# Patient Record
Sex: Female | Born: 1935 | Race: White | Hispanic: No | Smoking: Former smoker
Health system: Southern US, Community
[De-identification: ages and names within clinical notes are randomized; demographics above are authoritative.]

## PROBLEM LIST (undated history)

## (undated) ENCOUNTER — Emergency Department (HOSPITAL_COMMUNITY): Discharge status: Absent without leave | Payer: Self-pay

## (undated) ENCOUNTER — Emergency Department (HOSPITAL_COMMUNITY): Disposition: A | Discharge status: Home / Self Care | Payer: Self-pay

## (undated) DIAGNOSIS — J189 Pneumonia, unspecified organism: Secondary | ICD-10-CM

## (undated) DIAGNOSIS — M8580 Other specified disorders of bone density and structure, unspecified site: Secondary | ICD-10-CM

## (undated) DIAGNOSIS — D649 Anemia, unspecified: Secondary | ICD-10-CM

## (undated) DIAGNOSIS — I10 Essential (primary) hypertension: Secondary | ICD-10-CM

## (undated) DIAGNOSIS — E1151 Type 2 diabetes mellitus with diabetic peripheral angiopathy without gangrene: Secondary | ICD-10-CM

## (undated) DIAGNOSIS — I739 Peripheral vascular disease, unspecified: Secondary | ICD-10-CM

## (undated) DIAGNOSIS — M1990 Unspecified osteoarthritis, unspecified site: Secondary | ICD-10-CM

## (undated) DIAGNOSIS — E1165 Type 2 diabetes mellitus with hyperglycemia: Secondary | ICD-10-CM

## (undated) DIAGNOSIS — R0602 Shortness of breath: Secondary | ICD-10-CM

## (undated) DIAGNOSIS — K219 Gastro-esophageal reflux disease without esophagitis: Secondary | ICD-10-CM

## (undated) DIAGNOSIS — W19XXXA Unspecified fall, initial encounter: Secondary | ICD-10-CM

## (undated) DIAGNOSIS — Y92009 Unspecified place in unspecified non-institutional (private) residence as the place of occurrence of the external cause: Secondary | ICD-10-CM

## (undated) DIAGNOSIS — K759 Inflammatory liver disease, unspecified: Secondary | ICD-10-CM

## (undated) DIAGNOSIS — I509 Heart failure, unspecified: Secondary | ICD-10-CM

## (undated) HISTORY — DX: Anemia, unspecified: D64.9

## (undated) HISTORY — DX: Peripheral vascular disease, unspecified: I73.9

## (undated) HISTORY — DX: Unspecified fall, initial encounter: W19.XXXA

## (undated) HISTORY — DX: Other specified disorders of bone density and structure, unspecified site: M85.80

## (undated) HISTORY — DX: Gastro-esophageal reflux disease without esophagitis: K21.9

## (undated) HISTORY — DX: Unspecified osteoarthritis, unspecified site: M19.90

## (undated) HISTORY — DX: Unspecified place in unspecified non-institutional (private) residence as the place of occurrence of the external cause: Y92.009

## (undated) SURGERY — Surgical Case
Anesthesia: *Unknown

---

## 1992-06-27 HISTORY — PX: APPENDECTOMY: SHX54

## 2000-06-12 ENCOUNTER — Encounter: Admission: RE | Admit: 2000-06-12 | Discharge: 2000-06-12 | Payer: Self-pay | Admitting: Pulmonary Disease

## 2001-05-07 ENCOUNTER — Other Ambulatory Visit: Admission: RE | Admit: 2001-05-07 | Discharge: 2001-05-07 | Payer: Self-pay | Admitting: Obstetrics and Gynecology

## 2001-05-10 ENCOUNTER — Ambulatory Visit (HOSPITAL_COMMUNITY): Admission: RE | Admit: 2001-05-10 | Discharge: 2001-05-10 | Payer: Self-pay | Admitting: Obstetrics and Gynecology

## 2001-05-10 ENCOUNTER — Encounter: Payer: Self-pay | Admitting: Obstetrics and Gynecology

## 2001-05-18 ENCOUNTER — Encounter: Payer: Self-pay | Admitting: Obstetrics and Gynecology

## 2001-05-18 ENCOUNTER — Ambulatory Visit (HOSPITAL_COMMUNITY): Admission: RE | Admit: 2001-05-18 | Discharge: 2001-05-18 | Payer: Self-pay | Admitting: Obstetrics and Gynecology

## 2001-10-08 ENCOUNTER — Ambulatory Visit (HOSPITAL_COMMUNITY): Admission: RE | Admit: 2001-10-08 | Discharge: 2001-10-08 | Payer: Self-pay | Admitting: Orthopaedic Surgery

## 2001-10-08 ENCOUNTER — Encounter: Payer: Self-pay | Admitting: Orthopaedic Surgery

## 2001-10-18 ENCOUNTER — Other Ambulatory Visit: Admission: RE | Admit: 2001-10-18 | Discharge: 2001-10-18 | Payer: Self-pay | Admitting: Dermatology

## 2001-11-22 ENCOUNTER — Ambulatory Visit (HOSPITAL_COMMUNITY): Admission: RE | Admit: 2001-11-22 | Discharge: 2001-11-22 | Payer: Self-pay | Admitting: Pulmonary Disease

## 2002-05-13 ENCOUNTER — Ambulatory Visit (HOSPITAL_COMMUNITY): Admission: RE | Admit: 2002-05-13 | Discharge: 2002-05-13 | Payer: Self-pay | Admitting: Obstetrics and Gynecology

## 2002-05-13 ENCOUNTER — Encounter: Payer: Self-pay | Admitting: Obstetrics and Gynecology

## 2003-05-16 ENCOUNTER — Ambulatory Visit (HOSPITAL_COMMUNITY): Admission: RE | Admit: 2003-05-16 | Discharge: 2003-05-16 | Payer: Self-pay | Admitting: Internal Medicine

## 2003-07-22 ENCOUNTER — Ambulatory Visit (HOSPITAL_COMMUNITY): Admission: RE | Admit: 2003-07-22 | Discharge: 2003-07-22 | Payer: Self-pay | Admitting: Orthopaedic Surgery

## 2004-04-20 ENCOUNTER — Other Ambulatory Visit: Admission: RE | Admit: 2004-04-20 | Discharge: 2004-04-20 | Payer: Self-pay | Admitting: Ophthalmology

## 2004-06-18 ENCOUNTER — Ambulatory Visit (HOSPITAL_COMMUNITY): Admission: RE | Admit: 2004-06-18 | Discharge: 2004-06-18 | Payer: Self-pay | Admitting: Obstetrics and Gynecology

## 2005-02-23 ENCOUNTER — Ambulatory Visit (HOSPITAL_COMMUNITY): Admission: RE | Admit: 2005-02-23 | Discharge: 2005-02-23 | Payer: Self-pay

## 2005-06-29 ENCOUNTER — Ambulatory Visit (HOSPITAL_COMMUNITY): Admission: RE | Admit: 2005-06-29 | Discharge: 2005-06-29 | Payer: Self-pay | Admitting: Obstetrics and Gynecology

## 2006-01-09 ENCOUNTER — Ambulatory Visit (HOSPITAL_COMMUNITY): Admission: RE | Admit: 2006-01-09 | Discharge: 2006-01-09 | Payer: Self-pay | Admitting: *Deleted

## 2006-06-27 HISTORY — PX: EYE SURGERY: SHX253

## 2006-08-31 ENCOUNTER — Ambulatory Visit (HOSPITAL_COMMUNITY): Admission: RE | Admit: 2006-08-31 | Discharge: 2006-08-31 | Payer: Self-pay | Admitting: Obstetrics and Gynecology

## 2007-07-16 ENCOUNTER — Ambulatory Visit (HOSPITAL_COMMUNITY): Admission: RE | Admit: 2007-07-16 | Discharge: 2007-07-16 | Payer: Self-pay | Admitting: Ophthalmology

## 2007-07-23 ENCOUNTER — Other Ambulatory Visit: Admission: RE | Admit: 2007-07-23 | Discharge: 2007-07-23 | Payer: Self-pay | Admitting: Obstetrics and Gynecology

## 2009-02-23 ENCOUNTER — Ambulatory Visit (HOSPITAL_COMMUNITY): Admission: RE | Admit: 2009-02-23 | Discharge: 2009-02-23 | Payer: Self-pay | Admitting: Ophthalmology

## 2009-10-30 ENCOUNTER — Other Ambulatory Visit: Admission: RE | Admit: 2009-10-30 | Discharge: 2009-10-30 | Payer: Self-pay | Admitting: Obstetrics and Gynecology

## 2010-07-18 ENCOUNTER — Encounter: Payer: Self-pay | Admitting: Obstetrics and Gynecology

## 2010-09-30 ENCOUNTER — Other Ambulatory Visit (HOSPITAL_COMMUNITY): Payer: Self-pay | Admitting: Pulmonary Disease

## 2010-09-30 ENCOUNTER — Ambulatory Visit (HOSPITAL_COMMUNITY)
Admission: RE | Admit: 2010-09-30 | Discharge: 2010-09-30 | Disposition: A | Discharge status: Home / Self Care | Payer: Medicare Other | Source: Ambulatory Visit | Attending: Pulmonary Disease | Admitting: Pulmonary Disease

## 2010-09-30 DIAGNOSIS — R9389 Abnormal findings on diagnostic imaging of other specified body structures: Secondary | ICD-10-CM | POA: Insufficient documentation

## 2010-09-30 DIAGNOSIS — R1032 Left lower quadrant pain: Secondary | ICD-10-CM | POA: Insufficient documentation

## 2010-09-30 DIAGNOSIS — R109 Unspecified abdominal pain: Secondary | ICD-10-CM | POA: Insufficient documentation

## 2010-09-30 LAB — CREATININE, SERUM
Creatinine, Ser: 1.06 mg/dL (ref 0.4–1.2)
GFR calc Af Amer: >60 mL/min (ref >60)
GFR calc non Af Amer: 50 mL/min — ABNORMAL LOW (ref >60)

## 2010-09-30 MED ORDER — IOHEXOL 300 MG/ML  SOLN
100.0000 mL | Freq: Once | INTRAMUSCULAR | Status: AC | PRN
  Administered 2010-09-30: 100 mL via INTRAVENOUS

## 2010-10-01 ENCOUNTER — Other Ambulatory Visit (HOSPITAL_COMMUNITY): Payer: Self-pay

## 2010-10-02 LAB — HEMOGLOBIN AND HEMATOCRIT, BLOOD
HCT: 38.8 % (ref 36.0–46.0)
Hemoglobin: 13.6 g/dL (ref 12.0–15.0)

## 2010-10-02 LAB — BASIC METABOLIC PANEL
BUN: 17 mg/dL (ref 6–23)
CO2: 26 mEq/L (ref 19–32)
Calcium: 10.1 mg/dL (ref 8.4–10.5)
Chloride: 105 mEq/L (ref 96–112)
Creatinine, Ser: 0.87 mg/dL (ref 0.4–1.2)
GFR calc Af Amer: >60 mL/min (ref >60)
GFR calc non Af Amer: >60 mL/min (ref >60)
Glucose, Bld: 130 mg/dL — ABNORMAL HIGH (ref 70–99)
Potassium: 4.8 mEq/L (ref 3.5–5.1)
Sodium: 140 mEq/L (ref 135–145)

## 2010-10-02 LAB — GLUCOSE, CAPILLARY: Glucose-Capillary: 94 mg/dL (ref 70–99)

## 2010-12-02 ENCOUNTER — Other Ambulatory Visit (HOSPITAL_COMMUNITY): Payer: Self-pay | Admitting: Pulmonary Disease

## 2010-12-02 DIAGNOSIS — Z780 Asymptomatic menopausal state: Secondary | ICD-10-CM

## 2010-12-02 DIAGNOSIS — I739 Peripheral vascular disease, unspecified: Secondary | ICD-10-CM

## 2010-12-07 ENCOUNTER — Ambulatory Visit (HOSPITAL_COMMUNITY): Payer: Medicare Other

## 2010-12-07 ENCOUNTER — Ambulatory Visit (HOSPITAL_COMMUNITY)
Admission: RE | Admit: 2010-12-07 | Discharge: 2010-12-07 | Disposition: A | Discharge status: Home / Self Care | Payer: Medicare Other | Source: Ambulatory Visit | Attending: Pulmonary Disease | Admitting: Pulmonary Disease

## 2010-12-07 ENCOUNTER — Encounter (HOSPITAL_COMMUNITY): Payer: Self-pay

## 2010-12-07 DIAGNOSIS — Z780 Asymptomatic menopausal state: Secondary | ICD-10-CM

## 2010-12-07 DIAGNOSIS — M818 Other osteoporosis without current pathological fracture: Secondary | ICD-10-CM | POA: Insufficient documentation

## 2010-12-07 HISTORY — DX: Essential (primary) hypertension: I10

## 2010-12-13 ENCOUNTER — Ambulatory Visit (HOSPITAL_COMMUNITY)
Admission: RE | Admit: 2010-12-13 | Discharge: 2010-12-13 | Disposition: A | Discharge status: Home / Self Care | Payer: Medicare Other | Source: Ambulatory Visit | Attending: Pulmonary Disease | Admitting: Pulmonary Disease

## 2010-12-13 DIAGNOSIS — I743 Embolism and thrombosis of arteries of the lower extremities: Secondary | ICD-10-CM | POA: Insufficient documentation

## 2010-12-13 DIAGNOSIS — I739 Peripheral vascular disease, unspecified: Secondary | ICD-10-CM

## 2011-01-05 NOTE — Progress Notes (Signed)
[facility] [place]  Referred by: Dr. Kari Baars  Reason for referral: Possible L leg PAD  History of Present Illness  Tina [name] is a 77 y.o. female who presents with chief complaint: bilateral upper leg pain.  Onset of symptom occurred last few months.  Pain is described as aching in thighs, severity 2-4/10, and associated with ambulation and back pain.  Patient has attempted to treat this pain with OTC and rest.  The patient has no rest pain symptoms also and no leg wounds/ulcers.  She does not endorse claudication symptoms. Atherosclerotic risk factors include: DM, HTN, and Obesity.  Past Medical History  Diagnosis Date  . Diabetes mellitus   . Hypertension   . Osteopenia   . Peripheral vascular disease, unspecified   . GERD (gastroesophageal reflux disease)   . Arthritis     Past Surgical History  Procedure Date  . Appendectomy   . Eye surgery     History   Social History  . Marital Status: Widowed    Spouse Name: N/A    Number of Children: N/A  . Years of Education: N/A   Occupational History  . Not on file.   Social History Main Topics  . Smoking status: Former Smoker    Types: Cigarettes    Quit date: 01/06/1961  . Smokeless tobacco: Never Used  . Alcohol Use: 4.4 oz/week    4 Glasses of wine per week  . Drug Use: Not on file  . Sexually Active: Not on file   Other Topics Concern  . Not on file   Social History Narrative  . No narrative on file    Family History  Problem Relation Age of Onset  . Cancer Mother   . Heart disease Father     Current outpatient prescriptions:amLODipine (NORVASC) 10 MG tablet, Take 10 mg by mouth daily.  , Disp: , Rfl: ;  aspirin 325 MG tablet, Take 325 mg by mouth daily.  , Disp: , Rfl: ;  calcium-vitamin D (OSCAL WITH D) 500-200 MG-UNIT per tablet, Take 1 tablet by mouth daily.  , Disp: , Rfl: ;  colesevelam (WELCHOL) 625 MG tablet, Take 1,875 mg by mouth 2 (two) times daily with a meal.   , Disp: , Rfl:  ezetimibe (ZETIA) 10 MG tablet, Take 10 mg by mouth daily.  , Disp: , Rfl: ;  fish oil-omega-3 fatty acids 1000 MG capsule, Take 1 g by mouth daily.  , Disp: , Rfl: ;  lisinopril (PRINIVIL,ZESTRIL) 10 MG tablet, Take 10 mg by mouth daily.  , Disp: , Rfl: ;  Nutritional Supplements (NUTRITIONAL SUPPLEMENT PO), Take by mouth 2 (two) times daily.  , Disp: , Rfl: ;  omeprazole (PRILOSEC) 40 MG capsule, Take 40 mg by mouth daily.  , Disp: , Rfl:  pioglitazone-glimepiride (DUETACT) 30-4 MG per tablet, Take 1 tablet by mouth daily with breakfast.  , Disp: , Rfl: ;  rosuvastatin (CRESTOR) 20 MG tablet, Take 20 mg by mouth daily.  , Disp: , Rfl: ;  sitaGLIPtin (JANUVIA) 100 MG tablet, Take 100 mg by mouth daily.  , Disp: , Rfl: ;  traMADol (ULTRAM) 50 MG tablet, Take 50 mg by mouth every 6 (six) hours as needed.  , Disp: , Rfl:   Allergies as of 01/07/2011  . (Not on File)    Review of Systems (Positive items in bold and italic, otherwise negative)  General: Weight loss, Weight gain, Loss of appetite, Fever  Neurologic: Dizziness,  Blackouts, Headaches, Seizure  Ear/Nose/Throat: Change in eyesight, Change in hearing, Nose bleeds, Sore throat  Vascular: Pain in legs with walking, Pain in feet while lying flat, Non-healing ulcer, Stroke, "Mini stroke", Slurred speech, Temporary blindness, Blood clot in vein, Phlebitis  Pulmonary: Home oxygen, Productive cough, Bronchitis, Coughing up blood, Asthma, Wheezing  Musculoskeletal: Arthritis, Joint pain, Muscle pain  Cardiac: Chest pain, Chest tightness/pressure, Shortness of breath when lying flat, Palpitations, Heart murmur, Arrythmia, Atrial fibrillation  Hematologic: Bleeding problems, Clotting disorder, Anemia  Psychiatric:  Depression, Anxiety, Attention deficit disorder  Gastrointestinal:  Black stool, Blood in stool, Peptic ulcer disease, Reflux, Hiatal hernia, Trouble swallowing, Diarrhea, Constipation  Urinary:  Kidney disease,  Burning with urination, Frequent urination, Difficulty urinating  Skin: Ulcers, Rashes, Warts on feet  Physical Examination  Filed Vitals:   01/07/11 1511  BP: 128/80  Pulse: 91  Resp: 20    General: A&O x 3, WDWN, morbidly obese  Head: [place]/AT  Ear/Nose/Throat: Hearing grossly intact, nares w/o erythema or drainage, oropharynx w/o Erythema/Exudate  Eyes: PERRLA, EOMI  Neck: Supple, no nuchal rigidity, no palpable LAD  Pulmonary: Sym exp, good air movt, CTAB, no rales, rhonchi, & wheezing  Cardiac: RRR, Nl S1, S2, no Murmurs, rubs or gallops  Vascular: Vessel Right Left  Radial Palpable Palpable  Ulnary Palpable Palpable  Brachial Palpable Palpable  Carotid Palpable, without bruit Palpable, without bruit  Aorta Non-palpable N/A  Femoral Palpable Palpable  Popliteal Non-palpable Non-palpable  PT Non-Palpable Non-Palpable  DP Palpable Palpable   Gastrointestinal: soft, NTND, -G/R, - HSM, - masses, - CVAT B, morbidly obese  Musculoskeletal: M/S 5/5 throughout, Extremities without ischemic changes, distribution of fat c/w lipedema, no Kaposi-Stemmer sign  Neurologic: CN 2-12 intact, Pain and light touch intact in extremities, Motor exam as listed above  Psychiatric: Judgment intact, Mood & affect appropriatefor pt's clinical situation  Dermatologic: See M/S exam for extremity exam, no rashes otherwise noted  Lymph : No Cervical, Axillary, or Inguinal lymphadenopathy   Non-Invasive Vascular Imaging ABI will be ordered  Outside Studies/Documentation 15 pages of outside documents were reviewed including: BLE arterial waveforms..  Medical Decision Making  Tina [name] is a 77 y.o. female who presents with: B leg pain with back pain not consistent with vascular claudication.   In discussing the previus ABI and physiologic studies, I suspect the previous studies may be of limited value due to the inexperience of the operator subsequently I will be repeating the  studies  Also the previous waveforms on those studies are normal which is more consistent with the findings on exam  I discussed in depth with the patient the nature of atherosclerosis, and emphasized the importance of maximal medical management including strict control of blood pressure, blood glucose, and lipid levels, obtaining regular exercise, and cessation of smoking.  The patient is aware that without maximal medical management the underlying atherosclerotic disease process will progress, limiting the benefit of any interventions.  The patient will follow up in 2-4 weeks with the following studies: BLE ABI with waveforms.    Thank you for allowing [place] to participate in this patient's care.  Leonides Sake, MD [facility] [place] Office: [phone] Pager: [phone]

## 2011-01-07 ENCOUNTER — Encounter: Payer: Self-pay | Admitting: Vascular Surgery

## 2011-01-07 ENCOUNTER — Ambulatory Visit (INDEPENDENT_AMBULATORY_CARE_PROVIDER_SITE_OTHER): Payer: Medicare Other | Admitting: Vascular Surgery

## 2011-01-07 DIAGNOSIS — E669 Obesity, unspecified: Secondary | ICD-10-CM

## 2011-01-07 DIAGNOSIS — M79604 Pain in right leg: Secondary | ICD-10-CM | POA: Insufficient documentation

## 2011-01-07 DIAGNOSIS — M79609 Pain in unspecified limb: Secondary | ICD-10-CM

## 2011-01-07 DIAGNOSIS — M79605 Pain in left leg: Secondary | ICD-10-CM | POA: Insufficient documentation

## 2011-01-07 NOTE — Progress Notes (Signed)
New  PAD

## 2011-01-26 NOTE — Progress Notes (Addendum)
[facility]   Established Intermittent Claudication  History of Present Illness  Tina [name] is a 77 y.o. female who presents with chief complaint: follow on ABI.  The patient's symptoms have not progressed.  The patient's symptoms are: unchanged from previous: thigh aching not consistent with PAD.   The patient's treatment regimen currently included: maximal medical management.  Past Medical History, Past Surgical History, Social History, Family History, Medications, Allergies, and Review of Systems are unchanged from previous evaluation on 01/07/11.  Physical Examination  Filed Vitals:   01/28/11 1534  BP: 138/75  Pulse: 87   General: A&O x 3, WDWN, morbidly obese   Pulmonary: Sym exp, good air movt, CTAB, no rales, rhonchi, & wheezing   Cardiac: RRR, Nl S1, S2, no Murmurs, rubs or gallops   Vascular:  Vessel  Right  Left   Radial  Palpable  Palpable   Ulnary  Palpable  Palpable   Brachial  Palpable  Palpable   Carotid  Palpable, without bruit  Palpable, without bruit   Aorta  Non-palpable  N/A   Femoral  Palpable  Palpable   Popliteal  Non-palpable  Non-palpable   PT  Non-Palpable  Non-Palpable   DP  Palpable  Palpable    Gastrointestinal: soft, NTND, -G/R, - HSM, - masses, - CVAT B, morbidly obese   Musculoskeletal: M/S 5/5 throughout, Extremities without ischemic changes,  Neurologic: Pain and light touch intact in extremities, Motor exam as listed above  Non-Invasive Vascular Imaging ABI (Date: 01/28/11)  RLE: 1.29, Triphasic Pt, Biphasic AT  LLE: 1.26, Biphasic PT, Bi[hasic AT  Medical Decision Making  Tina [name] is a 77 y.o. female who presents with: B thigh pain and ABI not consistent with PAD  Based on the patient's vascular studies and examination, I suspect this patient's pain is not vasogenic in origin  However, she is at very high risk for developing PAD.  In fact, the ABI > 1.0 may represent some degree of  medial calcification in both legs.  I recommend annual ABI to interrogate both legs.  She will follow up with [place] annually to obtain such.  I discussed in depth with the patient the nature of atherosclerosis, and emphasized the importance of maximal medical management including strict control of blood pressure, blood glucose, and lipid levels, obtaining regular exercise, and cessation of smoking.  The patient is aware that without maximal medical management the underlying atherosclerotic disease process will progress, limiting the benefit of any interventions.  Thank you for allowing [place] to participate in this patient's care.  Leonides Sake, MD [facility] [place] Office: [phone] Pager: [phone]

## 2011-01-28 ENCOUNTER — Encounter (INDEPENDENT_AMBULATORY_CARE_PROVIDER_SITE_OTHER): Payer: Medicare Other

## 2011-01-28 ENCOUNTER — Ambulatory Visit (INDEPENDENT_AMBULATORY_CARE_PROVIDER_SITE_OTHER): Payer: Medicare Other | Admitting: Vascular Surgery

## 2011-01-28 ENCOUNTER — Encounter: Payer: Self-pay | Admitting: Vascular Surgery

## 2011-01-28 VITALS — BP 138/75 | HR 87 | Ht 66.0 in | Wt 240.0 lb

## 2011-01-28 DIAGNOSIS — M79609 Pain in unspecified limb: Secondary | ICD-10-CM

## 2011-01-28 DIAGNOSIS — M79604 Pain in right leg: Secondary | ICD-10-CM

## 2011-03-17 LAB — HEMOGLOBIN AND HEMATOCRIT, BLOOD
HCT: 41.2
Hemoglobin: 13.7

## 2011-03-17 LAB — BASIC METABOLIC PANEL
BUN: 15
CO2: 23
Calcium: 9.8
Chloride: 107
Creatinine, Ser: 0.86
GFR calc Af Amer: >60
GFR calc non Af Amer: >60
Glucose, Bld: 150 — ABNORMAL HIGH
Potassium: 5
Sodium: 140

## 2012-02-02 ENCOUNTER — Encounter: Payer: Self-pay | Admitting: Vascular Surgery

## 2012-02-03 ENCOUNTER — Ambulatory Visit: Payer: Medicare Other | Admitting: Vascular Surgery

## 2012-02-16 ENCOUNTER — Other Ambulatory Visit: Payer: Self-pay | Admitting: *Deleted

## 2012-02-16 ENCOUNTER — Encounter: Payer: Self-pay | Admitting: Vascular Surgery

## 2012-02-16 DIAGNOSIS — I70219 Atherosclerosis of native arteries of extremities with intermittent claudication, unspecified extremity: Secondary | ICD-10-CM

## 2012-02-17 ENCOUNTER — Ambulatory Visit (INDEPENDENT_AMBULATORY_CARE_PROVIDER_SITE_OTHER): Payer: Medicare Other | Admitting: Vascular Surgery

## 2012-02-17 ENCOUNTER — Encounter: Payer: Self-pay | Admitting: Vascular Surgery

## 2012-02-17 VITALS — BP 125/61 | HR 97 | Resp 16 | Ht 63.5 in | Wt 247.0 lb

## 2012-02-17 DIAGNOSIS — I70219 Atherosclerosis of native arteries of extremities with intermittent claudication, unspecified extremity: Secondary | ICD-10-CM

## 2012-02-17 DIAGNOSIS — I739 Peripheral vascular disease, unspecified: Secondary | ICD-10-CM

## 2012-02-17 DIAGNOSIS — M79609 Pain in unspecified limb: Secondary | ICD-10-CM | POA: Insufficient documentation

## 2012-02-17 NOTE — Progress Notes (Signed)
Ankle and toe brachial indices performed @ [facility] 02/17/2012

## 2012-02-17 NOTE — Progress Notes (Signed)
[facility] [place]  Established Intermittent Claudication  History of Present Illness  Tina [name] is a 78 y.o. (03-May-1931) female who presents with chief complaint: back pain.  The patient's symptoms previous thigh aching have improved after spinal injections.  The patient's symptoms are: aching in legs with ambulation.  This is improved with exercise in water.  The patient's treatment regimen currently included: maximal medical management.  Past Medical History, Past Surgical History, Social History, Family History, Medications, Allergies, and Review of Systems are unchanged from previous evaluation on 01/28/11.  Physical Examination  Filed Vitals:   02/17/12 1518  BP: 125/61  Pulse: 97  Resp: 16  Height: 5' 3.5" (1.613 m)  Weight: 247 lb (112.038 kg)  SpO2: 99%   Body mass index is 43.07 kg/(m^2).  General: A&O x 3, WDWN, morbidly obese  Pulmonary: Sym exp, good air movt, CTAB, no rales, rhonchi, & wheezing  Cardiac: RRR, Nl S1, S2, no Murmurs, rubs or gallops  Vascular: Vessel Right Left  Radial Palpable Palpable  Ulnar Palpable Palpable  Brachial Palpable Palpable  Carotid Palpable, without bruit Palpable, without bruit  Aorta Not palpable due to obesity N/A  Femoral Palpable Palpable  Popliteal Not palpable Not palpable  PT Faintly Palpable Faintly Palpable  DP Not Palpable Not Palpable   Musculoskeletal: M/S 5/5 throughout , Extremities without ischemic changes , B spider veins and varicosities, some darkened areas on plantar surface not c/w ischemia  Neurologic: Pain and light touch intact in extremities , Motor exam as listed above  Non-Invasive Vascular Imaging ABI (Date: 02/17/12)  RLE: 1.04, PT: biphasic, DP: triphasic, DBI: 0.51  LLE: 0.99, PT: biphasic, DP: monophasic, DBI: 0.51  Medical Decision Making  Tina [name] is a 78 y.o. female who presents with: BLE PAD.  I suspect that this patient's musculoskeletal  limitation probably prevent her from manifesting any intermittent claudication.  The ABI likely reflect some degree of medial calcification.  Strangely her waveforms are completely incongruent with her ABI.  Her right leg waveforms appear relatively normal though the DP on has a ABI of 0.52.  The waveforms in the L leg are consistent with at least moderate PAD.  In this patient with non-lifestyle limiting PAD, it is hard to justify pursuing a diagnostic angiogram.  I would continue with maximal medical management.   I also discussed with her modifying the walking plan concept for an aqua aerobics: exercising until she became sx and then recovering and then continuing.  I discussed in depth with the patient the nature of atherosclerosis, and emphasized the importance of maximal medical management including strict control of blood pressure, blood glucose, and lipid levels, antiplatelet agents, obtaining regular exercise, and cessation of smoking.  The patient is aware that without maximal medical management the underlying atherosclerotic disease process will progress, limiting the benefit of any interventions.  She will follow up in 6 months with repeat ABI.  Thank you for allowing [place] to participate in this patient's care.  Leonides Sake, MD [facility] [place] Office: [phone] Pager: [phone]  02/17/2012, 4:45 PM

## 2012-08-16 ENCOUNTER — Encounter: Payer: Self-pay | Admitting: Neurosurgery

## 2012-08-17 ENCOUNTER — Ambulatory Visit: Payer: Medicare Other | Admitting: Neurosurgery

## 2012-08-23 ENCOUNTER — Encounter: Payer: Self-pay | Admitting: Neurosurgery

## 2012-08-24 ENCOUNTER — Ambulatory Visit: Payer: Medicare Other | Admitting: Vascular Surgery

## 2012-08-24 ENCOUNTER — Ambulatory Visit (INDEPENDENT_AMBULATORY_CARE_PROVIDER_SITE_OTHER): Payer: Medicare Other | Admitting: Neurosurgery

## 2012-08-24 ENCOUNTER — Encounter: Payer: Self-pay | Admitting: Neurosurgery

## 2012-08-24 ENCOUNTER — Other Ambulatory Visit: Payer: Self-pay | Admitting: *Deleted

## 2012-08-24 ENCOUNTER — Encounter (INDEPENDENT_AMBULATORY_CARE_PROVIDER_SITE_OTHER): Payer: Medicare Other | Admitting: Vascular Surgery

## 2012-08-24 VITALS — BP 145/72 | HR 86 | Resp 16 | Ht 63.5 in | Wt 240.0 lb

## 2012-08-24 DIAGNOSIS — I70219 Atherosclerosis of native arteries of extremities with intermittent claudication, unspecified extremity: Secondary | ICD-10-CM

## 2012-08-24 NOTE — Progress Notes (Signed)
[facility] [place] PAD/PVD Office Note  CC: PAD surveillance Referring Physician: Imogene Burn  History of Present Illness: 79 year old female patient of Dr. Imogene Burn with no history of lower extremity intervention. The patient denies claudication or rest pain. The patient states she does have lumbar pain which limits her activity.  Past Medical History  Diagnosis Date  . Diabetes mellitus   . Hypertension   . Osteopenia   . Peripheral vascular disease, unspecified   . GERD (gastroesophageal reflux disease)   . Arthritis   . Anemia     ROS: [x]  Positive   [ ]  Denies    General: [ ]  Weight loss, [ ]  Fever, [ ]  chills Neurologic: [ ]  Dizziness, [ ]  Blackouts, [ ]  Seizure [ ]  Stroke, [ ]  "Mini stroke", [ ]  Slurred speech, [ ]  Temporary blindness; [ ]  weakness in arms or legs, [ ]  Hoarseness Cardiac: [ ]  Chest pain/pressure, [ ]  Shortness of breath at rest [ ]  Shortness of breath with exertion, [ ]  Atrial fibrillation or irregular heartbeat Vascular: [ ]  Pain in legs with walking, [ ]  Pain in legs at rest, [ ]  Pain in legs at night,  [ ]  Non-healing ulcer, [ ]  Blood clot in vein/DVT,   Pulmonary: [ ]  Home oxygen, [ ]  Productive cough, [ ]  Coughing up blood, [ ]  Asthma,  [ ]  Wheezing Musculoskeletal:  [ ]  Arthritis, [ ]  Low back pain, [ ]  Joint pain Hematologic: [ ]  Easy Bruising, [ ]  Anemia; [ ]  Hepatitis Gastrointestinal: [ ]  Blood in stool, [ ]  Gastroesophageal Reflux/heartburn, [ ]  Trouble swallowing Urinary: [ ]  chronic Kidney disease, [ ]  on HD - [ ]  MWF or [ ]  TTHS, [ ]  Burning with urination, [ ]  Difficulty urinating Skin: [ ]  Rashes, [ ]  Wounds Psychological: [ ]  Anxiety, [ ]  Depression   Social History History  Substance Use Topics  . Smoking status: Former Smoker    Types: Cigarettes    Quit date: 01/06/1961  . Smokeless tobacco: Never Used  . Alcohol Use: 4.4 oz/week    4 Glasses of wine per week    Family History Family History  Problem Relation  Age of Onset  . Cancer Mother   . Hypertension Mother   . Heart disease Father     before age 50  . Heart attack Father   . Hyperlipidemia Son     No Known Allergies  Current Outpatient Prescriptions  Medication Sig Dispense Refill  . amLODipine (NORVASC) 10 MG tablet Take 10 mg by mouth daily.        Marland Kitchen aspirin 325 MG tablet Take 325 mg by mouth daily.        . calcium carbonate (OS-CAL - DOSED IN MG OF ELEMENTAL CALCIUM) 1250 MG tablet Take 1 tablet by mouth daily.      . calcium-vitamin D (OSCAL WITH D) 500-200 MG-UNIT per tablet Take 1 tablet by mouth daily.        . Cholecalciferol (VITAMIN D PO) Take by mouth daily.      Marland Kitchen CINNAMON PO Take by mouth daily.        . colesevelam (WELCHOL) 625 MG tablet Take 1,875 mg by mouth 2 (two) times daily with a meal.        . ezetimibe (ZETIA) 10 MG tablet Take 10 mg by mouth daily.        . fish oil-omega-3 fatty acids 1000 MG capsule Take 1 g by mouth daily.        Marland Kitchen  lisinopril (PRINIVIL,ZESTRIL) 10 MG tablet Take 10 mg by mouth daily.        . Nutritional Supplements (NUTRITIONAL SUPPLEMENT PO) Take by mouth 2 (two) times daily.        Marland Kitchen omeprazole (PRILOSEC) 40 MG capsule Take 40 mg by mouth daily.        . pioglitazone-glimepiride (DUETACT) 30-4 MG per tablet Take 1 tablet by mouth daily with breakfast.        . rosuvastatin (CRESTOR) 20 MG tablet Take 20 mg by mouth daily.        . sitaGLIPtin (JANUVIA) 100 MG tablet Take 100 mg by mouth daily.        . traMADol (ULTRAM) 50 MG tablet Take 50 mg by mouth every 6 (six) hours as needed.        . vitamin C (ASCORBIC ACID) 500 MG tablet Take 500 mg by mouth daily.       No current facility-administered medications for this visit.    Physical Examination  Filed Vitals:   08/24/12 1350  BP: 145/72  Pulse: 86  Resp: 16    Body mass index is 41.84 kg/(m^2).  General:  WDWN in NAD Gait: Normal HEENT: WNL Eyes: Pupils equal Pulmonary: normal non-labored breathing , without Rales,  rhonchi,  wheezing Cardiac: RRR, without  Murmurs, rubs or gallops; No carotid bruits Abdomen: soft, NT, no masses Skin: no rashes, ulcers noted Vascular Exam/Pulses: Palpable PT and DP bilaterally  Extremities without ischemic changes, no Gangrene , no cellulitis; no open wounds;  Musculoskeletal: no muscle wasting or atrophy  Neurologic: A&O X 3; Appropriate Affect ; SENSATION: normal; MOTOR FUNCTION:  moving all extremities equally. Speech is fluent/normal  Non-Invasive Vascular Imaging: ABIs today are 1.14 triphasic on the right, 1.03 and triphasic on the left  ASSESSMENT/PLAN: Asymptomatic patient that'll followup in one year with repeat ABIs. The patient's questions were encouraged and answered, she is in agreement with this plan.  Lauree Chandler ANP  Clinic M.D.: Hart [place] on call

## 2013-03-14 ENCOUNTER — Other Ambulatory Visit (HOSPITAL_COMMUNITY): Payer: Self-pay | Admitting: Physical Medicine and Rehabilitation

## 2013-03-14 DIAGNOSIS — M545 Low back pain: Secondary | ICD-10-CM

## 2013-03-18 ENCOUNTER — Ambulatory Visit (HOSPITAL_COMMUNITY)
Admission: RE | Admit: 2013-03-18 | Discharge: 2013-03-18 | Disposition: A | Discharge status: Home / Self Care | Payer: Medicare Other | Source: Ambulatory Visit | Attending: Physical Medicine and Rehabilitation | Admitting: Physical Medicine and Rehabilitation

## 2013-03-18 DIAGNOSIS — M545 Low back pain: Secondary | ICD-10-CM

## 2013-03-19 ENCOUNTER — Ambulatory Visit (HOSPITAL_COMMUNITY)
Admission: RE | Admit: 2013-03-19 | Discharge: 2013-03-19 | Disposition: A | Discharge status: Home / Self Care | Payer: Medicare Other | Source: Ambulatory Visit | Attending: Physical Medicine and Rehabilitation | Admitting: Physical Medicine and Rehabilitation

## 2013-03-19 DIAGNOSIS — M5126 Other intervertebral disc displacement, lumbar region: Secondary | ICD-10-CM | POA: Insufficient documentation

## 2013-03-19 DIAGNOSIS — M545 Low back pain, unspecified: Secondary | ICD-10-CM | POA: Insufficient documentation

## 2013-03-19 DIAGNOSIS — M5380 Other specified dorsopathies, site unspecified: Secondary | ICD-10-CM | POA: Insufficient documentation

## 2013-07-03 ENCOUNTER — Other Ambulatory Visit: Payer: Self-pay | Admitting: Neurosurgery

## 2013-07-04 ENCOUNTER — Encounter (HOSPITAL_COMMUNITY): Payer: Self-pay | Admitting: Pharmacy Technician

## 2013-07-05 ENCOUNTER — Encounter (HOSPITAL_COMMUNITY): Payer: Self-pay

## 2013-07-05 ENCOUNTER — Encounter (HOSPITAL_COMMUNITY)
Admission: RE | Admit: 2013-07-05 | Discharge: 2013-07-05 | Disposition: A | Discharge status: Home / Self Care | Payer: Medicare Other | Source: Ambulatory Visit | Attending: Neurosurgery | Admitting: Neurosurgery

## 2013-07-05 ENCOUNTER — Ambulatory Visit (HOSPITAL_COMMUNITY)
Admission: RE | Admit: 2013-07-05 | Discharge: 2013-07-05 | Disposition: A | Discharge status: Home / Self Care | Payer: Medicare Other | Source: Ambulatory Visit | Attending: Anesthesiology | Admitting: Anesthesiology

## 2013-07-05 DIAGNOSIS — Z01818 Encounter for other preprocedural examination: Secondary | ICD-10-CM | POA: Insufficient documentation

## 2013-07-05 DIAGNOSIS — Z01812 Encounter for preprocedural laboratory examination: Secondary | ICD-10-CM | POA: Insufficient documentation

## 2013-07-05 DIAGNOSIS — R918 Other nonspecific abnormal finding of lung field: Secondary | ICD-10-CM | POA: Insufficient documentation

## 2013-07-05 DIAGNOSIS — M48061 Spinal stenosis, lumbar region without neurogenic claudication: Secondary | ICD-10-CM | POA: Insufficient documentation

## 2013-07-05 DIAGNOSIS — I700 Atherosclerosis of aorta: Secondary | ICD-10-CM | POA: Insufficient documentation

## 2013-07-05 DIAGNOSIS — Z01810 Encounter for preprocedural cardiovascular examination: Secondary | ICD-10-CM | POA: Insufficient documentation

## 2013-07-05 HISTORY — DX: Pneumonia, unspecified organism: J18.9

## 2013-07-05 HISTORY — DX: Shortness of breath: R06.02

## 2013-07-05 HISTORY — DX: Inflammatory liver disease, unspecified: K75.9

## 2013-07-05 LAB — CBC
HCT: 42.1 % (ref 36.0–46.0)
Hemoglobin: 14 g/dL (ref 12.0–15.0)
MCH: 33.9 pg (ref 26.0–34.0)
MCHC: 33.3 g/dL (ref 30.0–36.0)
MCV: 101.9 fL — AB (ref 78.0–100.0)
Platelets: 279 10*3/uL (ref 150–400)
RBC: 4.13 MIL/uL (ref 3.87–5.11)
RDW: 14.2 % (ref 11.5–15.5)
WBC: 9.1 10*3/uL (ref 4.0–10.5)

## 2013-07-05 LAB — COMPREHENSIVE METABOLIC PANEL
ALT: 19 U/L (ref 0–35)
AST: 22 U/L (ref 0–37)
Albumin: 3.6 g/dL (ref 3.5–5.2)
Alkaline Phosphatase: 63 U/L (ref 39–117)
BUN: 22 mg/dL (ref 6–23)
CALCIUM: 9.6 mg/dL (ref 8.4–10.5)
CO2: 20 meq/L (ref 19–32)
CREATININE: 1.07 mg/dL (ref 0.50–1.10)
Chloride: 105 mEq/L (ref 96–112)
GFR, EST AFRICAN AMERICAN: 55 mL/min — AB (ref >90)
GFR, EST NON AFRICAN AMERICAN: 48 mL/min — AB (ref >90)
GLUCOSE: 158 mg/dL — AB (ref 70–99)
Potassium: 4.6 mEq/L (ref 3.7–5.3)
Sodium: 141 mEq/L (ref 137–147)
TOTAL PROTEIN: 7.1 g/dL (ref 6.0–8.3)
Total Bilirubin: 0.3 mg/dL (ref 0.3–1.2)

## 2013-07-05 LAB — SURGICAL PCR SCREEN
MRSA, PCR: NEGATIVE
Staphylococcus aureus: POSITIVE — AB

## 2013-07-05 NOTE — Pre-Procedure Instructions (Addendum)
Tina CoopShirley A Key  07/05/2013   Your procedure is scheduled on: Tuesday, Jan. 13th             [facility](Free Valet Parking is available)   [facility]  2 * 3 at 7:00 AM.             This time is per your surgeon's request.   Call this number if you have problems the morning of surgery: [phone]   Remember:   Do not eat food or drink liquids after midnight Monday.   Take these medicines the morning of surgery with A SIP OF WATER: Norvasc, Prilosec          STOP all herbel meds, nsaids (aleve,naproxen,advil,ibuprofen) today including aspirin ,vitamins   Do not wear jewelry, make-up or nail polish.  Do not wear lotions, powders, or perfumes. You may NOT wear deodorant.  Do not shave 48 hours prior to surgery.    Do not bring valuables to the hospital.  [facility] is not responsible for any belongings or valuables.               Contacts, dentures or bridgework may not be worn into surgery.   Leave suitcase in the car. After surgery it may be brought to your room.  For patients admitted to the hospital, discharge time is determined by your treatment team.              Name and phone number of your driver:    Special Instructions: Shower using CHG 2 nights before surgery and the night before surgery.  If you shower the day of surgery use CHG.  Use special wash - you have one bottle of CHG for all showers.  You should use approximately 1/3 of the bottle for each shower.   Please read over the following fact sheets that you were given: Pain Booklet and Surgical Site Infection Prevention

## 2013-07-05 NOTE — Progress Notes (Signed)
req'd ekg, notes from dr Job Foundshawkins pcp [place]

## 2013-07-08 MED ORDER — CEFAZOLIN SODIUM-DEXTROSE 2-3 GM-% IV SOLR
2.0000 g | INTRAVENOUS | Status: AC
  Administered 2013-07-09: 2 g via INTRAVENOUS

## 2013-07-08 NOTE — Progress Notes (Signed)
Contacted Dr. Juanetta GoslingHawkins office for old EKG.  Medical records states none available.  Last OV notes obtained and on chart.

## 2013-07-09 ENCOUNTER — Inpatient Hospital Stay (HOSPITAL_COMMUNITY)
Admission: RE | Admit: 2013-07-09 | Discharge: 2013-07-16 | DRG: 520 | Disposition: A | Discharge status: Skilled Nursing Facility | Payer: Medicare Other | Source: Ambulatory Visit | Attending: Neurosurgery | Admitting: Neurosurgery

## 2013-07-09 ENCOUNTER — Encounter (HOSPITAL_COMMUNITY)
Admission: RE | Disposition: A | Discharge status: Skilled Nursing Facility | Payer: Self-pay | Source: Ambulatory Visit | Attending: Neurosurgery

## 2013-07-09 ENCOUNTER — Inpatient Hospital Stay (HOSPITAL_COMMUNITY): Payer: Medicare Other

## 2013-07-09 ENCOUNTER — Encounter (HOSPITAL_COMMUNITY): Payer: Medicare Other | Admitting: Certified Registered"

## 2013-07-09 ENCOUNTER — Inpatient Hospital Stay (HOSPITAL_COMMUNITY): Payer: Medicare Other | Admitting: Certified Registered"

## 2013-07-09 ENCOUNTER — Encounter (HOSPITAL_COMMUNITY): Payer: Self-pay | Admitting: Certified Registered"

## 2013-07-09 DIAGNOSIS — K219 Gastro-esophageal reflux disease without esophagitis: Secondary | ICD-10-CM | POA: Diagnosis present

## 2013-07-09 DIAGNOSIS — E119 Type 2 diabetes mellitus without complications: Secondary | ICD-10-CM | POA: Diagnosis present

## 2013-07-09 DIAGNOSIS — E1149 Type 2 diabetes mellitus with other diabetic neurological complication: Secondary | ICD-10-CM | POA: Diagnosis present

## 2013-07-09 DIAGNOSIS — I739 Peripheral vascular disease, unspecified: Secondary | ICD-10-CM | POA: Diagnosis present

## 2013-07-09 DIAGNOSIS — Z79899 Other long term (current) drug therapy: Secondary | ICD-10-CM

## 2013-07-09 DIAGNOSIS — M48062 Spinal stenosis, lumbar region with neurogenic claudication: Secondary | ICD-10-CM | POA: Diagnosis present

## 2013-07-09 DIAGNOSIS — M949 Disorder of cartilage, unspecified: Secondary | ICD-10-CM

## 2013-07-09 DIAGNOSIS — M5126 Other intervertebral disc displacement, lumbar region: Principal | ICD-10-CM | POA: Diagnosis present

## 2013-07-09 DIAGNOSIS — M899 Disorder of bone, unspecified: Secondary | ICD-10-CM | POA: Diagnosis present

## 2013-07-09 DIAGNOSIS — E1142 Type 2 diabetes mellitus with diabetic polyneuropathy: Secondary | ICD-10-CM | POA: Diagnosis present

## 2013-07-09 DIAGNOSIS — Z87891 Personal history of nicotine dependence: Secondary | ICD-10-CM

## 2013-07-09 DIAGNOSIS — K5900 Constipation, unspecified: Secondary | ICD-10-CM | POA: Diagnosis not present

## 2013-07-09 DIAGNOSIS — K759 Inflammatory liver disease, unspecified: Secondary | ICD-10-CM | POA: Diagnosis present

## 2013-07-09 DIAGNOSIS — I10 Essential (primary) hypertension: Secondary | ICD-10-CM | POA: Diagnosis present

## 2013-07-09 HISTORY — PX: LUMBAR LAMINECTOMY/DECOMPRESSION MICRODISCECTOMY: SHX5026

## 2013-07-09 LAB — GLUCOSE, CAPILLARY
GLUCOSE-CAPILLARY: 112 mg/dL — AB (ref 70–99)
GLUCOSE-CAPILLARY: 195 mg/dL — AB (ref 70–99)
Glucose-Capillary: 112 mg/dL — ABNORMAL HIGH (ref 70–99)

## 2013-07-09 SURGERY — LUMBAR LAMINECTOMY/DECOMPRESSION MICRODISCECTOMY 3 LEVELS
Anesthesia: General

## 2013-07-09 MED ORDER — ACETAMINOPHEN 325 MG PO TABS
650.0000 mg | ORAL_TABLET | ORAL | Status: DC | PRN
  Filled 2013-07-09: qty 2

## 2013-07-09 MED ORDER — PROPOFOL 10 MG/ML IV BOLUS
INTRAVENOUS | Status: DC | PRN
  Administered 2013-07-09: 200 mg via INTRAVENOUS

## 2013-07-09 MED ORDER — PHENYLEPHRINE HCL 10 MG/ML IJ SOLN
10.0000 mg | INTRAMUSCULAR | Status: DC | PRN
  Administered 2013-07-09: 15 ug/min via INTRAVENOUS

## 2013-07-09 MED ORDER — BUPIVACAINE LIPOSOME 1.3 % IJ SUSP
20.0000 mL | INTRAMUSCULAR | Status: AC
  Filled 2013-07-09: qty 20

## 2013-07-09 MED ORDER — PIOGLITAZONE HCL 30 MG PO TABS
30.0000 mg | ORAL_TABLET | Freq: Every day | ORAL | Status: DC
  Administered 2013-07-10 – 2013-07-16 (×7): 30 mg via ORAL
  Filled 2013-07-09 (×9): qty 1

## 2013-07-09 MED ORDER — GLYCOPYRROLATE 0.2 MG/ML IJ SOLN
INTRAMUSCULAR | Status: DC | PRN
  Administered 2013-07-09: 0.6 mg via INTRAVENOUS

## 2013-07-09 MED ORDER — PIOGLITAZONE HCL-GLIMEPIRIDE 30-4 MG PO TABS: 1.0000 | ORAL_TABLET | Freq: Every day | ORAL | Status: DC

## 2013-07-09 MED ORDER — LINAGLIPTIN 5 MG PO TABS
5.0000 mg | ORAL_TABLET | Freq: Every day | ORAL | Status: DC
  Administered 2013-07-09 – 2013-07-16 (×8): 5 mg via ORAL
  Filled 2013-07-09 (×8): qty 1

## 2013-07-09 MED ORDER — HYDROMORPHONE HCL PF 1 MG/ML IJ SOLN
INTRAMUSCULAR | Status: AC
  Administered 2013-07-09: 0.5 mg via INTRAVENOUS
  Filled 2013-07-09: qty 1

## 2013-07-09 MED ORDER — GLIMEPIRIDE 4 MG PO TABS
4.0000 mg | ORAL_TABLET | Freq: Every day | ORAL | Status: DC
  Administered 2013-07-10 – 2013-07-16 (×7): 4 mg via ORAL
  Filled 2013-07-09 (×8): qty 1

## 2013-07-09 MED ORDER — ACETAMINOPHEN 650 MG RE SUPP: 650.0000 mg | RECTAL | Status: DC | PRN

## 2013-07-09 MED ORDER — LISINOPRIL 10 MG PO TABS
10.0000 mg | ORAL_TABLET | Freq: Every day | ORAL | Status: DC
  Administered 2013-07-09 – 2013-07-16 (×8): 10 mg via ORAL
  Filled 2013-07-09 (×8): qty 1

## 2013-07-09 MED ORDER — ONDANSETRON HCL 4 MG/2ML IJ SOLN
4.0000 mg | Freq: Once | INTRAMUSCULAR | Status: AC | PRN
  Administered 2013-07-09: 4 mg via INTRAVENOUS

## 2013-07-09 MED ORDER — SODIUM CHLORIDE 0.9 % IJ SOLN: 3.0000 mL | INTRAMUSCULAR | Status: DC | PRN

## 2013-07-09 MED ORDER — SODIUM CHLORIDE 0.9 % IJ SOLN
3.0000 mL | Freq: Two times a day (BID) | INTRAMUSCULAR | Status: DC
  Administered 2013-07-09 – 2013-07-16 (×9): 3 mL via INTRAVENOUS

## 2013-07-09 MED ORDER — AMLODIPINE BESYLATE 10 MG PO TABS
10.0000 mg | ORAL_TABLET | Freq: Every day | ORAL | Status: DC
  Administered 2013-07-10 – 2013-07-16 (×6): 10 mg via ORAL
  Filled 2013-07-09 (×7): qty 1

## 2013-07-09 MED ORDER — MORPHINE SULFATE 4 MG/ML IJ SOLN
INTRAMUSCULAR | Status: AC
  Filled 2013-07-09: qty 1

## 2013-07-09 MED ORDER — COLESEVELAM HCL 625 MG PO TABS
1875.0000 mg | ORAL_TABLET | Freq: Two times a day (BID) | ORAL | Status: DC
  Administered 2013-07-09 – 2013-07-16 (×13): 1875 mg via ORAL
  Filled 2013-07-09 (×17): qty 3

## 2013-07-09 MED ORDER — SODIUM CHLORIDE 0.9 % IV SOLN: 250.0000 mL | INTRAVENOUS | Status: DC

## 2013-07-09 MED ORDER — LACTATED RINGERS IV SOLN
INTRAVENOUS | Status: DC
  Administered 2013-07-09 (×2): via INTRAVENOUS

## 2013-07-09 MED ORDER — HYDROMORPHONE HCL PF 1 MG/ML IJ SOLN
0.2500 mg | INTRAMUSCULAR | Status: DC | PRN
  Administered 2013-07-09 (×4): 0.5 mg via INTRAVENOUS

## 2013-07-09 MED ORDER — EZETIMIBE 10 MG PO TABS
10.0000 mg | ORAL_TABLET | Freq: Every day | ORAL | Status: DC
  Administered 2013-07-09 – 2013-07-16 (×8): 10 mg via ORAL
  Filled 2013-07-09 (×8): qty 1

## 2013-07-09 MED ORDER — ATORVASTATIN CALCIUM 20 MG PO TABS
20.0000 mg | ORAL_TABLET | Freq: Every day | ORAL | Status: DC
  Administered 2013-07-09 – 2013-07-15 (×7): 20 mg via ORAL
  Filled 2013-07-09 (×8): qty 1

## 2013-07-09 MED ORDER — ONDANSETRON HCL 4 MG/2ML IJ SOLN
4.0000 mg | INTRAMUSCULAR | Status: DC | PRN
  Administered 2013-07-09: 4 mg via INTRAVENOUS
  Filled 2013-07-09: qty 2

## 2013-07-09 MED ORDER — ONDANSETRON HCL 4 MG/2ML IJ SOLN
INTRAMUSCULAR | Status: AC
  Administered 2013-07-09: 17:00:00 4 mg via INTRAVENOUS
  Filled 2013-07-09: qty 2

## 2013-07-09 MED ORDER — PHENOL 1.4 % MT LIQD: 1.0000 | OROMUCOSAL | Status: DC | PRN

## 2013-07-09 MED ORDER — 0.9 % SODIUM CHLORIDE (POUR BTL) OPTIME
TOPICAL | Status: DC | PRN
  Administered 2013-07-09 (×2): 1000 mL

## 2013-07-09 MED ORDER — CEFAZOLIN SODIUM 1-5 GM-% IV SOLN
1.0000 g | Freq: Three times a day (TID) | INTRAVENOUS | Status: AC
  Administered 2013-07-09 – 2013-07-10 (×2): 1 g via INTRAVENOUS
  Filled 2013-07-09 (×2): qty 50

## 2013-07-09 MED ORDER — OXYCODONE-ACETAMINOPHEN 5-325 MG PO TABS
ORAL_TABLET | ORAL | Status: AC
  Administered 2013-07-09: 2 via ORAL
  Filled 2013-07-09: qty 2

## 2013-07-09 MED ORDER — MORPHINE SULFATE 2 MG/ML IJ SOLN
1.0000 mg | INTRAMUSCULAR | Status: DC | PRN
  Administered 2013-07-09 (×3): 2 mg via INTRAVENOUS
  Filled 2013-07-09: qty 1

## 2013-07-09 MED ORDER — FENTANYL CITRATE 0.05 MG/ML IJ SOLN
INTRAMUSCULAR | Status: DC | PRN
  Administered 2013-07-09: 50 ug via INTRAVENOUS
  Administered 2013-07-09: 25 ug via INTRAVENOUS
  Administered 2013-07-09: 100 ug via INTRAVENOUS
  Administered 2013-07-09: 50 ug via INTRAVENOUS

## 2013-07-09 MED ORDER — LIDOCAINE HCL (CARDIAC) 20 MG/ML IV SOLN
INTRAVENOUS | Status: DC | PRN
  Administered 2013-07-09: 40 mg via INTRAVENOUS

## 2013-07-09 MED ORDER — NEOSTIGMINE METHYLSULFATE 1 MG/ML IJ SOLN
INTRAMUSCULAR | Status: DC | PRN
  Administered 2013-07-09: 4 mg via INTRAVENOUS

## 2013-07-09 MED ORDER — PHENYLEPHRINE HCL 10 MG/ML IJ SOLN
INTRAMUSCULAR | Status: DC | PRN
  Administered 2013-07-09 (×3): 80 ug via INTRAVENOUS

## 2013-07-09 MED ORDER — SODIUM CHLORIDE 0.9 % IV SOLN
INTRAVENOUS | Status: DC
  Administered 2013-07-10 – 2013-07-11 (×2): via INTRAVENOUS

## 2013-07-09 MED ORDER — MENTHOL 3 MG MT LOZG: 1.0000 | LOZENGE | OROMUCOSAL | Status: DC | PRN

## 2013-07-09 MED ORDER — ROCURONIUM BROMIDE 100 MG/10ML IV SOLN
INTRAVENOUS | Status: DC | PRN
  Administered 2013-07-09: 15 mg via INTRAVENOUS
  Administered 2013-07-09: 35 mg via INTRAVENOUS

## 2013-07-09 MED ORDER — OXYCODONE-ACETAMINOPHEN 5-325 MG PO TABS
1.0000 | ORAL_TABLET | ORAL | Status: DC | PRN
  Administered 2013-07-09 (×2): 2 via ORAL
  Administered 2013-07-10: 1 via ORAL
  Administered 2013-07-10: 2 via ORAL
  Filled 2013-07-09 (×2): qty 2
  Filled 2013-07-09: qty 1

## 2013-07-09 MED ORDER — DIAZEPAM 5 MG PO TABS
5.0000 mg | ORAL_TABLET | Freq: Four times a day (QID) | ORAL | Status: DC | PRN
  Administered 2013-07-09 – 2013-07-11 (×3): 5 mg via ORAL
  Filled 2013-07-09 (×3): qty 1

## 2013-07-09 MED ORDER — WHITE PETROLATUM GEL
Status: AC
  Administered 2013-07-10: 0.2
  Filled 2013-07-09: qty 5

## 2013-07-09 MED ORDER — BUPIVACAINE LIPOSOME 1.3 % IJ SUSP
INTRAMUSCULAR | Status: DC | PRN
  Administered 2013-07-09: 20 mL

## 2013-07-09 MED ORDER — THROMBIN 5000 UNITS EX SOLR
CUTANEOUS | Status: DC | PRN
  Administered 2013-07-09 (×2): 5000 [IU] via TOPICAL

## 2013-07-09 MED ORDER — HEMOSTATIC AGENTS (NO CHARGE) OPTIME
TOPICAL | Status: DC | PRN
  Administered 2013-07-09: 1 via TOPICAL

## 2013-07-09 MED ORDER — DIAZEPAM 5 MG PO TABS
ORAL_TABLET | ORAL | Status: AC
  Administered 2013-07-09: 13:00:00 5 mg via ORAL
  Filled 2013-07-09: qty 1

## 2013-07-09 MED ORDER — ONDANSETRON HCL 4 MG/2ML IJ SOLN
INTRAMUSCULAR | Status: DC | PRN
  Administered 2013-07-09: 4 mg via INTRAVENOUS

## 2013-07-09 MED ORDER — LIDOCAINE HCL 4 % MT SOLN
OROMUCOSAL | Status: DC | PRN
  Administered 2013-07-09: 4 mL via TOPICAL

## 2013-07-09 MED ORDER — HYDROMORPHONE HCL PF 1 MG/ML IJ SOLN
INTRAMUSCULAR | Status: AC
Start: 2013-07-09 — End: 2013-07-09
  Administered 2013-07-09: 0.5 mg via INTRAVENOUS
  Filled 2013-07-09: qty 1

## 2013-07-09 SURGICAL SUPPLY — 60 items
BENZOIN TINCTURE PRP APPL 2/3 (GAUZE/BANDAGES/DRESSINGS) ×3 IMPLANT
BLADE SURG ROTATE 9660 (MISCELLANEOUS) IMPLANT
BUR ACORN 6.0 (BURR) ×4 IMPLANT
BUR ACORN 6.0MM (BURR) ×2
BUR MATCHSTICK NEURO 3.0 LAGG (BURR) ×3 IMPLANT
CANISTER SUCT 3000ML (MISCELLANEOUS) ×3 IMPLANT
CLOSURE WOUND 1/2 X4 (GAUZE/BANDAGES/DRESSINGS) ×1
CONT SPEC 4OZ CLIKSEAL STRL BL (MISCELLANEOUS) ×3 IMPLANT
DRAPE LAPAROTOMY 100X72X124 (DRAPES) ×3 IMPLANT
DRAPE MICROSCOPE LEICA (MISCELLANEOUS) ×3 IMPLANT
DRAPE POUCH INSTRU U-SHP 10X18 (DRAPES) ×3 IMPLANT
DRSG OPSITE POSTOP 4X8 (GAUZE/BANDAGES/DRESSINGS) ×3 IMPLANT
DURAPREP 26ML APPLICATOR (WOUND CARE) ×3 IMPLANT
ELECT BLADE 4.0 EZ CLEAN MEGAD (MISCELLANEOUS) ×3
ELECT REM PT RETURN 9FT ADLT (ELECTROSURGICAL) ×3
ELECTRODE BLDE 4.0 EZ CLN MEGD (MISCELLANEOUS) ×1 IMPLANT
ELECTRODE REM PT RTRN 9FT ADLT (ELECTROSURGICAL) ×1 IMPLANT
GAUZE SPONGE 4X4 16PLY XRAY LF (GAUZE/BANDAGES/DRESSINGS) ×3 IMPLANT
GLOVE BIOGEL M 8.0 STRL (GLOVE) ×3 IMPLANT
GLOVE ECLIPSE 7.5 STRL STRAW (GLOVE) ×12 IMPLANT
GLOVE ECLIPSE 8.0 STRL XLNG CF (GLOVE) ×6 IMPLANT
GLOVE EXAM NITRILE LRG STRL (GLOVE) ×3 IMPLANT
GLOVE EXAM NITRILE MD LF STRL (GLOVE) IMPLANT
GLOVE EXAM NITRILE XL STR (GLOVE) IMPLANT
GLOVE EXAM NITRILE XS STR PU (GLOVE) IMPLANT
GLOVE INDICATOR 8.0 STRL GRN (GLOVE) ×3 IMPLANT
GOWN BRE IMP SLV AUR LG STRL (GOWN DISPOSABLE) IMPLANT
GOWN BRE IMP SLV AUR XL STRL (GOWN DISPOSABLE) IMPLANT
GOWN STRL REIN 2XL LVL4 (GOWN DISPOSABLE) IMPLANT
GOWN STRL REUS W/ TWL LRG LVL3 (GOWN DISPOSABLE) ×1 IMPLANT
GOWN STRL REUS W/ TWL XL LVL3 (GOWN DISPOSABLE) ×1 IMPLANT
GOWN STRL REUS W/TWL 2XL LVL3 (GOWN DISPOSABLE) ×3 IMPLANT
GOWN STRL REUS W/TWL LRG LVL3 (GOWN DISPOSABLE) ×2
GOWN STRL REUS W/TWL XL LVL3 (GOWN DISPOSABLE) ×2
KIT BASIN OR (CUSTOM PROCEDURE TRAY) ×3 IMPLANT
KIT ROOM TURNOVER OR (KITS) ×3 IMPLANT
NEEDLE HYPO 18GX1.5 BLUNT FILL (NEEDLE) IMPLANT
NEEDLE HYPO 21X1.5 SAFETY (NEEDLE) ×3 IMPLANT
NEEDLE HYPO 25X1 1.5 SAFETY (NEEDLE) IMPLANT
NEEDLE SPNL 20GX3.5 QUINCKE YW (NEEDLE) ×3 IMPLANT
NS IRRIG 1000ML POUR BTL (IV SOLUTION) ×3 IMPLANT
PACK LAMINECTOMY NEURO (CUSTOM PROCEDURE TRAY) ×3 IMPLANT
PAD ABD 8X10 STRL (GAUZE/BANDAGES/DRESSINGS) IMPLANT
PAD ARMBOARD 7.5X6 YLW CONV (MISCELLANEOUS) ×9 IMPLANT
PATTIES SURGICAL .5 X1 (DISPOSABLE) ×3 IMPLANT
RUBBERBAND STERILE (MISCELLANEOUS) ×6 IMPLANT
SPONGE GAUZE 4X4 12PLY (GAUZE/BANDAGES/DRESSINGS) ×3 IMPLANT
SPONGE LAP 4X18 X RAY DECT (DISPOSABLE) IMPLANT
SPONGE SURGIFOAM ABS GEL SZ50 (HEMOSTASIS) ×3 IMPLANT
STRIP CLOSURE SKIN 1/2X4 (GAUZE/BANDAGES/DRESSINGS) ×2 IMPLANT
SUT VIC AB 0 CT1 18XCR BRD8 (SUTURE) ×2 IMPLANT
SUT VIC AB 0 CT1 8-18 (SUTURE) ×4
SUT VIC AB 2-0 CP2 18 (SUTURE) ×6 IMPLANT
SUT VIC AB 3-0 SH 8-18 (SUTURE) ×3 IMPLANT
SYR 20CC LL (SYRINGE) ×3 IMPLANT
SYR 20ML ECCENTRIC (SYRINGE) ×3 IMPLANT
SYR 5ML LL (SYRINGE) IMPLANT
TOWEL OR 17X24 6PK STRL BLUE (TOWEL DISPOSABLE) ×3 IMPLANT
TOWEL OR 17X26 10 PK STRL BLUE (TOWEL DISPOSABLE) ×3 IMPLANT
WATER STERILE IRR 1000ML POUR (IV SOLUTION) ×3 IMPLANT

## 2013-07-09 NOTE — Transfer of Care (Signed)
Immediate Anesthesia Transfer of Care Note  Patient: Tina [name]  Procedure(s) Performed: Procedure(s): LUMBAR LAMINECTOMY/DECOMPRESSION MICRODISCECTOMY LUMBAR TWO TO LUMBAR FIVE (N/A)  Patient Location: PACU  Anesthesia Type:General  Level of Consciousness: awake and alert   Airway & Oxygen Therapy: Patient Spontanous Breathing and Patient connected to face mask oxygen  Post-op Assessment: Report given to PACU RN, Post -op Vital signs reviewed and stable and Patient moving all extremities X 4  Post vital signs: Reviewed and stable  Complications: No apparent anesthesia complications

## 2013-07-09 NOTE — Anesthesia Procedure Notes (Signed)
Procedure Name: Intubation Date/Time: 07/09/2013 10:13 AM Performed by: Rush Farmer E Pre-anesthesia Checklist: Patient identified, Emergency Drugs available, Suction available, Patient being monitored and Timeout performed Patient Re-evaluated:Patient Re-evaluated prior to inductionOxygen Delivery Method: Circle system utilized Preoxygenation: Pre-oxygenation with 100% oxygen Intubation Type: IV induction Ventilation: Mask ventilation without difficulty Laryngoscope Size: Mac and 3 Grade View: Grade I Tube type: Oral Tube size: 7.0 mm Number of attempts: 1 Airway Equipment and Method: Stylet and LTA kit utilized Placement Confirmation: ETT inserted through vocal cords under direct vision,  positive ETCO2 and breath sounds checked- equal and bilateral Secured at: 21 cm Tube secured with: Tape Dental Injury: Teeth and Oropharynx as per pre-operative assessment

## 2013-07-09 NOTE — H&P (Signed)
Tina [name] is an 80 y.o. female.   Chief Complaint: legs pain RUE:[id number] with a history of lbp with radiation to both legs who has developed urinary incontinece aftre epidural injections. Barely she walks 3 to 4 stesp before she sits to ger rid of the pain  Past Medical History  Diagnosis Date  . Diabetes mellitus   . Hypertension   . Osteopenia   . Peripheral vascular disease, unspecified   . GERD (gastroesophageal reflux disease)   . Arthritis   . Shortness of breath     exersion  . Pneumonia     child  . Anemia     hx  . Hepatitis     "a"    Past Surgical History  Procedure Laterality Date  . Appendectomy  1994  . Eye surgery Bilateral 08    Family History  Problem Relation Age of Onset  . Cancer Mother   . Hypertension Mother   . Heart disease Father     before age 1  . Heart attack Father   . Hyperlipidemia Son    Social History:  reports that she quit smoking about 52 years ago. Her smoking use included Cigarettes. She has a .3 pack-year smoking history. She has never used smokeless tobacco. She reports that she drinks about 4.4 ounces of alcohol per week. She reports that she does not use illicit drugs.  Allergies: No Known Allergies  Medications Prior to Admission  Medication Sig Dispense Refill  . amLODipine (NORVASC) 10 MG tablet Take 10 mg by mouth daily.        . colesevelam (WELCHOL) 625 MG tablet Take 1,875 mg by mouth 2 (two) times daily with a meal.       . ezetimibe (ZETIA) 10 MG tablet Take 10 mg by mouth daily.        Marland Kitchen lisinopril (PRINIVIL,ZESTRIL) 10 MG tablet Take 10 mg by mouth daily.        Marland Kitchen omeprazole (PRILOSEC) 40 MG capsule Take 40 mg by mouth daily.        . pioglitazone-glimepiride (DUETACT) 30-4 MG per tablet Take 1 tablet by mouth daily with breakfast.        . rosuvastatin (CRESTOR) 20 MG tablet Take 20 mg by mouth daily.        . sitaGLIPtin (JANUVIA) 100 MG tablet Take 100 mg by mouth daily.          Results for orders  placed during the hospital encounter of 07/09/13 (from the past 48 hour(s))  GLUCOSE, CAPILLARY     Status: Abnormal   Collection Time    07/09/13  7:12 AM      Result Value Range   Glucose-Capillary 112 (*) 70 - 99 mg/dL   No results found.  Review of Systems  Constitutional: Negative.   HENT: Negative.   Eyes: Negative.   Respiratory: Negative.   Cardiovascular:       Arterial hypertension  Gastrointestinal: Negative.   Genitourinary: Positive for urgency and frequency.  Musculoskeletal: Positive for back pain.  Skin: Negative.   Neurological: Positive for sensory change and focal weakness.  Endo/Heme/Allergies:       DM  Psychiatric/Behavioral: Negative.     Blood pressure 153/40, pulse 83, temperature 97.8 F (36.6 C), temperature source Oral, resp. rate 18, SpO2 100.00%. Physical Exam hent, nl. Neck, nl. Lungs, clear. Cv, nl. Abdomen, soft. Extremities, nl. NEURO WEAKNESS df both feet, 2/5, sensory and dtr , wnl. SLR positive at 60 femoral stretch  positive bilaterally. Mri shows stenosis from l23 to l4-5.  Assessment/Plan Decompression of the lumbar spine frm l2 to l5. She and her family are aware of risks and benefits  Kyvon Hu M 07/09/2013, 9:52 AM

## 2013-07-09 NOTE — Anesthesia Preprocedure Evaluation (Addendum)
Anesthesia Evaluation  Patient identified by MRN, date of birth, ID band Patient awake    Reviewed: Allergy & Precautions, H&P , NPO status   Airway Mallampati: II      Dental  (+) Teeth Intact and Chipped,    Pulmonary former smoker,          Cardiovascular hypertension, + Peripheral Vascular Disease     Neuro/Psych    GI/Hepatic GERD-  ,(+) Hepatitis -, A  Endo/Other  diabetes, Type 2, Oral Hypoglycemic Agents  Renal/GU      Musculoskeletal   Abdominal   Peds  Hematology  (+) anemia ,   Anesthesia Other Findings   Reproductive/Obstetrics                          Anesthesia Physical Anesthesia Plan  ASA: III  Anesthesia Plan: General   Post-op Pain Management:    Induction: Intravenous  Airway Management Planned: Oral ETT  Additional Equipment:   Intra-op Plan:   Post-operative Plan: Extubation in OR  Informed Consent: I have reviewed the patients History and Physical, chart, labs and discussed the procedure including the risks, benefits and alternatives for the proposed anesthesia with the patient or authorized representative who has indicated his/her understanding and acceptance.     Plan Discussed with:   Anesthesia Plan Comments:         Anesthesia Quick Evaluation

## 2013-07-09 NOTE — Anesthesia Postprocedure Evaluation (Signed)
  Anesthesia Post-op Note  Patient: Tina [name]  Procedure(s) Performed: Procedure(s): LUMBAR LAMINECTOMY/DECOMPRESSION MICRODISCECTOMY LUMBAR TWO TO LUMBAR FIVE (N/A)  Patient Location: PACU  Anesthesia Type:General  Level of Consciousness: awake, alert , sedated and patient cooperative  Airway and Oxygen Therapy: Patient Spontanous Breathing  Post-op Pain: moderate  Post-op Assessment: Post-op Vital signs reviewed, Patient's Cardiovascular Status Stable, Respiratory Function Stable, Patent Airway, No signs of Nausea or vomiting and Pain level controlled  Post-op Vital Signs: stable  Complications: No apparent anesthesia complications

## 2013-07-09 NOTE — Progress Notes (Signed)
Pt reports being nauseated and would like to wait on trying to swallowing meds. Zofran given. RN will continue to monitor.

## 2013-07-09 NOTE — Progress Notes (Signed)
Op note [phone]

## 2013-07-09 NOTE — Preoperative (Signed)
Beta Blockers   Reason not to administer Beta Blockers:Not Applicable 

## 2013-07-10 LAB — GLUCOSE, CAPILLARY
GLUCOSE-CAPILLARY: 92 mg/dL (ref 70–99)
GLUCOSE-CAPILLARY: 135 mg/dL — AB (ref 70–99)
Glucose-Capillary: 89 mg/dL (ref 70–99)
Glucose-Capillary: 62 mg/dL — ABNORMAL LOW (ref 70–99)

## 2013-07-10 MED ORDER — HYDROCODONE-ACETAMINOPHEN 10-325 MG PO TABS
1.0000 | ORAL_TABLET | ORAL | Status: DC | PRN
  Administered 2013-07-10 – 2013-07-11 (×3): 2 via ORAL
  Administered 2013-07-12: 1 via ORAL
  Administered 2013-07-12: 2 via ORAL
  Administered 2013-07-13: 1 via ORAL
  Administered 2013-07-13: 2 via ORAL
  Filled 2013-07-10: qty 1
  Filled 2013-07-10 (×3): qty 2
  Filled 2013-07-10: qty 1
  Filled 2013-07-10 (×2): qty 2

## 2013-07-10 MED ORDER — TAMSULOSIN HCL 0.4 MG PO CAPS
0.4000 mg | ORAL_CAPSULE | Freq: Every day | ORAL | Status: DC
  Administered 2013-07-10: 0.4 mg via ORAL
  Filled 2013-07-10 (×2): qty 1

## 2013-07-10 NOTE — Plan of Care (Signed)
Pt states she is not having chest pain. Eating dinner and making phones calls. Will cont to monitor.

## 2013-07-10 NOTE — Plan of Care (Signed)
Pt had a small episode of incont of urine. With the help of nurse pt got oob and [facility]. Unable to void a large amount. Bladder scan done and showed. 199 ml. Denies tenderness or discomfort. Will cont to monitor pt.

## 2013-07-10 NOTE — Plan of Care (Signed)
Pt denies any chest pain at this time. Resting in bed. Will cont to monitor

## 2013-07-10 NOTE — Evaluation (Signed)
Physical Therapy Evaluation Patient Details Name: Tina [name] MRN: [id number] DOB: 10/04/1930 Today's Date: 07/10/2013 Time: [id number] PT Time Calculation (min): 26 min  PT Assessment / Plan / Recommendation History of Present Illness  80 y.o. s/p LUMBAR LAMINECTOMY/DECOMPRESSION MICRODISCECTOMY LUMBAR TWO TO LUMBAR FIVE (N/A)  Clinical Impression  Pt needs encouragement for ambulation and is very particular about how PT is able to assist her.  ? Pt's cognition level and if this is baseline.  Pt mentions her daughter can stay with her, however daughter is not present to confirm this.  Feel SNF may be safest D/C option at this time.  Will continue to follow.      PT Assessment  Patient needs continued PT services    Follow Up Recommendations  SNF    Does the patient have the potential to tolerate intense rehabilitation      Barriers to Discharge Decreased caregiver support      Equipment Recommendations  Rolling walker with 5" wheels    Recommendations for Other Services     Frequency Min 5X/week    Precautions / Restrictions Precautions Precautions: Back;Fall Precaution Booklet Issued: Yes (comment) Precaution Comments: Reviewed precautions with pt Restrictions Weight Bearing Restrictions: No   Pertinent Vitals/Pain At one point states she is very painful, but denies wanting to call for pain meds.        Mobility  Bed Mobility Overal bed mobility: Needs Assistance Bed Mobility: Sidelying to Sit;Sit to Sidelying Sidelying to sit: Min assist Sit to sidelying: Mod assist General bed mobility comments: cues for back precautions and A with bringing Bil LEs into bed.   Transfers Overall transfer level: Needs assistance Equipment used: Rolling walker (2 wheeled) Transfers: Sit to/from Stand Sit to Stand: Min guard General transfer comment: cues for technique.  Ambulation/Gait Ambulation/Gait assistance: Min guard Ambulation Distance (Feet): 100 Feet Assistive  device: Rolling walker (2 wheeled) Gait Pattern/deviations: Step-through pattern;Decreased stride length General Gait Details: cues for safe use of RW and encouragement.      Exercises     PT Diagnosis: Difficulty walking  PT Problem List: Decreased activity tolerance;Decreased balance;Decreased mobility;Decreased knowledge of use of DME;Decreased knowledge of precautions PT Treatment Interventions: DME instruction;Gait training;Stair training;Functional mobility training;Therapeutic activities;Therapeutic exercise;Balance training;Neuromuscular re-education;Patient/family education     PT Goals(Current goals can be found in the care plan section) Acute Rehab PT Goals Patient Stated Goal: not stated PT Goal Formulation: With patient Time For Goal Achievement: 07/17/13 Potential to Achieve Goals: Good  Visit Information  Last PT Received On: 07/10/13 Assistance Needed: +1 History of Present Illness: 80 y.o. s/p LUMBAR LAMINECTOMY/DECOMPRESSION MICRODISCECTOMY LUMBAR TWO TO LUMBAR FIVE (N/A)       Prior Functioning  Home Living Family/patient expects to be discharged to:: Private residence Living Arrangements: Alone Available Help at Discharge: Family;Other (Comment) (says daughter can come stay with her) Type of Home: House Home Access: Stairs to enter Entergy CorporationEntrance Stairs-Number of Steps: 3 Entrance Stairs-Rails: Right Home Layout: Two level Alternate Level Stairs-Number of Steps: 13 Alternate Level Stairs-Rails:  (rail-unsure which side) Home Equipment: Walker - 4 wheels;Cane - quad;Cane - single point;Toilet riser;Shower seat;Adaptive equipment Adaptive Equipment: Reacher;Sock aid;Other (Comment) (toilet aid) Additional Comments: Unclear how much A daughter can provide.   Prior Function Level of Independence: Needs assistance Gait / Transfers Assistance Needed: used cane and walker  ADL's / Homemaking Assistance Needed: assistance with cleaning and daughter does  shopping Communication Communication: No difficulties Dominant Hand: Right    Cognition  Cognition Arousal/Alertness: Awake/alert  Behavior During Therapy: Anxious Overall Cognitive Status: No family/caregiver present to determine baseline cognitive functioning    Extremity/Trunk Assessment Upper Extremity Assessment Upper Extremity Assessment: Defer to OT evaluation Lower Extremity Assessment Lower Extremity Assessment: Overall WFL for tasks assessed   Balance    End of Session PT - End of Session Equipment Utilized During Treatment: Gait belt Activity Tolerance: Patient tolerated treatment well Patient left: in bed;with call bell/phone within reach;with bed alarm set Nurse Communication: Mobility status  GP     Sunny Schlein, [place] [phone] 07/10/2013, 3:02 PM

## 2013-07-10 NOTE — Progress Notes (Signed)
   CARE MANAGEMENT NOTE 07/10/2013  Patient:  Tina [name],Tina [name]   Account Number:  [id number]  Date Initiated:  07/10/2013  Documentation initiated by:  Jiles CrockerHANDLER,Sua Spadafora  Subjective/Objective Assessment:   Admitted for back surgery     Action/Plan:   CM following for dcp   Anticipated DC Date:  07/15/2013   Anticipated DC Plan:  AWAITING FOR PT/OT EVALS FOR DISPOSITION     DC Planning Services  CM consult          Status of service:  In process, will continue to follow Medicare Important Message given?  NA - LOS <3 / Initial given by admissions (If response is "NO", the following Medicare IM given date fields will be blank)  Per UR Regulation:  Reviewed for med. necessity/level of care/duration of stay  Comments:  1/14/2015Abelino Derrick- B Avan Gullett RN,BSN,MHA [phone]

## 2013-07-10 NOTE — Progress Notes (Signed)
Patient ID: Tina [name], female   DOB: 11/18/1930, 80 y.o.   MRN: [id number] Stable, c/o incisional apin, no weakness. Foley out. hemovac to be removed in am

## 2013-07-10 NOTE — Evaluation (Addendum)
Occupational Therapy Evaluation Patient Details Name: Tina [name] A Lipkin MRN: [id number] DOB: 07/25/1930 Today's Date: 07/10/2013 Time: [id number] OT Time Calculation (min): 22 min  OT Assessment / Plan / Recommendation History of present illness 80 y.o. s/p LUMBAR LAMINECTOMY/DECOMPRESSION MICRODISCECTOMY LUMBAR TWO TO LUMBAR FIVE (N/A)   Clinical Impression   Pt presents with below problem list. Pt independent with ADLs, PTA. Feel pt will benefit from acute OT to increase independence prior to d/c. Recommending SNF for additional rehab.    OT Assessment  Patient needs continued OT Services    Follow Up Recommendations  SNF;Supervision/Assistance - 24 hour    Barriers to Discharge      Equipment Recommendations  Other (comment) (tbd)    Recommendations for Other Services    Frequency  Min 2X/week    Precautions / Restrictions Precautions Precautions: Back;Fall Precaution Booklet Issued: Yes (comment) Precaution Comments: Reviewed precautions with pt Restrictions Weight Bearing Restrictions: No   Pertinent Vitals/Pain Pain 5/10. Repositioned.     ADL  Upper Body Dressing: Minimal assistance Where Assessed - Upper Body Dressing: Unsupported sitting Lower Body Dressing: Maximal assistance (without AE) Where Assessed - Lower Body Dressing: Supported sit to stand Toilet Transfer: Hydrographic surveyorMin guard Toilet Transfer Method: Sit to Baristastand Toilet Transfer Equipment: Other (comment) (from bed) Tub/Shower Transfer Method: Not assessed Equipment Used: Gait belt;Rolling walker Transfers/Ambulation Related to ADLs: Min guard ADL Comments: Educated on toilet aid for hygiene and spoke about AE for LB ADLs. Pt limited in evaluation due to dizziness. Educated on use of two cups for teeth care and having items on right side of sink to avoid breaking precautions. Pt sat EOB and [facility]   OT Diagnosis: Acute pain  OT Problem List: Decreased range of motion;Decreased  strength;Decreased knowledge of precautions;Decreased knowledge of use of DME or AE;Pain;Decreased activity tolerance OT Treatment Interventions: Self-care/ADL training;DME and/or AE instruction;Therapeutic activities;Balance training;Patient/family education   OT Goals(Current goals can be found in the care plan section) Acute Rehab OT Goals Patient Stated Goal: not stated OT Goal Formulation: With patient Time For Goal Achievement: 07/17/13 Potential to Achieve Goals: Good  Visit Information  Last OT Received On: 07/10/13 Assistance Needed: +1 History of Present Illness: 80 y.o. s/p LUMBAR LAMINECTOMY/DECOMPRESSION MICRODISCECTOMY LUMBAR TWO TO LUMBAR FIVE (N/A)       Prior Functioning     Home Living Family/patient expects to be discharged to:: Private residence Living Arrangements: Alone Available Help at Discharge: Family;Other (Comment) (says daughter can come stay with her) Type of Home: House Home Access: Stairs to enter [employer] CorporationEntrance Stairs-Number of Steps: 3 Entrance Stairs-Rails: Right Home Layout: Two level Alternate Level Stairs-Number of Steps: 13 Alternate Level Stairs-Rails:  (rail-unsure which side) Home Equipment: Walker - 4 wheels;Cane - quad;Cane - single point;Toilet riser;Shower seat;Adaptive equipment Adaptive Equipment: Reacher;Sock aid;Other (Comment) (toilet aid) Additional Comments: Unclear how much A daughter can provide.   Prior Function Level of Independence: Needs assistance Gait / Transfers Assistance Needed: used cane and walker  ADL's / Homemaking Assistance Needed: assistance with cleaning and daughter does shopping Communication Communication: No difficulties Dominant Hand: Right         Vision/Perception     Cognition  Cognition Arousal/Alertness: Awake/alert Behavior During Therapy: Anxious Overall Cognitive Status: Difficult to assess   Extremity/Trunk Assessment Upper Extremity Assessment Upper Extremity Assessment: Overall  WFL for tasks assessed Lower Extremity Assessment Lower Extremity Assessment: Defer to PT evaluation     Mobility Bed Mobility Overal bed mobility: Needs Assistance Bed  Mobility: Rolling;Sidelying to Sit;Sit to Sidelying Rolling: Min assist Sidelying to sit: Mod assist Sit to sidelying: Mod assist General bed mobility comments: Assisted with legs going to sidelying position. Cues for technique. Transfers Overall transfer level: Needs assistance Equipment used: Rolling walker (2 wheeled) Transfers: Sit to/from Stand Sit to Stand: Min guard General transfer comment: cues for technique.      Exercise     Balance     End of Session OT - End of Session Equipment Utilized During Treatment: Gait belt;Rolling walker Activity Tolerance: Other (comment);Patient limited by pain (limited by dizziness) Patient left: in bed;with call bell/phone within reach;with bed alarm set Nurse Communication: Mobility status  GO       Earlie Raveling OTR/L [phone] 07/10/2013, 3:03 PM

## 2013-07-10 NOTE — Plan of Care (Signed)
Bladder scan done and showed 237 ml of urine. Pt states she does not feel the urge to void. No tenderness or discomfort. Will cont to monitor.

## 2013-07-10 NOTE — Plan of Care (Signed)
Bladder scan done and showed 104 ml of urine. Will cont to monitor and keep fluids infusing.

## 2013-07-10 NOTE — Plan of Care (Signed)
Pt called nurse into the room stating that she is having some chest pain and tightness to left side of chest. Vs obtained. 2 l o2 placed on pt. Pt states the chest pain only last a couple of seconds. Sharp pain. ekg done and dr Jeral Fruitbotero called and message left with the secretary in the OR neuro. Waiting for md to call back. Pt is eating dinner at this time. Denies any chest pain or tightness.

## 2013-07-10 NOTE — Clinical Social Work Note (Signed)
CSW received consult for possible SNF placement [facility] CSW currently awaiting PT/OT evaluations and recommendations. CSW will continue to follow pt's chart for recommendations.  Darlyn ChamberEmily Summerville, LCSWA Clinical Social Worker [phone]

## 2013-07-10 NOTE — Op Note (Signed)
NAMNicholes Stairs:  Matranga, Donyae              ACCOUNT NO.:  [id number]  MEDICAL RECORD NO.:  [id number]  LOCATION:  [id number]                        FACILITY:  [facility]  PHYSICIAN:  Hilda LiasErnesto Ardell Aaronson, M.D.   DATE OF BIRTH:  11/26/1930  DATE OF PROCEDURE:  07/09/2013 DATE OF DISCHARGE:                              OPERATIVE REPORT   PREOPERATIVE DIAGNOSIS:  Lumbar stenosis L2-L5, S1 with neurogenic claudication.  POSTOPERATIVE DIAGNOSIS:  Lumbar stenosis L2-L5, S1 with neurogenic claudication.  PROCEDURE:  Bilateral 2, 3, 4, 5 laminectomy and foraminotomy, decompression of the thecal sac, and foraminotomy from L1-L2 down to L5- S1.  Microscope.  SURGEON:  Hilda LiasErnesto Cyndal Kasson, M.D.  ASSISTANT:  Kathaleen MaserHenry A. Pool, M.D.  CLINICAL HISTORY:  Ms. Vassie LollCurran is a lady who comes to see me in my office complaining of back pain, worse in both legs.  She has noticed that now walking less than half a block, she had to sit because of pain in both the legs.  X-rays show severe stenosis from L2 down to L5-S1.  Surgery was advised and the risk was fully explained to her and the family.  DESCRIPTION OF PROCEDURE:  The patient was taken to the OR, and she was positioned in a prone manner.  The back was cleaned with DuraPrep and Betadine.  Because of her size, we were unable to feel any bony structure.  The midline was made through the skin through a thick adipose tissue straight down to the lumbar area.  We were able to identify the L5-S1 and we worked all the way up until we were able to see and feel L2 and L3.  X-ray showed that we were in the right area. Then using the [place], we removed the spinous process of L2, 3, 4, 5, and with the drill, as well as the help of the microscope, we started removing the lamina bilaterally.  The patient has quite a bit of thick calcified yellow ligament, worse at the L3-4 and 4-5.  Decompression of the thecal sac as well foraminotomy to decompress the L2-S1 root was achieved bilaterally.   At the end, when we have plenty of space for the thecal sac itself for every single nerve root.  The area was irrigated. Valsalva maneuver was negative.  The drain was left in the epidural space and the wound was closed in multiple layers of Vicryl and Steri- Strips.          ______________________________ Hilda LiasErnesto Kenry Daubert, M.D.     EB/MEDQ  D:  07/09/2013  T:  07/09/2013  Job:  [id number]

## 2013-07-11 ENCOUNTER — Encounter (HOSPITAL_COMMUNITY): Payer: Self-pay | Admitting: Neurosurgery

## 2013-07-11 DIAGNOSIS — M48061 Spinal stenosis, lumbar region without neurogenic claudication: Secondary | ICD-10-CM

## 2013-07-11 DIAGNOSIS — IMO0002 Reserved for concepts with insufficient information to code with codable children: Secondary | ICD-10-CM

## 2013-07-11 LAB — GLUCOSE, CAPILLARY
GLUCOSE-CAPILLARY: 95 mg/dL (ref 70–99)
Glucose-Capillary: 81 mg/dL (ref 70–99)
Glucose-Capillary: 140 mg/dL — ABNORMAL HIGH (ref 70–99)
Glucose-Capillary: 115 mg/dL — ABNORMAL HIGH (ref 70–99)

## 2013-07-11 LAB — URINALYSIS, ROUTINE W REFLEX MICROSCOPIC
Bilirubin Urine: NEGATIVE
Glucose, UA: NEGATIVE mg/dL
HGB URINE DIPSTICK: NEGATIVE
Ketones, ur: NEGATIVE mg/dL
Leukocytes, UA: NEGATIVE
Nitrite: NEGATIVE
PROTEIN: NEGATIVE mg/dL
Specific Gravity, Urine: 1.014 (ref 1.005–1.030)
UROBILINOGEN UA: 0.2 mg/dL (ref 0.0–1.0)
pH: 5.5 (ref 5.0–8.0)

## 2013-07-11 MED ORDER — FLEET ENEMA 7-19 GM/118ML RE ENEM: 1.0000 | ENEMA | Freq: Every day | RECTAL | Status: DC | PRN

## 2013-07-11 MED ORDER — TAMSULOSIN HCL 0.4 MG PO CAPS
0.8000 mg | ORAL_CAPSULE | Freq: Every day | ORAL | Status: DC
  Administered 2013-07-11 – 2013-07-16 (×6): 0.8 mg via ORAL
  Filled 2013-07-11 (×6): qty 2

## 2013-07-11 NOTE — Progress Notes (Signed)
Patient ID: Tina [name], female   DOB: 11/08/1930, 80 y.o.   MRN: [id number] Urinary incontinency , rectal tone present. hemovac out. Wound dry. C/o about nurses care at night. Plan foley, gu consult

## 2013-07-11 NOTE — Clinical Social Work Placement (Signed)
Clinical Social Work Department CLINICAL SOCIAL WORK PLACEMENT NOTE 07/11/2013  Patient:  Tina [name],Tina [name]  Account Number:  [id number] Admit date:  07/09/2013  Clinical Social Worker:  Irving BurtonEMILY SUMMERVILLE, LCSWA  Date/time:  07/11/2013 02:03 PM  Clinical Social Work is seeking post-discharge placement for this patient at the following level of care:   SKILLED NURSING   (*CSW will update this form in Epic as items are completed)   07/11/2013  Patient/family provided with [facility] of Clinical Social Works list of facilities offering this level of care within the geographic area requested by the patient (or if unable, by the patients family).  07/11/2013  Patient/family informed of their freedom to choose among providers that offer the needed level of care, that participate in Medicare, Medicaid or managed care program needed by the patient, have an available bed and are willing to accept the patient.  07/11/2013  Patient/family informed of [facility] ownership interest in [facility], as well as of the fact that they are under no obligation to receive care at this facility.  PASARR submitted to [employer] on 07/11/2013 PASARR number received from [employer] on 07/11/2013  FL2 transmitted to all facilities in geographic area requested by pt/family on  07/11/2013 FL2 transmitted to all facilities within larger geographic area on   Patient informed that his/her managed care company has contracts with or will negotiate with  certain facilities, including the following:     Patient/family informed of bed offers received:   Patient chooses bed at  Physician recommends and patient chooses bed at    Patient to be transferred to  on   Patient to be transferred to facility by   The following physician request were entered in Epic:   Additional Comments:   Darlyn ChamberEmily [place], Theresia MajorsLCSWA Clinical Social Worker [phone]

## 2013-07-11 NOTE — Progress Notes (Signed)
SNF rehab is recommended. We will sign off. [phone]

## 2013-07-11 NOTE — Progress Notes (Signed)
PT Cancellation Note  Patient Details Name: Tina [name] MRN: [id number] DOB: 05/25/1931   Cancelled Treatment:    Reason Eval/Treat Not Completed: Fatigue/lethargy limiting ability to participate;Pain limiting ability to participate.  Will try back as able.     [name], Alison MurrayMegan F 07/11/2013, 9:37 AM

## 2013-07-11 NOTE — Progress Notes (Signed)
Pt alert and oriented x 4 but very confused and forgetful. Pt reoriented by RN; [facility] but unable to void each time. Pt c/o pain and burning when trying to urinate, white discharge noted around vagina.Bladder scan and I & O cath with 550 at midnight. UA also sent to the lab. Pt remains unable to void this am. MD paged but no returned call back, incoming RN notified and pt not I &O cath is am d/t possible need for a foley with pt being catherized x3 already. Incoming RN to follow up for the possible foley.

## 2013-07-11 NOTE — Consult Note (Signed)
Physical Medicine and Rehabilitation Consult Reason for Consult: Lumbar stenosis with radiculopathy Referring Physician: Dr. Jeral Fruit   HPI: Tina [name] is a 80 y.o. right-handed female with history of hypertension as well as diabetes mellitus with peripheral neuropathy. Patient was independent prior to admission. Admitted 07/09/2013 with low-back pain radiating to the lower extremities as well as urinary incontinence. No relief with conservative care. X-rays and imaging revealed lumbar L2-5 neurogenic claudication with stenosis/radiculopathy. Underwent bilateral 2,3,4,5 laminectomy and foraminotomies decompression of the thecal sac and foraminotomies L1-L2 and L5-S1 07/09/2013 per Dr. Jeral Fruit. Postoperative pain management. Bouts of confusion and restlessness there is question of the patient's premorbid baseline.  Bouts of urinary retention in and out and catheterization 550 mL. Urinalysis study negative. Physical and occupational therapy evaluation completed 07/10/2013 limited evaluation with bouts of confusion. Recommendations made for physical medicine rehabilitation consult to consider inpatient rehabilitation services  Patient gives a 1 year history of gradually increasing lower extremity weakness and decreased mobility. Right leg is worse than left leg. Complains of pain in the back when turning in bed   Review of Systems  Respiratory: Positive for shortness of breath.   Gastrointestinal:       GERD  Musculoskeletal: Positive for back pain.  All other systems reviewed and are negative.   Past Medical History  Diagnosis Date  . Diabetes mellitus   . Hypertension   . Osteopenia   . Peripheral vascular disease, unspecified   . GERD (gastroesophageal reflux disease)   . Arthritis   . Shortness of breath     exersion  . Pneumonia     child  . Anemia     hx  . Hepatitis     "a"   Past Surgical History  Procedure Laterality Date  . Appendectomy  1994  . Eye surgery Bilateral  08  . Lumbar laminectomy/decompression microdiscectomy N/A 07/09/2013    Procedure: LUMBAR LAMINECTOMY/DECOMPRESSION MICRODISCECTOMY LUMBAR TWO TO LUMBAR FIVE;  Surgeon: Karn Cassis, MD;  Location: [facility];  Service: Neurosurgery;  Laterality: N/A;   Family History  Problem Relation Age of Onset  . Cancer Mother   . Hypertension Mother   . Heart disease Father     before age 13  . Heart attack Father   . Hyperlipidemia Son    Social History:  reports that she quit smoking about 52 years ago. Her smoking use included Cigarettes. She has a .3 pack-year smoking history. She has never used smokeless tobacco. She reports that she drinks about 4.4 ounces of alcohol per week. She reports that she does not use illicit drugs. Allergies: No Known Allergies Medications Prior to Admission  Medication Sig Dispense Refill  . amLODipine (NORVASC) 10 MG tablet Take 10 mg by mouth daily.        . colesevelam (WELCHOL) 625 MG tablet Take 1,875 mg by mouth 2 (two) times daily with a meal.       . ezetimibe (ZETIA) 10 MG tablet Take 10 mg by mouth daily.        Marland Kitchen lisinopril (PRINIVIL,ZESTRIL) 10 MG tablet Take 10 mg by mouth daily.        Marland Kitchen omeprazole (PRILOSEC) 40 MG capsule Take 40 mg by mouth daily.        . pioglitazone-glimepiride (DUETACT) 30-4 MG per tablet Take 1 tablet by mouth daily with breakfast.        . rosuvastatin (CRESTOR) 20 MG tablet Take 20 mg by mouth daily.        Marland Kitchen  sitaGLIPtin (JANUVIA) 100 MG tablet Take 100 mg by mouth daily.          Home: Home Living Family/patient expects to be discharged to:: Private residence Living Arrangements: Alone Available Help at Discharge: Family;Other (Comment) (says daughter can come stay with her) Type of Home: House Home Access: Stairs to enter [employer] of Steps: 3 Entrance Stairs-Rails: Right Home Layout: Two level Alternate Level Stairs-Number of Steps: 13 Alternate Level Stairs-Rails:  (rail-unsure which  side) Home Equipment: Walker - 4 wheels;Cane - quad;Cane - single point;Toilet riser;Shower seat;Adaptive equipment Adaptive Equipment: Reacher;Sock aid;Other (Comment) (toilet aid) Additional Comments: Unclear how much A daughter can provide.    Functional History:   Functional Status:  Mobility:     Ambulation/Gait Ambulation Distance (Feet): 100 Feet General Gait Details: cues for safe use of RW and encouragement.      ADL: ADL Upper Body Dressing: Minimal assistance Where Assessed - Upper Body Dressing: Unsupported sitting Lower Body Dressing: Maximal assistance (without AE) Where Assessed - Lower Body Dressing: Supported sit to stand Toilet Transfer: Hydrographic surveyor Method: Sit to Barista: Other (comment) (from bed) Tub/Shower Transfer Method: Not assessed Equipment Used: Gait belt;Rolling walker Transfers/Ambulation Related to ADLs: Min guard ADL Comments: Educated on toilet aid for hygiene and spoke about AE for LB ADLs. Pt limited in evaluation due to dizziness. Educated on use of two cups for teeth care and having items on right side of sink to avoid breaking precautions. Pt sat EOB and took a few side steps towards HOB.  Cognition: Cognition Overall Cognitive Status: Within Functional Limits for tasks assessed Orientation Level: Oriented X4 Cognition Arousal/Alertness: Awake/alert Behavior During Therapy: Anxious Overall Cognitive Status: Within Functional Limits for tasks assessed  Blood pressure 150/51, pulse 85, temperature 98.8 F (37.1 C), temperature source Oral, resp. rate 18, height 5\' 3"  (1.6 m), weight 107.502 kg (237 lb), SpO2 97.00%. Physical Exam  Vitals reviewed. HENT:  Head: Normocephalic.  Eyes: EOM are normal.  Neck: Normal range of motion. Neck supple. No thyromegaly present.  Cardiovascular: Normal rate and regular rhythm.   Respiratory: Effort normal and breath sounds normal. No respiratory distress.  GI:  Soft. Bowel sounds are normal. She exhibits no distension. There is no tenderness.  Neurological: She is alert.  Patient was oriented to name, age and date of birth but needed cues for day of the week. She did follow simple commands  Skin:  Back incision is dressed   upper extremity strength 5/5 bilateral deltoid, bicep, tricep, grip 2 minus/5 in the right hip flexor knee extensor ankle dorsiflexor plantar flexor 3 minus in the left hip flexor knee extensor ankle dorsiflexor plantar flexion Decreased sensation in the toes of both feet. Decreased proprioception  Results for orders placed during the hospital encounter of 07/09/13 (from the past 24 hour(s))  GLUCOSE, CAPILLARY     Status: Abnormal   Collection Time    07/10/13 11:28 AM      Result Value Range   Glucose-Capillary 135 (*) 70 - 99 mg/dL   Comment 1 Notify RN     Comment 2 Documented in Chart    GLUCOSE, CAPILLARY     Status: None   Collection Time    07/10/13  4:55 PM      Result Value Range   Glucose-Capillary 89  70 - 99 mg/dL   Comment 1 Documented in Chart     Comment 2 Notify RN    GLUCOSE, CAPILLARY  Status: Abnormal   Collection Time    07/10/13 10:27 PM      Result Value Range   Glucose-Capillary 62 (*) 70 - 99 mg/dL   Comment 1 Notify RN     Comment 2 Documented in Chart    URINALYSIS, ROUTINE W REFLEX MICROSCOPIC     Status: None   Collection Time    07/10/13 11:58 PM      Result Value Range   Color, Urine YELLOW  YELLOW   APPearance CLEAR  CLEAR   Specific Gravity, Urine 1.014  1.005 - 1.030   pH 5.5  5.0 - 8.0   Glucose, UA NEGATIVE  NEGATIVE mg/dL   Hgb urine dipstick NEGATIVE  NEGATIVE   Bilirubin Urine NEGATIVE  NEGATIVE   Ketones, ur NEGATIVE  NEGATIVE mg/dL   Protein, ur NEGATIVE  NEGATIVE mg/dL   Urobilinogen, UA 0.2  0.0 - 1.0 mg/dL   Nitrite NEGATIVE  NEGATIVE   Leukocytes, UA NEGATIVE  NEGATIVE  GLUCOSE, CAPILLARY     Status: None   Collection Time    07/11/13  7:26 AM      Result  Value Range   Glucose-Capillary 81  70 - 99 mg/dL   Comment 1 Documented in Chart     Comment 2 Notify RN     Dg Lumbar Spine 2-3 Views  07/09/2013   CLINICAL DATA:  Localization prior to L2 through L5 micro discectomy.  EXAM: LUMBAR SPINE - 1 VIEW  COMPARISON:  Comparison is made to a lumbar spine series dated June 17, 2013  FINDINGS: The upper metallic trocar lies posterior to the L1-L2 disc level. The lower most trocar lies posterior to the L5-S1 disc level. There is disc space narrowing at L2-3, L3-4, and L4-5. A larger metallic clamp- like device lies over the spinous processes of L3, L4, and L5.  IMPRESSION: The metallic devices lie posterior to the L1-L2 and the L5-S1 disc levels.   Electronically Signed   By: David  Swazil[place]   On: 07/09/2013 12:33    Assessment/Plan: Diagnosis: Lumbar stenosis and radiculopathy status post L1-S1 decompression 1. Does the need for close, 24 hr/day medical supervision in concert with the patient's rehab needs make it unreasonable for this patient to be served in a less intensive setting? No 2. Co-Morbidities requiring supervision/potential complications: Peripheral artery disease, diabetes 3. Due to bladder management, bowel management, safety, skin/wound care, disease management, medication administration, pain management and patient education, does the patient require 24 hr/day rehab nursing? Potentially 4. Does the patient require coordinated care of a physician, rehab nurse, PT (0.5-1 hrs/day, 5 days/week) to address physical and functional deficits in the context of the above medical diagnosis(es)? Potentially Addressing deficits in the following areas: balance, endurance, locomotion, strength, transferring, bowel/bladder control, bathing and dressing 5. Can the patient actively participate in an intensive therapy program of at least 3 hrs of therapy per day at least 5 days per week? No 6. The potential for patient to make measurable gains while on  inpatient rehab is poor 7. Anticipated functional outcomes upon discharge from inpatient rehab are NA with PT, NA with OT, NA with SLP. 8. Estimated rehab length of stay to reach the above functional goals is: NA 9. Does the patient have adequate social supports to accommodate these discharge functional goals? Potentially 10. Anticipated D/C setting: Home 11. Anticipated post D/C treatments: HH therapy 12. Overall Rehab/Functional Prognosis: good  RECOMMENDATIONS: This patient's condition is appropriate for continued rehabilitative care in the following  setting: SNF Patient has agreed to participate in recommended program. Yes Note that insurance prior authorization may be required for reimbursement for recommended care.  Comment: Patient would benefit from a slower paced rehabilitation program    07/11/2013

## 2013-07-11 NOTE — Clinical Social Work Psychosocial (Signed)
Clinical Social Work Department BRIEF PSYCHOSOCIAL ASSESSMENT 07/11/2013  Patient:  Tina [name], Tina [name]     Account Number:  [id number]     Admit date:  07/09/2013  Clinical Social Worker:  Donna Christen  Date/Time:  07/11/2013 01:29 PM  Referred by:  Physician  Date Referred:  07/11/2013 Referred for  SNF Placement   Other Referral:   none.   Interview type:  Patient Other interview type:   none.    PSYCHOSOCIAL DATA Living Status:  ALONE Admitted from facility:   Level of care:   Primary support name:  Seth Bake Primary support relationship to patient:  CHILD, ADULT Degree of support available:   Strong degree of support. Pt stated that her daughter lives near by and comes over to check on her often.    CURRENT CONCERNS Current Concerns  Post-Acute Placement   Other Concerns:   none.    SOCIAL WORK ASSESSMENT / PLAN CSW met with pt at bedside. CSW defined CSW role with [facility] to pt. Pt stated that she was welcome to speaking with CSW. CSW discussed PT/OT recommendations for SNF placement. Pt and CSW spent a few minutes discussing SNF placement details due to pt not being familiar with what it meant to be placed at a SNF for therapy. Pt agreed that she would need SNF placement due [place]." Pt expressed concern for the well-being of her cat while she is at [facility]. CSW and pt discussed options for pt's pet cat while she is at SNF, pt stated that her daughter will "just have to keep caring for him while I am gone." Pt stated that she would like to stay in [place], [place] where all her family live. CSW to fax clinical information to [facility] and present bed offers to pt.   Assessment/plan status:  Psychosocial Support/Ongoing Assessment of Needs Other assessment/ plan:   none.   Information/referral to community resources:   [facility] placement.    PATIENTS/FAMILYS RESPONSE TO PLAN OF  CARE: Pt was hesitant to SNF placement at first, but is more open to it now that she understands she will be receiving therapy and that it is for a short-term period. Pt seemed to be more at ease after discussing the care of her pet cat.       Pati Gallo, [place] Social Worker [phone]

## 2013-07-11 NOTE — Progress Notes (Signed)
Pt still unable to void after 3 I&Os. Foley placed and of urine retrieved. Pt tolerated well and just reports stiffness in legs from having to bend her knees. No other discomfort noted or reported. RN will continue to monitor

## 2013-07-12 LAB — GLUCOSE, CAPILLARY
GLUCOSE-CAPILLARY: 107 mg/dL — AB (ref 70–99)
GLUCOSE-CAPILLARY: 122 mg/dL — AB (ref 70–99)
Glucose-Capillary: 77 mg/dL (ref 70–99)
Glucose-Capillary: 159 mg/dL — ABNORMAL HIGH (ref 70–99)

## 2013-07-12 LAB — URINALYSIS, ROUTINE W REFLEX MICROSCOPIC
Glucose, UA: NEGATIVE mg/dL
Ketones, ur: 15 mg/dL — AB
Nitrite: NEGATIVE
PROTEIN: NEGATIVE mg/dL
SPECIFIC GRAVITY, URINE: 1.026 (ref 1.005–1.030)
UROBILINOGEN UA: 0.2 mg/dL (ref 0.0–1.0)
pH: 5 (ref 5.0–8.0)

## 2013-07-12 LAB — URINE CULTURE
Colony Count: NO GROWTH
Culture: NO GROWTH

## 2013-07-12 LAB — URINE MICROSCOPIC-ADD ON

## 2013-07-12 MED ORDER — PHENYLEPHRINE HCL 0.25 % NA SOLN
1.0000 | Freq: Four times a day (QID) | NASAL | Status: AC | PRN
  Administered 2013-07-12: 1 via NASAL
  Filled 2013-07-12 (×2): qty 15

## 2013-07-12 MED ORDER — FLEET ENEMA 7-19 GM/118ML RE ENEM
1.0000 | ENEMA | Freq: Every day | RECTAL | Status: DC | PRN
  Administered 2013-07-12 – 2013-07-14 (×2): 1 via RECTAL
  Filled 2013-07-12 (×2): qty 1

## 2013-07-12 MED ORDER — FLEET ENEMA 7-19 GM/118ML RE ENEM: 1.0000 | ENEMA | Freq: Every day | RECTAL | Status: DC | PRN

## 2013-07-12 MED ORDER — SALINE SPRAY 0.65 % NA SOLN
1.0000 | NASAL | Status: DC | PRN
  Filled 2013-07-12: qty 44

## 2013-07-12 NOTE — Progress Notes (Signed)
PT Cancellation Note  Patient Details Name: Tina [name] MRN: [id number] DOB: 04/20/1931   Cancelled Treatment:    Reason Eval/Treat Not Completed: Patient declined, no reason specified.  Pt adamantly refusing any mobility at this time until someone gets her a new nasal spray.  RN made aware.     Reyhan Moronta, Alison MurrayMegan F 07/12/2013, 11:41 AM

## 2013-07-12 NOTE — Clinical Social Work Note (Signed)
CSW presented bed offers to pt. Pt has chosen [facility] for her SNF placement once medically stable for discharge. CSW currently awaiting MD confirmation regarding pt's readiness for [facility]. CSW will continue to follow and assist with discharge planning needs.  Darlyn ChamberEmily [place], LCSWA Clinical Social Worker [phone]

## 2013-07-12 NOTE — Progress Notes (Signed)
Pt c/o chest pain, EKG was done which was not indicative of any acute findings. V/S were bp - 138/45, 95% on room air, resp - 18, pulse - 94. Rapid response RN notified. Pt received 2 tabs of NORCO 10/325, which helped to subside her pain. Will continue to monitor.

## 2013-07-12 NOTE — Progress Notes (Signed)
Patient ID: Tina [name], female   DOB: 05/17/1931, 80 y.o.   MRN: [id number] Foley in place. Urine dark orange, cloudy. No enema given. Plan u/a, enema

## 2013-07-12 NOTE — Progress Notes (Signed)
Stable, still upset about her treatment  2 nights ago. Bladder working well. C/o constipation, no weakness. Seen by rehab. SNF was recommended. No family around to talk to

## 2013-07-13 LAB — GLUCOSE, CAPILLARY
GLUCOSE-CAPILLARY: 125 mg/dL — AB (ref 70–99)
Glucose-Capillary: 99 mg/dL (ref 70–99)
Glucose-Capillary: 111 mg/dL — ABNORMAL HIGH (ref 70–99)
Glucose-Capillary: 154 mg/dL — ABNORMAL HIGH (ref 70–99)

## 2013-07-13 LAB — TROPONIN I: Troponin I: <0.3 ng/mL (ref <0.30)

## 2013-07-13 LAB — URINE CULTURE
Colony Count: NO GROWTH
Culture: NO GROWTH

## 2013-07-13 MED ORDER — ALUM & MAG HYDROXIDE-SIMETH 200-200-20 MG/5ML PO SUSP
30.0000 mL | ORAL | Status: DC | PRN
  Administered 2013-07-13 – 2013-07-14 (×2): 30 mL via ORAL
  Filled 2013-07-13 (×2): qty 30

## 2013-07-13 MED ORDER — MAGNESIUM HYDROXIDE 400 MG/5ML PO SUSP
15.0000 mL | Freq: Every day | ORAL | Status: DC | PRN
  Administered 2013-07-13: 15 mL via ORAL
  Filled 2013-07-13: qty 30

## 2013-07-13 MED ORDER — OXYCODONE-ACETAMINOPHEN 5-325 MG PO TABS
1.0000 | ORAL_TABLET | ORAL | Status: DC | PRN
  Administered 2013-07-13 (×2): 2 via ORAL
  Administered 2013-07-15 – 2013-07-16 (×2): 1 via ORAL
  Filled 2013-07-13: qty 2
  Filled 2013-07-13: qty 1
  Filled 2013-07-13: qty 2
  Filled 2013-07-13: qty 1

## 2013-07-13 MED ORDER — SULFAMETHOXAZOLE-TRIMETHOPRIM 400-80 MG PO TABS
1.0000 | ORAL_TABLET | Freq: Two times a day (BID) | ORAL | Status: DC
  Administered 2013-07-13 – 2013-07-15 (×5): 1 via ORAL
  Filled 2013-07-13 (×7): qty 1

## 2013-07-13 NOTE — Progress Notes (Signed)
Patient ID: Tina [name], female   DOB: 04/13/1931, 80 y.o.   MRN: [id number] Chest pain last night for the second night in a row.cardiac enzymes are negative.urine posittve for bacteria. Culture pending. Had a bm . Still lumbar pain

## 2013-07-13 NOTE — Progress Notes (Signed)
Patient was having severe chest pain on left side non radiating. Vitals taken, BP 140/48, Heart rate 81, 99% on room air. Notified Dr. Jeral FruitBotero. Received order for STAT EKG , labs for Troponin and Vicodin 10 mg PO now for pain.  EKG done, Vicodin given and notified lab about troponin.

## 2013-07-14 LAB — GLUCOSE, CAPILLARY
GLUCOSE-CAPILLARY: 110 mg/dL — AB (ref 70–99)
GLUCOSE-CAPILLARY: 60 mg/dL — AB (ref 70–99)
Glucose-Capillary: 76 mg/dL (ref 70–99)
Glucose-Capillary: 110 mg/dL — ABNORMAL HIGH (ref 70–99)
Glucose-Capillary: 106 mg/dL — ABNORMAL HIGH (ref 70–99)

## 2013-07-14 MED ORDER — BISACODYL 10 MG RE SUPP: 10.0000 mg | Freq: Every day | RECTAL | Status: DC | PRN

## 2013-07-14 MED ORDER — SENNOSIDES-DOCUSATE SODIUM 8.6-50 MG PO TABS
1.0000 | ORAL_TABLET | Freq: Two times a day (BID) | ORAL | Status: DC
  Administered 2013-07-14 – 2013-07-16 (×4): 1 via ORAL
  Filled 2013-07-14 (×4): qty 1

## 2013-07-14 NOTE — Progress Notes (Signed)
Foley has been removed and patient has voided post foley removal

## 2013-07-14 NOTE — Progress Notes (Signed)
Enema and senna given. [facility]

## 2013-07-14 NOTE — Progress Notes (Signed)
Pt OOB to chair 1 asst with walker. In chair with call bell in reach

## 2013-07-14 NOTE — Progress Notes (Signed)
Patient ID: Tina [name], female   DOB: 01/05/1931, 80 y.o.   MRN: [id number] Urine negative for infection. Did not get an enama yesterday. Less pain.

## 2013-07-15 LAB — GLUCOSE, CAPILLARY
GLUCOSE-CAPILLARY: 95 mg/dL (ref 70–99)
Glucose-Capillary: 66 mg/dL — ABNORMAL LOW (ref 70–99)

## 2013-07-15 NOTE — Progress Notes (Signed)
Patient ID: Tina [name], female   DOB: 09/01/1930, 80 y.o.   MRN: [id number] Much better, lucid voiding in her own. No fever. To SNF in am

## 2013-07-15 NOTE — Progress Notes (Signed)
Inpatient Diabetes Program Recommendations  AACE/ADA: New Consensus Statement on Inpatient Glycemic Control (2013)  Target Ranges:  Prepandial:   less than 140 mg/dL      Peak postprandial:   less than 180 mg/dL (1-2 hours)      Critically ill patients:  140 - 180 mg/dL   Hypoglycemia this am (66 mg/dL) PO intake is unpredictable for this patient on any day.  Amaryl is a sulfonylurea that can be effective in lowering glucose for 12-24 hrs.  Inpatient Diabetes Program Recommendations Oral Agents: Either discontinue Amaryl or decrease to 2 mg while here  Thank you, Lenor CoffinAnn Alec Jaros, RN, CNS, Diabetes Coordinator [phone])

## 2013-07-15 NOTE — Progress Notes (Signed)
Physical Therapy Treatment Patient Details Name: Tina [name] MRN: [id number] DOB: 04/19/1931 Today's Date: 07/15/2013 Time: [id number] PT Time Calculation (min): 18 min  PT Assessment / Plan / Recommendation  History of Present Illness 80 y.o. s/p LUMBAR LAMINECTOMY/DECOMPRESSION MICRODISCECTOMY LUMBAR TWO TO LUMBAR FIVE (N/A)   PT Comments   Pt much more pleasant and very agreeable to PT.  Pt does need cueing for safety and encouragement.  Still feel SNF is safest D/C option at this time.  Will continue to follow.    Follow Up Recommendations  SNF     Does the patient have the potential to tolerate intense rehabilitation     Barriers to Discharge        Equipment Recommendations  Rolling walker with 5" wheels    Recommendations for Other Services    Frequency Min 5X/week   Progress towards PT Goals Progress towards PT goals: Progressing toward goals  Plan Current plan remains appropriate    Precautions / Restrictions Precautions Precautions: Back;Fall Precaution Booklet Issued: Yes (comment) Precaution Comments: Reviewed precautions with pt Restrictions Weight Bearing Restrictions: No   Pertinent Vitals/Pain Back is stiff.  Pt indicates premedicated.      Mobility  Transfers Overall transfer level: Needs assistance Equipment used: Rolling walker (2 wheeled) Transfers: Sit to/from Stand Sit to Stand: Min guard General transfer comment: cues for UE use and controlling descent to sitting.   Ambulation/Gait Ambulation/Gait assistance: Min guard Ambulation Distance (Feet): 200 Feet Assistive device: Rolling walker (2 wheeled) Gait Pattern/deviations: Step-through pattern;Decreased stride length;Shuffle;Trunk flexed General Gait Details: cues for positioning within RW, upright posture, back precautions with turns.      Exercises     PT Diagnosis:    PT Problem List:   PT Treatment Interventions:     PT Goals (current goals can now be found in the care plan  section) Acute Rehab PT Goals Time For Goal Achievement: 07/17/13 Potential to Achieve Goals: Good  Visit Information  Last PT Received On: 07/15/13 Assistance Needed: +1 History of Present Illness: 80 y.o. s/p LUMBAR LAMINECTOMY/DECOMPRESSION MICRODISCECTOMY LUMBAR TWO TO LUMBAR FIVE (N/A)    Subjective Data  Subjective: "I'd like to get up and walk."     Cognition  Cognition Arousal/Alertness: Awake/alert Behavior During Therapy: Anxious Overall Cognitive Status: No family/caregiver present to determine baseline cognitive functioning Memory: Decreased short-term memory;Decreased recall of precautions    Balance  Balance Overall balance assessment: Needs assistance Standing balance support: Bilateral upper extremity supported Standing balance-Leahy Scale: Fair  End of Session PT - End of Session Equipment Utilized During Treatment: Gait belt Activity Tolerance: Patient tolerated treatment well Patient left: in chair;with call bell/phone within reach Nurse Communication: Mobility status   GP     Sunny SchleinRitenour, Yen Wandell F, South CarolinaPT [phone] 07/15/2013, 11:50 AM

## 2013-07-15 NOTE — Progress Notes (Signed)
Patient very agitated with PAS hose, stated " I don't want to be awakened by them beeping.  Changed machine & wraps, finally continuing to complain & left PAS off.  Pt denied pain, argumentative in having to wait for portable equipment to bring new equipment, this writer left patient after repositioning and sliding her up in bed and did not distrub her 4712mn-6am in hopes uninterruped sleep would benefit her.  Suggest Chaplain consult for additional emotional support.  Guy Francoindy Tyheim [place] RN

## 2013-07-15 NOTE — Discharge Summary (Signed)
Physician Discharge Summary  Patient ID: Tina [name] MRN: [id number] DOB/AGE: 78/08/1930 78 y.o.  Admit date: 07/09/2013 Discharge date: 07/15/2013  Admission Diagnoses: lumbar stenosis  Discharge Diagnoses:  Active Problems:   Lumbar stenosis with neurogenic claudication   Discharged Condition: no weaknesas. por physical activity  Hospital Course: surgery  Consults: rehab medicine  Significant Diagnostic Studies: mri  Treatments: decomprssive laminectomies  Discharge Exam: Blood pressure 117/54, pulse 82, temperature 97.9 F (36.6 C), temperature source Oral, resp. rate 20, height 5\' 3"  (1.6 m), weight 107.502 kg (237 lb), SpO2 99.00%. No weakness. Bladder working well  Disposition:  SNF. To be seen by me in 4 to 6 weeks.   Future Appointments Provider Department Dept Phone   08/30/2013 1:00 PM [facility] [address] [phone]   08/30/2013 1:40 PM Carma LairSuzanne L Nickel, NP [facility] [phone]       Medication List    ASK your doctor about these medications       amLODipine 10 MG tablet  Commonly known as:  NORVASC  Take 10 mg by mouth daily.     colesevelam 625 MG tablet  Commonly known as:  WELCHOL  Take 1,875 mg by mouth 2 (two) times daily with a meal.     ezetimibe 10 MG tablet  Commonly known as:  ZETIA  Take 10 mg by mouth daily.     lisinopril 10 MG tablet  Commonly known as:  PRINIVIL,ZESTRIL  Take 10 mg by mouth daily.     omeprazole 40 MG capsule  Commonly known as:  PRILOSEC  Take 40 mg by mouth daily.     pioglitazone-glimepiride 30-4 MG per tablet  Commonly known as:  DUETACT  Take 1 tablet by mouth daily with breakfast.     rosuvastatin 20 MG tablet  Commonly known as:  CRESTOR  Take 20 mg by mouth daily.     sitaGLIPtin 100 MG tablet  Commonly known as:  JANUVIA  Take 100 mg by mouth daily.         Signed: Karn CassisBOTERO,Johntay Doolen M 07/15/2013, 5:02 PM

## 2013-07-15 NOTE — Progress Notes (Signed)
Chaplain visited with pt regarding consult put forward.  Chaplain and pt spent time talking about her views on religion and some questions regarding chaplaincy.  Pt seemed calm and responsive and appeared grateful for the visit.

## 2013-07-16 LAB — BASIC METABOLIC PANEL
BUN: 12 mg/dL (ref 6–23)
CO2: 21 mEq/L (ref 19–32)
Calcium: 9 mg/dL (ref 8.4–10.5)
Chloride: 110 mEq/L (ref 96–112)
Creatinine, Ser: 0.9 mg/dL (ref 0.50–1.10)
GFR calc Af Amer: 68 mL/min — ABNORMAL LOW (ref >90)
GFR, EST NON AFRICAN AMERICAN: 59 mL/min — AB (ref >90)
Glucose, Bld: 73 mg/dL (ref 70–99)
POTASSIUM: 4.5 meq/L (ref 3.7–5.3)
Sodium: 143 mEq/L (ref 137–147)

## 2013-07-16 NOTE — Clinical Social Work Note (Signed)
CSW received call from [facility] stating that they are able to accept pt today (07/16/2013). CSW has sent discharge paperwork (summary and AVS) to SNF. CSW to complete discharge packet and place on pt's shadow chart. CSW will assist in providing transportation for pt to be picked-up by ambulance at 1pm on 07/16/2013. CSW has notified of RN regarding information above.  RN please [facility] [phone] [facility] (ambulance) [phone]  Darlyn ChamberEmily [place], East Cooper Medical CenterCSWA Clinical Social Worker [phone]

## 2013-07-16 NOTE — Progress Notes (Signed)
Occupational Therapy Treatment Patient Details Name: [name] Dadisman MRN: [id number] DOB: 08/29/1930 Today's Date: 07/16/2013 Time: [id number] OT Time Calculation (min): 16 min  OT Assessment / Plan / Recommendation  History of present illness 80 y.o. s/p LUMBAR LAMINECTOMY/DECOMPRESSION MICRODISCECTOMY LUMBAR TWO TO LUMBAR FIVE (N/A)   OT comments  Discussed/demonstrated use of AE for LB ADLs and provided education to pt. Pt planning to d/c to SNF.   Follow Up Recommendations  SNF    Barriers to Discharge       Equipment Recommendations  Other (comment) (defer to next venue)    Recommendations for Other Services    Frequency Min 2X/week   Progress towards OT Goals Progress towards OT goals: Progressing toward goals  Plan Discharge plan remains appropriate    Precautions / Restrictions Precautions Precautions: Back;Fall Precaution Comments: Reviewed precautions with pt Restrictions Weight Bearing Restrictions: No   Pertinent Vitals/Pain Pain 2/10. Increased activity during session.     ADL  Toilet Transfer: Hydrographic surveyorMin guard Toilet Transfer Method: Sit to Baristastand Toilet Transfer Equipment: Other (comment) Administrator, Civil Service(recliner chair) Equipment Used: Sock aid;Reacher;Rolling walker Transfers/Ambulation Related to ADLs: Min guard ADL Comments: Educated on use of two cups for teeth care as well as having items on right side of sink to avoid breaking precautions. Discussed and tried to problem solve how she was going to feed and water her cat while maintaining precautions. OT educated on use of sockaid and reacher as pt states she has to bend to use her sockaid, but she would not try with OT during session. Educated on dressing technique.    OT Diagnosis:    OT Problem List:   OT Treatment Interventions:     OT Goals(current goals can now be found in the care plan section) Acute Rehab OT Goals Patient Stated Goal: not stated OT Goal Formulation: With patient Time For Goal Achievement:  07/17/13 Potential to Achieve Goals: Good ADL Goals Pt Will Perform Grooming: with set-up;standing Pt Will Perform Lower Body Bathing: with set-up;with supervision;sit to/from stand;with adaptive equipment Pt Will Perform Lower Body Dressing: with set-up;with supervision;with adaptive equipment;sit to/from stand Pt Will Transfer to Toilet: with supervision;ambulating;grab bars (comfort height toilet) Pt Will Perform Toileting - Clothing Manipulation and hygiene: with supervision;sit to/from stand Pt Will Perform Tub/Shower Transfer: Shower transfer;with supervision;ambulating;shower seat;rolling walker Additional ADL Goal #1: Pt will independently verbalize and demonstrate 3/3 back precautions.   Visit Information  Last OT Received On: 07/16/13 Assistance Needed: +1 History of Present Illness: 80 y.o. s/p LUMBAR LAMINECTOMY/DECOMPRESSION MICRODISCECTOMY LUMBAR TWO TO LUMBAR FIVE (N/A)    Subjective Data      Prior Functioning       Cognition  Cognition Arousal/Alertness: Awake/alert Behavior During Therapy: WFL for tasks assessed/performed Overall Cognitive Status: Within Functional Limits for tasks assessed    Mobility  Transfers Overall transfer level: Needs assistance Equipment used: Rolling walker (2 wheeled) Transfers: Sit to/from Stand Sit to Stand: Min guard General transfer comment: cues for technique.    Exercises      Balance    End of Session OT - End of Session Equipment Utilized During Treatment: Gait belt;Rolling walker Activity Tolerance: Patient tolerated treatment well Patient left: in chair;with call bell/phone within reach  GO     Earlie RavelingStraub, Hansen Carino L OTR/L [phone] 07/16/2013, 1:51 PM

## 2013-07-16 NOTE — Progress Notes (Signed)
Physical Therapy Treatment Patient Details Name: Tina [name] MRN: [id number] DOB: 05/02/1931 Today's Date: 07/16/2013 Time: [id number] PT Time Calculation (min): 24 min  PT Assessment / Plan / Recommendation  History of Present Illness 80 y.o. s/p LUMBAR LAMINECTOMY/DECOMPRESSION MICRODISCECTOMY LUMBAR TWO TO LUMBAR FIVE (N/A)   PT Comments   Progressing towards goals. Reveiwed precautions and maintaining those with mobility. Patient voicing concerns about house chores and feeding cat once home. Patient also concerned with showering and dressing. Will notify OT  Follow Up Recommendations  SNF     Does the patient have the potential to tolerate intense rehabilitation     Barriers to Discharge        Equipment Recommendations  Rolling walker with 5" wheels    Recommendations for Other Services    Frequency Min 5X/week   Progress towards PT Goals Progress towards PT goals: Progressing toward goals  Plan Current plan remains appropriate    Precautions / Restrictions Precautions Precautions: Back;Fall Precaution Comments: Reviewed precautions with pt Restrictions Weight Bearing Restrictions: No   Pertinent Vitals/Pain no apparent distress     Mobility  Bed Mobility Rolling: Min guard Sidelying to sit: Min guard;HOB elevated General bed mobility comments: Cues to ensure log roll technique. Patient relying on rails at this time Transfers Equipment used: Rolling walker (2 wheeled) Sit to Stand: Min guard General transfer comment: cues for UE use and controlling descent to sitting.   Ambulation/Gait Ambulation/Gait assistance: Min guard Ambulation Distance (Feet): 200 Feet Assistive device: Rolling walker (2 wheeled) Gait Pattern/deviations: Step-through pattern;Decreased stride length;Shuffle General Gait Details: cues for positioning within RW, upright posture. Several standing rest break. Patient fatigues quickly but motivated to push through     Exercises     PT  Diagnosis:    PT Problem List:   PT Treatment Interventions:     PT Goals (current goals can now be found in the care plan section)    Visit Information  Last PT Received On: 07/16/13 Assistance Needed: +1 History of Present Illness: 80 y.o. s/p LUMBAR LAMINECTOMY/DECOMPRESSION MICRODISCECTOMY LUMBAR TWO TO LUMBAR FIVE (N/A)    Subjective Data      Cognition  Cognition Arousal/Alertness: Awake/alert Behavior During Therapy: WFL for tasks assessed/performed Overall Cognitive Status: No family/caregiver present to determine baseline cognitive functioning Memory: Decreased short-term memory;Decreased recall of precautions    Balance     End of Session PT - End of Session Equipment Utilized During Treatment: Gait belt Activity Tolerance: Patient tolerated treatment well Patient left: in chair;with call bell/phone within reach Nurse Communication: Mobility status   GP     Fredrich BirksRobinette, Jaxxson Cavanah Elizabeth 07/16/2013, 9:21 AM 07/16/2013 Fredrich Birksobinette, Arelia Volpe Elizabeth PTA [phone] pager [phone] office

## 2013-07-16 NOTE — Progress Notes (Signed)
Report called to [facility] [place] [facility] facility.

## 2013-08-30 ENCOUNTER — Ambulatory Visit: Payer: Medicare Other | Admitting: Family

## 2013-08-30 ENCOUNTER — Encounter (HOSPITAL_COMMUNITY): Payer: Medicare Other

## 2013-09-10 LAB — HM DIABETES EYE EXAM

## 2013-10-03 ENCOUNTER — Encounter: Payer: Self-pay | Admitting: Family

## 2013-10-04 ENCOUNTER — Encounter (HOSPITAL_COMMUNITY): Payer: Medicare Other

## 2013-10-04 ENCOUNTER — Ambulatory Visit: Payer: Medicare Other | Admitting: Family

## 2013-10-09 DIAGNOSIS — Y92009 Unspecified place in unspecified non-institutional (private) residence as the place of occurrence of the external cause: Secondary | ICD-10-CM

## 2013-10-09 DIAGNOSIS — W19XXXA Unspecified fall, initial encounter: Secondary | ICD-10-CM

## 2013-10-09 HISTORY — DX: Unspecified place in unspecified non-institutional (private) residence as the place of occurrence of the external cause: Y92.009

## 2013-10-09 HISTORY — DX: Unspecified fall, initial encounter: W19.XXXA

## 2013-10-10 ENCOUNTER — Encounter: Payer: Self-pay | Admitting: Family

## 2013-10-11 ENCOUNTER — Encounter: Payer: Self-pay | Admitting: Family

## 2013-10-11 ENCOUNTER — Ambulatory Visit (INDEPENDENT_AMBULATORY_CARE_PROVIDER_SITE_OTHER): Payer: Medicare Other | Admitting: Family

## 2013-10-11 ENCOUNTER — Ambulatory Visit (HOSPITAL_COMMUNITY)
Admission: RE | Admit: 2013-10-11 | Discharge: 2013-10-11 | Disposition: A | Discharge status: Home / Self Care | Payer: Medicare Other | Source: Ambulatory Visit | Attending: Family | Admitting: Family

## 2013-10-11 VITALS — BP 133/56 | HR 78 | Resp 18 | Ht 63.5 in | Wt 232.0 lb

## 2013-10-11 DIAGNOSIS — I739 Peripheral vascular disease, unspecified: Secondary | ICD-10-CM | POA: Insufficient documentation

## 2013-10-11 DIAGNOSIS — I6529 Occlusion and stenosis of unspecified carotid artery: Secondary | ICD-10-CM

## 2013-10-11 DIAGNOSIS — I70219 Atherosclerosis of native arteries of extremities with intermittent claudication, unspecified extremity: Secondary | ICD-10-CM

## 2013-10-11 NOTE — Patient Instructions (Signed)
Peripheral Vascular Disease Peripheral Vascular Disease (PVD), also called Peripheral Arterial Disease (PAD), is a circulation problem caused by cholesterol (atherosclerotic plaque) deposits in the arteries. PVD commonly occurs in the lower extremities (legs) but it can occur in other areas of the body, such as your arms. The cholesterol buildup in the arteries reduces blood flow which can cause pain and other serious problems. The presence of PVD can place a person at risk for Coronary Artery Disease (CAD).  CAUSES  Causes of PVD can be many. It is usually associated with more than one risk factor such as:   High Cholesterol.  Smoking.  Diabetes.  Lack of exercise or inactivity.  High blood pressure (hypertension).  Obesity.  Family history. SYMPTOMS   When the lower extremities are affected, patients with PVD may experience:  Leg pain with exertion or physical activity. This is called INTERMITTENT CLAUDICATION. This may present as cramping or numbness with physical activity. The location of the pain is associated with the level of blockage. For example, blockage at the abdominal level (distal abdominal aorta) may result in buttock or hip pain. Lower leg arterial blockage may result in calf pain.  As PVD becomes more severe, pain can develop with less physical activity.  In people with severe PVD, leg pain may occur at rest.  Other PVD signs and symptoms:  Leg numbness or weakness.  Coldness in the affected leg or foot, especially when compared to the other leg.  A change in leg color.  Patients with significant PVD are more prone to ulcers or sores on toes, feet or legs. These may take longer to heal or may reoccur. The ulcers or sores can become infected.  If signs and symptoms of PVD are ignored, gangrene may occur. This can result in the loss of toes or loss of an entire limb.  Not all leg pain is related to PVD. Other medical conditions can cause leg pain such  as:  Blood clots (embolism) or Deep Vein Thrombosis.  Inflammation of the blood vessels (vasculitis).  Spinal stenosis. DIAGNOSIS  Diagnosis of PVD can involve several different types of tests. These can include:  Pulse Volume Recording Method (PVR). This test is simple, painless and does not involve the use of X-rays. PVR involves measuring and comparing the blood pressure in the arms and legs. An ABI (Ankle-Brachial Index) is calculated. The normal ratio of blood pressures is 1. As this number becomes smaller, it indicates more severe disease.  < 0.95  indicates significant narrowing in one or more leg vessels.  <0.8 there will usually be pain in the foot, leg or buttock with exercise.  <0.4 will usually have pain in the legs at rest.  <0.25  usually indicates limb threatening PVD.  Doppler detection of pulses in the legs. This test is painless and checks to see if you have a pulses in your legs/feet.  A dye or contrast material (a substance that highlights the blood vessels so they show up on x-ray) may be given to help your caregiver better see the arteries for the following tests. The dye is eliminated from your body by the kidney's. Your caregiver may order blood work to check your kidney function and other laboratory values before the following tests are performed:  Magnetic Resonance Angiography (MRA). An MRA is a picture study of the blood vessels and arteries. The MRA machine uses a large magnet to produce images of the blood vessels.  Computed Tomography Angiography (CTA). A CTA is a   specialized x-ray that looks at how the blood flows in your blood vessels. An IV may be inserted into your arm so contrast dye can be injected.  Angiogram. Is a procedure that uses x-rays to look at your blood vessels. This procedure is minimally invasive, meaning a small incision (cut) is made in your groin. A small tube (catheter) is then inserted into the artery of your groin. The catheter is  guided to the blood vessel or artery your caregiver wants to examine. Contrast dye is injected into the catheter. X-rays are then taken of the blood vessel or artery. After the images are obtained, the catheter is taken out. TREATMENT  Treatment of PVD involves many interventions which may include:  Lifestyle changes:  Quitting smoking.  Exercise.  Following a low fat, low cholesterol diet.  Control of diabetes.  Foot care is very important to the PVD patient. Good foot care can help prevent infection.  Medication:  Cholesterol-lowering medicine.  Blood pressure medicine.  Anti-platelet drugs.  Certain medicines may reduce symptoms of Intermittent Claudication.  Interventional/Surgical options:  Angioplasty. An Angioplasty is a procedure that inflates a balloon in the blocked artery. This opens the blocked artery to improve blood flow.  Stent Implant. A wire mesh tube (stent) is placed in the artery. The stent expands and stays in place, allowing the artery to remain open.  Peripheral Bypass Surgery. This is a surgical procedure that reroutes the blood around a blocked artery to help improve blood flow. This type of procedure may be performed if Angioplasty or stent implants are not an option. SEEK IMMEDIATE MEDICAL CARE IF:   You develop pain or numbness in your arms or legs.  Your arm or leg turns cold, becomes blue in color.  You develop redness, warmth, swelling and pain in your arms or legs. MAKE SURE YOU:   Understand these instructions.  Will watch your condition.  Will get help right away if you are not doing well or get worse. Document Released: 07/21/2004 Document Revised: 09/05/2011 Document Reviewed: 06/17/2008 Exit[employer] Patient Information 2014 [employer].  

## 2013-10-11 NOTE — Progress Notes (Signed)
[facility] [place] HISTORY AND PHYSICAL -PAD  History of Present Illness Tina [name] is a 80 y.o. female patient of Dr. Imogene Burnhen with no history of lower extremity intervention.  The patient states she does have lumbar pain which limits her activity. She returns today for routine ABI's. She denies claudication in legs with walking, denies rest pain. She has trouble raising right arm at her shoulder and this is also painful. She denies non healing wounds. Patient denies any history of stroke or TIA, states she has not had US of neck, no carotid Duplex results on file.  The patient reports New Medical or Surgical History: Had lumbar spine surgery for stenosis, January, 2015.  Pt Diabetic: Yes, states in very good control. Pt smoker: non-smoker  Pt meds include: Statin :Yes ASA: No, she took in the past, stopped taking, denies any adverse reaction to ASA Other anticoagulants/antiplatelets: no  Past Medical History  Diagnosis Date  . Diabetes mellitus   . Hypertension   . Osteopenia   . Peripheral vascular disease, unspecified   . GERD (gastroesophageal reflux disease)   . Arthritis   . Shortness of breath     exersion  . Pneumonia     child  . Anemia     hx  . Hepatitis     "a"  . Fall at home October 09, 2013    Social History History  Substance Use Topics  . Smoking status: Former Smoker -- 0.30 packs/day for 1 years    Types: Cigarettes    Quit date: 01/06/1961  . Smokeless tobacco: Never Used  . Alcohol Use: 4.4 oz/week    4 Glasses of wine per week    Family History Family History  Problem Relation Age of Onset  . Cancer Mother   . Hypertension Mother   . Heart disease Father     before age 78  . Heart attack Father   . Hyperlipidemia Son     Past Surgical History  Procedure Laterality Date  . Appendectomy  1994  . Eye surgery Bilateral 08  . Lumbar laminectomy/decompression microdiscectomy N/A 07/09/2013    Procedure: LUMBAR  LAMINECTOMY/DECOMPRESSION MICRODISCECTOMY LUMBAR TWO TO LUMBAR FIVE;  Surgeon: Karn CassisErnesto M Botero, MD;  Location: [facility];  Service: Neurosurgery;  Laterality: N/A;    No Known Allergies  Current Outpatient Prescriptions  Medication Sig Dispense Refill  . amLODipine (NORVASC) 10 MG tablet Take 10 mg by mouth daily.        . colesevelam (WELCHOL) 625 MG tablet Take 1,875 mg by mouth 2 (two) times daily with a meal.       . diclofenac (VOLTAREN) 75 MG EC tablet       . ezetimibe (ZETIA) 10 MG tablet Take 10 mg by mouth daily.        Marland Kitchen. lisinopril (PRINIVIL,ZESTRIL) 10 MG tablet Take 10 mg by mouth daily.        Marland Kitchen. omeprazole (PRILOSEC) 40 MG capsule Take 40 mg by mouth daily.        Marland Kitchen. oxyCODONE-acetaminophen (PERCOCET) 7.5-325 MG per tablet       . pioglitazone-glimepiride (DUETACT) 30-4 MG per tablet Take 1 tablet by mouth daily with breakfast.        . rosuvastatin (CRESTOR) 20 MG tablet Take 20 mg by mouth daily.        . sitaGLIPtin (JANUVIA) 100 MG tablet Take 100 mg by mouth daily.         No current  facility-administered medications for this visit.    ROS: See HPI for pertinent positives and negatives.   Physical Examination  Filed Vitals:   10/11/13 1431  BP: 133/56  Pulse: 78  Resp: 18   Filed Weights   10/11/13 1431  Weight: 232 lb (105.235 kg)   Body mass index is 40.45 kg/(m^2).  General: A&O x 3, WDWN, morbidly obese female. Gait: limp, using quad cane Eyes: PERRLA. Pulmonary: CTAB, without wheezes , rales or rhonchi. Cardiac: regular Rythm , without detected murmur.         Carotid Bruits Left Right   Positive Negative  Aorta is not palpable. Radial pulses: 1+ palpable and =.                           VASCULAR EXAM: Extremities without ischemic changes  without Gangrene; without open wounds.                                                                                                          LE Pulses LEFT RIGHT       POPLITEAL  not palpable    not palpable       POSTERIOR TIBIAL  not palpable   not palpable        DORSALIS PEDIS      ANTERIOR TIBIAL not palpable  not palpable    Abdomen: soft, NT, no masses. Skin: no rashes, no ulcers noted. Musculoskeletal: no muscle wasting or atrophy.  Neurologic: A&O X 3; Appropriate Affect ; SENSATION: normal; MOTOR FUNCTION:  moving all extremities equally, motor strength 5/5 throughout. Speech is fluent/normal. CN 2-12 grossly intact.    Non-Invasive Vascular Imaging: DATE: 10/11/2013 ABI: Waveforms: all triphasic Non compressible vessels  ASSESSMENT: Tina CoopShirley A [name] is a 80 y.o. female who has no history of lower extremity intervention. The patient states she does have lumbar pain which limits her activity. She denies claudication in legs with walking. She has trouble raising right arm at her shoulder and this is also painful. She denies non healing wounds. Patient denies any history of stroke or TIA, states she has not had US of neck, no carotid Duplex results on file. She has non compressible arteries, possibly calcified secondary to DM,  unable to obtain ABI's, but all waveforms are triphasic.   Bruit auscultated over left carotid artery, however she has no known history of stroke or TIA,  will obtain carotid Duplex on her return in 1 year.    PLAN:  Resume daily 81 mg ASA. I discussed in depth with the patient the nature of atherosclerosis, and emphasized the importance of maximal medical management including strict control of blood pressure, blood glucose, and lipid levels, obtaining regular exercise, and continued cessation of smoking.  The patient is aware that without maximal medical management the underlying atherosclerotic disease process will progress, limiting the benefit of any interventions.  Based on the patient's vascular studies and examination, pt will return to clinic in 1 year for ABI's and carotid Duplex to evaluate left  carotid bruit.  The patient was  given information about PAD including signs, symptoms, treatment, what symptoms should prompt the patient to seek immediate medical care, and risk reduction measures to take.  Charisse March, RN, MSN, FNP-C [facility] Phone: [phone]  Clinic MD: Imogene Burn  10/11/2013 2:50 PM

## 2013-10-14 NOTE — Addendum Note (Signed)
Addended by: Sharee PimpleMCCHESNEY, Chella Chapdelaine K on: 10/14/2013 06:59 PM   Modules accepted: Orders

## 2014-02-26 ENCOUNTER — Other Ambulatory Visit (HOSPITAL_COMMUNITY): Payer: Self-pay | Admitting: Neurosurgery

## 2014-02-26 DIAGNOSIS — IMO0002 Reserved for concepts with insufficient information to code with codable children: Secondary | ICD-10-CM

## 2014-03-04 ENCOUNTER — Ambulatory Visit (HOSPITAL_COMMUNITY)
Admission: RE | Admit: 2014-03-04 | Discharge: 2014-03-04 | Disposition: A | Discharge status: Home / Self Care | Payer: Medicare Other | Source: Ambulatory Visit | Attending: Neurosurgery | Admitting: Neurosurgery

## 2014-03-04 DIAGNOSIS — M47817 Spondylosis without myelopathy or radiculopathy, lumbosacral region: Secondary | ICD-10-CM | POA: Diagnosis not present

## 2014-03-04 DIAGNOSIS — M5380 Other specified dorsopathies, site unspecified: Secondary | ICD-10-CM | POA: Diagnosis not present

## 2014-03-04 DIAGNOSIS — M5126 Other intervertebral disc displacement, lumbar region: Secondary | ICD-10-CM | POA: Insufficient documentation

## 2014-03-04 DIAGNOSIS — M51379 Other intervertebral disc degeneration, lumbosacral region without mention of lumbar back pain or lower extremity pain: Secondary | ICD-10-CM | POA: Insufficient documentation

## 2014-03-04 DIAGNOSIS — M48061 Spinal stenosis, lumbar region without neurogenic claudication: Secondary | ICD-10-CM | POA: Diagnosis not present

## 2014-03-04 DIAGNOSIS — E119 Type 2 diabetes mellitus without complications: Secondary | ICD-10-CM | POA: Diagnosis not present

## 2014-03-04 DIAGNOSIS — M545 Low back pain, unspecified: Secondary | ICD-10-CM | POA: Diagnosis present

## 2014-03-04 DIAGNOSIS — IMO0002 Reserved for concepts with insufficient information to code with codable children: Secondary | ICD-10-CM

## 2014-03-04 DIAGNOSIS — M5137 Other intervertebral disc degeneration, lumbosacral region: Secondary | ICD-10-CM | POA: Insufficient documentation

## 2014-03-12 ENCOUNTER — Other Ambulatory Visit: Payer: Self-pay | Admitting: Neurosurgery

## 2014-03-12 DIAGNOSIS — M5416 Radiculopathy, lumbar region: Secondary | ICD-10-CM

## 2014-03-17 ENCOUNTER — Ambulatory Visit
Admission: RE | Admit: 2014-03-17 | Discharge: 2014-03-17 | Disposition: A | Discharge status: Home / Self Care | Payer: Medicare Other | Source: Ambulatory Visit | Attending: Neurosurgery | Admitting: Neurosurgery

## 2014-03-17 VITALS — BP 202/72 | HR 88

## 2014-03-17 DIAGNOSIS — M5416 Radiculopathy, lumbar region: Secondary | ICD-10-CM

## 2014-03-17 MED ORDER — IOHEXOL 180 MG/ML  SOLN
1.0000 mL | Freq: Once | INTRAMUSCULAR | Status: AC | PRN
  Administered 2014-03-17: 1 mL via EPIDURAL

## 2014-03-17 MED ORDER — METHYLPREDNISOLONE ACETATE 40 MG/ML INJ SUSP (RADIOLOG
120.0000 mg | Freq: Once | INTRAMUSCULAR | Status: AC
  Administered 2014-03-17: 120 mg via EPIDURAL

## 2014-03-17 NOTE — Discharge Instructions (Signed)

## 2014-03-31 ENCOUNTER — Other Ambulatory Visit: Payer: Self-pay | Admitting: Neurosurgery

## 2014-03-31 DIAGNOSIS — M5416 Radiculopathy, lumbar region: Secondary | ICD-10-CM

## 2014-04-02 ENCOUNTER — Other Ambulatory Visit: Payer: Medicare Other

## 2014-04-03 ENCOUNTER — Other Ambulatory Visit: Payer: Medicare Other

## 2014-04-15 ENCOUNTER — Ambulatory Visit
Admission: RE | Admit: 2014-04-15 | Discharge: 2014-04-15 | Disposition: A | Discharge status: Home / Self Care | Payer: Medicare Other | Source: Ambulatory Visit | Attending: Neurosurgery | Admitting: Neurosurgery

## 2014-04-15 DIAGNOSIS — M5416 Radiculopathy, lumbar region: Secondary | ICD-10-CM

## 2014-04-15 MED ORDER — IOHEXOL 180 MG/ML  SOLN
1.0000 mL | Freq: Once | INTRAMUSCULAR | Status: AC | PRN
  Administered 2014-04-15: 1 mL via EPIDURAL

## 2014-04-15 MED ORDER — METHYLPREDNISOLONE ACETATE 40 MG/ML INJ SUSP (RADIOLOG
120.0000 mg | Freq: Once | INTRAMUSCULAR | Status: AC
  Administered 2014-04-15: 120 mg via EPIDURAL

## 2014-04-21 ENCOUNTER — Other Ambulatory Visit: Payer: Self-pay | Admitting: Neurosurgery

## 2014-04-21 DIAGNOSIS — M545 Low back pain: Principal | ICD-10-CM

## 2014-04-21 DIAGNOSIS — G8929 Other chronic pain: Secondary | ICD-10-CM

## 2014-05-02 ENCOUNTER — Ambulatory Visit
Admission: RE | Admit: 2014-05-02 | Discharge: 2014-05-02 | Disposition: A | Discharge status: Home / Self Care | Payer: Medicare Other | Source: Ambulatory Visit | Attending: Neurosurgery | Admitting: Neurosurgery

## 2014-05-02 DIAGNOSIS — M545 Low back pain: Secondary | ICD-10-CM

## 2014-05-02 DIAGNOSIS — M79605 Pain in left leg: Principal | ICD-10-CM

## 2014-05-02 DIAGNOSIS — G8929 Other chronic pain: Secondary | ICD-10-CM

## 2014-05-02 DIAGNOSIS — M79604 Pain in right leg: Secondary | ICD-10-CM

## 2014-05-02 MED ORDER — METHYLPREDNISOLONE ACETATE 40 MG/ML INJ SUSP (RADIOLOG
120.0000 mg | Freq: Once | INTRAMUSCULAR | Status: AC
  Administered 2014-05-02: 120 mg via EPIDURAL

## 2014-05-02 MED ORDER — IOHEXOL 180 MG/ML  SOLN
1.0000 mL | Freq: Once | INTRAMUSCULAR | Status: AC | PRN
  Administered 2014-05-02: 1 mL via EPIDURAL

## 2014-08-18 ENCOUNTER — Emergency Department (HOSPITAL_COMMUNITY)
Admission: EM | Admit: 2014-08-18 | Discharge: 2014-08-18 | Disposition: A | Discharge status: Home / Self Care | Payer: Medicare Other | Attending: Emergency Medicine | Admitting: Emergency Medicine

## 2014-08-18 ENCOUNTER — Encounter (HOSPITAL_COMMUNITY): Payer: Self-pay

## 2014-08-18 DIAGNOSIS — Z87828 Personal history of other (healed) physical injury and trauma: Secondary | ICD-10-CM | POA: Insufficient documentation

## 2014-08-18 DIAGNOSIS — I739 Peripheral vascular disease, unspecified: Secondary | ICD-10-CM | POA: Insufficient documentation

## 2014-08-18 DIAGNOSIS — Z8619 Personal history of other infectious and parasitic diseases: Secondary | ICD-10-CM | POA: Diagnosis not present

## 2014-08-18 DIAGNOSIS — I10 Essential (primary) hypertension: Secondary | ICD-10-CM | POA: Insufficient documentation

## 2014-08-18 DIAGNOSIS — Z9889 Other specified postprocedural states: Secondary | ICD-10-CM | POA: Diagnosis not present

## 2014-08-18 DIAGNOSIS — M1990 Unspecified osteoarthritis, unspecified site: Secondary | ICD-10-CM | POA: Diagnosis not present

## 2014-08-18 DIAGNOSIS — Z87891 Personal history of nicotine dependence: Secondary | ICD-10-CM | POA: Diagnosis not present

## 2014-08-18 DIAGNOSIS — Z8701 Personal history of pneumonia (recurrent): Secondary | ICD-10-CM | POA: Diagnosis not present

## 2014-08-18 DIAGNOSIS — E119 Type 2 diabetes mellitus without complications: Secondary | ICD-10-CM | POA: Diagnosis not present

## 2014-08-18 DIAGNOSIS — Z862 Personal history of diseases of the blood and blood-forming organs and certain disorders involving the immune mechanism: Secondary | ICD-10-CM | POA: Diagnosis not present

## 2014-08-18 DIAGNOSIS — M5430 Sciatica, unspecified side: Secondary | ICD-10-CM | POA: Diagnosis not present

## 2014-08-18 DIAGNOSIS — M79606 Pain in leg, unspecified: Secondary | ICD-10-CM | POA: Diagnosis present

## 2014-08-18 DIAGNOSIS — K219 Gastro-esophageal reflux disease without esophagitis: Secondary | ICD-10-CM | POA: Diagnosis not present

## 2014-08-18 DIAGNOSIS — Z79899 Other long term (current) drug therapy: Secondary | ICD-10-CM | POA: Diagnosis not present

## 2014-08-18 LAB — COMPREHENSIVE METABOLIC PANEL
ALK PHOS: 59 U/L (ref 39–117)
ALT: 29 U/L (ref 0–35)
AST: 23 U/L (ref 0–37)
Albumin: 3.6 g/dL (ref 3.5–5.2)
Anion gap: 4 — ABNORMAL LOW (ref 5–15)
BILIRUBIN TOTAL: 0.7 mg/dL (ref 0.3–1.2)
BUN: 27 mg/dL — ABNORMAL HIGH (ref 6–23)
CO2: 20 mmol/L (ref 19–32)
Calcium: 9.4 mg/dL (ref 8.4–10.5)
Chloride: 118 mmol/L — ABNORMAL HIGH (ref 96–112)
Creatinine, Ser: 0.96 mg/dL (ref 0.50–1.10)
GFR calc non Af Amer: 54 mL/min — ABNORMAL LOW (ref >90)
GFR, EST AFRICAN AMERICAN: 63 mL/min — AB (ref >90)
Glucose, Bld: 92 mg/dL (ref 70–99)
POTASSIUM: 5.3 mmol/L — AB (ref 3.5–5.1)
Sodium: 142 mmol/L (ref 135–145)
TOTAL PROTEIN: 6.4 g/dL (ref 6.0–8.3)

## 2014-08-18 LAB — URINALYSIS, ROUTINE W REFLEX MICROSCOPIC
BILIRUBIN URINE: NEGATIVE
Glucose, UA: NEGATIVE mg/dL
Hgb urine dipstick: NEGATIVE
Leukocytes, UA: NEGATIVE
Nitrite: NEGATIVE
PH: 5.5 (ref 5.0–8.0)
PROTEIN: NEGATIVE mg/dL
SPECIFIC GRAVITY, URINE: 1.025 (ref 1.005–1.030)
Urobilinogen, UA: 0.2 mg/dL (ref 0.0–1.0)

## 2014-08-18 LAB — CBC
HEMATOCRIT: 37.3 % (ref 36.0–46.0)
Hemoglobin: 11.8 g/dL — ABNORMAL LOW (ref 12.0–15.0)
MCH: 33.8 pg (ref 26.0–34.0)
MCHC: 31.6 g/dL (ref 30.0–36.0)
MCV: 106.9 fL — AB (ref 78.0–100.0)
Platelets: 260 10*3/uL (ref 150–400)
RBC: 3.49 MIL/uL — ABNORMAL LOW (ref 3.87–5.11)
RDW: 14.2 % (ref 11.5–15.5)
WBC: 10.7 10*3/uL — ABNORMAL HIGH (ref 4.0–10.5)

## 2014-08-18 LAB — CBG MONITORING, ED: Glucose-Capillary: 83 mg/dL (ref 70–99)

## 2014-08-18 MED ORDER — MORPHINE SULFATE 4 MG/ML IJ SOLN
2.0000 mg | Freq: Once | INTRAMUSCULAR | Status: AC
  Administered 2014-08-18: 2 mg via INTRAVENOUS
  Filled 2014-08-18: qty 1

## 2014-08-18 MED ORDER — ONDANSETRON HCL 4 MG/2ML IJ SOLN
4.0000 mg | Freq: Once | INTRAMUSCULAR | Status: AC
  Administered 2014-08-18: 4 mg via INTRAVENOUS
  Filled 2014-08-18: qty 2

## 2014-08-18 NOTE — Care Management Note (Signed)
Patient suffers from arthritis which impairs their ability to perform daily activities like walking in the home. A walker will not resolve  issue with performing activities of daily living. A wheelchair will allow patient to safely perform daily activities. Patient can safely propel the wheelchair in the home or has a caregiver who can provide assistance.

## 2014-08-18 NOTE — Discharge Instructions (Signed)
I have recommended that you be placed in an assisted care facility or a nursing facility, you have declined at this time, and have requested that we discharge him home. You will be given a prescription for a wheelchair you may return at any time.

## 2014-08-18 NOTE — ED Notes (Signed)
SW to bedside.

## 2014-08-18 NOTE — ED Notes (Addendum)
Left leg pain x 3 weeks, saw dr Juanetta Goslinghawkins on Friday for same, given oxycodone but states it makes her nauseated.  Pt states her leg hurts so much that it "gives out on her"

## 2014-08-18 NOTE — ED Notes (Signed)
Pt states she called her brother for d/c transportation.

## 2014-08-18 NOTE — ED Notes (Addendum)
Per Jessica-SW to order wheelchair to be delivered to pt home in 2-3 days.

## 2014-08-18 NOTE — ED Notes (Signed)
Pt placed in waiting room. Triage and NF notified.

## 2014-08-18 NOTE — Care Management ED Note (Signed)
      CARE MANAGEMENT ED NOTE 08/18/2014  Patient:  Tina [name],Tina [name]   Account Number:  [id number]  Date Initiated:  08/18/2014  Documentation initiated by:  CHILDRESS,JESSICA  Subjective/Objective Assessment:   Pt is from home. Pt very agitated and requests SNF placement. Discussed with pt the CM role and reminded her the SW had already discussed SNF placment with her and [name].     Subjective/Objective Assessment Detail:   D[facility]ed with Pt [facility] vs [facility] agencies. [facility] and Private duty agencies left with pt. Pt says she cant make these decisions right now, "I can't be sick and make my decisions!!". Pt visibly distressed. Pt said [name] wheelchair would help her the most right now and she will follow up with Dr. Juanetta GoslingHawkins if she decides she wants HH. Per pt's choice, [facility], notified of Wheelchair order. [facility] chart printed [facility]. Wheelchair will be delivered to pt's home. (Pt chooses delivery after given [facility] [place] [facility].)  Pt is aware and agreeable to delivery. No further CM needs identified.     Action/Plan:   Action/Plan Detail:   Anticipated DC Date:  08/18/2014     Status Recommendation to Physician:   Result of Recommendation:      DC Planning Services  CM consult   PAC Choice  DURABLE MEDICAL EQUIPMENT   Choice offered to / List presented to:  C-1 Patient  DME arranged  WHEELCHAIR - MANUAL     DME agency  [facility]        Status of service:  Completed, signed off  ED Comments:   ED Comments Detail:

## 2014-08-18 NOTE — Clinical Social Work Psychosocial (Signed)
Clinical Social Work Department BRIEF PSYCHOSOCIAL ASSESSMENT 08/18/2014  Patient:  Tina [name], Tina [name]     Account Number:  [id number]     Admit date:  08/18/2014  Clinical Social Worker:  Wyatt Haste  Date/Time:  08/18/2014 09:59 AM  Referred by:  Physician  Date Referred:  08/18/2014 Referred for  SNF Placement   Other Referral:   Interview type:  Patient Other interview type:    PSYCHOSOCIAL DATA Living Status:  ALONE Admitted from facility:   Level of care:   Primary support name:  Rollin Primary support relationship to patient:  SIBLING Degree of support available:   supportive    CURRENT CONCERNS Current Concerns  Post-Acute Placement   Other Concerns:    SOCIAL WORK ASSESSMENT / PLAN CSW met with pt in ED. Pt alert and oriented and reports she lives alone. She has a brother and a daughter who live locally and assist some as needed. Pt said last night she was up all night trying to get to the bathroom, but was unable. She did not want to call family in the middle of the night.  Pt has also had 2 falls in the last 10 days. Pt had back surgery a year ago and feels she has been "going downhill" since then, especially in the past two weeks. She spent 5 weeks at [facility] before returning home. At baseline, pt ambulates using a walker and is fairly independent. Pt is interested in placement as she is not sure she will be able to manage at home. CSW discussed placement process and provided SNF list. Pt asked about ALF, but agreed that right now she may need more assist. Pt is aware that she would have to pay privately for both levels of care as she does not have a qualifying 3 day stay. She states she has a long term care insurance policy, but is not completely sure of benefits. Pt has over $4,000 month income. She is concerned about paying her mortgage, lawn care, and other monthly expenses as well. Pt decided to return home with home health and requests a hospital bed as she is  unable to get up stairs right now. CM notified and will provide private duty care list as well.   Assessment/plan status:  Referral to [employer] Other assessment/ plan:   Information/referral to community resources:   SNF list  CM for home health, hospital bed    PATIENT'S/FAMILY'S RESPONSE TO PLAN OF CARE: Pt is declining placement right now as she feels she needs to figure out where she would like to go and discuss financial details with a facility regarding her long term care insuracy policy. CSW updated EDP.       Benay Pike, Bruce

## 2014-08-18 NOTE — ED Notes (Signed)
Pt seen by Dr Juanetta Goslinghawkins on Friday for pain & weakness in her legs, was given percocet, took one last night & made her sick. States Dr Juanetta GoslingHawkins making contact for physical therapy.

## 2014-08-18 NOTE — ED Provider Notes (Signed)
CSN: [id number]     Arrival date & time 08/18/14  0620 History  This chart was scribed for [name], MD by Ronney LionSuzanne Le, ED Scribe. This patient was seen in room APA04/APA04 and the patient's care was started at 7:14 AM.    Chief Complaint  Patient presents with  . Leg Pain   The history is provided by the patient. No language interpreter was used.    HPI Comments: Tina [name] is a 81 y.o. female brought in by ambulance, who presents to the Emergency Department complaining of being unable to walk since 3 days ago, after she had started taking Oxytocin prescribed by Dr. Juanetta GoslingHawkins  who diagnosed her with Peripheral Artery Disease; she reports she also vomited the medication once yesterday 8 hours after taking it. She states "it feels like there's an electric shock going down my knees." She is able to get up to a sitting position, but has not been able to stand, as her legs buckle under her and she falls down. She states that before this weekend, she had leg pain, left greater than right, but had been able to stand by pushing herself up on tables or chairs; she is no longer able to do that. She was also able to walk with her walker and drive because she doesn't use her left leg much when driving. Patient was unable to get off the couch today and called an ambulance. She rates her pain while in the room as 3/10 when sitting and much worse when standing.  Patient also reports she had back surgery for lower spine stenosis [facility] by Dr. Jeral FruitBotero in January 2015. She spent 5 weeks at [facility] and reports her back pain never improved, even after having 3 cortisone injections. Patient did not have leg weakness at that point.   [place] She reports frequent urination due to DM, and is able to completely empty her bladder. She takes Duetact for NIDDM. She denies a history of cancer or IV drug use. She denies bladder incontinence, fever, diarrhea, hematochezia,  coughing, or SOB. She denies having a pacemaker.    Past Medical History  Diagnosis Date  . Diabetes mellitus   . Hypertension   . Osteopenia   . Peripheral vascular disease, unspecified   . GERD (gastroesophageal reflux disease)   . Arthritis   . Shortness of breath     exersion  . Pneumonia     child  . Anemia     hx  . Hepatitis     "a"  . Fall at home October 09, 2013   Past Surgical History  Procedure Laterality Date  . Appendectomy  1994  . Eye surgery Bilateral 08  . Lumbar laminectomy/decompression microdiscectomy N/A 07/09/2013    Procedure: LUMBAR LAMINECTOMY/DECOMPRESSION MICRODISCECTOMY LUMBAR TWO TO LUMBAR FIVE;  Surgeon: Karn CassisErnesto M Botero, MD;  Location: [facility];  Service: Neurosurgery;  Laterality: N/A;   Family History  Problem Relation Age of Onset  . Cancer Mother   . Hypertension Mother   . Heart disease Father     before age 79  . Heart attack Father   . Hyperlipidemia Son    History  Substance Use Topics  . Smoking status: Former Smoker -- 0.30 packs/day for 1 years    Types: Cigarettes    Quit date: 01/06/1961  . Smokeless tobacco: Never Used  . Alcohol Use: 4.4 oz/week    4 Glasses of  wine per week   OB History    No data available     Review of Systems  Constitutional: Negative for fever and chills.  Respiratory: Negative for cough, chest tightness and shortness of breath.   Musculoskeletal: Positive for myalgias and gait problem.  All other systems reviewed and are negative.     Allergies  Review of patient's allergies indicates no known allergies.  Home Medications   Prior to Admission medications   Medication Sig Start Date End Date Taking? Authorizing Provider  amLODipine (NORVASC) 10 MG tablet Take 10 mg by mouth daily.     Yes Historical Provider, MD  aspirin EC 81 MG tablet Take 81 mg by mouth at bedtime.   Yes Historical Provider, MD  colesevelam (WELCHOL) 625 MG tablet Take 1,875 mg by mouth 2 (two) times daily  with a meal.    Yes Historical Provider, MD  ezetimibe (ZETIA) 10 MG tablet Take 10 mg by mouth daily.     Yes Historical Provider, MD  lisinopril (PRINIVIL,ZESTRIL) 10 MG tablet Take 10 mg by mouth daily.     Yes Historical Provider, MD  omeprazole (PRILOSEC) 40 MG capsule Take 40 mg by mouth daily.     Yes Historical Provider, MD  pioglitazone-glimepiride (DUETACT) 30-4 MG per tablet Take 1 tablet by mouth daily with breakfast.     Yes Historical Provider, MD  rosuvastatin (CRESTOR) 20 MG tablet Take 20 mg by mouth daily.     Yes Historical Provider, MD  sitaGLIPtin (JANUVIA) 100 MG tablet Take 100 mg by mouth daily.     Yes Historical Provider, MD   BP 130/54 mmHg  Pulse 81  Temp(Src) 98 F (36.7 C) (Oral)  Resp 18  Ht  (1.6 m)  Wt 230 lb (104.327 kg)  BMI 40.75 kg/m2  SpO2 100% Physical Exam  Constitutional: She appears well-developed and well-nourished. No distress.  HENT:  Head: Normocephalic and atraumatic.  Mouth/Throat: Oropharynx is clear and moist. No oropharyngeal exudate.  Eyes: Conjunctivae and EOM are normal. Pupils are equal, round, and reactive to light. Right eye exhibits no discharge. Left eye exhibits no discharge. No scleral icterus.  Neck: Normal range of motion. Neck supple. No JVD present. No thyromegaly present.  Cardiovascular: Normal rate, regular rhythm, normal heart sounds and intact distal pulses.  Exam reveals no gallop and no friction rub.   No murmur heard. Pulmonary/Chest: Effort normal and breath sounds normal. No respiratory distress. She has no wheezes. She has no rales.  Abdominal: Soft. Bowel sounds are normal. She exhibits no distension and no mass. There is no tenderness.  Musculoskeletal: Normal range of motion. She exhibits tenderness. She exhibits no edema.  Mild tenderness to palpation over the right and mid lower back  Lymphadenopathy:    She has no cervical adenopathy.  Neurological: She is alert. Coordination normal.  The patient  is able to straight leg raise minimally bilaterally, she is able to flex her knees bilaterally against some resistance, normal dorsiflexion and plantar flexion at the ankles bilaterally. Normal sensation to light touch and pinprick bilateral lower extremities. Normal upper extremity strength, cranial nerves III through XII normal.  Skin: Skin is warm and dry. No rash noted. No erythema.  Psychiatric: She has a normal mood and affect. Her behavior is normal.  Nursing note and vitals reviewed.   ED Course  Procedures (including critical care time)  DIAGNOSTIC STUDIES: Oxygen Saturation is 100% on room air, normal by my interpretation.    COORDINATION  OF CARE: 7:24 AM - Discussed treatment plan with pt at bedside which includes MRI back and blood tests, and pt agreed to plan.   Labs Review Labs Reviewed  URINALYSIS, ROUTINE W REFLEX MICROSCOPIC - Abnormal; Notable for the following:    Ketones, ur TRACE (*)    All other components within normal limits  COMPREHENSIVE METABOLIC PANEL - Abnormal; Notable for the following:    Potassium 5.3 (*)    Chloride 118 (*)    BUN 27 (*)    GFR calc non Af Amer 54 (*)    GFR calc Af Amer 63 (*)    Anion gap 4 (*)    All other components within normal limits  CBC - Abnormal; Notable for the following:    WBC 10.7 (*)    RBC 3.49 (*)    Hemoglobin 11.8 (*)    MCV 106.9 (*)    All other components within normal limits  CBG MONITORING, ED    Imaging Review No results found.    MDM   Final diagnoses:  Sciatica, unspecified laterality    MRI reviewed from 6 months ago, the patient does have worsening symptoms that appear to be related to sciatica in her lower extremities. This is now preventing her from being able to walk, she is in a home situation which is unsafe, imaging of her lower back is not indicated at this time given no focal neurologic deficits however the patient given her age and home situation will need to have either  assistance or placement in a nursing facility. Vital signs are unremarkable, labs have been drawn.  The patient's care was discussed with the care manager and social worker, the patient has refused inpatient care and requests being discharged home as she does not want to pay the cost of a nursing facility at this time. She wants to go home and think about it with a resource list and a wheelchair which we will provide. Vital signs unremarkable, labs unremarkable overall, patient very stable for discharge and has normal mental status and ability to make decisions. She is aware that she can return at any time.  MRI from previous visit reviewed, no indication for neurosurgical evaluation at this time.  I personally performed the services described in this documentation, which was scribed in my presence. The recorded information has been reviewed and is accurate.        Vida Roller, MD 08/18/14 [phone]

## 2014-08-19 ENCOUNTER — Emergency Department (HOSPITAL_COMMUNITY): Payer: Medicare Other

## 2014-08-19 ENCOUNTER — Encounter (HOSPITAL_COMMUNITY): Payer: Self-pay | Admitting: *Deleted

## 2014-08-19 ENCOUNTER — Emergency Department (HOSPITAL_COMMUNITY)
Admission: EM | Admit: 2014-08-19 | Discharge: 2014-08-19 | Disposition: A | Discharge status: Home / Self Care | Payer: Medicare Other | Attending: Emergency Medicine | Admitting: Emergency Medicine

## 2014-08-19 DIAGNOSIS — M5432 Sciatica, left side: Secondary | ICD-10-CM | POA: Insufficient documentation

## 2014-08-19 DIAGNOSIS — Z87891 Personal history of nicotine dependence: Secondary | ICD-10-CM | POA: Diagnosis not present

## 2014-08-19 DIAGNOSIS — Z8701 Personal history of pneumonia (recurrent): Secondary | ICD-10-CM | POA: Insufficient documentation

## 2014-08-19 DIAGNOSIS — Z79899 Other long term (current) drug therapy: Secondary | ICD-10-CM | POA: Diagnosis not present

## 2014-08-19 DIAGNOSIS — E119 Type 2 diabetes mellitus without complications: Secondary | ICD-10-CM | POA: Insufficient documentation

## 2014-08-19 DIAGNOSIS — Z87828 Personal history of other (healed) physical injury and trauma: Secondary | ICD-10-CM | POA: Insufficient documentation

## 2014-08-19 DIAGNOSIS — Z7982 Long term (current) use of aspirin: Secondary | ICD-10-CM | POA: Insufficient documentation

## 2014-08-19 DIAGNOSIS — K219 Gastro-esophageal reflux disease without esophagitis: Secondary | ICD-10-CM | POA: Insufficient documentation

## 2014-08-19 DIAGNOSIS — Z791 Long term (current) use of non-steroidal anti-inflammatories (NSAID): Secondary | ICD-10-CM | POA: Insufficient documentation

## 2014-08-19 DIAGNOSIS — G8929 Other chronic pain: Secondary | ICD-10-CM | POA: Insufficient documentation

## 2014-08-19 DIAGNOSIS — Z862 Personal history of diseases of the blood and blood-forming organs and certain disorders involving the immune mechanism: Secondary | ICD-10-CM | POA: Insufficient documentation

## 2014-08-19 DIAGNOSIS — M79604 Pain in right leg: Secondary | ICD-10-CM | POA: Diagnosis not present

## 2014-08-19 DIAGNOSIS — M549 Dorsalgia, unspecified: Secondary | ICD-10-CM

## 2014-08-19 DIAGNOSIS — I10 Essential (primary) hypertension: Secondary | ICD-10-CM | POA: Insufficient documentation

## 2014-08-19 DIAGNOSIS — Z9181 History of falling: Secondary | ICD-10-CM | POA: Diagnosis not present

## 2014-08-19 DIAGNOSIS — M1990 Unspecified osteoarthritis, unspecified site: Secondary | ICD-10-CM | POA: Diagnosis not present

## 2014-08-19 DIAGNOSIS — M79605 Pain in left leg: Secondary | ICD-10-CM | POA: Diagnosis present

## 2014-08-19 LAB — URINALYSIS, ROUTINE W REFLEX MICROSCOPIC
BILIRUBIN URINE: NEGATIVE
Glucose, UA: NEGATIVE mg/dL
Hgb urine dipstick: NEGATIVE
KETONES UR: NEGATIVE mg/dL
Leukocytes, UA: NEGATIVE
Nitrite: NEGATIVE
Protein, ur: NEGATIVE mg/dL
Specific Gravity, Urine: 1.021 (ref 1.005–1.030)
Urobilinogen, UA: 0.2 mg/dL (ref 0.0–1.0)
pH: 5 (ref 5.0–8.0)

## 2014-08-19 LAB — CBC WITH DIFFERENTIAL/PLATELET
Basophils Absolute: 0 10*3/uL (ref 0.0–0.1)
Basophils Relative: 0 % (ref 0–1)
EOS PCT: 1 % (ref 0–5)
Eosinophils Absolute: 0.1 10*3/uL (ref 0.0–0.7)
HCT: 40.3 % (ref 36.0–46.0)
Hemoglobin: 12.7 g/dL (ref 12.0–15.0)
LYMPHS PCT: 21 % (ref 12–46)
Lymphs Abs: 2 10*3/uL (ref 0.7–4.0)
MCH: 33.6 pg (ref 26.0–34.0)
MCHC: 31.5 g/dL (ref 30.0–36.0)
MCV: 106.6 fL — AB (ref 78.0–100.0)
Monocytes Absolute: 0.8 10*3/uL (ref 0.1–1.0)
Monocytes Relative: 8 % (ref 3–12)
Neutro Abs: 7 10*3/uL (ref 1.7–7.7)
Neutrophils Relative %: 70 % (ref 43–77)
PLATELETS: 273 10*3/uL (ref 150–400)
RBC: 3.78 MIL/uL — ABNORMAL LOW (ref 3.87–5.11)
RDW: 14.4 % (ref 11.5–15.5)
WBC: 9.9 10*3/uL (ref 4.0–10.5)

## 2014-08-19 LAB — BASIC METABOLIC PANEL
Anion gap: 10 (ref 5–15)
BUN: 26 mg/dL — ABNORMAL HIGH (ref 6–23)
CO2: 22 mmol/L (ref 19–32)
Calcium: 9.8 mg/dL (ref 8.4–10.5)
Chloride: 113 mmol/L — ABNORMAL HIGH (ref 96–112)
Creatinine, Ser: 0.99 mg/dL (ref 0.50–1.10)
GFR calc non Af Amer: 52 mL/min — ABNORMAL LOW (ref >90)
GFR, EST AFRICAN AMERICAN: 60 mL/min — AB (ref >90)
Glucose, Bld: 114 mg/dL — ABNORMAL HIGH (ref 70–99)
POTASSIUM: 5.3 mmol/L — AB (ref 3.5–5.1)
Sodium: 145 mmol/L (ref 135–145)

## 2014-08-19 MED ORDER — KETOROLAC TROMETHAMINE 60 MG/2ML IM SOLN
30.0000 mg | Freq: Once | INTRAMUSCULAR | Status: AC
  Administered 2014-08-19: 30 mg via INTRAMUSCULAR
  Filled 2014-08-19: qty 2

## 2014-08-19 MED ORDER — DIAZEPAM 5 MG PO TABS
5.0000 mg | ORAL_TABLET | Freq: Once | ORAL | Status: AC
  Administered 2014-08-19: 5 mg via ORAL
  Filled 2014-08-19: qty 1

## 2014-08-19 MED ORDER — KETOROLAC TROMETHAMINE 30 MG/ML IJ SOLN: 30.0000 mg | Freq: Once | INTRAMUSCULAR | Status: DC

## 2014-08-19 MED ORDER — DIAZEPAM 5 MG PO TABS: 5.0000 mg | ORAL_TABLET | Freq: Four times a day (QID) | ORAL | Status: DC | PRN

## 2014-08-19 NOTE — Progress Notes (Signed)
Patient is set to discharge to [facility] today. Marylene Landngela from [facility] is here to talk with patient & daughter at bedside. Patient & daughter, [name] at bedside aware. Discharge packet given to RN, Troshia. [facility] called for transport.     Lincoln MaxinKelly Carr Shartzer, LCSW [facility] Clinical Social Worker cell #: [phone]

## 2014-08-19 NOTE — Care Management (Signed)
Confirmed with Lincoln MaxinKelly Harrison, CSW that consult was completed and no CM needs were identified. Elson Ulbrich J. Lucretia RoersWood, RN, BSN, [employer] [phone]

## 2014-08-19 NOTE — ED Notes (Signed)
Patient states that she fell 10 days ago and has what feels like electrical currents running down her legs. She went to [facility] on yesterday and was not satisfied with her care because she was not admitted. Patient states she fell against her chair last night and could not get up. She uses a walker and cane at home usually, but was not using her assistive devices none of the times she has fallen.

## 2014-08-19 NOTE — ED Notes (Signed)
Pt and family member appears upset due to time in ED. RN, Tech and MD explained to pt and family member what was going on and how the process went on ED.

## 2014-08-19 NOTE — Discharge Instructions (Signed)
Back Pain, Adult Low back pain is very common. About 1 in 5 people have back pain.The cause of low back pain is rarely dangerous. The pain often gets better over time.About half of people with a sudden onset of back pain feel better in just 2 weeks. About 8 in 10 people feel better by 6 weeks.  CAUSES Some common causes of back pain include:  Strain of the muscles or ligaments supporting the spine.  Wear and tear (degeneration) of the spinal discs.  Arthritis.  Direct injury to the back. DIAGNOSIS Most of the time, the direct cause of low back pain is not known.However, back pain can be treated effectively even when the exact cause of the pain is unknown.Answering your caregiver's questions about your overall health and symptoms is one of the most accurate ways to make sure the cause of your pain is not dangerous. If your caregiver needs more information, he or she may order lab work or imaging tests (X-rays or MRIs).However, even if imaging tests show changes in your back, this usually does not require surgery. HOME CARE INSTRUCTIONS For many people, back pain returns.Since low back pain is rarely dangerous, it is often a condition that people can learn to manageon their own.   Remain active. It is stressful on the back to sit or stand in one place. Do not sit, drive, or stand in one place for more than 30 minutes at a time. Take short walks on level surfaces as soon as pain allows.Try to increase the length of time you walk each day.  Do not stay in bed.Resting more than 1 or 2 days can delay your recovery.  Do not avoid exercise or work.Your body is made to move.It is not dangerous to be active, even though your back may hurt.Your back will likely heal faster if you return to being active before your pain is gone.  Pay attention to your body when you bend and lift. Many people have less discomfortwhen lifting if they bend their knees, keep the load close to their bodies,and  avoid twisting. Often, the most comfortable positions are those that put less stress on your recovering back.  Find a comfortable position to sleep. Use a firm mattress and lie on your side with your knees slightly bent. If you lie on your back, put a pillow under your knees.  Only take over-the-counter or prescription medicines as directed by your caregiver. Over-the-counter medicines to reduce pain and inflammation are often the most helpful.Your caregiver may prescribe muscle relaxant drugs.These medicines help dull your pain so you can more quickly return to your normal activities and healthy exercise.  Put ice on the injured area.  Put ice in a plastic bag.  Place a towel between your skin and the bag.  Leave the ice on for 15-20 minutes, 03-04 times a day for the first 2 to 3 days. After that, ice and heat may be alternated to reduce pain and spasms.  Ask your caregiver about trying back exercises and gentle massage. This may be of some benefit.  Avoid feeling anxious or stressed.Stress increases muscle tension and can worsen back pain.It is important to recognize when you are anxious or stressed and learn ways to manage it.Exercise is a great option. SEEK MEDICAL CARE IF:  You have pain that is not relieved with rest or medicine.  You have pain that does not improve in 1 week.  You have new symptoms.  You are generally not feeling well. SEEK   IMMEDIATE MEDICAL CARE IF:   You have pain that radiates from your back into your legs.  You develop new bowel or bladder control problems.  You have unusual weakness or numbness in your arms or legs.  You develop nausea or vomiting.  You develop abdominal pain.  You feel faint. Document Released: 06/13/2005 Document Revised: 12/13/2011 Document Reviewed: 10/15/2013 ExitCare Patient Information 2015 [employer]. This information is not intended to replace advice given to you by your health care provider. Make sure you  discuss any questions you have with your health care provider.  

## 2014-08-19 NOTE — ED Notes (Addendum)
Patient able to move both legs and tolerate passive range of motion with both extremities. No deformities noted on assessment. Patient just complains of pain with ambulation and lives at home alone.

## 2014-08-19 NOTE — Progress Notes (Signed)
Clinical Social Work  CSW met with patient and family at bedside, introduced self and explained role. CSW informed patient on short term rehab at [facility]. Patient and family seemed agitated and reports of being waiting for MD for too long and ready to DC. CSW informed patient and family of how busy ED could be, and that someone would be in shortly regarding patients medical condition. CSW gave patient list of SNF in [facility]. CSW also told patient and family that since patient has not been admitted to the hospital at least 3 day, the SNF would ask for private pay. Patient states she cannot pay privately at the time. Patient refuses lists and states that she has something set up already with [facility] in [place]. CSW spoke with Rachael Darby admissions coordinator, Levada Dy who states she has spoke with family via phone but will come to [facility] to talk about private pay since patient has not been in the hospital for at least 3 days. CSW will continue to follow for further DC plans.  Glorious Peach BSW Intern

## 2014-08-19 NOTE — ED Notes (Signed)
Bed: [id number] Expected date:  Expected time:  Means of arrival:  Comments: Ems- leg weakness

## 2014-08-19 NOTE — ED Provider Notes (Signed)
CSN: [id number]     Arrival date & time 08/19/14  0911 History   First MD Initiated Contact with Patient 08/19/14 [id number]     Chief Complaint  Patient presents with  . Leg Pain     (Consider location/radiation/quality/duration/timing/severity/associated sxs/prior Treatment) Patient is a 81 y.o. female presenting with general illness.  Illness Location:  Bil le, L>R, back Quality:  Weakness, shocking pain Severity:  Moderate Onset quality:  Gradual Duration:  3 days Timing:  Constant Progression:  Worsening Chronicity:  Chronic Context:  Woresened after fall, seen with unremarkable wu yesterday, offered nursing home placement and refused Relieved by:  Nothing Worsened by:  Nothing Associated symptoms: no abdominal pain, no cough, no diarrhea, no fatigue, no fever, no loss of consciousness, no nausea and no vomiting     Past Medical History  Diagnosis Date  . Diabetes mellitus   . Hypertension   . Osteopenia   . Peripheral vascular disease, unspecified   . GERD (gastroesophageal reflux disease)   . Arthritis   . Shortness of breath     exersion  . Pneumonia     child  . Anemia     hx  . Hepatitis     "a"  . Fall at home October 09, 2013   Past Surgical History  Procedure Laterality Date  . Appendectomy  1994  . Eye surgery Bilateral 08  . Lumbar laminectomy/decompression microdiscectomy N/A 07/09/2013    Procedure: LUMBAR LAMINECTOMY/DECOMPRESSION MICRODISCECTOMY LUMBAR TWO TO LUMBAR FIVE;  Surgeon: Karn Cassis, MD;  Location: [facility];  Service: Neurosurgery;  Laterality: N/A;   Family History  Problem Relation Age of Onset  . Cancer Mother   . Hypertension Mother   . Heart disease Father     before age 28  . Heart attack Father   . Hyperlipidemia Son    History  Substance Use Topics  . Smoking status: Former Smoker -- 0.30 packs/day for 1 years    Types: Cigarettes    Quit date: 01/06/1961  . Smokeless tobacco: Never Used  . Alcohol Use: 4.4  oz/week    4 Glasses of wine per week   OB History    No data available     Review of Systems  Constitutional: Negative for fever and fatigue.  Respiratory: Negative for cough.   Gastrointestinal: Negative for nausea, vomiting, abdominal pain and diarrhea.  Neurological: Negative for loss of consciousness.  All other systems reviewed and are negative.     Allergies  Review of patient's allergies indicates no known allergies.  Home Medications   Prior to Admission medications   Medication Sig Start Date End Date Taking? Authorizing Provider  acetaminophen (TYLENOL) 500 MG tablet Take 1,000 mg by mouth every 6 (six) hours as needed for mild pain or headache.   Yes Historical Provider, MD  amLODipine (NORVASC) 10 MG tablet Take 10 mg by mouth daily.     Yes Historical Provider, MD  aspirin EC 81 MG tablet Take 81 mg by mouth at bedtime.   Yes Historical Provider, MD  colesevelam (WELCHOL) 625 MG tablet Take 1,875 mg by mouth 2 (two) times daily with a meal.    Yes Historical Provider, MD  diclofenac (VOLTAREN) 75 MG EC tablet Take 75 mg by mouth 2 (two) times daily.   Yes Historical Provider, MD  ezetimibe (ZETIA) 10 MG tablet Take 10 mg by mouth daily.     Yes Historical Provider, MD  lanolin ointment Apply 1 application topically at  bedtime.   Yes Historical Provider, MD  lisinopril (PRINIVIL,ZESTRIL) 10 MG tablet Take 10 mg by mouth daily.     Yes Historical Provider, MD  omeprazole (PRILOSEC) 40 MG capsule Take 40 mg by mouth daily.     Yes Historical Provider, MD  phenylephrine (NEO-SYNEPHRINE) 1 % nasal spray Place 1 drop into both nostrils 3 (three) times daily as needed for congestion.   Yes Historical Provider, MD  pioglitazone-glimepiride (DUETACT) 30-4 MG per tablet Take 1 tablet by mouth daily with breakfast.     Yes Historical Provider, MD  rosuvastatin (CRESTOR) 20 MG tablet Take 20 mg by mouth daily.     Yes Historical Provider, MD  sitaGLIPtin (JANUVIA) 100 MG tablet  Take 100 mg by mouth daily.     Yes Historical Provider, MD  diazepam (VALIUM) 5 MG tablet Take 1 tablet (5 mg total) by mouth every 6 (six) hours as needed (spasms). 08/19/14   Mirian Mo, MD   BP 118/43 mmHg  Pulse 82  Temp(Src) 98.4 F (36.9 C) (Oral)  Resp 18  SpO2 100% Physical Exam  Constitutional: She is oriented to person, place, and time. She appears well-developed and well-nourished.  HENT:  Head: Normocephalic and atraumatic.  Right Ear: External ear normal.  Left Ear: External ear normal.  Eyes: Conjunctivae and EOM are normal. Pupils are equal, round, and reactive to light.  Neck: Normal range of motion. Neck supple.  Cardiovascular: Normal rate, regular rhythm, normal heart sounds and intact distal pulses.   Pulmonary/Chest: Effort normal and breath sounds normal.  Abdominal: Soft. Bowel sounds are normal. There is no tenderness.  Musculoskeletal: Normal range of motion.       Cervical back: Normal.       Thoracic back: Normal.       Lumbar back: She exhibits tenderness. She exhibits no bony tenderness.  Straight leg + on L, strength 5/5 bilaterally  Neurological: She is alert and oriented to person, place, and time.  Skin: Skin is warm and dry.  Vitals reviewed.   ED Course  Procedures (including critical care time) Labs Review Labs Reviewed  CBC WITH DIFFERENTIAL/PLATELET - Abnormal; Notable for the following:    RBC 3.78 (*)    MCV 106.6 (*)    All other components within normal limits  BASIC METABOLIC PANEL - Abnormal; Notable for the following:    Potassium 5.3 (*)    Chloride 113 (*)    Glucose, Bld 114 (*)    BUN 26 (*)    GFR calc non Af Amer 52 (*)    GFR calc Af Amer 60 (*)    All other components within normal limits  URINALYSIS, ROUTINE W REFLEX MICROSCOPIC    Imaging Review Dg Lumbar Spine Complete  08/19/2014   CLINICAL DATA:  Mid low back pain with ambulation.  EXAM: LUMBAR SPINE - COMPLETE 4+ VIEW  COMPARISON:  03/04/2014 MRI.   02/20/2014 plain films.  FINDINGS: Minimal S-shaped lumbar spine curvature. Sacroiliac joints are symmetric. Left hip osteoarthritis is moderate to marked. Dense aortic atherosclerosis. Maintenance of vertebral body height. Mild straightening of expected lordosis. Degenerative disc disease is advanced at L4-5, L3-4, and L2-3. Facet arthropathy involves lower lumbar spine.  IMPRESSION: Advanced spondylosis, without acute osseous finding.  Atherosclerosis.  Left hip osteoarthritis.   Electronically Signed   By: Jeronimo Greaves M.D.   On: 08/19/2014 12:06     EKG Interpretation None      MDM   Final diagnoses:  Back pain  Sciatica, left    81 y.o. female with pertinent PMH of back pain sp surgical correction presents with recurrent acute on chronic back pain after fall 2 days ago.  [facility] and refused nursing home placement.  She presents today she is unable to ambulate or take care of herself at home. On arrival vital signs and physical exam as above. Patient has no neurologic deficit. No frank midline tenderness, however patient has close paraspinal tenderness in bilateral lumbar spine. No bladder incontinence or bowel incontinence and no urinary retention.  Workup today unremarkable. Social work and case management involved and patient transferred to skilled nursing facility. Likely radiculopathy, sciatica..    I have reviewed all laboratory and imaging studies if ordered as above  1. Sciatica, left   2. Back pain         Mirian MoMatthew Gentry, MD 08/19/14 [phone]

## 2014-09-07 ENCOUNTER — Inpatient Hospital Stay (HOSPITAL_COMMUNITY)
Admission: EM | Admit: 2014-09-07 | Discharge: 2014-09-22 | DRG: 041 | Disposition: A | Discharge status: Skilled Nursing Facility | Payer: Medicare Other | Attending: Neurosurgery | Admitting: Neurosurgery

## 2014-09-07 ENCOUNTER — Encounter (HOSPITAL_COMMUNITY): Payer: Self-pay | Admitting: Emergency Medicine

## 2014-09-07 DIAGNOSIS — M5416 Radiculopathy, lumbar region: Secondary | ICD-10-CM | POA: Diagnosis present

## 2014-09-07 DIAGNOSIS — F419 Anxiety disorder, unspecified: Secondary | ICD-10-CM | POA: Diagnosis present

## 2014-09-07 DIAGNOSIS — Z7982 Long term (current) use of aspirin: Secondary | ICD-10-CM | POA: Diagnosis not present

## 2014-09-07 DIAGNOSIS — E1165 Type 2 diabetes mellitus with hyperglycemia: Secondary | ICD-10-CM | POA: Diagnosis present

## 2014-09-07 DIAGNOSIS — M1990 Unspecified osteoarthritis, unspecified site: Secondary | ICD-10-CM | POA: Diagnosis present

## 2014-09-07 DIAGNOSIS — N179 Acute kidney failure, unspecified: Secondary | ICD-10-CM

## 2014-09-07 DIAGNOSIS — G9519 Other vascular myelopathies: Secondary | ICD-10-CM | POA: Diagnosis present

## 2014-09-07 DIAGNOSIS — IMO0002 Reserved for concepts with insufficient information to code with codable children: Secondary | ICD-10-CM | POA: Diagnosis present

## 2014-09-07 DIAGNOSIS — W19XXXA Unspecified fall, initial encounter: Secondary | ICD-10-CM | POA: Diagnosis present

## 2014-09-07 DIAGNOSIS — R531 Weakness: Secondary | ICD-10-CM

## 2014-09-07 DIAGNOSIS — M4806 Spinal stenosis, lumbar region: Secondary | ICD-10-CM | POA: Diagnosis present

## 2014-09-07 DIAGNOSIS — E860 Dehydration: Secondary | ICD-10-CM | POA: Diagnosis present

## 2014-09-07 DIAGNOSIS — I739 Peripheral vascular disease, unspecified: Secondary | ICD-10-CM | POA: Diagnosis present

## 2014-09-07 DIAGNOSIS — D72829 Elevated white blood cell count, unspecified: Secondary | ICD-10-CM | POA: Insufficient documentation

## 2014-09-07 DIAGNOSIS — R296 Repeated falls: Secondary | ICD-10-CM | POA: Diagnosis present

## 2014-09-07 DIAGNOSIS — I10 Essential (primary) hypertension: Secondary | ICD-10-CM | POA: Diagnosis present

## 2014-09-07 DIAGNOSIS — Z419 Encounter for procedure for purposes other than remedying health state, unspecified: Secondary | ICD-10-CM

## 2014-09-07 DIAGNOSIS — E785 Hyperlipidemia, unspecified: Secondary | ICD-10-CM | POA: Diagnosis present

## 2014-09-07 DIAGNOSIS — Z791 Long term (current) use of non-steroidal anti-inflammatories (NSAID): Secondary | ICD-10-CM | POA: Diagnosis not present

## 2014-09-07 DIAGNOSIS — M79605 Pain in left leg: Secondary | ICD-10-CM | POA: Diagnosis present

## 2014-09-07 DIAGNOSIS — R627 Adult failure to thrive: Secondary | ICD-10-CM | POA: Diagnosis present

## 2014-09-07 DIAGNOSIS — B159 Hepatitis A without hepatic coma: Secondary | ICD-10-CM | POA: Diagnosis present

## 2014-09-07 DIAGNOSIS — E1159 Type 2 diabetes mellitus with other circulatory complications: Secondary | ICD-10-CM | POA: Diagnosis not present

## 2014-09-07 DIAGNOSIS — Z79899 Other long term (current) drug therapy: Secondary | ICD-10-CM

## 2014-09-07 DIAGNOSIS — M5440 Lumbago with sciatica, unspecified side: Secondary | ICD-10-CM

## 2014-09-07 DIAGNOSIS — K219 Gastro-esophageal reflux disease without esophagitis: Secondary | ICD-10-CM | POA: Diagnosis present

## 2014-09-07 DIAGNOSIS — Z6839 Body mass index (BMI) 39.0-39.9, adult: Secondary | ICD-10-CM

## 2014-09-07 DIAGNOSIS — Y92009 Unspecified place in unspecified non-institutional (private) residence as the place of occurrence of the external cause: Secondary | ICD-10-CM | POA: Diagnosis not present

## 2014-09-07 DIAGNOSIS — M549 Dorsalgia, unspecified: Secondary | ICD-10-CM

## 2014-09-07 DIAGNOSIS — E1151 Type 2 diabetes mellitus with diabetic peripheral angiopathy without gangrene: Secondary | ICD-10-CM | POA: Diagnosis present

## 2014-09-07 DIAGNOSIS — R52 Pain, unspecified: Secondary | ICD-10-CM

## 2014-09-07 DIAGNOSIS — Z8249 Family history of ischemic heart disease and other diseases of the circulatory system: Secondary | ICD-10-CM | POA: Diagnosis not present

## 2014-09-07 DIAGNOSIS — Z87891 Personal history of nicotine dependence: Secondary | ICD-10-CM | POA: Diagnosis not present

## 2014-09-07 DIAGNOSIS — F0390 Unspecified dementia without behavioral disturbance: Secondary | ICD-10-CM | POA: Diagnosis present

## 2014-09-07 DIAGNOSIS — E669 Obesity, unspecified: Secondary | ICD-10-CM | POA: Diagnosis present

## 2014-09-07 DIAGNOSIS — M48062 Spinal stenosis, lumbar region with neurogenic claudication: Secondary | ICD-10-CM | POA: Diagnosis present

## 2014-09-07 HISTORY — DX: Type 2 diabetes mellitus with hyperglycemia: E11.65

## 2014-09-07 HISTORY — DX: Type 2 diabetes mellitus with diabetic peripheral angiopathy without gangrene: E11.51

## 2014-09-07 LAB — BASIC METABOLIC PANEL
ANION GAP: 9 (ref 5–15)
BUN: 29 mg/dL — ABNORMAL HIGH (ref 6–23)
CO2: 22 mmol/L (ref 19–32)
CREATININE: 1.38 mg/dL — AB (ref 0.50–1.10)
Calcium: 9.4 mg/dL (ref 8.4–10.5)
Chloride: 110 mmol/L (ref 96–112)
GFR, EST AFRICAN AMERICAN: 40 mL/min — AB (ref >90)
GFR, EST NON AFRICAN AMERICAN: 35 mL/min — AB (ref >90)
Glucose, Bld: 55 mg/dL — ABNORMAL LOW (ref 70–99)
Potassium: 4.3 mmol/L (ref 3.5–5.1)
Sodium: 141 mmol/L (ref 135–145)

## 2014-09-07 LAB — CBC WITH DIFFERENTIAL/PLATELET
BASOS ABS: 0.1 10*3/uL (ref 0.0–0.1)
Basophils Relative: 1 % (ref 0–1)
Eosinophils Absolute: 0.2 10*3/uL (ref 0.0–0.7)
Eosinophils Relative: 2 % (ref 0–5)
HCT: 37.5 % (ref 36.0–46.0)
HEMOGLOBIN: 12 g/dL (ref 12.0–15.0)
LYMPHS PCT: 26 % (ref 12–46)
Lymphs Abs: 2.7 10*3/uL (ref 0.7–4.0)
MCH: 33.3 pg (ref 26.0–34.0)
MCHC: 32 g/dL (ref 30.0–36.0)
MCV: 104.2 fL — ABNORMAL HIGH (ref 78.0–100.0)
MONOS PCT: 7 % (ref 3–12)
Monocytes Absolute: 0.8 10*3/uL (ref 0.1–1.0)
Neutro Abs: 6.5 10*3/uL (ref 1.7–7.7)
Neutrophils Relative %: 64 % (ref 43–77)
Platelets: 386 10*3/uL (ref 150–400)
RBC: 3.6 MIL/uL — ABNORMAL LOW (ref 3.87–5.11)
RDW: 13.7 % (ref 11.5–15.5)
WBC: 10.2 10*3/uL (ref 4.0–10.5)

## 2014-09-07 LAB — CBG MONITORING, ED: GLUCOSE-CAPILLARY: 89 mg/dL (ref 70–99)

## 2014-09-07 LAB — TSH: TSH: 1.348 u[IU]/mL (ref 0.350–4.500)

## 2014-09-07 MED ORDER — HEPARIN SODIUM (PORCINE) 5000 UNIT/ML IJ SOLN
5000.0000 [IU] | Freq: Three times a day (TID) | INTRAMUSCULAR | Status: DC
  Administered 2014-09-07 – 2014-09-09 (×6): 5000 [IU] via SUBCUTANEOUS
  Filled 2014-09-07 (×5): qty 1

## 2014-09-07 MED ORDER — HYDROCODONE-ACETAMINOPHEN 5-325 MG PO TABS
2.0000 | ORAL_TABLET | Freq: Once | ORAL | Status: AC
  Administered 2014-09-07: 2 via ORAL
  Filled 2014-09-07: qty 2

## 2014-09-07 MED ORDER — ACETAMINOPHEN 500 MG PO TABS
1000.0000 mg | ORAL_TABLET | Freq: Four times a day (QID) | ORAL | Status: DC | PRN
  Administered 2014-09-08 – 2014-09-09 (×3): 1000 mg via ORAL
  Filled 2014-09-07 (×4): qty 2

## 2014-09-07 MED ORDER — AMLODIPINE BESYLATE 10 MG PO TABS
10.0000 mg | ORAL_TABLET | Freq: Every day | ORAL | Status: DC
  Administered 2014-09-08 – 2014-09-22 (×14): 10 mg via ORAL
  Filled 2014-09-07: qty 1
  Filled 2014-09-07: qty 2
  Filled 2014-09-07: qty 1
  Filled 2014-09-07: qty 2
  Filled 2014-09-07: qty 1
  Filled 2014-09-07: qty 2
  Filled 2014-09-07 (×4): qty 1
  Filled 2014-09-07: qty 2
  Filled 2014-09-07 (×3): qty 1

## 2014-09-07 MED ORDER — ONDANSETRON HCL 4 MG/2ML IJ SOLN
4.0000 mg | Freq: Four times a day (QID) | INTRAMUSCULAR | Status: DC | PRN
  Filled 2014-09-07: qty 2

## 2014-09-07 MED ORDER — ASPIRIN EC 81 MG PO TBEC
81.0000 mg | DELAYED_RELEASE_TABLET | Freq: Every day | ORAL | Status: DC
  Administered 2014-09-07 – 2014-09-21 (×15): 81 mg via ORAL
  Filled 2014-09-07 (×15): qty 1

## 2014-09-07 MED ORDER — ONDANSETRON HCL 4 MG PO TABS
4.0000 mg | ORAL_TABLET | Freq: Four times a day (QID) | ORAL | Status: DC | PRN
  Administered 2014-09-10: 4 mg via ORAL
  Filled 2014-09-07: qty 1

## 2014-09-07 MED ORDER — INSULIN ASPART 100 UNIT/ML ~~LOC~~ SOLN: 0.0000 [IU] | Freq: Every day | SUBCUTANEOUS | Status: DC

## 2014-09-07 MED ORDER — EZETIMIBE 10 MG PO TABS
10.0000 mg | ORAL_TABLET | Freq: Every day | ORAL | Status: DC
  Administered 2014-09-08 – 2014-09-22 (×14): 10 mg via ORAL
  Filled 2014-09-07 (×14): qty 1

## 2014-09-07 MED ORDER — SODIUM CHLORIDE 0.9 % IV SOLN
INTRAVENOUS | Status: DC
  Administered 2014-09-07 – 2014-09-11 (×4): via INTRAVENOUS

## 2014-09-07 MED ORDER — COLESEVELAM HCL 625 MG PO TABS
1875.0000 mg | ORAL_TABLET | Freq: Two times a day (BID) | ORAL | Status: DC
  Administered 2014-09-08 – 2014-09-22 (×27): 1875 mg via ORAL
  Filled 2014-09-07 (×33): qty 3

## 2014-09-07 MED ORDER — INSULIN ASPART 100 UNIT/ML ~~LOC~~ SOLN: 0.0000 [IU] | Freq: Three times a day (TID) | SUBCUTANEOUS | Status: DC

## 2014-09-07 MED ORDER — PHENYLEPHRINE HCL 1 % NA SOLN
1.0000 [drp] | Freq: Three times a day (TID) | NASAL | Status: DC | PRN
  Administered 2014-09-08: 1 [drp] via NASAL
  Filled 2014-09-07: qty 15

## 2014-09-07 MED ORDER — ROSUVASTATIN CALCIUM 20 MG PO TABS
20.0000 mg | ORAL_TABLET | Freq: Every day | ORAL | Status: DC
  Administered 2014-09-08 – 2014-09-22 (×14): 20 mg via ORAL
  Filled 2014-09-07 (×15): qty 1

## 2014-09-07 MED ORDER — ALUM & MAG HYDROXIDE-SIMETH 200-200-20 MG/5ML PO SUSP: 30.0000 mL | Freq: Four times a day (QID) | ORAL | Status: DC | PRN

## 2014-09-07 MED ORDER — SODIUM CHLORIDE 0.9 % IV BOLUS (SEPSIS)
500.0000 mL | Freq: Once | INTRAVENOUS | Status: AC
  Administered 2014-09-07: 500 mL via INTRAVENOUS

## 2014-09-07 NOTE — ED Notes (Signed)
Pt c/o L upper leg pain x 2 weeks. Pt states she has had 4 falls in the last 2 weeks. Pt reports she was here and seen but "they didn't do anything but give me pain pills."  Pt states she is "very upset and should be admitted so they can figure out what's wrong."

## 2014-09-07 NOTE — ED Provider Notes (Signed)
CSN: [id number]     Arrival date & time 09/07/14  1556 History  This chart was scribed for Blane Ohara, MD by Abel Presto, ED Scribe. This patient was seen in room APA01/APA01 and the patient's care was started at 5:17 PM.     Chief Complaint  Patient presents with  . Leg Pain     Patient is a 81 y.o. female presenting with leg pain. The history is provided by the patient and a relative. No language interpreter was used.  Leg Pain Associated symptoms: back pain    HPI Comments: Tina [name] is a 81 y.o. female with PMHx of DM, HTN, peripheral vascular disease, who presents to the Emergency Department complaining of worsening left leg pain with onset 3 weeks ago. Pt describes pain as if she's "being poked with an electric probe" on back of left knee. Pt notes associated weakness in left leg. Pt notes she is falling constantly due to pain, noting her leg gives out when she attempts to move.  Family member notes pt is unable to ambulate.  Pt has taken percocet with no relief. Pt with h/o back surgery for lower spine stenosis in January of 2015. She notes she has had an increased frequency of falls, at least once per day in the last week. She states she has been seen several times for same symptoms. Pt was last seen in ED on 02/231/16 and treated for sciatica. Pt lives at home and will start home health care tomorrow. Pt denies back pain, numbeness and any other medical complaints currently.   Past Medical History  Diagnosis Date  . Diabetes mellitus   . Hypertension   . Osteopenia   . Peripheral vascular disease, unspecified   . GERD (gastroesophageal reflux disease)   . Arthritis   . Shortness of breath     exersion  . Pneumonia     child  . Anemia     hx  . Hepatitis     "a"  . Fall at home October 09, 2013   Past Surgical History  Procedure Laterality Date  . Appendectomy  1994  . Eye surgery Bilateral 08  . Lumbar laminectomy/decompression microdiscectomy N/A 07/09/2013     Procedure: LUMBAR LAMINECTOMY/DECOMPRESSION MICRODISCECTOMY LUMBAR TWO TO LUMBAR FIVE;  Surgeon: Karn Cassis, MD;  Location: [facility];  Service: Neurosurgery;  Laterality: N/A;   Family History  Problem Relation Age of Onset  . Cancer Mother   . Hypertension Mother   . Heart disease Father     before age 77  . Heart attack Father   . Hyperlipidemia Son    History  Substance Use Topics  . Smoking status: Former Smoker -- 0.30 packs/day for 1 years    Types: Cigarettes    Quit date: 01/06/1961  . Smokeless tobacco: Never Used  . Alcohol Use: 4.4 oz/week    4 Glasses of wine per week   OB History    Gravida Para Term Preterm AB TAB SAB Ectopic Multiple Living   Review of Systems  Constitutional: Positive for appetite change.  Musculoskeletal: Positive for myalgias, back pain and arthralgias.  Neurological: Positive for weakness. Negative for numbness.  All other systems reviewed and are negative.     Allergies  Review of patient's allergies indicates no known allergies.  Home Medications   Prior to Admission medications   Medication Sig Start Date End Date Taking?  Authorizing Provider  acetaminophen (TYLENOL) 500 MG tablet Take 1,000 mg by mouth every 6 (six) hours as needed for mild pain or headache.   Yes Historical Provider, MD  amLODipine (NORVASC) 10 MG tablet Take 10 mg by mouth daily.     Yes Historical Provider, MD  aspirin EC 81 MG tablet Take 81 mg by mouth at bedtime.   Yes Historical Provider, MD  colesevelam (WELCHOL) 625 MG tablet Take 1,875 mg by mouth 2 (two) times daily with a meal.    Yes Historical Provider, MD  diclofenac (VOLTAREN) 75 MG EC tablet Take 75 mg by mouth 2 (two) times daily.   Yes Historical Provider, MD  ezetimibe (ZETIA) 10 MG tablet Take 10 mg by mouth daily.     Yes Historical Provider, MD  lanolin ointment Apply 1 application topically at bedtime.   Yes Historical Provider, MD  lisinopril  (PRINIVIL,ZESTRIL) 10 MG tablet Take 10 mg by mouth daily.     Yes Historical Provider, MD  omeprazole (PRILOSEC) 40 MG capsule Take 40 mg by mouth daily.     Yes Historical Provider, MD  phenylephrine (NEO-SYNEPHRINE) 1 % nasal spray Place 1 drop into both nostrils 3 (three) times daily as needed for congestion.   Yes Historical Provider, MD  pioglitazone-glimepiride (DUETACT) 30-4 MG per tablet Take 1 tablet by mouth daily with breakfast.     Yes Historical Provider, MD  rosuvastatin (CRESTOR) 20 MG tablet Take 20 mg by mouth daily.     Yes Historical Provider, MD  sitaGLIPtin (JANUVIA) 100 MG tablet Take 100 mg by mouth daily.     Yes Historical Provider, MD  diazepam (VALIUM) 5 MG tablet Take 1 tablet (5 mg total) by mouth every 6 (six) hours as needed (spasms). Patient not taking: Reported on 09/07/2014 08/19/14   Mirian Mo, MD   BP 126/50 mmHg  Pulse 77  Temp(Src) 97.8 F (36.6 C) (Oral)  Resp 18  Ht  (1.6 m)  Wt 230 lb (104.327 kg)  BMI 40.75 kg/m2  SpO2 100% Physical Exam  Constitutional: She is oriented to person, place, and time. She appears well-developed and well-nourished.  HENT:  Head: Normocephalic.  Dry mm  Eyes: Conjunctivae are normal.  Neck: Normal range of motion. Neck supple.  Cardiovascular: Normal rate, regular rhythm and normal heart sounds.   Pulmonary/Chest: Effort normal and breath sounds normal.  Musculoskeletal: Normal range of motion.  Tenderness on the left paraspinal lumbar region  Neurological: She is alert and oriented to person, place, and time. GCS eye subscore is 4. GCS verbal subscore is 5. GCS motor subscore is 6.  Finger nose intact, no arm drift, equal strength in the arms. Pt able to bend and extend with equal but decreased strength in bilateral lower extremities; equal sensation to extremities to palpation; equal pulses in the lower extremities; decreased reflexes in both lower extremities deconditioned  Skin: Skin is warm and dry.   Psychiatric: She has a normal mood and affect. Her behavior is normal.  Nursing note and vitals reviewed.   ED Course  Procedures (including critical care time) DIAGNOSTIC STUDIES: Oxygen Saturation is 99% on room air, normal by my interpretation.    COORDINATION OF CARE: 5:29 PM Discussed treatment plan with patient at beside, the patient agrees with the plan and has no further questions at this time.   Labs Review Labs Reviewed  CBC WITH DIFFERENTIAL/PLATELET - Abnormal; Notable for the following:    RBC 3.60 (*)  MCV 104.2 (*)    All other components within normal limits  BASIC METABOLIC PANEL - Abnormal; Notable for the following:    Glucose, Bld 55 (*)    BUN 29 (*)    Creatinine, Ser 1.38 (*)    GFR calc non Af Amer 35 (*)    GFR calc Af Amer 40 (*)    All other components within normal limits  TSH    Imaging Review No results found.   EKG Interpretation None      MDM   Final diagnoses:  FTT (failure to thrive) in adult  Generalized weakness  Dehydration  Acute renal failure, unspecified acute renal failure type   I personally performed the services described in this documentation, which was scribed in my presence. The recorded information has been reviewed and is accurate. Patient presents with recurrent lower back pain and radiation down left leg, has been followed for hospice sciatica recently. Recent falls the past 2 weeks her left leg just gives out on her. Patient feels generally weak and unable to stand and do activities of daily living. Patient has been in rehabilitation and nursing home in the recent past. Patient admits that she cannot take care of herself at home.  Discussed with [facility] hospitalist for admission for deconditioning, failure to thrive, dehydration, acute renal failure.  The patients results and plan were reviewed and discussed.   Any x-rays performed were personally reviewed by myself.   Differential diagnosis were considered with  the presenting HPI.  Medications  HYDROcodone-acetaminophen (NORCO/VICODIN) 5-325 MG per tablet 2 tablet (2 tablets Oral Given 09/07/14 1756)  sodium chloride 0.9 % bolus 500 mL (0 mLs Intravenous Stopped 09/07/14 1826)    Filed Vitals:   09/07/14 1619 09/07/14 1800 09/07/14 1824  BP: 112/47 126/50 126/50  Pulse: 83 71 77  Temp: 97.8 F (36.6 C)  97.8 F (36.6 C)  TempSrc: Oral  Oral  Resp: 18  18  Height: 5\' 3"  (1.6 m)    Weight: 230 lb (104.327 kg)    SpO2: 99% 99% 100%    Final diagnoses:  FTT (failure to thrive) in adult  Generalized weakness  Dehydration  Acute renal failure, unspecified acute renal failure type    Admission/ observation were discussed with the admitting physician, patient and/or family and they are comfortable with the plan.     Blane OharaJoshua Tanmay Halteman, MD 09/07/14 [phone]

## 2014-09-07 NOTE — ED Notes (Signed)
Pt assessed at this time.  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  States that she needs a diagnosis of why her leg is hurting.  States that she fell 4 weeks ago and since then has been unable to walk or bear weight on left leg.  Hurting from hip to knee with some radiation down to foot when pressure applied.  Pt very rude and demanding at this time.  States "there was nothing done for me last time I was here and something better be done today".  Reassured pt that we would do our best to take care of her and find out something today.  Call light given as well as warm blanket.  Pt also very upset about blood pressure being taken and started screaming when machine took it.  Timer set for every hour due to pts discomfort.

## 2014-09-07 NOTE — H&P (Signed)
[facility] History and Physical  CELIE DESROCHERS ZOX:[id number] DOB: March 03, 1931 DOA: 09/07/2014  Referring physician:  PCP: Fredirick Maudlin, MD   Chief Complaint: Generalized weakness  HPI: Tina [name] is a 81 y.o. female  with a past medical history of lumbar stenosis with neurogenic claudication, admitted to the neurosurgery service and in January 2015, admitted for severe back pain associate with urinary incontinence, undergoing bilateral 2,3,4,5 laminectomy and foraminotomies decompression of the thecal sac and foraminotomies L1-L2 and L5-S1. She presents to the emergency department with complaints of worsening back pain associated with left lower extremity pain and weakness. She reports having at least 4 falls in her home over the past week as her lower extremity weakness has progressed. Patient expresses difficulties caring for herself as extremity weakness and neurological deficits have greatly affected her ability to perform activities of daily living.  Initial lab work performed in the emergency room did not show evidence of infection as UA was negative and she presented afebrile. Lumbar x-ray was unremarkable.                                                                                                                                                                                                                                                   Review of Systems:  Constitutional:  No weight loss, night sweats, Fevers, chills, fatigue.  HEENT:  No headaches, Difficulty swallowing,Tooth/dental problems,Sore throat,  No sneezing, itching, ear ache, nasal congestion, post nasal drip,  Cardio-vascular:  No chest pain, Orthopnea, PND, swelling in lower extremities, anasarca, dizziness, palpitations  GI:  No heartburn, indigestion, abdominal pain, nausea, vomiting, diarrhea, change in bowel habits, loss of appetite  Resp:  No shortness of breath with exertion or at rest.  No excess mucus, no productive cough, No non-productive cough, No coughing up of blood.No change in color of mucus.No wheezing.No chest wall deformity  Skin:  no rash or lesions.  GU:  no dysuria, change in color of urine, no urgency or frequency. No flank pain.  Musculoskeletal:  No joint pain or swelling. No decreased range of motion. Positive for back pain and left lower extremity pain.  Psych:  No change in mood or affect. No depression or anxiety. No memory loss.   Past Medical History  Diagnosis Date  . Diabetes mellitus   . Hypertension   .  Osteopenia   . Peripheral vascular disease, unspecified   . GERD (gastroesophageal reflux disease)   . Arthritis   . Shortness of breath     exersion  . Pneumonia     child  . Anemia     hx  . Hepatitis     "a"  . Fall at home October 09, 2013   Past Surgical History  Procedure Laterality Date  . Appendectomy  1994  . Eye surgery Bilateral 08  . Lumbar laminectomy/decompression microdiscectomy N/A 07/09/2013    Procedure: LUMBAR LAMINECTOMY/DECOMPRESSION MICRODISCECTOMY LUMBAR TWO TO LUMBAR FIVE;  Surgeon: Karn CassisErnesto M Botero, MD;  Location: [facility];  Service: Neurosurgery;  Laterality: N/A;   Social History:  reports that she quit smoking about 53 years ago. Her smoking use included Cigarettes. She has a .3 pack-year smoking history. She has never used smokeless tobacco. She reports that she drinks about 4.4 oz of alcohol per week. She reports that she does not use illicit drugs.  No Known Allergies  Family History  Problem Relation Age of Onset  . Cancer Mother   . Hypertension Mother   . Heart disease Father     before age 79  . Heart attack Father   . Hyperlipidemia Son     Prior to Admission medications   Medication Sig Start Date End Date Taking? Authorizing Provider  acetaminophen (TYLENOL) 500 MG tablet Take 1,000 mg by mouth every 6 (six) hours as needed for mild pain or headache.   Yes Historical Provider, MD    amLODipine (NORVASC) 10 MG tablet Take 10 mg by mouth daily.     Yes Historical Provider, MD  aspirin EC 81 MG tablet Take 81 mg by mouth at bedtime.   Yes Historical Provider, MD  colesevelam (WELCHOL) 625 MG tablet Take 1,875 mg by mouth 2 (two) times daily with a meal.    Yes Historical Provider, MD  diclofenac (VOLTAREN) 75 MG EC tablet Take 75 mg by mouth 2 (two) times daily.   Yes Historical Provider, MD  ezetimibe (ZETIA) 10 MG tablet Take 10 mg by mouth daily.     Yes Historical Provider, MD  lanolin ointment Apply 1 application topically at bedtime.   Yes Historical Provider, MD  lisinopril (PRINIVIL,ZESTRIL) 10 MG tablet Take 10 mg by mouth daily.     Yes Historical Provider, MD  omeprazole (PRILOSEC) 40 MG capsule Take 40 mg by mouth daily.     Yes Historical Provider, MD  phenylephrine (NEO-SYNEPHRINE) 1 % nasal spray Place 1 drop into both nostrils 3 (three) times daily as needed for congestion.   Yes Historical Provider, MD  pioglitazone-glimepiride (DUETACT) 30-4 MG per tablet Take 1 tablet by mouth daily with breakfast.     Yes Historical Provider, MD  rosuvastatin (CRESTOR) 20 MG tablet Take 20 mg by mouth daily.     Yes Historical Provider, MD  sitaGLIPtin (JANUVIA) 100 MG tablet Take 100 mg by mouth daily.     Yes Historical Provider, MD  diazepam (VALIUM) 5 MG tablet Take 1 tablet (5 mg total) by mouth every 6 (six) hours as needed (spasms). Patient not taking: Reported on 09/07/2014 08/19/14   Mirian MoMatthew Gentry, MD   Physical Exam: Filed Vitals:   09/07/14 1800 09/07/14 1824 09/07/14 1900 09/07/14 2000  BP: 126/50 126/50  149/63  Pulse: 71 77 90   Temp:  97.8 F (36.6 C)    TempSrc:  Oral    Resp:  18    Height:  Weight:      SpO2: 99% 100% 100%     Wt Readings from Last 3 Encounters:  09/07/14 104.327 kg (230 lb)  08/18/14 104.327 kg (230 lb)  10/11/13 105.235 kg (232 lb)    General:  Anxious appearing in mild distress, awake, alert, mentating well Eyes:  PERRL, normal lids, irises & conjunctiva ENT: grossly normal hearing, lips & tongue Neck: no LAD, masses or thyromegaly Cardiovascular: RRR, no m/r/g. No LE edema. Telemetry: SR, no arrhythmias  Respiratory: CTA bilaterally, no w/r/r. Normal respiratory effort. Abdomen: soft, ntnd Skin: no rash or induration seen on limited exam Musculoskeletal: grossly normal tone BUE/BLE Psychiatric: grossly normal mood and affect, speech fluent and appropriate Neurologic: Patient has 3 out of 5 muscle strength to her left lower extremity, positive straight leg rising, sensation was intact as well as the tendon reflexes. She has 4-5 muscle strength to her right lower extremity, 5 of 5 muscle strength to bilateral upper extremities.           Labs on Admission:  Basic Metabolic Panel:  Recent Labs Lab 09/07/14 1800  NA 141  K 4.3  CL 110  CO2 22  GLUCOSE 55*  BUN 29*  CREATININE 1.38*  CALCIUM 9.4   Liver Function Tests: No results for input(s): AST, ALT, ALKPHOS, BILITOT, PROT, ALBUMIN in the last 168 hours. No results for input(s): LIPASE, AMYLASE in the last 168 hours. No results for input(s): AMMONIA in the last 168 hours. CBC:  Recent Labs Lab 09/07/14 1800  WBC 10.2  NEUTROABS 6.5  HGB 12.0  HCT 37.5  MCV 104.2*  PLT 386   Cardiac Enzymes: No results for input(s): CKTOTAL, CKMB, CKMBINDEX, TROPONINI in the last 168 hours.  BNP (last 3 results) No results for input(s): BNP in the last 8760 hours.  ProBNP (last 3 results) No results for input(s): PROBNP in the last 8760 hours.  CBG:  Recent Labs Lab 09/07/14 2003  GLUCAP 89    Radiological Exams on Admission: No results found.  EKG: Independently reviewed.   Assessment/Plan Principal Problem:   FTT (failure to thrive) in adult Active Problems:   Generalized weakness   AKI (acute kidney injury)   Peripheral arterial disease   Lumbar stenosis with neurogenic claudication   Dehydration   1. Lumbar  stenosis with neurogenic claudication. Patient presenting with complaints of increasing lower back pain and worsening left lower extremity weakness. In January 2015 she underwent  2,3,4,5 laminectomy and foraminotomies decompression of the thecal sac and foraminotomies L1-L2 and L5-S1. Will further workup with MRI of lumbar spine. Physical therapy consultation. 2. Failure to thrive. Patient's reporting difficulties performing activities of daily living having recurrent falls, unable to cocaine clean for herself. I think it's possible that her history of lumbar stenosis has led to her becoming increasingly sedentary, resulting in deconditioning. It appears she has had difficulties with cooking for herself, as she might be dehydrated as well. Consult physical therapy, may need rehabilitation at skilled nursing facility. 3. Acute kidney injury. She presents with creatinine 1.38 with BUN of 29, having creatinine 0.99 and BUN of 26 on 08/22/2014. I suspect this may be related to prerenal azotemia. Will provide IV fluid resuscitation overnight, repeat labs in a.m. 4. Hypertension. Because of upward trend in her creatinine will stop ACE inhibitor therapy, continue amlodipine 10 mg by mouth daily. 5. Type 2 diabetes mellitus. Initial labs showed a glucose of 55 which may have resulted from decreased by mouth intake at home along  with decreased clearance from kidneys. I will stop oral hypoglycemics and place her on sliding scale coverage and monitor blood sugars over the next 24 hours.  6. Deconditioning. Physical therapy consultation 7. DVT prophylaxis. Subcutaneous heparin   Code Status: Full code Family Communication:  Disposition Plan: Will admit patient to the inpatient service, to swish and may require greater than 2 nights hospitalization  Time spent: 65 min  Jeralyn Bennett [facility] Pager [phone]

## 2014-09-07 NOTE — ED Notes (Signed)
Pt has eaten 1/2 of a sandwich within this last hour, given orange juice now and will recheck CBG. Fully alert, anxious to get to room

## 2014-09-08 ENCOUNTER — Inpatient Hospital Stay (HOSPITAL_COMMUNITY): Payer: Medicare Other

## 2014-09-08 LAB — GLUCOSE, CAPILLARY
GLUCOSE-CAPILLARY: 135 mg/dL — AB (ref 70–99)
Glucose-Capillary: 21 mg/dL — CL (ref 70–99)
Glucose-Capillary: 94 mg/dL (ref 70–99)
Glucose-Capillary: 110 mg/dL — ABNORMAL HIGH (ref 70–99)
Glucose-Capillary: 106 mg/dL — ABNORMAL HIGH (ref 70–99)

## 2014-09-08 LAB — CBC
HCT: 34.6 % — ABNORMAL LOW (ref 36.0–46.0)
HEMOGLOBIN: 10.8 g/dL — AB (ref 12.0–15.0)
MCH: 32.8 pg (ref 26.0–34.0)
MCHC: 31.2 g/dL (ref 30.0–36.0)
MCV: 105.2 fL — ABNORMAL HIGH (ref 78.0–100.0)
PLATELETS: 370 10*3/uL (ref 150–400)
RBC: 3.29 MIL/uL — ABNORMAL LOW (ref 3.87–5.11)
RDW: 13.9 % (ref 11.5–15.5)
WBC: 8 10*3/uL (ref 4.0–10.5)

## 2014-09-08 LAB — BASIC METABOLIC PANEL
Anion gap: 4 — ABNORMAL LOW (ref 5–15)
BUN: 21 mg/dL (ref 6–23)
CALCIUM: 8.8 mg/dL (ref 8.4–10.5)
CO2: 24 mmol/L (ref 19–32)
CREATININE: 0.92 mg/dL (ref 0.50–1.10)
Chloride: 114 mmol/L — ABNORMAL HIGH (ref 96–112)
GFR calc non Af Amer: 57 mL/min — ABNORMAL LOW (ref >90)
GFR, EST AFRICAN AMERICAN: 66 mL/min — AB (ref >90)
GLUCOSE: 48 mg/dL — AB (ref 70–99)
Potassium: 3.9 mmol/L (ref 3.5–5.1)
SODIUM: 142 mmol/L (ref 135–145)

## 2014-09-08 MED ORDER — HYDROMORPHONE HCL 1 MG/ML IJ SOLN
1.0000 mg | INTRAMUSCULAR | Status: DC | PRN
Start: 2014-09-08 — End: 2014-09-20
  Administered 2014-09-08 – 2014-09-17 (×12): 1 mg via INTRAVENOUS
  Filled 2014-09-08 (×13): qty 1

## 2014-09-08 MED ORDER — ENSURE COMPLETE PO LIQD
237.0000 mL | Freq: Two times a day (BID) | ORAL | Status: DC
  Administered 2014-09-08 – 2014-09-22 (×23): 237 mL via ORAL

## 2014-09-08 NOTE — Progress Notes (Signed)
INITIAL NUTRITION ASSESSMENT  DOCUMENTATION CODES Per approved criteria  -Obesity Unspecified   INTERVENTION: Ensure Enlive po BID, each supplement provides 350 kcal and 20 grams of protein   NUTRITION DIAGNOSIS: Predicted suboptimal intake related to failure to thrive as evidenced by dehydration on admission and diet hx .   Goal: Pt to meet >/= 90% of their estimated nutrition needs    Monitor:  Meals, labs and wt trends   Reason for Assessment: Malnutrition Screen  81 y.o. female  Admitting Dx: FTT (failure to thrive) in adult  ASSESSMENT: Pt says she has "lost interest" in eating and describes her daily intake as boring. She usually has breakfast of coffee oatmeal or waffle with pork link, [place] soup and sandwich for lunch  and frozen meal for dinner with tea or soda to drink. She mostly uses her microwave and says she doesn't cook anymore due to back pain. Her husband died about 7 years ago and she spends most of her days sitting and reading. Her weight has decreased but not significantly. The quality of her usual nutrition intake is poor. Missing fresh fruits, vegetables and whole grains and limited in protein and fiber. High sodium foods consistently consumed.  Nutrition focused physical exam: mild wasting to temporal clavicle regions, skin is dry.   Height: Ht Readings from Last 1 Encounters:  09/07/14  (1.6 m)    Weight: Wt Readings from Last 1 Encounters:  09/07/14 224 lb 1.6 oz (101.651 kg)    Ideal Body Weight: 115#  % Ideal Body Weight: 195%  Wt Readings from Last 10 Encounters:  09/07/14 224 lb 1.6 oz (101.651 kg)  09/08/14 224 lb (101.606 kg)  08/18/14 230 lb (104.327 kg)  10/11/13 232 lb (105.235 kg)  07/09/13 237 lb (107.502 kg)  08/24/12 240 lb (108.863 kg)  02/17/12 247 lb (112.038 kg)  01/28/11 240 lb (108.863 kg)    Usual Body Weight: 230-235#  % Usual Body Weight: 97%  BMI:  Body mass index is 39.71 kg/(m^2). obesity class  II  Estimated Nutritional Needs: Kcal: 1306-1560 Protein: 80-90 gr Fluid: >1500 ml daily   Skin: intact  Diet Order: Diet heart healthy/carb modified  EDUCATION NEEDS: -No education needs identified at this time   Intake/Output Summary (Last 24 hours) at 09/08/14 1512 Last data filed at 09/08/14 1300  Gross per 24 hour  Intake    480 ml  Output    200 ml  Net    280 ml    Last BM: 09/05/14   Labs:   Recent Labs Lab 09/07/14 1800 09/08/14 0624  NA 141 142  K 4.3 3.9  CL 110 114*  CO2 22 24  BUN 29* 21  CREATININE 1.38* 0.92  CALCIUM 9.4 8.8  GLUCOSE 55* 48*    CBG (last 3)   Recent Labs  09/08/14 0748 09/08/14 0821 09/08/14 1151  GLUCAP 21* 94 110*    Scheduled Meds: . amLODipine  10 mg Oral Daily  . aspirin EC  81 mg Oral QHS  . colesevelam  1,875 mg Oral BID WC  . ezetimibe  10 mg Oral Daily  . feeding supplement (ENSURE COMPLETE)  237 mL Oral BID BM  . heparin  5,000 Units Subcutaneous 3 times per day  . rosuvastatin  20 mg Oral Daily    Continuous Infusions: . sodium chloride 75 mL/hr at 09/07/14 2101    Past Medical History  Diagnosis Date  . Diabetes mellitus   . Hypertension   .  Osteopenia   . Peripheral vascular disease, unspecified   . GERD (gastroesophageal reflux disease)   . Arthritis   . Shortness of breath     exersion  . Pneumonia     child  . Anemia     hx  . Hepatitis     "a"  . Fall at home October 09, 2013    Past Surgical History  Procedure Laterality Date  . Appendectomy  1994  . Eye surgery Bilateral 08  . Lumbar laminectomy/decompression microdiscectomy N/A 07/09/2013    Procedure: LUMBAR LAMINECTOMY/DECOMPRESSION MICRODISCECTOMY LUMBAR TWO TO LUMBAR FIVE;  Surgeon: Karn CassisErnesto M Botero, MD;  Location: [facility];  Service: Neurosurgery;  Laterality: N/A;    Royann ShiversLynn Kaelea Gathright MS,RD,CSG,LDN Office: [phone] Pager: [phone]

## 2014-09-08 NOTE — Progress Notes (Signed)
UR chart review completed.  

## 2014-09-08 NOTE — Progress Notes (Signed)
She was admitted with failure to thrive. She has had low blood sugar. She is undergoing MRI scan now to further evaluate her back.

## 2014-09-08 NOTE — Care Management Note (Addendum)
    Page 1 of 1   09/11/2014     2:31:04 PM CARE MANAGEMENT NOTE 09/11/2014  Patient:  Penelope CoopCURRAN,Daphyne A   Account Number:  [id number]  Date Initiated:  09/08/2014  Documentation initiated by:  Sharrie RothmanBLACKWELL,Aloysuis Ribaudo C  Subjective/Objective Assessment:   Pt admitted from home with leg pain, back pain, and failure to thrive. Pt lives alone and has a daughter who helps as when she can. [facility] PT, [facility]. Pt has a w/c, and walker for home use.     Action/Plan:   Awaiting PT eval recommendations. Pt stated that she just might return home with reusmption of HH at discharge. Will continue to follow.   Anticipated DC Date:  09/09/2014   Anticipated DC Plan:  HOME W HOME HEALTH SERVICES      DC Planning Services  CM consult      Choice offered to / List presented to:             Status of service:  Completed, signed off Medicare Important Message given?   (If response is "NO", the following Medicare IM given date fields will be blank) Date Medicare IM given:   Medicare IM given by:   Date Additional Medicare IM given:   Additional Medicare IM given by:    Discharge Disposition:  ACUTE TO ACUTE TRANS  Per UR Regulation:    If discussed at Long Length of Stay Meetings, dates discussed:    Comments:  09/11/14 1430 Arlyss Queenammy Shamicka Inga, RN BSN CM Pt had epidural injection on 09/10/14 and per pt this did not help very much. [facility] with Dr. Jeral FruitBotero. CM on receiving unit to follow for discharge planning needs.  09/08/14 1120 Arlyss Queenammy Aren Pryde, RN BSN CM

## 2014-09-08 NOTE — Progress Notes (Signed)
Patient complaining of intense pain 10/10 despite medicine given 1 hour prior. Dr. Juanetta GoslingHawkins notified. He stated he would add additional pain med. Will give med once order is placed and verified. Patient given ice pack to help with pain.

## 2014-09-08 NOTE — Progress Notes (Signed)
PT Cancellation Note  Patient Details Name: Tina [name] MRN: [id number] DOB: 04/19/1931   Cancelled Treatment:    Reason Eval/Treat Not Completed: Fatigue/lethargy limiting ability to participate.  Pt c/o mild nausea and does not feel up to a PT eval.  She reports that she is not in a rush to leave the hospital.  She reports that she lives alone and usually ambulated with no assistive device.  She has a full flight of steps to her second floor for which she was able to climb with no problem.  Recently her left leg has been giving way and causing falls.  She has not decided if she would like to discharge to home or SNF.   [name], [name] 09/08/2014, 3:15 PM

## 2014-09-09 ENCOUNTER — Encounter (HOSPITAL_COMMUNITY): Payer: Self-pay | Admitting: *Deleted

## 2014-09-09 LAB — GLUCOSE, CAPILLARY
GLUCOSE-CAPILLARY: 142 mg/dL — AB (ref 70–99)
GLUCOSE-CAPILLARY: 121 mg/dL — AB (ref 70–99)
Glucose-Capillary: 119 mg/dL — ABNORMAL HIGH (ref 70–99)
Glucose-Capillary: 117 mg/dL — ABNORMAL HIGH (ref 70–99)

## 2014-09-09 MED ORDER — OXYCODONE HCL 5 MG PO TABS
5.0000 mg | ORAL_TABLET | ORAL | Status: DC | PRN
  Administered 2014-09-09 – 2014-09-12 (×12): 5 mg via ORAL
  Filled 2014-09-09 (×12): qty 1

## 2014-09-09 MED ORDER — OXYCODONE-ACETAMINOPHEN 5-325 MG PO TABS
1.0000 | ORAL_TABLET | ORAL | Status: DC | PRN
  Administered 2014-09-09 – 2014-09-12 (×12): 1 via ORAL
  Filled 2014-09-09 (×12): qty 1

## 2014-09-09 NOTE — Progress Notes (Signed)
Patient told nurse and nurse tech that she wanted to die. 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 Patient stated she will not cooperate with any psych evaluation. Suicide Precautions put into place.

## 2014-09-09 NOTE — Progress Notes (Signed)
Patient had psych consult. [facility] Dr. Juanetta GoslingHawkins gave order to d/c suicide precautions.

## 2014-09-09 NOTE — Progress Notes (Signed)
PT Cancellation Note  Patient Details Name: Tina [name] MRN: [id number] DOB: 10/29/1930   Cancelled Treatment:    Reason Eval/Treat Not Completed: Pain limiting ability to participate;Fatigue/lethargy limiting ability to participate.  Pt states that her LLE has been hurting and she is far too exhausted to try to move.  I reminded her that the longer she stayed in the bed the weaker she would get and she clearly understands this.  She is apparently waiting for some guidance from the Neurosurgeon.   [name], [name] L 09/09/2014, 1:40 PM

## 2014-09-09 NOTE — BH Assessment (Addendum)
Tele Assessment Note   Tina [name] is an 81 y.o. female that was assessed this day via tele assessment per order of Dr. Juanetta Gosling [phone]).  Pt was overheard telling her brother that she wished she would die per Victorino Dike, nurse.  Upon assessment, pt was irritable, but cooperative, answering all questions, stating this assessment was "appalling" because "I would never hurt myself."  Pt stated she said this because of the pain in her leg and not being able to do all of the things that she would like to do like cook often or drive.  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  Pt adamantly denies SI, history of self-harm or SI/attempt, HI, or AVH.  No delusions noted.  Pt denies SA.  Pt stated she has never had mental heath treatment or been on psychotropic medications.  Pt does admit she made the statement about wanting to die, but stated she did not mean it, that she just feels some depressive sx related to this medical condition, including being despondent, having sad mood at times, and irritability.  Pt denies having anxiety.  Pt cooperative, high-functioning, has irritable and depressed mood, good eye contact, is oriented x 4, has logical/coherent thought processes and normal speech.  Consulted with Alberteen Sam, NP at [facility] at [phone], who stated pt doesn't meet inpatient criteria at this time.  Called and informed Dr. Juanetta Gosling and Victorino Dike, pt's nurse at 1151 to notify them of pt disposition and Dr. Juanetta Gosling in agreement.  TTS updated.  Axis I: 294.8 Other specified mental disorder due to another medical condition Axis II: Deferred Axis III:  Past Medical History  Diagnosis Date  . Diabetes mellitus   . Hypertension   . Osteopenia   . Peripheral vascular disease, unspecified   . GERD (gastroesophageal reflux disease)   . Arthritis   . Shortness of breath     exersion  . Pneumonia     child  . Anemia     hx  . Hepatitis     "a"  . Fall at home  October 09, 2013   Axis IV: other psychosocial or environmental problems Axis V: 51-60 moderate symptoms  Past Medical History:  Past Medical History  Diagnosis Date  . Diabetes mellitus   . Hypertension   . Osteopenia   . Peripheral vascular disease, unspecified   . GERD (gastroesophageal reflux disease)   . Arthritis   . Shortness of breath     exersion  . Pneumonia     child  . Anemia     hx  . Hepatitis     "a"  . Fall at home October 09, 2013    Past Surgical History  Procedure Laterality Date  . Appendectomy  1994  . Eye surgery Bilateral 08  . Lumbar laminectomy/decompression microdiscectomy N/A 07/09/2013    Procedure: LUMBAR LAMINECTOMY/DECOMPRESSION MICRODISCECTOMY LUMBAR TWO TO LUMBAR FIVE;  Surgeon: Karn Cassis, MD;  Location: [facility];  Service: Neurosurgery;  Laterality: N/A;    Family History:  Family History  Problem Relation Age of Onset  . Cancer Mother   . Hypertension Mother   . Heart disease Father     before age 52  . Heart attack Father   . Hyperlipidemia Son     Social History:  reports that she quit smoking about 53 years ago. Her smoking use included Cigarettes. She has a .3 pack-year smoking history. She has never used smokeless tobacco. She  reports that she does not drink alcohol or use illicit drugs.  Additional Social History:  Alcohol / Drug Use Pain Medications: see med list Prescriptions: see med list Over the Counter: see med list History of alcohol / drug use?: No history of alcohol / drug abuse Longest period of sobriety (when/how long):  (na) Negative Consequences of Use:  (na) Withdrawal Symptoms:  (na)  CIWA: CIWA-Ar BP: (!) 136/51 mmHg Pulse Rate: 80 COWS:    PATIENT STRENGTHS: (choose at least two) Ability for insight Average or above average intelligence Capable of independent living MetallurgistCommunication skills Financial means General fund of knowledge Supportive family/friends  Allergies: No Known  Allergies  Home Medications:  Medications Prior to Admission  Medication Sig Dispense Refill  . acetaminophen (TYLENOL) 500 MG tablet Take 1,000 mg by mouth every 6 (six) hours as needed for mild pain or headache.    Marland Kitchen. amLODipine (NORVASC) 10 MG tablet Take 10 mg by mouth daily.      Marland Kitchen. aspirin EC 81 MG tablet Take 81 mg by mouth at bedtime.    . colesevelam (WELCHOL) 625 MG tablet Take 1,875 mg by mouth 2 (two) times daily with a meal.     . diclofenac (VOLTAREN) 75 MG EC tablet Take 75 mg by mouth 2 (two) times daily.    Marland Kitchen. ezetimibe (ZETIA) 10 MG tablet Take 10 mg by mouth daily.      Marland Kitchen. lanolin ointment Apply 1 application topically at bedtime.    Marland Kitchen. lisinopril (PRINIVIL,ZESTRIL) 10 MG tablet Take 10 mg by mouth daily.      Marland Kitchen. omeprazole (PRILOSEC) 40 MG capsule Take 40 mg by mouth daily.      . phenylephrine (NEO-SYNEPHRINE) 1 % nasal spray Place 1 drop into both nostrils 3 (three) times daily as needed for congestion.    . pioglitazone-glimepiride (DUETACT) 30-4 MG per tablet Take 1 tablet by mouth daily with breakfast.      . rosuvastatin (CRESTOR) 20 MG tablet Take 20 mg by mouth daily.      . sitaGLIPtin (JANUVIA) 100 MG tablet Take 100 mg by mouth daily.      . diazepam (VALIUM) 5 MG tablet Take 1 tablet (5 mg total) by mouth every 6 (six) hours as needed (spasms). (Patient not taking: Reported on 09/07/2014) 10 tablet 0    OB/GYN Status:  No LMP recorded. Patient is postmenopausal.  General Assessment Data Location of Assessment:  [facility]) Is this a Tele or Face-to-Face Assessment?: Tele Assessment Is this an Initial Assessment or a Re-assessment for this encounter?: Initial Assessment Living Arrangements: Alone Can pt return to current living arrangement?: Yes Admission Status: Voluntary Is patient capable of signing voluntary admission?: Yes Transfer from: Acute Hospital Referral Source: [facility]     [facility] Living Arrangements: Alone Name of  Psychiatrist: none Name of Therapist: none  Education Status Is patient currently in school?: No Highest grade of school patient has completed: Doctoral degree  Risk to self with the past 6 months Suicidal Ideation: No Suicidal Intent: No Is patient at risk for suicide?: No Suicidal Plan?: No Access to Means: No What has been your use of drugs/alcohol within the last 12 months?: na - pt denies Previous Attempts/Gestures: No How many times?: 0 Other Self Harm Risks: na - pt denies Triggers for Past Attempts: None known Intentional Self Injurious Behavior: None Family Suicide History: No Recent stressful life event(s): Other (Comment) (Medical) Persecutory voices/beliefs?: No Depression: Yes Depression Symptoms:  Despondent, Feeling angry/irritable Substance abuse history and/or treatment for substance abuse?: No Suicide prevention information given to non-admitted patients: Not applicable  Risk to Others within the past 6 months Homicidal Ideation: No Thoughts of Harm to Others: No Current Homicidal Intent: No Current Homicidal Plan: No Access to Homicidal Means: No Identified Victim: na - pt denies History of harm to others?: No Assessment of Violence: None Noted Violent Behavior Description: na - pt cooperative Does patient have access to weapons?: No Criminal Charges Pending?: No Does patient have a court date: No  Psychosis Hallucinations: None noted Delusions: None noted  Mental Status Report Appear/Hygiene: In hospital gown Eye Contact: Good Motor Activity: Other (Comment) (reports pain in leg) Speech: Logical/coherent Level of Consciousness: Alert, Irritable Mood: Irritable Affect: Irritable Anxiety Level: None Thought Processes: Coherent, Relevant Judgement: Unimpaired Orientation: Person, Place, Time, Situation Obsessive Compulsive Thoughts/Behaviors: None  Cognitive Functioning Concentration: Normal Memory: Recent Intact, Remote Intact IQ: Above  Average Insight: Good Impulse Control: Fair Appetite: Fair Weight Loss: 20 (over years) Weight Gain: 0 Sleep: No Change Total Hours of Sleep:  (9-10) Vegetative Symptoms: None  ADLScreening [facility] Assessment Services) Patient's cognitive ability adequate to safely complete daily activities?: Yes Patient able to express need for assistance with ADLs?: Yes Independently performs ADLs?: No  Prior Inpatient Therapy Prior Inpatient Therapy: No Prior Therapy Dates: na Prior Therapy Facilty/Provider(s): na Reason for Treatment: na  Prior Outpatient Therapy Prior Outpatient Therapy: No Prior Therapy Dates: na Prior Therapy Facilty/Provider(s): na Reason for Treatment: na  ADL Screening (condition at time of admission) Patient's cognitive ability adequate to safely complete daily activities?: Yes Is the patient deaf or have difficulty hearing?: No Does the patient have difficulty seeing, even when wearing glasses/contacts?: Yes Does the patient have difficulty concentrating, remembering, or making decisions?: No Patient able to express need for assistance with ADLs?: Yes Does the patient have difficulty dressing or bathing?: Yes Independently performs ADLs?: No Communication: Independent Dressing (OT): Needs assistance Is this a change from baseline?: Pre-admission baseline Grooming: Needs assistance Is this a change from baseline?: Pre-admission baseline Feeding: Independent Bathing: Needs assistance Is this a change from baseline?: Pre-admission baseline Toileting: Needs assistance Is this a change from baseline?: Pre-admission baseline In/Out Bed: Needs assistance Is this a change from baseline?: Pre-admission baseline Walks in Home: Needs assistance Is this a change from baseline?: Pre-admission baseline Does the patient have difficulty walking or climbing stairs?: Yes Weakness of Legs: Both Weakness of Arms/Hands: Both  Home Assistive Devices/Equipment Home Assistive  Devices/Equipment: Wheelchair, Environmental consultant (specify type), Eyeglasses, Bedside commode/3-in-1  Therapy Consults (therapy consults require a physician order) PT Evaluation Needed: Yes (Comment) OT Evalulation Needed: No SLP Evaluation Needed: No Abuse/Neglect Assessment (Assessment to be complete while patient is alone) Physical Abuse: Denies Verbal Abuse: Denies Sexual Abuse: Denies Exploitation of patient/patient's resources: Denies Self-Neglect: Denies Values / Beliefs Cultural Requests During Hospitalization: None Spiritual Requests During Hospitalization: None Consults Spiritual Care Consult Needed: No Social Work Consult Needed: No Merchant navy officer (For Healthcare) Does patient have an advance directive?: No Would patient like information on creating an advanced directive?: No - patient declined information Copy of advanced directive(s) in chart?: No - copy requested Nutrition Screen- [facility] Patient's home diet: Regular Has the patient recently lost weight without trying?: Yes, 2-13 lbs. Has the patient been eating poorly because of a decreased appetite?: Yes Malnutrition Screening Tool Score: 2  Additional Information 1:1 In Past 12 Months?: No CIRT Risk: No Elopement Risk: No Does patient have  medical clearance?: Yes     Disposition:  Disposition Initial Assessment Completed for this Encounter: Yes Disposition of Patient: Other dispositions Other disposition(s): To current provider  Casimer Lanius, MS, [facility] Therapeutic Triage Specialist [facility]   09/09/2014 12:04 PM

## 2014-09-09 NOTE — Progress Notes (Signed)
Patient received dilaudid earlier and is now getting easily turned around. Patient said she is feeling confused. Upon leaving room patients called brother and was overheard saying she felt suicidal. Dr Juanetta GoslingHawkins notified. Tele psych ordered.

## 2014-09-09 NOTE — Progress Notes (Signed)
This is documentation of my note from the 14th. She was seen in the MRI room after finishing her MRI. She said that she had severe pain. She was asking to be taken back to her room so she could get something for her pain. Exam showed that she was awake and alert appeared to be in some distress. Chest clear heart regular I will await the results of her MRI and I will order her something for pain.

## 2014-09-09 NOTE — Progress Notes (Addendum)
Discussed with Dr. Jeral FruitBotero   He recommends epidural steroid injection and then if this is not effective he will do surgery either this Friday or early next week.

## 2014-09-09 NOTE — BH Assessment (Signed)
[facility]    Called pt's nurse, Victorino DikeJennifer [phone]- [phone] -  to schedule pt's tele assessment with this clinician.  Tele assessment to be completed.  Casimer LaniusKristen Racquelle Hyser, MS, [facility] Therapeutic Triage Specialist [facility]

## 2014-09-09 NOTE — Progress Notes (Signed)
Subjective: She says she still has severe pain in her left leg. She has fallen at home on several occasions. Her MRI really did not show anything that would explain her pain.  Objective: Vital signs in last 24 hours: Temp:  [98 F (36.7 C)-98.8 F (37.1 C)] 98 F (36.7 C) (03/15 0609) Pulse Rate:  [80-105] 80 (03/15 0609) Resp:  [18-22] 18 (03/15 0609) BP: (127-136)/(43-51) 136/51 mmHg (03/15 0609) SpO2:  [99 %-100 %] 100 % (03/15 0609) Weight change:  Last BM Date: 09/05/14  Intake/Output from previous day: 03/14 0701 - 03/15 0700 In: 3028.8 [P.O.:480; I.V.:2548.8] Out: 200 [Urine:200]  PHYSICAL EXAM General appearance: alert and moderate distress Resp: clear to auscultation bilaterally Cardio: regular rate and rhythm, S1, S2 normal, no murmur, click, rub or gallop GI: soft, non-tender; bowel sounds normal; no masses,  no organomegaly Extremities: extremities normal, atraumatic, no cyanosis or edema  Lab Results:  Results for orders placed or performed during the hospital encounter of 09/07/14 (from the past 48 hour(s))  CBC with Differential/Platelet     Status: Abnormal   Collection Time: 09/07/14  6:00 PM  Result Value Ref Range   WBC 10.2 4.0 - 10.5 K/uL   RBC 3.60 (L) 3.87 - 5.11 MIL/uL   Hemoglobin 12.0 12.0 - 15.0 g/dL   HCT 37.5 36.0 - 46.0 %   MCV 104.2 (H) 78.0 - 100.0 fL   MCH 33.3 26.0 - 34.0 pg   MCHC 32.0 30.0 - 36.0 g/dL   RDW 13.7 11.5 - 15.5 %   Platelets 386 150 - 400 K/uL   Neutrophils Relative % 64 43 - 77 %   Neutro Abs 6.5 1.7 - 7.7 K/uL   Lymphocytes Relative 26 12 - 46 %   Lymphs Abs 2.7 0.7 - 4.0 K/uL   Monocytes Relative 7 3 - 12 %   Monocytes Absolute 0.8 0.1 - 1.0 K/uL   Eosinophils Relative 2 0 - 5 %   Eosinophils Absolute 0.2 0.0 - 0.7 K/uL   Basophils Relative 1 0 - 1 %   Basophils Absolute 0.1 0.0 - 0.1 K/uL  Basic metabolic panel     Status: Abnormal   Collection Time: 09/07/14  6:00 PM  Result Value Ref Range   Sodium 141 135  - 145 mmol/L   Potassium 4.3 3.5 - 5.1 mmol/L   Chloride 110 96 - 112 mmol/L   CO2 22 19 - 32 mmol/L   Glucose, Bld 55 (L) 70 - 99 mg/dL   BUN 29 (H) 6 - 23 mg/dL   Creatinine, Ser 1.38 (H) 0.50 - 1.10 mg/dL   Calcium 9.4 8.4 - 10.5 mg/dL   GFR calc non Af Amer 35 (L) >90 mL/min   GFR calc Af Amer 40 (L) >90 mL/min    Comment: (NOTE) The eGFR has been calculated using the CKD EPI equation. This calculation has not been validated in all clinical situations. eGFR's persistently <90 mL/min signify possible Chronic Kidney Disease.    Anion gap 9 5 - 15  TSH     Status: None   Collection Time: 09/07/14  6:00 PM  Result Value Ref Range   TSH 1.348 0.350 - 4.500 uIU/mL  CBG monitoring, ED     Status: None   Collection Time: 09/07/14  8:03 PM  Result Value Ref Range   Glucose-Capillary 89 70 - 99 mg/dL   Comment 1 Notify RN    Comment 2 Document in Chart   Basic metabolic panel  Status: Abnormal   Collection Time: 09/08/14  6:24 AM  Result Value Ref Range   Sodium 142 135 - 145 mmol/L   Potassium 3.9 3.5 - 5.1 mmol/L   Chloride 114 (H) 96 - 112 mmol/L   CO2 24 19 - 32 mmol/L   Glucose, Bld 48 (L) 70 - 99 mg/dL   BUN 21 6 - 23 mg/dL   Creatinine, Ser 0.92 0.50 - 1.10 mg/dL   Calcium 8.8 8.4 - 10.5 mg/dL   GFR calc non Af Amer 57 (L) >90 mL/min   GFR calc Af Amer 66 (L) >90 mL/min    Comment: (NOTE) The eGFR has been calculated using the CKD EPI equation. This calculation has not been validated in all clinical situations. eGFR's persistently <90 mL/min signify possible Chronic Kidney Disease.    Anion gap 4 (L) 5 - 15  CBC     Status: Abnormal   Collection Time: 09/08/14  6:24 AM  Result Value Ref Range   WBC 8.0 4.0 - 10.5 K/uL   RBC 3.29 (L) 3.87 - 5.11 MIL/uL   Hemoglobin 10.8 (L) 12.0 - 15.0 g/dL   HCT 34.6 (L) 36.0 - 46.0 %   MCV 105.2 (H) 78.0 - 100.0 fL   MCH 32.8 26.0 - 34.0 pg   MCHC 31.2 30.0 - 36.0 g/dL   RDW 13.9 11.5 - 15.5 %   Platelets 370 150 - 400  K/uL  Glucose, capillary     Status: Abnormal   Collection Time: 09/08/14  7:48 AM  Result Value Ref Range   Glucose-Capillary 21 (LL) 70 - 99 mg/dL   Comment 1 Notify RN   Glucose, capillary     Status: None   Collection Time: 09/08/14  8:21 AM  Result Value Ref Range   Glucose-Capillary 94 70 - 99 mg/dL  Glucose, capillary     Status: Abnormal   Collection Time: 09/08/14 11:51 AM  Result Value Ref Range   Glucose-Capillary 110 (H) 70 - 99 mg/dL  Glucose, capillary     Status: Abnormal   Collection Time: 09/08/14  5:18 PM  Result Value Ref Range   Glucose-Capillary 106 (H) 70 - 99 mg/dL  Glucose, capillary     Status: Abnormal   Collection Time: 09/08/14  9:25 PM  Result Value Ref Range   Glucose-Capillary 135 (H) 70 - 99 mg/dL  Glucose, capillary     Status: Abnormal   Collection Time: 09/09/14  8:11 AM  Result Value Ref Range   Glucose-Capillary 119 (H) 70 - 99 mg/dL    ABGS No results for input(s): PHART, PO2ART, TCO2, HCO3 in the last 72 hours.  Invalid input(s): PCO2 CULTURES No results found for this or any previous visit (from the past 240 hour(s)). Studies/Results: Mr Lumbar Spine Wo Contrast  09/08/2014   CLINICAL DATA:  Left leg pain. Lower extremity weakness. Previous lumbar decompression. Multiple falls at home in the last week.  EXAM: MRI LUMBAR SPINE WITHOUT CONTRAST  TECHNIQUE: Multiplanar, multisequence MR imaging of the lumbar spine was performed. No intravenous contrast was administered.  COMPARISON:  Radiography 08/19/2014.  MRI 03/04/2014.  FINDINGS: There is mild curvature convex to the left with the apex at L3.  T11-12:  Small disc bulge without neural compression.  T12-L1: Small disc bulge without neural compression.  L1-2:  Normal interspace.  L2-3: Previous posterior decompression. Endplate osteophytes and bulging of the disc. Mild narrowing of the lateral recesses without gross neural compression. Mild foraminal stenosis bilaterally.  L3-4:  Previous  posterior decompression. Endplate osteophytes and mild bulging of the disc. Mild stenosis of both neural foramina without gross neural compression.  L4-5: Previous posterior decompression. Endplate osteophytes and shallow protrusion of disc material. Narrowing of the lateral recesses. Foraminal narrowing left more than right.  L5-S1: Previous posterior decompression. Minimal bulging of the disc. Mild facet degeneration. No significant stenosis.  Compared to the previous study, no change is seen since September 2015.  IMPRESSION: No change since September 2015. Posterior decompression at L2-3, L3-4, L4-5 and L5-S1. No compressive central canal stenosis. Narrowing of the lateral recesses and foramina at L4-5 could be symptomatic, but is unchanged since 06-Sep-2013. Other less pronounced degenerative changes as outlined above.   Electronically Signed   By: Nelson Chimes M.D.   On: 09/08/2014 09:35    Medications:  Prior to Admission:  Prescriptions prior to admission  Medication Sig Dispense Refill Last Dose  . acetaminophen (TYLENOL) 500 MG tablet Take 1,000 mg by mouth every 6 (six) hours as needed for mild pain or headache.   09/07/2014  . amLODipine (NORVASC) 10 MG tablet Take 10 mg by mouth daily.     09/07/2014  . aspirin EC 81 MG tablet Take 81 mg by mouth at bedtime.   09/06/2014  . colesevelam (WELCHOL) 625 MG tablet Take 1,875 mg by mouth 2 (two) times daily with a meal.    09/07/2014  . diclofenac (VOLTAREN) 75 MG EC tablet Take 75 mg by mouth 2 (two) times daily.   09/07/2014 at \  . ezetimibe (ZETIA) 10 MG tablet Take 10 mg by mouth daily.     09/07/2014  . lanolin ointment Apply 1 application topically at bedtime.   09/06/2014  . lisinopril (PRINIVIL,ZESTRIL) 10 MG tablet Take 10 mg by mouth daily.     09/07/2014  . omeprazole (PRILOSEC) 40 MG capsule Take 40 mg by mouth daily.     09/07/2014  . phenylephrine (NEO-SYNEPHRINE) 1 % nasal spray Place 1 drop into both nostrils 3 (three) times daily as needed  for congestion.   09/07/2014  . pioglitazone-glimepiride (DUETACT) 30-4 MG per tablet Take 1 tablet by mouth daily with breakfast.     09/07/2014  . rosuvastatin (CRESTOR) 20 MG tablet Take 20 mg by mouth daily.     09/07/2014  . sitaGLIPtin (JANUVIA) 100 MG tablet Take 100 mg by mouth daily.     09/07/2014  . diazepam (VALIUM) 5 MG tablet Take 1 tablet (5 mg total) by mouth every 6 (six) hours as needed (spasms). (Patient not taking: Reported on 09/07/2014) 10 tablet 0    Scheduled: . amLODipine  10 mg Oral Daily  . aspirin EC  81 mg Oral QHS  . colesevelam  1,875 mg Oral BID WC  . ezetimibe  10 mg Oral Daily  . feeding supplement (ENSURE COMPLETE)  237 mL Oral BID BM  . heparin  5,000 Units Subcutaneous 3 times per day  . rosuvastatin  20 mg Oral Daily   Continuous: . sodium chloride 75 mL/hr at 09/09/14 0900   ACZ:[id number], alum & mag hydroxide-simeth, HYDROmorphone (DILAUDID) injection, ondansetron **OR** ondansetron (ZOFRAN) IV, oxyCODONE-acetaminophen **AND** oxyCODONE, phenylephrine  Assesment: She is admitted with failure to thrive. She has severe pain in her leg. She's had surgery for lumbar stenosis and I will discuss with her neurosurgeon whether doing surgery again would help. Principal Problem:   FTT (failure to thrive) in adult Active Problems:   Peripheral arterial disease   Lumbar stenosis with neurogenic claudication  Generalized weakness   AKI (acute kidney injury)   Dehydration    Plan: As above she said that her previous pain medication that I gave her from the office worked fairly well so I will have her start back on that    LOS: 2 days   Immanuel Fedak L 09/09/2014, 9:16 AM

## 2014-09-10 ENCOUNTER — Ambulatory Visit (HOSPITAL_COMMUNITY)
Admission: RE | Admit: 2014-09-10 | Discharge: 2014-09-10 | Disposition: A | Discharge status: Home / Self Care | Payer: Medicare Other | Source: Ambulatory Visit | Attending: Pulmonary Disease | Admitting: Pulmonary Disease

## 2014-09-10 ENCOUNTER — Ambulatory Visit (HOSPITAL_COMMUNITY): Payer: BLUE CROSS/BLUE SHIELD

## 2014-09-10 DIAGNOSIS — I739 Peripheral vascular disease, unspecified: Secondary | ICD-10-CM

## 2014-09-10 DIAGNOSIS — N179 Acute kidney failure, unspecified: Secondary | ICD-10-CM

## 2014-09-10 DIAGNOSIS — M79606 Pain in leg, unspecified: Secondary | ICD-10-CM

## 2014-09-10 DIAGNOSIS — M47897 Other spondylosis, lumbosacral region: Secondary | ICD-10-CM

## 2014-09-10 DIAGNOSIS — M79604 Pain in right leg: Secondary | ICD-10-CM

## 2014-09-10 DIAGNOSIS — M79605 Pain in left leg: Principal | ICD-10-CM

## 2014-09-10 DIAGNOSIS — M48062 Spinal stenosis, lumbar region with neurogenic claudication: Secondary | ICD-10-CM

## 2014-09-10 DIAGNOSIS — R531 Weakness: Secondary | ICD-10-CM

## 2014-09-10 DIAGNOSIS — E860 Dehydration: Secondary | ICD-10-CM

## 2014-09-10 DIAGNOSIS — R627 Adult failure to thrive: Secondary | ICD-10-CM

## 2014-09-10 LAB — GLUCOSE, CAPILLARY
GLUCOSE-CAPILLARY: 131 mg/dL — AB (ref 70–99)
GLUCOSE-CAPILLARY: 159 mg/dL — AB (ref 70–99)
Glucose-Capillary: 126 mg/dL — ABNORMAL HIGH (ref 70–99)
Glucose-Capillary: 170 mg/dL — ABNORMAL HIGH (ref 70–99)

## 2014-09-10 MED ORDER — SODIUM CHLORIDE 0.9 % IJ SOLN
INTRAMUSCULAR | Status: AC
  Filled 2014-09-10: qty 10

## 2014-09-10 MED ORDER — LIDOCAINE HCL (PF) 1 % IJ SOLN
INTRAMUSCULAR | Status: AC
  Filled 2014-09-10: qty 5

## 2014-09-10 MED ORDER — IOHEXOL 180 MG/ML  SOLN
20.0000 mL | Freq: Once | INTRAMUSCULAR | Status: AC | PRN
  Administered 2014-09-10: 5 mL via INTRAVENOUS

## 2014-09-10 MED ORDER — METHYLPREDNISOLONE ACETATE 40 MG/ML IJ SUSP
INTRAMUSCULAR | Status: AC
  Administered 2014-09-10: 40 mg
  Filled 2014-09-10: qty 1

## 2014-09-10 MED ORDER — METHYLPREDNISOLONE ACETATE 80 MG/ML IJ SUSP
INTRAMUSCULAR | Status: AC
  Administered 2014-09-10: 80 mg
  Filled 2014-09-10: qty 1

## 2014-09-10 MED ORDER — HEPARIN SODIUM (PORCINE) 5000 UNIT/ML IJ SOLN
5000.0000 [IU] | Freq: Three times a day (TID) | INTRAMUSCULAR | Status: DC
  Administered 2014-09-10 – 2014-09-22 (×28): 5000 [IU] via SUBCUTANEOUS
  Filled 2014-09-10 (×28): qty 1

## 2014-09-10 NOTE — Progress Notes (Signed)
Patient is attempting to get out bed without assistance and stating that she is in a nursing home.  Patient has been educated that she is not in a nursing home and that she can get up with assistance.  Patient was assisted to bedside commode by nurse and nurse tech.  Patient then requested to get back to bed and was assisted to bed by nurse and nurse tech.  Patient was then educated that she could not get up alone for safety reasons but could get out of bed with assistance.

## 2014-09-10 NOTE — Progress Notes (Signed)
Subjective: She says she is about the same. She still has pain in her back. [facility] plus minus local anesthetic and see if it makes a difference with her pain. If this is effective for her pain she can continue with injections and if not she will need surgery  Objective: Vital signs in last 24 hours: Temp:  [97.9 F (36.6 C)] 97.9 F (36.6 C) (03/15 1517) Pulse Rate:  [80] 80 (03/15 1517) Resp:  [18] 18 (03/15 1517) BP: (142)/(52) 142/52 mmHg (03/15 1517) SpO2:  [96 %] 96 % (03/15 1517) Weight change:  Last BM Date: 09/05/14  Intake/Output from previous day: 03/15 0701 - 03/16 0700 In: 120 [P.O.:120] Out: 100 [Urine:100]  PHYSICAL EXAM General appearance: alert, cooperative and moderate distress Resp: clear to auscultation bilaterally Cardio: regular rate and rhythm, S1, S2 normal, no murmur, click, rub or gallop GI: soft, non-tender; bowel sounds normal; no masses,  no organomegaly Extremities: extremities normal, atraumatic, no cyanosis or edema  Lab Results:  Results for orders placed or performed during the hospital encounter of 09/07/14 (from the past 48 hour(s))  Glucose, capillary     Status: Abnormal   Collection Time: 09/08/14 11:51 AM  Result Value Ref Range   Glucose-Capillary 110 (H) 70 - 99 mg/dL  Glucose, capillary     Status: Abnormal   Collection Time: 09/08/14  5:18 PM  Result Value Ref Range   Glucose-Capillary 106 (H) 70 - 99 mg/dL  Glucose, capillary     Status: Abnormal   Collection Time: 09/08/14  9:25 PM  Result Value Ref Range   Glucose-Capillary 135 (H) 70 - 99 mg/dL  Glucose, capillary     Status: Abnormal   Collection Time: 09/09/14  8:11 AM  Result Value Ref Range   Glucose-Capillary 119 (H) 70 - 99 mg/dL  Glucose, capillary     Status: Abnormal   Collection Time: 09/09/14 12:14 PM  Result Value Ref Range   Glucose-Capillary 142 (H) 70 - 99 mg/dL  Glucose, capillary     Status: Abnormal    Collection Time: 09/09/14  4:53 PM  Result Value Ref Range   Glucose-Capillary 117 (H) 70 - 99 mg/dL   Comment 1 Notify RN    Comment 2 Document in Chart   Glucose, capillary     Status: Abnormal   Collection Time: 09/09/14  8:39 PM  Result Value Ref Range   Glucose-Capillary 121 (H) 70 - 99 mg/dL    ABGS No results for input(s): PHART, PO2ART, TCO2, HCO3 in the last 72 hours.  Invalid input(s): PCO2 CULTURES No results found for this or any previous visit (from the past 240 hour(s)). Studies/Results: Mr Lumbar Spine Wo Contrast  09/08/2014   CLINICAL DATA:  Left leg pain. Lower extremity weakness. Previous lumbar decompression. Multiple falls at home in the last week.  EXAM: MRI LUMBAR SPINE WITHOUT CONTRAST  TECHNIQUE: Multiplanar, multisequence MR imaging of the lumbar spine was performed. No intravenous contrast was administered.  COMPARISON:  Radiography 08/19/2014.  MRI 03/04/2014.  FINDINGS: There is mild curvature convex to the left with the apex at L3.  T11-12:  Small disc bulge without neural compression.  T12-L1: Small disc bulge without neural compression.  L1-2:  Normal interspace.  L2-3: Previous posterior decompression. Endplate osteophytes and bulging of the disc. Mild narrowing of the lateral recesses without gross neural compression. Mild foraminal stenosis bilaterally.  L3-4: Previous posterior decompression. Endplate osteophytes and mild bulging of  the disc. Mild stenosis of both neural foramina without gross neural compression.  L4-5: Previous posterior decompression. Endplate osteophytes and shallow protrusion of disc material. Narrowing of the lateral recesses. Foraminal narrowing left more than right.  L5-S1: Previous posterior decompression. Minimal bulging of the disc. Mild facet degeneration. No significant stenosis.  Compared to the previous study, no change is seen since September 2015.  IMPRESSION: No change since September 2015. Posterior decompression at L2-3, L3-4,  L4-5 and L5-S1. No compressive central canal stenosis. Narrowing of the lateral recesses and foramina at L4-5 could be symptomatic, but is unchanged since 2013/11/22. Other less pronounced degenerative changes as outlined above.   Electronically Signed   By: Paulina Fusi M.D.   On: 09/08/2014 09:35    Medications:  Prior to Admission:  Prescriptions prior to admission  Medication Sig Dispense Refill Last Dose  . acetaminophen (TYLENOL) 500 MG tablet Take 1,000 mg by mouth every 6 (six) hours as needed for mild pain or headache.   09/07/2014  . amLODipine (NORVASC) 10 MG tablet Take 10 mg by mouth daily.     09/07/2014  . aspirin EC 81 MG tablet Take 81 mg by mouth at bedtime.   09/06/2014  . colesevelam (WELCHOL) 625 MG tablet Take 1,875 mg by mouth 2 (two) times daily with a meal.    09/07/2014  . diclofenac (VOLTAREN) 75 MG EC tablet Take 75 mg by mouth 2 (two) times daily.   09/07/2014 at \  . ezetimibe (ZETIA) 10 MG tablet Take 10 mg by mouth daily.     09/07/2014  . lanolin ointment Apply 1 application topically at bedtime.   09/06/2014  . lisinopril (PRINIVIL,ZESTRIL) 10 MG tablet Take 10 mg by mouth daily.     09/07/2014  . omeprazole (PRILOSEC) 40 MG capsule Take 40 mg by mouth daily.     09/07/2014  . phenylephrine (NEO-SYNEPHRINE) 1 % nasal spray Place 1 drop into both nostrils 3 (three) times daily as needed for congestion.   09/07/2014  . pioglitazone-glimepiride (DUETACT) 30-4 MG per tablet Take 1 tablet by mouth daily with breakfast.     09/07/2014  . rosuvastatin (CRESTOR) 20 MG tablet Take 20 mg by mouth daily.     09/07/2014  . sitaGLIPtin (JANUVIA) 100 MG tablet Take 100 mg by mouth daily.     09/07/2014  . diazepam (VALIUM) 5 MG tablet Take 1 tablet (5 mg total) by mouth every 6 (six) hours as needed (spasms). (Patient not taking: Reported on 09/07/2014) 10 tablet 0    Scheduled: . amLODipine  10 mg Oral Daily  . aspirin EC  81 mg Oral QHS  . colesevelam  1,875 mg Oral BID WC  . ezetimibe   10 mg Oral Daily  . feeding supplement (ENSURE COMPLETE)  237 mL Oral BID BM  . heparin  5,000 Units Subcutaneous 3 times per day  . rosuvastatin  20 mg Oral Daily   Continuous: . sodium chloride 75 mL/hr at 09/09/14 0900   ZOX:[id number], alum & mag hydroxide-simeth, HYDROmorphone (DILAUDID) injection, ondansetron **OR** ondansetron (ZOFRAN) IV, oxyCODONE-acetaminophen **AND** oxyCODONE, phenylephrine  Assesment: She was admitted with failure to thrive and left leg radicular pain. She is known to have lumbar spinal stenosis and has had neurogenic claudication. Yesterday she was overheard saying that she wanted to kill herself but she has been cleared by psychiatry. She says that that was just a manner of speaking and that she really has no plans to harm herself or anyone else.  Principal Problem:   FTT (failure to thrive) in adult Active Problems:   Peripheral arterial disease   Lumbar stenosis with neurogenic claudication   Generalized weakness   AKI (acute kidney injury)   Dehydration    Plan: She will go to [facility] for injection today    LOS: 3 days   Jayceon Troy L 09/10/2014, 8:37 AM

## 2014-09-10 NOTE — Progress Notes (Addendum)
PT Cancellation Note  Patient Details Name: Tina [name] MRN: [id number] DOB: 02/23/1931   Cancelled Treatment:    Reason Eval/Treat Not Completed: Pain limiting ability to participate. . After 3 attempts made to complete PT Evaluation with all attempts, Patient decline evaluation and  Activity is limited by pain. Patient will be discharged from PT. Will wait for any orders.    Tametra Ahart A 09/10/2014, 9:01 AM

## 2014-09-10 NOTE — Progress Notes (Signed)
Report given to [facility] with [facility].

## 2014-09-10 NOTE — Progress Notes (Signed)
Patient ID: Tina [name], female   DOB: 10/01/1930, 81 y.o.   MRN: [id number]   Pt with low back pain radiating to L leg Dr Jeral FruitBotero has recommended Epi steroid injection  Dr Bonnielee HaffHoss has reviewed imaging and approves procedure  Pt to arrive [facility] via ambulance 1230 today RN aware orders in to hold Hep inj today

## 2014-09-10 NOTE — Procedures (Signed)
Caudal epidural injection 120 mg depomedrol 5 cc saine 3 cc 1% lidocaine No comp

## 2014-09-11 LAB — GLUCOSE, CAPILLARY
GLUCOSE-CAPILLARY: 153 mg/dL — AB (ref 70–99)
GLUCOSE-CAPILLARY: 141 mg/dL — AB (ref 70–99)
Glucose-Capillary: 153 mg/dL — ABNORMAL HIGH (ref 70–99)
Glucose-Capillary: 140 mg/dL — ABNORMAL HIGH (ref 70–99)

## 2014-09-11 NOTE — Progress Notes (Signed)
Pt transferred from AP into room 4n32, alert and oriented but c/o of pain in the left hip, pt settled in room, call light within pt's reach, was however reassured and will continue to monitor, v/s stable. Obasogie-Asidi, Langley Ingalls Efe

## 2014-09-11 NOTE — Progress Notes (Signed)
IV team came up at 8:00AM and attempted IV access, but patient refused any further attempts. Patient now complaining of severe pain, 10/10, after maneuvering on and off the bed pan. She would like IV team to try getting access again. Patient was just given oral pain medicine and heat packs, and IV team attempting to gain IV access with AccuVein. Will give IV pain medication when IV access is available.

## 2014-09-11 NOTE — Progress Notes (Signed)
Patient's IV was infiltrated upon assessment. It was promptly removed and a heat pack was applied. IV team was called and stated they would be up shortly to place a new IV.

## 2014-09-11 NOTE — Progress Notes (Signed)
[facility] at time of transport. She left floor via stretcher, escorted by [facility]. All belongings, including wheelchair and medications from pharmacy sent with her. Report called to nursing staff [facility].

## 2014-09-11 NOTE — Progress Notes (Signed)
PENDING ACCEPTANCE TRANFER NOTE:  Call received from:    Dr Juanetta GoslingHawkins  REASON FOR REQUESTING TRANSFER:    Needs back surgery  CC: Back pain/neurological claudication HPI:   81 year old with past medical history of lumbar stenosis, had surgery done in January 2015 and she had L2, 3, 4, 5 laminectomy and foraminotomy came in back with severe back pain and increase claudication. Dr. Cecille AmsterdamHokanson spoke with Dr. Jeral FruitBotero who recommended steroid injection, it was done and did not help so he recommended [facility] on Saturday. Please notify Dr. Jeral FruitBotero upon [facility]   PLAN:  According to telephone report, this patient [facility],   [facility]  [facility],  I have requested an order be written to call Flow Manager at [phone] upon patient arrival to the floor for final physician assignment who will do the admission and give admitting orders.  SIGNED: Clint LippsELMAHI,Angla Delahunt A, MD [facility]  09/11/2014, 1:09 PM

## 2014-09-11 NOTE — Progress Notes (Signed)
She says she had no relief from the injection. Per my earlier discussion with Dr. Jeral FruitBotero I will call him regarding operative treatment

## 2014-09-12 LAB — COMPREHENSIVE METABOLIC PANEL
ALK PHOS: 67 U/L (ref 39–117)
ALT: 26 U/L (ref 0–35)
AST: 33 U/L (ref 0–37)
Albumin: 3.1 g/dL — ABNORMAL LOW (ref 3.5–5.2)
Anion gap: 9 (ref 5–15)
BILIRUBIN TOTAL: 0.6 mg/dL (ref 0.3–1.2)
BUN: 12 mg/dL (ref 6–23)
CHLORIDE: 108 mmol/L (ref 96–112)
CO2: 23 mmol/L (ref 19–32)
Calcium: 9.1 mg/dL (ref 8.4–10.5)
Creatinine, Ser: 0.74 mg/dL (ref 0.50–1.10)
GFR calc non Af Amer: 78 mL/min — ABNORMAL LOW (ref >90)
GLUCOSE: 126 mg/dL — AB (ref 70–99)
POTASSIUM: 4.8 mmol/L (ref 3.5–5.1)
Sodium: 140 mmol/L (ref 135–145)
Total Protein: 6 g/dL (ref 6.0–8.3)

## 2014-09-12 LAB — GLUCOSE, CAPILLARY
GLUCOSE-CAPILLARY: 140 mg/dL — AB (ref 70–99)
Glucose-Capillary: 137 mg/dL — ABNORMAL HIGH (ref 70–99)
Glucose-Capillary: 125 mg/dL — ABNORMAL HIGH (ref 70–99)
Glucose-Capillary: 206 mg/dL — ABNORMAL HIGH (ref 70–99)

## 2014-09-12 MED ORDER — METHOCARBAMOL 500 MG PO TABS
500.0000 mg | ORAL_TABLET | Freq: Two times a day (BID) | ORAL | Status: DC
  Administered 2014-09-12 – 2014-09-13 (×3): 500 mg via ORAL
  Filled 2014-09-12 (×3): qty 1

## 2014-09-12 MED ORDER — ALPRAZOLAM 0.5 MG PO TABS
0.5000 mg | ORAL_TABLET | Freq: Two times a day (BID) | ORAL | Status: DC | PRN
  Administered 2014-09-12 – 2014-09-22 (×11): 0.5 mg via ORAL
  Filled 2014-09-12 (×11): qty 1

## 2014-09-12 MED ORDER — INSULIN ASPART 100 UNIT/ML ~~LOC~~ SOLN
0.0000 [IU] | Freq: Three times a day (TID) | SUBCUTANEOUS | Status: DC
  Administered 2014-09-12: 1 [IU] via SUBCUTANEOUS
  Administered 2014-09-12: 3 [IU] via SUBCUTANEOUS
  Administered 2014-09-13 – 2014-09-14 (×6): 1 [IU] via SUBCUTANEOUS
  Administered 2014-09-15: 2 [IU] via SUBCUTANEOUS
  Administered 2014-09-15 (×2): 1 [IU] via SUBCUTANEOUS
  Administered 2014-09-16 (×2): 3 [IU] via SUBCUTANEOUS
  Administered 2014-09-16 – 2014-09-17 (×2): 2 [IU] via SUBCUTANEOUS
  Administered 2014-09-17: 3 [IU] via SUBCUTANEOUS
  Administered 2014-09-18 – 2014-09-19 (×4): 2 [IU] via SUBCUTANEOUS
  Administered 2014-09-19: 3 [IU] via SUBCUTANEOUS
  Administered 2014-09-20 (×2): 2 [IU] via SUBCUTANEOUS
  Administered 2014-09-20 – 2014-09-21 (×2): 3 [IU] via SUBCUTANEOUS
  Administered 2014-09-21 – 2014-09-22 (×4): 2 [IU] via SUBCUTANEOUS

## 2014-09-12 MED ORDER — OXYCODONE-ACETAMINOPHEN 5-325 MG PO TABS
1.0000 | ORAL_TABLET | ORAL | Status: DC | PRN
  Administered 2014-09-12 – 2014-09-16 (×21): 1 via ORAL
  Filled 2014-09-12 (×21): qty 1

## 2014-09-12 MED ORDER — OXYCODONE HCL 5 MG PO TABS
5.0000 mg | ORAL_TABLET | ORAL | Status: DC | PRN
  Administered 2014-09-12 – 2014-09-16 (×20): 5 mg via ORAL
  Filled 2014-09-12 (×20): qty 1

## 2014-09-12 NOTE — Progress Notes (Signed)
[facility] PROGRESS NOTE  JALISA SACCO ZOX:[id number] DOB: 12-Nov-1930 DOA: 09/07/2014 PCP: Fredirick Maudlin, MD  Assessment/Plan: Principal Problem:   FTT (failure to thrive) in adult Active Problems:   Peripheral arterial disease   Lumbar stenosis with neurogenic claudication   Generalized weakness   AKI (acute kidney injury)   Dehydration    Left leg radiculopathy/lumbar spinal stenosis/neurogenic claudication Patient complains of uncontrolled pain Started the patient on IV Dilaudid, scheduled Robaxin Physical/ occupational therapy will also be ordered once assessed by neurosurgery, Called Dr. Jeral Fruit , to come and see the patient  Hypertension Continue Norvasc and restart lisinopril  Diabetes Continue Accu-Cheks and start sliding scale insulin  Dyslipidemia continue zetia  and Crestor  Anxiety/  dissatisfied with nursing Started the patient on low-dose of Xanax    Code Status: full Family Communication: family updated about patient's clinical progress Disposition Plan:  As above    Brief narrative: JESELLE [name] is a 81 y.o. female with a past medical history of lumbar stenosis with neurogenic claudication, admitted to the neurosurgery service and in January 2015, admitted for severe back pain associate with urinary incontinence, undergoing bilateral 2,3,4,5 laminectomy and foraminotomies decompression of the thecal sac and foraminotomies L1-L2 and L5-S1. She presents to the emergency department with complaints of worsening back pain associated with left lower extremity pain and weakness. She reports having at least 4 falls in her home over the past week as her lower extremity weakness has progressed. Patient expresses difficulties caring for herself as extremity weakness and neurological deficits have greatly affected her ability to perform activities of daily living. Initial lab work performed in the emergency room did not show evidence of infection as  UA was negative and she presented afebrile. Lumbar x-ray was unremarkable.  Consultants:  Neurosurgery    Procedures:  None  Antibiotics: None  HPI/Subjective: Patient uncontrolled, hypertensive, extremely anxious  Objective: Filed Vitals:   09/11/14 1954 09/12/14 0119 09/12/14 0623 09/12/14 0954  BP: 152/51 143/62 198/73 132/53  Pulse: 78 77 83 80  Temp: 99.1 F (37.3 C) 98.2 F (36.8 C) 97.9 F (36.6 C) 97.8 F (36.6 C)  TempSrc: Oral Oral Oral Oral  Resp: Height:      Weight:      SpO2: 99% 98% 100% 100%    Intake/Output Summary (Last 24 hours) at 09/12/14 1217 Last data filed at 09/11/14 1700  Gross per 24 hour  Intake    240 ml  Output    100 ml  Net    140 ml    Exam:  General: No acute respiratory distress Lungs: Clear to auscultation bilaterally without wheezes or crackles Cardiovascular: Regular rate and rhythm without murmur gallop or rub normal S1 and S2 Abdomen: Nontender, nondistended, soft, bowel sounds positive, no rebound, no ascites, no appreciable mass Extremities: No significant cyanosis, clubbing, or edema bilateral lower extremities      Data Reviewed: Basic Metabolic Panel:  Recent Labs Lab 09/07/14 1800 09/08/14 0624  NA 141 142  K 4.3 3.9  CL 110 114*  CO2 22 24  GLUCOSE 55* 48*  BUN 29* 21  CREATININE 1.38* 0.92  CALCIUM 9.4 8.8    Liver Function Tests: No results for input(s): AST, ALT, ALKPHOS, BILITOT, PROT, ALBUMIN in the last 168 hours. No results for input(s): LIPASE, AMYLASE in the last 168 hours. No results for input(s): AMMONIA in the last 168 hours.  CBC:  Recent Labs Lab 09/07/14 1800 09/08/14 8119  WBC 10.2 8.0  NEUTROABS 6.5  --   HGB 12.0 10.8*  HCT 37.5 34.6*  MCV 104.2* 105.2*  PLT 386 370    Cardiac Enzymes: No results for input(s): CKTOTAL, CKMB, CKMBINDEX, TROPONINI in the last 168 hours. BNP (last 3 results) No results for input(s): BNP in the last 8760  hours.  ProBNP (last 3 results) No results for input(s): PROBNP in the last 8760 hours.    CBG:  Recent Labs Lab 09/11/14 1149 09/11/14 1646 09/11/14 2205 09/12/14 0649 09/12/14 1141  GLUCAP 141* 153* 140* 137* 125*    No results found for this or any previous visit (from the past 240 hour(s)).   Studies: Dg Lumbar Spine Complete  08/19/2014   CLINICAL DATA:  Mid low back pain with ambulation.  EXAM: LUMBAR SPINE - COMPLETE 4+ VIEW  COMPARISON:  03/04/2014 MRI.  02/20/2014 plain films.  FINDINGS: Minimal S-shaped lumbar spine curvature. Sacroiliac joints are symmetric. Left hip osteoarthritis is moderate to marked. Dense aortic atherosclerosis. Maintenance of vertebral body height. Mild straightening of expected lordosis. Degenerative disc disease is advanced at L4-5, L3-4, and L2-3. Facet arthropathy involves lower lumbar spine.  IMPRESSION: Advanced spondylosis, without acute osseous finding.  Atherosclerosis.  Left hip osteoarthritis.   Electronically Signed   By: Jeronimo Greaves M.D.   On: 08/19/2014 12:06   Mr Lumbar Spine Wo Contrast  09/08/2014   CLINICAL DATA:  Left leg pain. Lower extremity weakness. Previous lumbar decompression. Multiple falls at home in the last week.  EXAM: MRI LUMBAR SPINE WITHOUT CONTRAST  TECHNIQUE: Multiplanar, multisequence MR imaging of the lumbar spine was performed. No intravenous contrast was administered.  COMPARISON:  Radiography 08/19/2014.  MRI 03/04/2014.  FINDINGS: There is mild curvature convex to the left with the apex at L3.  T11-12:  Small disc bulge without neural compression.  T12-L1: Small disc bulge without neural compression.  L1-2:  Normal interspace.  L2-3: Previous posterior decompression. Endplate osteophytes and bulging of the disc. Mild narrowing of the lateral recesses without gross neural compression. Mild foraminal stenosis bilaterally.  L3-4: Previous posterior decompression. Endplate osteophytes and mild bulging of the disc. Mild  stenosis of both neural foramina without gross neural compression.  L4-5: Previous posterior decompression. Endplate osteophytes and shallow protrusion of disc material. Narrowing of the lateral recesses. Foraminal narrowing left more than right.  L5-S1: Previous posterior decompression. Minimal bulging of the disc. Mild facet degeneration. No significant stenosis.  Compared to the previous study, no change is seen since September 2015.  IMPRESSION: No change since September 2015. Posterior decompression at L2-3, L3-4, L4-5 and L5-S1. No compressive central canal stenosis. Narrowing of the lateral recesses and foramina at L4-5 could be symptomatic, but is unchanged since Nov 03, 2013. Other less pronounced degenerative changes as outlined above.   Electronically Signed   By: Paulina Fusi M.D.   On: 09/08/2014 09:35   Ir Fluoro Guide Ndl Plmt / Bx  09/10/2014   CLINICAL DATA:  Lumbosacral spondylosis without myelopathy  FLUOROSCOPY TIME:  36 seconds.  PROCEDURE: CAUDAL EPIDURAL INJECTION  Utilizing a caudal approach, the skin overlying the sacral hiatus was cleansed and anesthetized. A 20 gauge Crawford epidural needle was advanced into the sacral epidural space. Injection of Omnipaque 180 shows a good epidural pattern with spread up to L5-S1. No vascular opacification is seen.  120 mg of Depo-Medrol mixed with 5 cc of normal saline and 3 cc of 1% Lidocaine were instilled. The procedure was well-tolerated, and the patient was discharged  thirty minutes following the injection in good condition.  IMPRESSION: Technically successful caudal epidural injection.   Electronically Signed   By: Jolaine ClickArthur  Hoss M.D.   On: 09/10/2014 15:37    Scheduled Meds: . amLODipine  10 mg Oral Daily  . aspirin EC  81 mg Oral QHS  . colesevelam  1,875 mg Oral BID WC  . ezetimibe  10 mg Oral Daily  . feeding supplement (ENSURE COMPLETE)  237 mL Oral BID BM  . heparin  5,000 Units Subcutaneous 3 times per day  . insulin aspart  0-9 Units  Subcutaneous TID WC  . methocarbamol  500 mg Oral BID  . rosuvastatin  20 mg Oral Daily   Continuous Infusions: . sodium chloride 75 mL/hr at 09/11/14 2054    Principal Problem:   FTT (failure to thrive) in adult Active Problems:   Peripheral arterial disease   Lumbar stenosis with neurogenic claudication   Generalized weakness   AKI (acute kidney injury)   Dehydration    Time spent: 40 minutes   [facility] Foundation[facility]OL,Norissa Bartee  [facility] Pager [phone]. If 7PM-7AM, please contact night-coverage at [email or web address], password [email or web address] 09/12/2014, 12:17 PM  LOS: 5 days

## 2014-09-12 NOTE — Consult Note (Signed)
  Ms Tina [name] is a lady who underwent lumbar decompression in the past. She came to my office during the fall with pain to the left leg but wanted conservative treatment. She was [facility] with worsening of the left pain. Transferred to [facility] because the pain was not under contro. Mri lumbar shows some DDD  But nothing striking about  A surgical need. Clinically i can not find any weakness. We are gointg to schedule for a lumbar myelogram by the radilogy department.

## 2014-09-12 NOTE — Progress Notes (Signed)
PT Cancellation Note  Patient Details Name: Tina [name] MRN: [id number] DOB: 07/06/1930   Cancelled Treatment:    Reason Eval/Treat Not Completed: Patient declined, no reason specified Pt very anxious upon PT arrival. Declined participating in PT evaluation due to "needing to get some sleep." Explained importance of mobility esp if pt to have surgery however pt continue to decline with much confabulation. Will follow up next available time.   Alvie HeidelbergFolan, Annjanette Wertenberger A 09/12/2014, 2:06 PM Alvie HeidelbergShauna Folan, PT, DPT [phone]

## 2014-09-12 NOTE — Progress Notes (Signed)
   09/12/14 1400  Clinical Encounter Type  Visited With Patient  Visit Type Initial   Chaplain was referred to patient via spiritual care consult. When chaplain introduced himself, patient was very agitated. Patient said she does not see chaplains. Chaplain explained to her that he was referred to her and that he had no agenda. Patient explained she was really worried because she was going to surgery and needed time alone. Chaplain respected the patient's privacy and apologized for the interruption.  Destiney Sanabia, Tommi EmeryBlake R, Chaplain  2:36 PM

## 2014-09-13 LAB — CBC
HEMATOCRIT: 38.4 % (ref 36.0–46.0)
Hemoglobin: 12.4 g/dL (ref 12.0–15.0)
MCH: 33.2 pg (ref 26.0–34.0)
MCHC: 32.3 g/dL (ref 30.0–36.0)
MCV: 102.7 fL — AB (ref 78.0–100.0)
PLATELETS: 407 10*3/uL — AB (ref 150–400)
RBC: 3.74 MIL/uL — ABNORMAL LOW (ref 3.87–5.11)
RDW: 13.7 % (ref 11.5–15.5)
WBC: 11.8 10*3/uL — AB (ref 4.0–10.5)

## 2014-09-13 LAB — HEMOGLOBIN A1C
HEMOGLOBIN A1C: 5.8 % — AB (ref 4.8–5.6)
Mean Plasma Glucose: 120 mg/dL

## 2014-09-13 LAB — GLUCOSE, CAPILLARY
GLUCOSE-CAPILLARY: 143 mg/dL — AB (ref 70–99)
GLUCOSE-CAPILLARY: 150 mg/dL — AB (ref 70–99)
Glucose-Capillary: 131 mg/dL — ABNORMAL HIGH (ref 70–99)
Glucose-Capillary: 159 mg/dL — ABNORMAL HIGH (ref 70–99)

## 2014-09-13 MED ORDER — METHOCARBAMOL 500 MG PO TABS
500.0000 mg | ORAL_TABLET | Freq: Three times a day (TID) | ORAL | Status: DC
  Administered 2014-09-13 – 2014-09-22 (×24): 500 mg via ORAL
  Filled 2014-09-13 (×25): qty 1

## 2014-09-13 NOTE — Progress Notes (Signed)
PT Cancellation Note  Patient Details Name: Tina [name] MRN: [id number] DOB: 12/21/1930   Cancelled Treatment:    Reason Eval/Treat Not Completed: Patient not medically ready Pt very anxious about lumbar myelogram today. RN requests holding PT evaluation until after procedure. Will follow up next available time.   Alvie HeidelbergFolan, Evan Osburn A 09/13/2014, 1:07 PM Alvie HeidelbergShauna Folan, PT, DPT [phone]

## 2014-09-13 NOTE — Progress Notes (Signed)
Writer spoke with radiology tech and myelogram will not be done till Monday.

## 2014-09-13 NOTE — Evaluation (Signed)
Physical Therapy Evaluation Patient Details Name: Tina Tina [name] MRN: [id number] DOB: 12/27/1930 Today's Date: 09/13/2014   History of Present Illness  Patient is Tina Tina [name] y/o female with PMH of lumbar stenosis with neurogenic claudication, admitted in 06/2013 for severe back pain associated with UI s/p bil 2,3,4,5 laminectomy and foraminotomies decompression of the thecal sac and foraminotomies L1-L2 and L5-S1. Pt presents to the ED w/ complaints of worsening back pain associated with LLE pain and weakness. S/p caudal epidural injection 3/16. PMH of anxiety, PVD, DM, HTN and Hep Tina [name]. Planned for lumbar myelogram 3/21.      Clinical Impression  Patient presents with increased anxiety, pain in LLE and weakness impacting mobility. Pt requires max encouragement and coaxing to participate in therapy. Able to stand for Tina [name] few seconds with Mod Tina [name] and max cues. Declined ambulation. Pt lives at home alone in 2 story home and not able to care for self (shower on second level). Not safe to return home however pt refusing ST SNF. Pt would benefit from skilled PT to improve transfers, gait, balance and mobility so pt can ease burden of care and maximize independence. May be Tina Tina [name] for surgery pending results of myelogram.    Follow Up Recommendations SNF;Supervision/Assistance - 24 hour    Equipment Recommendations  None recommended by PT    Recommendations for Other Services OT consult     Precautions / Restrictions Precautions Precautions: Fall Precaution Comments: extremely anxious Restrictions Weight Bearing Restrictions: No      Mobility  Bed Mobility Overal bed mobility: Needs Assistance Bed Mobility: Rolling;Sidelying to Sit Rolling: Min guard Sidelying to sit: Mod assist;HOB elevated       General bed mobility comments: Rolling to R/L x3 to adjust chuck pads. Assist with bring BLEs to EOB, scoot bottom and elevate trunk. Very anxious during transfer, "now what..now what!, I  can't!"  Transfers Overall transfer level: Needs assistance Equipment used: Rolling walker (2 wheeled) Transfers: Sit to/from Stand Sit to Stand: Mod assist         General transfer comment: Mod Tina [name] to stand from EOB with cues for hand placement. Attempted to stand x1, able to get to half standing but returned to sitting due to pain, able to stand ons econd attempt with cues for hip extension and elbow extension.  Ambulation/Gait             General Gait Details: Deferred per patient.  Stairs            Wheelchair Mobility    Modified Rankin (Stroke Patients Only)       Balance Overall balance assessment: Needs assistance;History of Falls Sitting-balance support: Feet supported;No upper extremity supported Sitting balance-Leahy Scale: Fair     Standing balance support: During functional activity Standing balance-Leahy Scale: Poor Standing balance comment: Relient on RW for support. POor standing tolerance <15 sec secondary to pain in LLE.                             Pertinent Vitals/Pain Pain Assessment: Faces Faces Pain Scale: Hurts whole lot Pain Location: LLE with movement Pain Descriptors / Indicators: Sore;Aching;Sharp Pain Intervention(s): Limited activity within patient's tolerance;Monitored during session;Premedicated before session;Repositioned    Home Living Family/patient expects to be discharged to:: Skilled nursing facility Living Arrangements: Alone               Additional Comments: Pt lives alone in 2 level home with shower on  second level. Reports increased difficulty walking and getting around the last 3-4 weeks with increased falls.    Prior Function Level of Independence: Needs assistance   Gait / Transfers Assistance Needed: USes RW for ambulation. Reports driving.           Hand Dominance   Dominant Hand: Right    Extremity/Trunk Assessment   Upper Extremity Assessment: Defer to OT evaluation            Lower Extremity Assessment: Generalized weakness;LLE deficits/detail   LLE Deficits / Details: Grossly ~3/5 knee extension, 3/5 knee flexion, did not attempt hip flexion.     Communication   Communication: No difficulties  Cognition Arousal/Alertness: Awake/alert Behavior During Therapy: Anxious (Extremely anxious) Overall Cognitive Status: Within Functional Limits for tasks assessed Area of Impairment: Problem solving               General Comments: "I am not suicidal, but I wish I died in that surgery."    General Comments      Exercises        Assessment/Plan    PT Assessment Patient needs continued PT services  PT Diagnosis Generalized weakness;Acute pain;Difficulty walking   PT Problem List Decreased strength;Pain;Decreased cognition;Decreased activity tolerance;Decreased balance;Decreased mobility  PT Treatment Interventions Balance training;Gait training;Patient/family education;Functional mobility training;Therapeutic activities;Therapeutic exercise;Stair training   PT Goals (Current goals can be found in the Care Plan section) Acute Rehab PT Goals Patient Stated Goal: to be able to walk and do my shopping PT Goal Formulation: With patient Time For Goal Achievement: 09/27/14 Potential to Achieve Goals: Fair    Frequency Min 2X/week   Barriers to discharge Decreased caregiver support;Inaccessible home environment Pt lives alone, shower is on second level.    Co-evaluation               End of Session Equipment Utilized During Treatment: Gait belt Activity Tolerance: Patient limited by pain Patient left: in bed;with call bell/phone within reach;with bed alarm set Nurse Communication: Mobility status         Time: 1355-1415 PT Time Calculation (min) (ACUTE ONLY): 20 min   Charges:   PT Evaluation $Initial PT Evaluation Tier I: 1 Procedure     PT G CodesAlvie Tina [name] Tina [name] 2014/10/10, 2:30 PM  Tina Tina [name], PT,  DPT [phone]

## 2014-09-13 NOTE — Progress Notes (Addendum)
[facility] PROGRESS NOTE  Tina CoopShirley A Allers ZOX:[id number] DOB: 07/13/1930 DOA: 09/07/2014 PCP: Fredirick MaudlinHAWKINS,EDWARD L, MD  Assessment/Plan: Principal Problem:   FTT (failure to thrive) in adult Active Problems:   Peripheral arterial disease   Lumbar stenosis with neurogenic claudication   Generalized weakness   AKI (acute kidney injury)   Dehydration    Left leg radiculopathy/lumbar spinal stenosis/neurogenic claudication Pain improving, lots of questions about the plan,seems confused and not clear about what Dr Jeral FruitBotero is planning to do Requested patient to direct all questions to him Cont  IV Dilaudid, scheduled Robaxin, increase to TID  Physical/ occupational therapy will also be ordered once assessed by neurosurgery,  Dr. Sharman CrateBoteroordered a myelogram , patient was notified about this   Hypertension Continue Norvasc and restart lisinopril,stable   Diabetes Continue Accu-Cheks and start sliding scale insulin  Dyslipidemia continue zetia  and Crestor  Anxiety/  dissatisfied with nursing Started the patient on low-dose of Xanax  Urinary frequency UA, r/o UTI DC IVF      Code Status: full Family Communication: family updated about patient's clinical progress Disposition Plan:  SNF on Monday ?   Brief narrative: Tina [name] is a 81 y.o. female with a past medical history of lumbar stenosis with neurogenic claudication, admitted to the neurosurgery service and in January 2015, admitted for severe back pain associate with urinary incontinence, undergoing bilateral 2,3,4,5 laminectomy and foraminotomies decompression of the thecal sac and foraminotomies L1-L2 and L5-S1. She presents to the emergency department with complaints of worsening back pain associated with left lower extremity pain and weakness. She reports having at least 4 falls in her home over the past week as her lower extremity weakness has progressed. Patient expresses difficulties caring for herself as  extremity weakness and neurological deficits have greatly affected her ability to perform activities of daily living. Initial lab work performed in the emergency room did not show evidence of infection as UA was negative and she presented afebrile. Lumbar x-ray was unremarkable.  Consultants:  Neurosurgery    Procedures:  None  Antibiotics: None  HPI/Subjective:   extremely anxious, lots of questions about the plan  Objective: Filed Vitals:   09/12/14 1715 09/12/14 2150 09/13/14 0221 09/13/14 0619  BP: 162/67 155/56 153/69 168/66  Pulse: 92 80 82 80  Temp: 97.8 F (36.6 C) 98 F (36.7 C) 97 F (36.1 C) 97.7 F (36.5 C)  TempSrc: Oral Oral Oral Oral  Resp: 20 20 18 18   Height:      Weight:      SpO2: 98% 98% 97% 99%   No intake or output data in the 24 hours ending 09/13/14 0919  Exam:  General: No acute respiratory distress Lungs: Clear to auscultation bilaterally without wheezes or crackles Cardiovascular: Regular rate and rhythm without murmur gallop or rub normal S1 and S2 Abdomen: Nontender, nondistended, soft, bowel sounds positive, no rebound, no ascites, no appreciable mass Extremities: No significant cyanosis, clubbing, or edema bilateral lower extremities      Data Reviewed: Basic Metabolic Panel:  Recent Labs Lab 09/07/14 1800 09/08/14 0624 09/12/14 1215  NA 141 142 140  K 4.3 3.9 4.8  CL 110 114* 108  CO2 22 24 23   GLUCOSE 55* 48* 126*  BUN 29* 21 12  CREATININE 1.38* 0.92 0.74  CALCIUM 9.4 8.8 9.1    Liver Function Tests:  Recent Labs Lab 09/12/14 1215  AST 33  ALT 26  ALKPHOS 67  BILITOT 0.6  PROT 6.0  ALBUMIN 3.1*  No results for input(s): LIPASE, AMYLASE in the last 168 hours. No results for input(s): AMMONIA in the last 168 hours.  CBC:  Recent Labs Lab 09/07/14 1800 09/08/14 0624 09/13/14 0606  WBC 10.2 8.0 11.8*  NEUTROABS 6.5  --   --   HGB 12.0 10.8* 12.4  HCT 37.5 34.6* 38.4  MCV 104.2* 105.2*  102.7*  PLT 386 370 407*    Cardiac Enzymes: No results for input(s): CKTOTAL, CKMB, CKMBINDEX, TROPONINI in the last 168 hours. BNP (last 3 results) No results for input(s): BNP in the last 8760 hours.  ProBNP (last 3 results) No results for input(s): PROBNP in the last 8760 hours.    CBG:  Recent Labs Lab 09/12/14 0649 09/12/14 1141 09/12/14 1704 09/12/14 2151/11/24 09/13/14 0644  GLUCAP 137* 125* 206* 140* 131*    No results found for this or any previous visit (from the past 240 hour(s)).   Studies: Dg Lumbar Spine Complete  08/19/2014   CLINICAL DATA:  Mid low back pain with ambulation.  EXAM: LUMBAR SPINE - COMPLETE 4+ VIEW  COMPARISON:  03/04/2014 MRI.  02/20/2014 plain films.  FINDINGS: Minimal S-shaped lumbar spine curvature. Sacroiliac joints are symmetric. Left hip osteoarthritis is moderate to marked. Dense aortic atherosclerosis. Maintenance of vertebral body height. Mild straightening of expected lordosis. Degenerative disc disease is advanced at L4-5, L3-4, and L2-3. Facet arthropathy involves lower lumbar spine.  IMPRESSION: Advanced spondylosis, without acute osseous finding.  Atherosclerosis.  Left hip osteoarthritis.   Electronically Signed   By: Jeronimo Greaves M.D.   On: 08/19/2014 12:06   Mr Lumbar Spine Wo Contrast  09/08/2014   CLINICAL DATA:  Left leg pain. Lower extremity weakness. Previous lumbar decompression. Multiple falls at home in the last week.  EXAM: MRI LUMBAR SPINE WITHOUT CONTRAST  TECHNIQUE: Multiplanar, multisequence MR imaging of the lumbar spine was performed. No intravenous contrast was administered.  COMPARISON:  Radiography 08/19/2014.  MRI 03/04/2014.  FINDINGS: There is mild curvature convex to the left with the apex at L3.  T11-12:  Small disc bulge without neural compression.  T12-L1: Small disc bulge without neural compression.  L1-2:  Normal interspace.  L2-3: Previous posterior decompression. Endplate osteophytes and bulging of the disc.  Mild narrowing of the lateral recesses without gross neural compression. Mild foraminal stenosis bilaterally.  L3-4: Previous posterior decompression. Endplate osteophytes and mild bulging of the disc. Mild stenosis of both neural foramina without gross neural compression.  L4-5: Previous posterior decompression. Endplate osteophytes and shallow protrusion of disc material. Narrowing of the lateral recesses. Foraminal narrowing left more than right.  L5-S1: Previous posterior decompression. Minimal bulging of the disc. Mild facet degeneration. No significant stenosis.  Compared to the previous study, no change is seen since September 2015.  IMPRESSION: No change since September 2015. Posterior decompression at L2-3, L3-4, L4-5 and L5-S1. No compressive central canal stenosis. Narrowing of the lateral recesses and foramina at L4-5 could be symptomatic, but is unchanged since Nov 23, 2013. Other less pronounced degenerative changes as outlined above.   Electronically Signed   By: Paulina Fusi M.D.   On: 09/08/2014 09:35   Ir Fluoro Guide Ndl Plmt / Bx  09/10/2014   CLINICAL DATA:  Lumbosacral spondylosis without myelopathy  FLUOROSCOPY TIME:  36 seconds.  PROCEDURE: CAUDAL EPIDURAL INJECTION  Utilizing a caudal approach, the skin overlying the sacral hiatus was cleansed and anesthetized. A 20 gauge Crawford epidural needle was advanced into the sacral epidural space. Injection of Omnipaque 180 shows a  good epidural pattern with spread up to L5-S1. No vascular opacification is seen.  120 mg of Depo-Medrol mixed with 5 cc of normal saline and 3 cc of 1% Lidocaine were instilled. The procedure was well-tolerated, and the patient was discharged thirty minutes following the injection in good condition.  IMPRESSION: Technically successful caudal epidural injection.   Electronically Signed   By: Jolaine Click M.D.   On: 09/10/2014 15:37    Scheduled Meds: . amLODipine  10 mg Oral Daily  . aspirin EC  81 mg Oral QHS  .  colesevelam  1,875 mg Oral BID WC  . ezetimibe  10 mg Oral Daily  . feeding supplement (ENSURE COMPLETE)  237 mL Oral BID BM  . heparin  5,000 Units Subcutaneous 3 times per day  . insulin aspart  0-9 Units Subcutaneous TID WC  . methocarbamol  500 mg Oral BID  . rosuvastatin  20 mg Oral Daily   Continuous Infusions: . sodium chloride 75 mL/hr at 09/11/14 2054    Principal Problem:   FTT (failure to thrive) in adult Active Problems:   Peripheral arterial disease   Lumbar stenosis with neurogenic claudication   Generalized weakness   AKI (acute kidney injury)   Dehydration    Time spent: 40 minutes   [facility]  [facility] Pager [phone]. If 7PM-7AM, please contact night-coverage at [email or web address], password [facility] 09/13/2014, 9:19 AM  LOS: 6 days

## 2014-09-13 NOTE — Progress Notes (Signed)
Patient ID: Tina [name], female   DOB: 07/29/1930, 81 y.o.   MRN: [id number] Subjective:  The patient is alert and pleasant. She complains that her left knee gives out on her.  Objective: Vital signs in last 24 hours: Temp:  [97 F (36.1 C)-98 F (36.7 C)] 97.7 F (36.5 C) (03/19 0619) Pulse Rate:  [80-92] 80 (03/19 0619) Resp:  [18-20] 18 (03/19 0619) BP: (133-168)/(50-69) 168/66 mmHg (03/19 0619) SpO2:  [97 %-100 %] 99 % (03/19 0619)  Intake/Output from previous day:   Intake/Output this shift:    Physical exam the patient is alert and pleasant. She is moving her lower extremities well.  Lab Results:  Recent Labs  09/13/14 0606  WBC 11.8*  HGB 12.4  HCT 38.4  PLT 407*   BMET  Recent Labs  09/12/14 1215  NA 140  K 4.8  CL 108  CO2 23  GLUCOSE 126*  BUN 12  CREATININE 0.74  CALCIUM 9.1    Studies/Results: No results found.  Assessment/Plan: Left leg weakness: The patient has been scheduled for a lumbar myelogram. I answered all her questions.  LOS: 6 days     Onisha Cedeno D 09/13/2014, 9:57 AM

## 2014-09-13 NOTE — Progress Notes (Signed)
OT Cancellation Note  Patient Details Name: Tina [name] MRN: [id number] DOB: 09/29/1930   Cancelled Treatment:    Reason Eval/Treat Not Completed: Patient not medically ready. Pt is for lumbar myelogram today and per PT, RN indicated that therapy should hold until after this procedure. Acute OT will follow up as available.   Nena JordanMiller, Jakobi Thetford M 09/13/2014, 11:05 AM

## 2014-09-14 LAB — GLUCOSE, CAPILLARY
Glucose-Capillary: 146 mg/dL — ABNORMAL HIGH (ref 70–99)
Glucose-Capillary: 135 mg/dL — ABNORMAL HIGH (ref 70–99)
Glucose-Capillary: 130 mg/dL — ABNORMAL HIGH (ref 70–99)
Glucose-Capillary: 138 mg/dL — ABNORMAL HIGH (ref 70–99)

## 2014-09-14 LAB — URINALYSIS, ROUTINE W REFLEX MICROSCOPIC
BILIRUBIN URINE: NEGATIVE
Glucose, UA: NEGATIVE mg/dL
Hgb urine dipstick: NEGATIVE
KETONES UR: NEGATIVE mg/dL
LEUKOCYTES UA: NEGATIVE
Nitrite: NEGATIVE
PROTEIN: NEGATIVE mg/dL
SPECIFIC GRAVITY, URINE: 1.02 (ref 1.005–1.030)
UROBILINOGEN UA: 0.2 mg/dL (ref 0.0–1.0)
pH: 5 (ref 5.0–8.0)

## 2014-09-14 LAB — COMPREHENSIVE METABOLIC PANEL
ALK PHOS: 72 U/L (ref 39–117)
ALT: 36 U/L — AB (ref 0–35)
AST: 36 U/L (ref 0–37)
Albumin: 3 g/dL — ABNORMAL LOW (ref 3.5–5.2)
Anion gap: 6 (ref 5–15)
BILIRUBIN TOTAL: 0.6 mg/dL (ref 0.3–1.2)
BUN: 15 mg/dL (ref 6–23)
CALCIUM: 9.2 mg/dL (ref 8.4–10.5)
CHLORIDE: 105 mmol/L (ref 96–112)
CO2: 28 mmol/L (ref 19–32)
Creatinine, Ser: 0.8 mg/dL (ref 0.50–1.10)
GFR calc Af Amer: 78 mL/min — ABNORMAL LOW (ref >90)
GFR calc non Af Amer: 67 mL/min — ABNORMAL LOW (ref >90)
GLUCOSE: 196 mg/dL — AB (ref 70–99)
POTASSIUM: 4 mmol/L (ref 3.5–5.1)
SODIUM: 139 mmol/L (ref 135–145)
TOTAL PROTEIN: 6.1 g/dL (ref 6.0–8.3)

## 2014-09-14 NOTE — Progress Notes (Signed)
[facility] PROGRESS NOTE  Tina CoopShirley A [name] WUJ:[id number] DOB: 07/03/1930 DOA: 09/07/2014 PCP: Fredirick MaudlinHAWKINS,EDWARD L, MD  Assessment/Plan: Principal Problem:   FTT (failure to thrive) in adult Active Problems:   Peripheral arterial disease   Lumbar stenosis with neurogenic claudication   Generalized weakness   AKI (acute kidney injury)   Dehydration    Left leg radiculopathy/lumbar spinal stenosis/neurogenic claudication Pain improving, Awaiting a myelogram to assess the lumbar spine Requested patient to direct all questions regarding plan of care to neurosurgery Patient currently on OxyIR, Percocet, Robaxin, IV Dilaudid Physical/ occupational therapy recommended SNF  Dr. Sharman CrateBoteroordered a myelogram , this will be done tomorrow Social work consult for SNF Can steroids help with the patient's acute symptoms??   Hypertension Continue Norvasc   Diabetes Continue Accu-Cheks and start sliding scale insulin  Dyslipidemia continue zetia  and Crestor  Anxiety Continue when necessary Xanax  Urinary frequency UA is still pending DC IVF      Code Status: full Family Communication: family updated about patient's clinical progress Disposition Plan:  SNF on Monday ?   Brief narrative: Tina CoopShirley A Degraffenreid is a 81 y.o. female with a past medical history of lumbar stenosis with neurogenic claudication, admitted to the neurosurgery service and in January 2015, admitted for severe back pain associate with urinary incontinence, undergoing bilateral 2,3,4,5 laminectomy and foraminotomies decompression of the thecal sac and foraminotomies L1-L2 and L5-S1. She presents to the emergency department with complaints of worsening back pain associated with left lower extremity pain and weakness. She reports having at least 4 falls in her home over the past week as her lower extremity weakness has progressed. Patient expresses difficulties caring for herself as extremity weakness and neurological  deficits have greatly affected her ability to perform activities of daily living. Initial lab work performed in the emergency room did not show evidence of infection as UA was negative and she presented afebrile. Lumbar x-ray was unremarkable.  Consultants:  Neurosurgery    Procedures:  None  Antibiotics: None  HPI/Subjective: Complaining of pain in the left lower extremity         Objective: Filed Vitals:   09/13/14 2137 09/14/14 0223 09/14/14 0508 09/14/14 1017  BP: 136/49 134/54 156/59 143/53  Pulse: 84 75 78 75  Temp: 98 F (36.7 C) 97.8 F (36.6 C) 97.6 F (36.4 C) 98.3 F (36.8 C)  TempSrc: Oral Oral Oral Oral  Resp: 18 18 18 18   Height:      Weight:      SpO2: 97% 96% 98% 100%   No intake or output data in the 24 hours ending 09/14/14 1250  Exam:  General: No acute respiratory distress Lungs: Clear to auscultation bilaterally without wheezes or crackles Cardiovascular: Regular rate and rhythm without murmur gallop or rub normal S1 and S2 Abdomen: Nontender, nondistended, soft, bowel sounds positive, no rebound, no ascites, no appreciable mass Extremities: No significant cyanosis, clubbing, or edema bilateral lower extremities      Data Reviewed: Basic Metabolic Panel:  Recent Labs Lab 09/07/14 1800 09/08/14 0624 09/12/14 1215 09/14/14 0805  NA 141 142 140 139  K 4.3 3.9 4.8 4.0  CL 110 114* 108 105  CO2 22 24 23 28   GLUCOSE 55* 48* 126* 196*  BUN 29* 21 12 15   CREATININE 1.38* 0.92 0.74 0.80  CALCIUM 9.4 8.8 9.1 9.2    Liver Function Tests:  Recent Labs Lab 09/12/14 1215 09/14/14 0805  AST 33 36  ALT 26 36*  ALKPHOS 67  72  BILITOT 0.6 0.6  PROT 6.0 6.1  ALBUMIN 3.1* 3.0*   No results for input(s): LIPASE, AMYLASE in the last 168 hours. No results for input(s): AMMONIA in the last 168 hours.  CBC:  Recent Labs Lab 09/07/14 1800 09/08/14 0624 09/13/14 0606  WBC 10.2 8.0 11.8*  NEUTROABS 6.5  --   --   HGB  12.0 10.8* 12.4  HCT 37.5 34.6* 38.4  MCV 104.2* 105.2* 102.7*  PLT 386 370 407*    Cardiac Enzymes: No results for input(s): CKTOTAL, CKMB, CKMBINDEX, TROPONINI in the last 168 hours. BNP (last 3 results) No results for input(s): BNP in the last 8760 hours.  ProBNP (last 3 results) No results for input(s): PROBNP in the last 8760 hours.    CBG:  Recent Labs Lab 09/13/14 1148 09/13/14 1640 09/13/14 11-09-54 09/14/14 0712 09/14/14 1140  GLUCAP 143* 150* 159* 146* 135*    No results found for this or any previous visit (from the past 240 hour(s)).   Studies: Dg Lumbar Spine Complete  08/19/2014   CLINICAL DATA:  Mid low back pain with ambulation.  EXAM: LUMBAR SPINE - COMPLETE 4+ VIEW  COMPARISON:  03/04/2014 MRI.  02/20/2014 plain films.  FINDINGS: Minimal S-shaped lumbar spine curvature. Sacroiliac joints are symmetric. Left hip osteoarthritis is moderate to marked. Dense aortic atherosclerosis. Maintenance of vertebral body height. Mild straightening of expected lordosis. Degenerative disc disease is advanced at L4-5, L3-4, and L2-3. Facet arthropathy involves lower lumbar spine.  IMPRESSION: Advanced spondylosis, without acute osseous finding.  Atherosclerosis.  Left hip osteoarthritis.   Electronically Signed   By: Jeronimo Greaves M.D.   On: 08/19/2014 12:06   Mr Lumbar Spine Wo Contrast  09/08/2014   CLINICAL DATA:  Left leg pain. Lower extremity weakness. Previous lumbar decompression. Multiple falls at home in the last week.  EXAM: MRI LUMBAR SPINE WITHOUT CONTRAST  TECHNIQUE: Multiplanar, multisequence MR imaging of the lumbar spine was performed. No intravenous contrast was administered.  COMPARISON:  Radiography 08/19/2014.  MRI 03/04/2014.  FINDINGS: There is mild curvature convex to the left with the apex at L3.  T11-12:  Small disc bulge without neural compression.  T12-L1: Small disc bulge without neural compression.  L1-2:  Normal interspace.  L2-3: Previous posterior  decompression. Endplate osteophytes and bulging of the disc. Mild narrowing of the lateral recesses without gross neural compression. Mild foraminal stenosis bilaterally.  L3-4: Previous posterior decompression. Endplate osteophytes and mild bulging of the disc. Mild stenosis of both neural foramina without gross neural compression.  L4-5: Previous posterior decompression. Endplate osteophytes and shallow protrusion of disc material. Narrowing of the lateral recesses. Foraminal narrowing left more than right.  L5-S1: Previous posterior decompression. Minimal bulging of the disc. Mild facet degeneration. No significant stenosis.  Compared to the previous study, no change is seen since September 2015.  IMPRESSION: No change since September 2015. Posterior decompression at L2-3, L3-4, L4-5 and L5-S1. No compressive central canal stenosis. Narrowing of the lateral recesses and foramina at L4-5 could be symptomatic, but is unchanged since 2013/11/08. Other less pronounced degenerative changes as outlined above.   Electronically Signed   By: Paulina Fusi M.D.   On: 09/08/2014 09:35   Ir Fluoro Guide Ndl Plmt / Bx  09/10/2014   CLINICAL DATA:  Lumbosacral spondylosis without myelopathy  FLUOROSCOPY TIME:  36 seconds.  PROCEDURE: CAUDAL EPIDURAL INJECTION  Utilizing a caudal approach, the skin overlying the sacral hiatus was cleansed and anesthetized. A 20 gauge Crawford  epidural needle was advanced into the sacral epidural space. Injection of Omnipaque 180 shows a good epidural pattern with spread up to L5-S1. No vascular opacification is seen.  120 mg of Depo-Medrol mixed with 5 cc of normal saline and 3 cc of 1% Lidocaine were instilled. The procedure was well-tolerated, and the patient was discharged thirty minutes following the injection in good condition.  IMPRESSION: Technically successful caudal epidural injection.   Electronically Signed   By: Jolaine Click M.D.   On: 09/10/2014 15:37    Scheduled Meds: .  amLODipine  10 mg Oral Daily  . aspirin EC  81 mg Oral QHS  . colesevelam  1,875 mg Oral BID WC  . ezetimibe  10 mg Oral Daily  . feeding supplement (ENSURE COMPLETE)  237 mL Oral BID BM  . heparin  5,000 Units Subcutaneous 3 times per day  . insulin aspart  0-9 Units Subcutaneous TID WC  . methocarbamol  500 mg Oral TID  . rosuvastatin  20 mg Oral Daily   Continuous Infusions:    Principal Problem:   FTT (failure to thrive) in adult Active Problems:   Peripheral arterial disease   Lumbar stenosis with neurogenic claudication   Generalized weakness   AKI (acute kidney injury)   Dehydration    Time spent: 40 minutes   [facility]  [facility] Pager [phone]. If 7PM-7AM, please contact night-coverage at [email or web address], password [facility] 09/14/2014, 12:50 PM  LOS: 7 days

## 2014-09-14 NOTE — Progress Notes (Signed)
Patient ID: Tina [name], female   DOB: 10/31/1930, 81 y.o.   MRN: [id number] BP 143/53 mmHg  Pulse 75  Temp(Src) 98.3 F (36.8 C) (Oral)  Resp 18  Ht 5\' 3"  (1.6 m)  Wt 101.651 kg (224 lb 1.6 oz)  BMI 39.71 kg/m2  SpO2 100% Alert and oriented x 4 Awaiting a myelogram to assess the lumbar spine Complaining of pain in the left lower extremity

## 2014-09-14 NOTE — Evaluation (Signed)
Occupational Therapy Evaluation Patient Details Name: Tina [name] MRN: [id number] DOB: 03/30/1931 Today's Date: 09/14/2014    History of Present Illness Patient is a 81 y/o female with PMH of lumbar stenosis with neurogenic claudication, admitted in 06/2013 for severe back pain associated with UI s/p bil 2,3,4,5 laminectomy and foraminotomies decompression of the thecal sac and foraminotomies L1-L2 and L5-S1. Pt presents to the ED w/ complaints of worsening back pain associated with LLE pain and weakness. S/p caudal epidural injection 3/16. PMH of anxiety, PVD, DM, HTN and Hep A. Planned for lumbar myelogram 3/21.     Clinical Impression   PTA pt lived at home alone and reports independence with use of RW for ADLs. Pt also reports she was driving. Pt confabulating during today's session and difficult to determine accuracy of provided information. Pt is extremely anxious and self-limiting. Despite reports of 10/10 pain, pt was able to get to EOB and stand with min A without evidence of grimacing or guarding, but did not achieve full standing and demanded to return to bed. Pt will benefit from acute OT and from SNF at d/c to promote independence to facilitate return home. Discussed with pt that she may need to consider ALF long term.     Follow Up Recommendations  SNF;Supervision/Assistance - 24 hour    Equipment Recommendations  None recommended by OT    Recommendations for Other Services       Precautions / Restrictions Precautions Precautions: Fall Precaution Comments: extremely anxious; confabulatory Restrictions Weight Bearing Restrictions: No      Mobility Bed Mobility Overal bed mobility: Needs Assistance Bed Mobility: Rolling;Sidelying to Sit;Sit to Sidelying Rolling: Min guard Sidelying to sit: Min assist;HOB elevated     Sit to sidelying: Min assist General bed mobility comments: Pt was able to bring LEs to EOB and scoot bottom forward, but required assistance to  pull to sitting with hand held assist. Pt is extremely anxious, fearful, and self-limiting  Transfers Overall transfer level: Needs assistance Equipment used: Pushed w/c Transfers: Sit to/from Stand Sit to Stand: Min assist         General transfer comment: Min A to stand from EOB, but pt was self limiting and chose to only stand half way. Pt reports 10/10 pain but not giving any physical indication of pain (no grimacing, no guarding).          ADL Overall ADL's : Needs assistance/impaired Eating/Feeding: Independent;Bed level   Grooming: Oral care;Set up;Bed level   Upper Body Bathing: Minimal assitance;Sitting   Lower Body Bathing: Total assistance;+2 for physical assistance;Sit to/from stand   Upper Body Dressing : Minimal assistance;Sitting   Lower Body Dressing: Total assistance;+2 for physical assistance;Sit to/from stand   Toilet Transfer: Minimal assistance Toilet Transfer Details (indicate cue type and reason): sit<>stand using locked w/c in front of pt. Per pt "this is how I do it at home" Toileting- ArchitectClothing Manipulation and Hygiene: Total assistance;Sit to/from stand         General ADL Comments: Pt is extremely self-limiting and confabulatory during session. Despite reports of 10/10 pain and inability to move LLE in bed, pt was able to get to EOB with min A hand held to pull to sitting. Pt also required only Min A to power up from EOB with locked wheelchair in front of her (she reports "this is how I do it at home"). Pt able to stand with min A but chose not to achieve full standing due to self-limiting behaviors and  demanded to return to bed. At end of session, pt requested that OT close window blinds and OT and RN told pt that the blinds should be open today to assist in orienting pt and to promote arousal.      Vision Vision Assessment?: No apparent visual deficits          Pertinent Vitals/Pain Pain Assessment: 0-10 Pain Score: 10-Worst pain ever Pain  Location: LLE with any movement Pain Descriptors / Indicators: Radiating;Sharp;Shooting ("electricity") Pain Intervention(s): Limited activity within patient's tolerance;Monitored during session;Repositioned;RN gave pain meds during session;Relaxation     Hand Dominance Right   Extremity/Trunk Assessment Upper Extremity Assessment Upper Extremity Assessment: Generalized weakness   Lower Extremity Assessment Lower Extremity Assessment: Defer to PT evaluation   Cervical / Trunk Assessment Cervical / Trunk Assessment: Normal;Other exceptions Cervical / Trunk Exceptions: body habitus   Communication Communication Communication: No difficulties   Cognition Arousal/Alertness: Awake/alert Behavior During Therapy: Anxious (extremely) Overall Cognitive Status: No family/caregiver present to determine baseline cognitive functioning Area of Impairment: Problem solving             Problem Solving: Decreased initiation;Difficulty sequencing General Comments: Repeatedly asking throughout session "Now what am I doing?" Pt confabulating during session reporting things such as: "my cat's name is Jewish, you wouldn't know it. All [place] Jews name their cat Scootchie because they can't think of any other names." Pt also reporting that during her last rehab stay at SNF, rehab staff demanded that she do inappropriate things in order to "pass therapy." Per RN, pt also reported that therapy came yesterday at 4:30am demanding that she get up even though pt reports she cannot stand              Home Living Family/patient expects to be discharged to:: Skilled nursing facility Living Arrangements: Alone                               Additional Comments: Pt lives alone in 2 level home with shower on second level. Reports increased difficulty walking and getting around the last 3-4 weeks with increased falls.      Prior Functioning/Environment Level of Independence: Needs assistance  Gait  / Transfers Assistance Needed: Uses RW for ambulation. Reports driving.          OT Diagnosis: Generalized weakness;Acute pain;Cognitive deficits   OT Problem List: Decreased strength;Decreased activity tolerance;Impaired balance (sitting and/or standing);Decreased knowledge of use of DME or AE;Decreased cognition;Decreased knowledge of precautions;Pain   OT Treatment/Interventions: Self-care/ADL training;Therapeutic exercise;Energy conservation;DME and/or AE instruction;Therapeutic activities;Cognitive remediation/compensation;Patient/family education;Balance training    OT Goals(Current goals can be found in the care plan section) Acute Rehab OT Goals Patient Stated Goal: to be able to walk and do my shopping OT Goal Formulation: With patient Time For Goal Achievement: 09/28/14 Potential to Achieve Goals: Good ADL Goals Pt Will Perform Grooming: with set-up;sitting Pt Will Perform Upper Body Bathing: with set-up;sitting Pt Will Perform Upper Body Dressing: with set-up;sitting Pt Will Transfer to Toilet: with supervision;stand pivot transfer;bedside commode Additional ADL Goal #1: Pt will perform bed mobility using log roll technique with Supervision to prepare for ADLs.   OT Frequency: Min 1X/week   Barriers to D/C: Decreased caregiver support       End of Session Equipment Utilized During Treatment: Other (comment) (w/c) Nurse Communication: Mobility status  Activity Tolerance: Patient limited by pain Patient left: in bed;with call bell/phone within reach;with bed alarm set  Time: 1610-9604 OT Time Calculation (min): 25 min Charges:  OT General Charges $OT Visit: 1 Procedure OT Evaluation $Initial OT Evaluation Tier I: 1 Procedure OT Treatments $Self Care/Home Management : 8-22 mins G-Codes:    Rae Lips 2014/09/18, 10:22 AM  Carney Living, OTR/L Occupational Therapist [phone] (pager)

## 2014-09-15 ENCOUNTER — Inpatient Hospital Stay (HOSPITAL_COMMUNITY): Payer: Medicare Other

## 2014-09-15 LAB — GLUCOSE, CAPILLARY
GLUCOSE-CAPILLARY: 146 mg/dL — AB (ref 70–99)
GLUCOSE-CAPILLARY: 145 mg/dL — AB (ref 70–99)
Glucose-Capillary: 133 mg/dL — ABNORMAL HIGH (ref 70–99)
Glucose-Capillary: 195 mg/dL — ABNORMAL HIGH (ref 70–99)

## 2014-09-15 MED ORDER — POLYETHYLENE GLYCOL 3350 17 G PO PACK
17.0000 g | PACK | Freq: Every day | ORAL | Status: DC
  Administered 2014-09-15 – 2014-09-20 (×5): 17 g via ORAL
  Filled 2014-09-15 (×5): qty 1

## 2014-09-15 MED ORDER — IOHEXOL 180 MG/ML  SOLN
20.0000 mL | Freq: Once | INTRAMUSCULAR | Status: AC | PRN
  Administered 2014-09-15: 20 mL via INTRATHECAL

## 2014-09-15 NOTE — Clinical Social Work Psychosocial (Signed)
Clinical Social Work Department BRIEF PSYCHOSOCIAL ASSESSMENT 09/15/2014  Patient:  Tina [name], Tina [name]     Account Number:  [id number]     [place] date:  09/07/2014  Clinical Social Worker:  Marciano Sequin  Date/Time:  09/15/2014 01:39 PM  Referred by:  RN  Date Referred:  09/15/2014 Referred for  SNF Placement   Other Referral:   Interview type:  Patient Other interview type:    PSYCHOSOCIAL DATA Living Status:  ALONE Admitted from facility:   Level of care:   Primary support name:  Korpi,Andrea Primary support relationship to patient:  CHILD, ADULT Degree of support available:   Strong Support    CURRENT CONCERNS Current Concerns  None Noted   Other Concerns:    SOCIAL WORK ASSESSMENT / PLAN CSW met the pt at the bedside.  CSW introduced self and purpose of the visit. CSW discussed clinical recommendation for SNF rehab. CSW inquired about the geographical location in which the pt would like to receive rehab from. Pt report wanting to be close to her family in [place]. CSW explained the SNF rehab process to the pt. CSW and pt discussed insurance and its relation to SNF rehab. CSW answered all questions in which the pt inquired about. CSW provided pt with contact information for further questions. CSW will continue to follow this pt and assist with discharge as needed.   Assessment/plan status:  Psychosocial Support/Ongoing Assessment of Needs Other assessment/ plan:   Information/referral to community resources:     PATIENT'S/FAMILY'S RESPONSE TO CURRENT DIAGNOSE: Pt presented with a irritable mood. Pt was cursing and yelling at her daughter. After talking to the pt for several minutes CSW was able to get the pt to calm down and redirect her. Pt reported aside from talking to her daughter she is well. Pt provided great insight into her current diagnose. Pt expressed gratitude for the care she received by the clinical team.   PATIENT'S/FAMILY'S RESPONSE TO PLAN OF  CARE: Pt agreeable to SNF placemet.    [place], MSW, [place]

## 2014-09-15 NOTE — Progress Notes (Signed)
NUTRITION FOLLOW UP  Intervention:   Continue Ensure Enlive po BID, each supplement provides 350 kcal and 20 grams of protein Encourage PO intake  Nutrition Dx:   Predicted suboptimal intake related to failure to thrive as evidenced by dehydration on admission and diet hx; ongoing  Goal:   Pt to meet >/= 90% of their estimated nutrition needs; being met  Monitor:   PO intake, supplement intake, weight trend, labs  Assessment:   Pt reports that she still doesn't feel like eating due to being restricted to the bed. She reports eating 50% of most meals. She states she likes Ensure supplements and has been drinking 2 bottles daily. Per nursing notes, pt is consuming 50-100% of meals. No new weights.   Labs reviewed.   Height: Ht Readings from Last 1 Encounters:  09/07/14 5' 3"  (1.6 m)    Weight Status:   Wt Readings from Last 1 Encounters:  09/07/14 224 lb 1.6 oz (101.651 kg)    Re-estimated needs:  Kcal: 1400-1600 Protein: 80-90 grams Fluid: >1500 ml daily   Skin: intact  Diet Order: Diet heart healthy/carb modified  No intake or output data in the 24 hours ending 09/15/14 1349  Last BM: 3/11  Labs:   Recent Labs Lab 09/12/14 1215 09/14/14 0805  NA 140 139  K 4.8 4.0  CL 108 105  CO2 23 28  BUN 12 15  CREATININE 0.74 0.80  CALCIUM 9.1 9.2  GLUCOSE 126* 196*    CBG (last 3)   Recent Labs  09/14/14 2146 09/15/14 0704 09/15/14 1208  GLUCAP 138* 133* 146*    Scheduled Meds: . amLODipine  10 mg Oral Daily  . aspirin EC  81 mg Oral QHS  . colesevelam  1,875 mg Oral BID WC  . ezetimibe  10 mg Oral Daily  . feeding supplement (ENSURE COMPLETE)  237 mL Oral BID BM  . heparin  5,000 Units Subcutaneous 3 times per day  . insulin aspart  0-9 Units Subcutaneous TID WC  . methocarbamol  500 mg Oral TID  . rosuvastatin  20 mg Oral Daily    Continuous Infusions:   Pryor Ochoa RD, LDN Inpatient Clinical Dietitian Pager: [phone] After Hours Pager:  [phone]

## 2014-09-15 NOTE — Progress Notes (Signed)
Patient ID: Tina [name], female   DOB: 12/16/1930, 81 y.o.   MRN: [id number] Stable, c/o lumbar pain with left leg radiation. Lumbar myelogram pending

## 2014-09-15 NOTE — Procedures (Signed)
LP L5-S1 midline with 22 gauge needle.  18cc omnipaque 180 instilled.  Pt to CT in good condition.

## 2014-09-15 NOTE — Clinical Social Work Placement (Addendum)
Clinical Social Work Department CLINICAL SOCIAL WORK PLACEMENT NOTE 09/15/2014  Patient:  Tina [name],[name]  Account Number:  [id number] Admit date:  09/07/2014  Clinical Social Worker:  Ashok CordiaYSHEKA Marques Ericson, LCSWA  Date/time:  09/15/2014 01:53 PM  Clinical Social Work is seeking post-discharge placement for this patient at the following level of care:   SKILLED NURSING   (*CSW will update this form in Epic as items are completed)   09/15/2014  Patient/family provided with [facility] of Clinical [facility] Work's list of facilities offering this level of care within the geographic area requested by the patient (or if unable, by the patient's family).  09/15/2014  Patient/family informed of their freedom to choose among providers that offer the needed level of care, that participate in Medicare, Medicaid or managed care program needed by the patient, have an available bed and are willing to accept the patient.  09/15/2014  Patient/family informed of [facility]' ownership interest in [facility], as well as of the fact that they are under no obligation to receive care at this facility.  PASARR submitted to EDS on  PASARR number received on   FL2 transmitted to all facilities in geographic area requested by pt/family on  09/15/2014 FL2 transmitted to all facilities within larger geographic area on 09/15/2014  Patient informed that his/her managed care company has contracts with or will negotiate with  certain facilities, including the following:     Patient/family informed of bed offers received:  09/22/2014 Patient chooses bed at [facility] Physician recommends and patient chooses bed at    Patient to be transferred to [facility] on  09/22/2014 Patient to be transferred to facility by [facility]  Patient and family notified of transfer on 09/22/2014 Name of family member notified:  CSW left voice message for pt's daughter [name] [phone].   The following  physician request were entered in Epic:   Additional Comments: The Pt has a existing PASSAR   Cedar Ditullio, MSW, LCSWA [phone]

## 2014-09-15 NOTE — Progress Notes (Signed)
[facility] PROGRESS NOTE  Tina [name] DOB: 09/23/1930 DOA: 09/07/2014 PCP: Tina MaudlinHAWKINS,EDWARD L, MD  Assessment/Plan: Principal Problem:   FTT (failure to thrive) in adult Active Problems:   Peripheral arterial disease   Lumbar stenosis with neurogenic claudication   Generalized weakness   AKI (acute kidney injury)   Dehydration    Left leg radiculopathy/lumbar spinal stenosis/neurogenic claudication Pain improving, Awaiting on myelogram to assess the lumbar spine Requested patient to direct all questions regarding plan of care to neurosurgery, as patient repeatedly will ask the same questions about the plan from a neurosurgical standpoint Patient currently on OxyIR, Percocet, Robaxin, IV Dilaudid Physical/ occupational therapy recommended SNF which the patient refuses  Dr. Sharman CrateBoteroordered a myelogram , this will be done 3/21 Can steroids help with the patient's acute symptoms??   Hypertension Continue Norvasc   Diabetes Continue Accu-Cheks and start sliding scale insulin  Dyslipidemia continue zetia  and Crestor  Anxiety Continue when necessary Xanax  Urinary frequency UA negative DC IVF      Code Status: full Family Communication: family updated about patient's clinical progress Disposition Plan:  SNF vs home on Monday ?   Brief narrative: Tina [name] is a 81 y.o. female with a past medical history of lumbar stenosis with neurogenic claudication, admitted to the neurosurgery service and in January 2015, admitted for severe back pain associate with urinary incontinence, undergoing bilateral 2,3,4,5 laminectomy and foraminotomies decompression of the thecal sac and foraminotomies L1-L2 and L5-S1. She presents to the emergency department with complaints of worsening back pain associated with left lower extremity pain and weakness. She reports having at least 4 falls in her home over the past week as her lower extremity weakness has  progressed. Patient expresses difficulties caring for herself as extremity weakness and neurological deficits have greatly affected her ability to perform activities of daily living. Initial lab work performed in the emergency room did not show evidence of infection as UA was negative and she presented afebrile. Lumbar x-ray was unremarkable.  Consultants:  Neurosurgery    Procedures:  None  Antibiotics: None  HPI/Subjective: Laying down comfortably in bed, relaxed, looking for her reading glasses          Objective: Filed Vitals:   09/15/14 0249 09/15/14 0654 09/15/14 1022 09/15/14 1048  BP: 147/63 139/73 150/59 108/65  Pulse: 80 74 71 104  Temp: 97.8 F (36.6 C) 97.9 F (36.6 C) 98.3 F (36.8 C) 98.1 F (36.7 C)  TempSrc: Oral Oral Oral Oral  Resp: 18 18 18 18   Height:      Weight:      SpO2: 96% 98% 99% 95%   No intake or output data in the 24 hours ending 09/15/14 1116  Exam:  General: No acute respiratory distress Lungs: Clear to auscultation bilaterally without wheezes or crackles Cardiovascular: Regular rate and rhythm without murmur gallop or rub normal S1 and S2 Abdomen: Nontender, nondistended, soft, bowel sounds positive, no rebound, no ascites, no appreciable mass Extremities: No significant cyanosis, clubbing, or edema bilateral lower extremities      Data Reviewed: Basic Metabolic Panel:  Recent Labs Lab 09/12/14 1215 09/14/14 0805  NA 140 139  K 4.8 4.0  CL 108 105  CO2 23 28  GLUCOSE 126* 196*  BUN 12 15  CREATININE 0.74 0.80  CALCIUM 9.1 9.2    Liver Function Tests:  Recent Labs Lab 09/12/14 1215 09/14/14 0805  AST 33 36  ALT 26 36*  ALKPHOS 67 72  BILITOT 0.6 0.6  PROT 6.0 6.1  ALBUMIN 3.1* 3.0*   No results for input(s): LIPASE, AMYLASE in the last 168 hours. No results for input(s): AMMONIA in the last 168 hours.  CBC:  Recent Labs Lab 09/13/14 0606  WBC 11.8*  HGB 12.4  HCT 38.4  MCV 102.7*  PLT  407*    Cardiac Enzymes: No results for input(s): CKTOTAL, CKMB, CKMBINDEX, TROPONINI in the last 168 hours. BNP (last 3 results) No results for input(s): BNP in the last 8760 hours.  ProBNP (last 3 results) No results for input(s): PROBNP in the last 8760 hours.    CBG:  Recent Labs Lab 09/14/14 0712 09/14/14 1140 09/14/14 1645 09/14/14 Nov 15, 2144 09/15/14 0704  GLUCAP 146* 135* 130* 138* 133*    No results found for this or any previous visit (from the past 240 hour(s)).   Studies: Dg Lumbar Spine Complete  08/19/2014   CLINICAL DATA:  Mid low back pain with ambulation.  EXAM: LUMBAR SPINE - COMPLETE 4+ VIEW  COMPARISON:  03/04/2014 MRI.  02/20/2014 plain films.  FINDINGS: Minimal S-shaped lumbar spine curvature. Sacroiliac joints are symmetric. Left hip osteoarthritis is moderate to marked. Dense aortic atherosclerosis. Maintenance of vertebral body height. Mild straightening of expected lordosis. Degenerative disc disease is advanced at L4-5, L3-4, and L2-3. Facet arthropathy involves lower lumbar spine.  IMPRESSION: Advanced spondylosis, without acute osseous finding.  Atherosclerosis.  Left hip osteoarthritis.   Electronically Signed   By: Jeronimo Greaves M.D.   On: 08/19/2014 12:06   Mr Lumbar Spine Wo Contrast  09/08/2014   CLINICAL DATA:  Left leg pain. Lower extremity weakness. Previous lumbar decompression. Multiple falls at home in the last week.  EXAM: MRI LUMBAR SPINE WITHOUT CONTRAST  TECHNIQUE: Multiplanar, multisequence MR imaging of the lumbar spine was performed. No intravenous contrast was administered.  COMPARISON:  Radiography 08/19/2014.  MRI 03/04/2014.  FINDINGS: There is mild curvature convex to the left with the apex at L3.  T11-12:  Small disc bulge without neural compression.  T12-L1: Small disc bulge without neural compression.  L1-2:  Normal interspace.  L2-3: Previous posterior decompression. Endplate osteophytes and bulging of the disc. Mild narrowing of the  lateral recesses without gross neural compression. Mild foraminal stenosis bilaterally.  L3-4: Previous posterior decompression. Endplate osteophytes and mild bulging of the disc. Mild stenosis of both neural foramina without gross neural compression.  L4-5: Previous posterior decompression. Endplate osteophytes and shallow protrusion of disc material. Narrowing of the lateral recesses. Foraminal narrowing left more than right.  L5-S1: Previous posterior decompression. Minimal bulging of the disc. Mild facet degeneration. No significant stenosis.  Compared to the previous study, no change is seen since September 2015.  IMPRESSION: No change since September 2015. Posterior decompression at L2-3, L3-4, L4-5 and L5-S1. No compressive central canal stenosis. Narrowing of the lateral recesses and foramina at L4-5 could be symptomatic, but is unchanged since 11/15/13. Other less pronounced degenerative changes as outlined above.   Electronically Signed   By: Paulina Fusi M.D.   On: 09/08/2014 09:35   Ir Fluoro Guide Ndl Plmt / Bx  09/10/2014   CLINICAL DATA:  Lumbosacral spondylosis without myelopathy  FLUOROSCOPY TIME:  36 seconds.  PROCEDURE: CAUDAL EPIDURAL INJECTION  Utilizing a caudal approach, the skin overlying the sacral hiatus was cleansed and anesthetized. A 20 gauge Crawford epidural needle was advanced into the sacral epidural space. Injection of Omnipaque 180 shows a good epidural pattern with spread up to L5-S1. No vascular  opacification is seen.  120 mg of Depo-Medrol mixed with 5 cc of normal saline and 3 cc of 1% Lidocaine were instilled. The procedure was well-tolerated, and the patient was discharged thirty minutes following the injection in good condition.  IMPRESSION: Technically successful caudal epidural injection.   Electronically Signed   By: Jolaine Click M.D.   On: 09/10/2014 15:37    Scheduled Meds: . amLODipine  10 mg Oral Daily  . aspirin EC  81 mg Oral QHS  . colesevelam  1,875 mg Oral  BID WC  . ezetimibe  10 mg Oral Daily  . feeding supplement (ENSURE COMPLETE)  237 mL Oral BID BM  . heparin  5,000 Units Subcutaneous 3 times per day  . insulin aspart  0-9 Units Subcutaneous TID WC  . methocarbamol  500 mg Oral TID  . rosuvastatin  20 mg Oral Daily   Continuous Infusions:    Principal Problem:   FTT (failure to thrive) in adult Active Problems:   Peripheral arterial disease   Lumbar stenosis with neurogenic claudication   Generalized weakness   AKI (acute kidney injury)   Dehydration    Time spent: 40 minutes   [facility]  [facility] Pager [phone]. If 7PM-7AM, please contact night-coverage at [email or web address], password [facility] 09/15/2014, 11:16 AM  LOS: 8 days

## 2014-09-16 ENCOUNTER — Other Ambulatory Visit: Payer: Self-pay | Admitting: Neurosurgery

## 2014-09-16 LAB — GLUCOSE, CAPILLARY
Glucose-Capillary: 157 mg/dL — ABNORMAL HIGH (ref 70–99)
Glucose-Capillary: 217 mg/dL — ABNORMAL HIGH (ref 70–99)
Glucose-Capillary: 215 mg/dL — ABNORMAL HIGH (ref 70–99)
Glucose-Capillary: 172 mg/dL — ABNORMAL HIGH (ref 70–99)

## 2014-09-16 LAB — APTT: aPTT: 31 seconds (ref 24–37)

## 2014-09-16 NOTE — Progress Notes (Signed)
Received & count verified from pharmacy: Oxycodone 10:325 (76) tabs returned to daughter Hulda Marinndrea Korpi.  Copy placed in shadow chart.  Extra Strength Tylenol (75) tabs also returned to daughter.

## 2014-09-16 NOTE — Progress Notes (Signed)
[facility] PROGRESS NOTE  Tina [name] ZOX:[id number] DOB: [birth year] DOA: 09/07/2014 PCP: Fredirick Maudlin, MD  Assessment/Plan: Principal Problem:   FTT (failure to thrive) in adult Active Problems:   Peripheral arterial disease   Lumbar stenosis with neurogenic claudication   Generalized weakness   AKI (acute kidney injury)   Dehydration    Left leg radiculopathy/lumbar spinal stenosis/neurogenic claudication Pain improving, Awaiting on myelogram to assess the lumbar spine Requested patient to direct all questions regarding plan of care to neurosurgery, as patient repeatedly will ask the same questions about the plan from a neurosurgical standpoint Patient currently on OxyIR, Percocet, Robaxin, IV Dilaudid Physical/ occupational therapy recommended SNF which the patient refuses  Dr. Sharman Crate a myelogram , on 3/21 showed Severe left foraminal stenosis at L4-5 because of encroachment by bony material. Left L4 nerve root would likely be compressed Awaiting further recommendations from neurosurgery Can steroids help with the patient's acute symptoms??   Hypertension Continue Norvasc   Diabetes Continue Accu-Cheks and start sliding scale insulin  Dyslipidemia continue zetia  and Crestor  Anxiety Continue when necessary Xanax  Urinary frequency UA negative DC IVF      Code Status: full Family Communication: family updated about patient's clinical progress Disposition Plan:  SNF vs home once neurosurgery has made their final recommendations   Brief narrative: Tina [name] is a 81 y.o. female with a past medical history of lumbar stenosis with neurogenic claudication, admitted to the neurosurgery service and in January 2015, admitted for severe back pain associate with urinary incontinence, undergoing bilateral 2,3,4,5 laminectomy and foraminotomies decompression of the thecal sac and foraminotomies L1-L2 and L5-S1. She presents to the emergency  department with complaints of worsening back pain associated with left lower extremity pain and weakness. She reports having at least 4 falls in her home over the past week as her lower extremity weakness has progressed. Patient expresses difficulties caring for herself as extremity weakness and neurological deficits have greatly affected her ability to perform activities of daily living. Initial lab work performed in the emergency room did not show evidence of infection as UA was negative and she presented afebrile. Lumbar x-ray was unremarkable.  Consultants:  Neurosurgery    Procedures:  None  Antibiotics: None  HPI/Subjective: No complaints, resting comfortably in bed          Objective: Filed Vitals:   09/15/14 2208 09/16/14 0330 09/16/14 0604 09/16/14 1030  BP:  129/54 153/52 138/52  Pulse:  78 84 81  Temp:  98 F (36.7 C) 98.2 F (36.8 C) 98 F (36.7 C)  TempSrc: Oral Oral Oral Oral  Resp: 18 18 14 15   Height:      Weight:      SpO2:  98% 96% 98%   No intake or output data in the 24 hours ending 09/16/14 1258  Exam:  General: No acute respiratory distress Lungs: Clear to auscultation bilaterally without wheezes or crackles Cardiovascular: Regular rate and rhythm without murmur gallop or rub normal S1 and S2 Abdomen: Nontender, nondistended, soft, bowel sounds positive, no rebound, no ascites, no appreciable mass Extremities: No significant cyanosis, clubbing, or edema bilateral lower extremities      Data Reviewed: Basic Metabolic Panel:  Recent Labs Lab 09/12/14 1215 09/14/14 0805  NA 140 139  K 4.8 4.0  CL 108 105  CO2 23 28  GLUCOSE 126* 196*  BUN 12 15  CREATININE 0.74 0.80  CALCIUM 9.1 9.2    Liver Function Tests:  Recent  Labs Lab 09/12/14 1215 09/14/14 0805  AST 33 36  ALT 26 36*  ALKPHOS 67 72  BILITOT 0.6 0.6  PROT 6.0 6.1  ALBUMIN 3.1* 3.0*   No results for input(s): LIPASE, AMYLASE in the last 168 hours. No  results for input(s): AMMONIA in the last 168 hours.  CBC:  Recent Labs Lab 09/13/14 0606  WBC 11.8*  HGB 12.4  HCT 38.4  MCV 102.7*  PLT 407*    Cardiac Enzymes: No results for input(s): CKTOTAL, CKMB, CKMBINDEX, TROPONINI in the last 168 hours. BNP (last 3 results) No results for input(s): BNP in the last 8760 hours.  ProBNP (last 3 results) No results for input(s): PROBNP in the last 8760 hours.    CBG:  Recent Labs Lab 09/15/14 1208 09/15/14 1646 09/15/14 2206 09/16/14 0653 09/16/14 1146  GLUCAP 146* 195* 145* 157* 217*    No results found for this or any previous visit (from the past 240 hour(s)).   Studies: Dg Lumbar Spine Complete  08/19/2014   CLINICAL DATA:  Mid low back pain with ambulation.  EXAM: LUMBAR SPINE - COMPLETE 4+ VIEW  COMPARISON:  03/04/2014 MRI.  02/20/2014 plain films.  FINDINGS: Minimal S-shaped lumbar spine curvature. Sacroiliac joints are symmetric. Left hip osteoarthritis is moderate to marked. Dense aortic atherosclerosis. Maintenance of vertebral body height. Mild straightening of expected lordosis. Degenerative disc disease is advanced at L4-5, L3-4, and L2-3. Facet arthropathy involves lower lumbar spine.  IMPRESSION: Advanced spondylosis, without acute osseous finding.  Atherosclerosis.  Left hip osteoarthritis.   Electronically Signed   By: Jeronimo Greaves M.D.   On: 08/19/2014 12:06   Ct Lumbar Spine W Contrast  09/15/2014   CLINICAL DATA:  Left leg pain and weakness. Spondylosis without myelopathy.  EXAM: LUMBAR MYELOGRAM  FLUOROSCOPY TIME:  2 minutes 0 seconds. Two thousand four hundred twenty-seven micro gray meter squared  PROCEDURE: After thorough discussion of risks and benefits of the procedure including bleeding, infection, injury to nerves, blood vessels, adjacent structures as well as headache and CSF leak, written and oral informed consent was obtained. Consent was obtained by Dr. Paulina Fusi. Time out form was completed.  Patient  was positioned prone on the fluoroscopy table. Local anesthesia was provided with 1% lidocaine without epinephrine after prepped and draped in the usual sterile fashion. Puncture was performed at L5-S1 using a 5 inch 22-gauge spinal needle via a midline approach. Using a single pass through the dura, the needle was placed within the thecal sac, with return of clear CSF. Eighteen of all was injected into the thecal sac, with normal opacification of the nerve roots and cauda equina consistent with free flow within the subarachnoid space.  I personally performed the lumbar puncture and administered the intrathecal contrast. I also personally performed acquisition of the myelogram images.  TECHNIQUE: Contiguous axial images were obtained through the Lumbar spine after the intrathecal infusion of infusion. Coronal and sagittal reconstructions were obtained of the axial image sets.  COMPARISON:  MRI 09/08/2014  FINDINGS: LUMBAR MYELOGRAM FINDINGS:  There are anterior extradural defects throughout the lumbar region, most pronounced at L4-5. There is stenosis of the left lateral recess at L4-5 and diminished filling of the left L4 root sleeve. No other focal nerve root compression is identified.  CT LUMBAR MYELOGRAM FINDINGS:  T12-L1: Disc degeneration. Mild bulging. No stenosis. Conus tip at mid L1.  L1-2:  Minimal bulging of the disc.  No stenosis.  L2-3: Previous posterior decompression. Disc degeneration. Endplate osteophytes.  No central canal stenosis. Foraminal stenosis bilaterally, right worse than left.  L3-4: Previous posterior decompression. Chronic disc degeneration. Endplate osteophytes. No central canal stenosis. Foraminal narrowing bilaterally, right more than left.  L4-5: Previous posterior decompression. Disc degeneration with endplate osteophytes more prominent towards the left. No central canal stenosis. No foraminal stenosis on the right. Severe foraminal stenosis on the left because of bony encroachment.  Left L4 nerve root compression seems likely.  L5-S1: Mild bulging of the disc. Bilateral facet degeneration. No central canal stenosis. No S1 nerve compression. Mild foraminal narrowing bilaterally without compression of the exiting L5 nerve roots.  IMPRESSION: Previous posterior decompression at L2-3, L3-4 and L4-5. No central canal stenosis.  Severe left foraminal stenosis at L4-5 because of encroachment by bony material. Left L4 nerve root would likely be compressed.  Less pronounced foraminal narrowing on the left at L2-3 and L3-4 and on the right at L2-3, L3-4 and L4-5.   Electronically Signed   By: Paulina Fusi M.D.   On: 09/15/2014 15:17   Mr Lumbar Spine Wo Contrast  09/08/2014   CLINICAL DATA:  Left leg pain. Lower extremity weakness. Previous lumbar decompression. Multiple falls at home in the last week.  EXAM: MRI LUMBAR SPINE WITHOUT CONTRAST  TECHNIQUE: Multiplanar, multisequence MR imaging of the lumbar spine was performed. No intravenous contrast was administered.  COMPARISON:  Radiography 08/19/2014.  MRI 03/04/2014.  FINDINGS: There is mild curvature convex to the left with the apex at L3.  T11-12:  Small disc bulge without neural compression.  T12-L1: Small disc bulge without neural compression.  L1-2:  Normal interspace.  L2-3: Previous posterior decompression. Endplate osteophytes and bulging of the disc. Mild narrowing of the lateral recesses without gross neural compression. Mild foraminal stenosis bilaterally.  L3-4: Previous posterior decompression. Endplate osteophytes and mild bulging of the disc. Mild stenosis of both neural foramina without gross neural compression.  L4-5: Previous posterior decompression. Endplate osteophytes and shallow protrusion of disc material. Narrowing of the lateral recesses. Foraminal narrowing left more than right.  L5-S1: Previous posterior decompression. Minimal bulging of the disc. Mild facet degeneration. No significant stenosis.  Compared to the  previous study, no change is seen since September 2015.  IMPRESSION: No change since September 2015. Posterior decompression at L2-3, L3-4, L4-5 and L5-S1. No compressive central canal stenosis. Narrowing of the lateral recesses and foramina at L4-5 could be symptomatic, but is unchanged since Nov 02, 2013. Other less pronounced degenerative changes as outlined above.   Electronically Signed   By: Paulina Fusi M.D.   On: 09/08/2014 09:35   Ir Fluoro Guide Ndl Plmt / Bx  09/10/2014   CLINICAL DATA:  Lumbosacral spondylosis without myelopathy  FLUOROSCOPY TIME:  36 seconds.  PROCEDURE: CAUDAL EPIDURAL INJECTION  Utilizing a caudal approach, the skin overlying the sacral hiatus was cleansed and anesthetized. A 20 gauge Crawford epidural needle was advanced into the sacral epidural space. Injection of Omnipaque 180 shows a good epidural pattern with spread up to L5-S1. No vascular opacification is seen.  120 mg of Depo-Medrol mixed with 5 cc of normal saline and 3 cc of 1% Lidocaine were instilled. The procedure was well-tolerated, and the patient was discharged thirty minutes following the injection in good condition.  IMPRESSION: Technically successful caudal epidural injection.   Electronically Signed   By: Jolaine Click M.D.   On: 09/10/2014 15:37   Dg Myelography Lumbar Inj Lumbosacral  09/15/2014   CLINICAL DATA:  Left leg pain and weakness. Spondylosis without  myelopathy.  EXAM: LUMBAR MYELOGRAM  FLUOROSCOPY TIME:  2 minutes 0 seconds. Two thousand four hundred twenty-seven micro gray meter squared  PROCEDURE: After thorough discussion of risks and benefits of the procedure including bleeding, infection, injury to nerves, blood vessels, adjacent structures as well as headache and CSF leak, written and oral informed consent was obtained. Consent was obtained by Dr. Paulina Fusi. Time out form was completed.  Patient was positioned prone on the fluoroscopy table. Local anesthesia was provided with 1% lidocaine without  epinephrine after prepped and draped in the usual sterile fashion. Puncture was performed at L5-S1 using a 5 inch 22-gauge spinal needle via a midline approach. Using a single pass through the dura, the needle was placed within the thecal sac, with return of clear CSF. Eighteen of all was injected into the thecal sac, with normal opacification of the nerve roots and cauda equina consistent with free flow within the subarachnoid space.  I personally performed the lumbar puncture and administered the intrathecal contrast. I also personally performed acquisition of the myelogram images.  TECHNIQUE: Contiguous axial images were obtained through the Lumbar spine after the intrathecal infusion of infusion. Coronal and sagittal reconstructions were obtained of the axial image sets.  COMPARISON:  MRI 09/08/2014  FINDINGS: LUMBAR MYELOGRAM FINDINGS:  There are anterior extradural defects throughout the lumbar region, most pronounced at L4-5. There is stenosis of the left lateral recess at L4-5 and diminished filling of the left L4 root sleeve. No other focal nerve root compression is identified.  CT LUMBAR MYELOGRAM FINDINGS:  T12-L1: Disc degeneration. Mild bulging. No stenosis. Conus tip at mid L1.  L1-2:  Minimal bulging of the disc.  No stenosis.  L2-3: Previous posterior decompression. Disc degeneration. Endplate osteophytes. No central canal stenosis. Foraminal stenosis bilaterally, right worse than left.  L3-4: Previous posterior decompression. Chronic disc degeneration. Endplate osteophytes. No central canal stenosis. Foraminal narrowing bilaterally, right more than left.  L4-5: Previous posterior decompression. Disc degeneration with endplate osteophytes more prominent towards the left. No central canal stenosis. No foraminal stenosis on the right. Severe foraminal stenosis on the left because of bony encroachment. Left L4 nerve root compression seems likely.  L5-S1: Mild bulging of the disc. Bilateral facet  degeneration. No central canal stenosis. No S1 nerve compression. Mild foraminal narrowing bilaterally without compression of the exiting L5 nerve roots.  IMPRESSION: Previous posterior decompression at L2-3, L3-4 and L4-5. No central canal stenosis.  Severe left foraminal stenosis at L4-5 because of encroachment by bony material. Left L4 nerve root would likely be compressed.  Less pronounced foraminal narrowing on the left at L2-3 and L3-4 and on the right at L2-3, L3-4 and L4-5.   Electronically Signed   By: Paulina Fusi M.D.   On: 09/15/2014 15:17    Scheduled Meds: . amLODipine  10 mg Oral Daily  . aspirin EC  81 mg Oral QHS  . colesevelam  1,875 mg Oral BID WC  . ezetimibe  10 mg Oral Daily  . feeding supplement (ENSURE COMPLETE)  237 mL Oral BID BM  . heparin  5,000 Units Subcutaneous 3 times per day  . insulin aspart  0-9 Units Subcutaneous TID WC  . methocarbamol  500 mg Oral TID  . polyethylene glycol  17 g Oral Daily  . rosuvastatin  20 mg Oral Daily   Continuous Infusions:    Principal Problem:   FTT (failure to thrive) in adult Active Problems:   Peripheral arterial disease   Lumbar stenosis  with neurogenic claudication   Generalized weakness   AKI (acute kidney injury)   Dehydration    Time spent: 40 minutes   [facility] Center[facility]OL,Kourtney Montesinos  [facility] Pager [phone]. If 7PM-7AM, please contact night-coverage at [email or web address], password [email or web address] 09/16/2014, 12:58 PM  LOS: 9 days

## 2014-09-16 NOTE — Progress Notes (Signed)
Patient ID: Tina [name], female   DOB: 02/18/1931, 81 y.o.   MRN: [id number] Tomorrow ,patient is schedule for a left l45 laminotomy and foraminotomy. Npo after midnight

## 2014-09-16 NOTE — Progress Notes (Signed)
PT Cancellation Note  Patient Details Name: [name] Prude MRN: [id number] DOB: 10/13/1930   Cancelled Treatment:    Reason Eval/Treat Not Completed: Patient declined, no reason specified Pt refused therapy today, "I am very agitated today." Explained importance of mobility however pt continues to refuse. Will follow up next available time.   Alvie HeidelbergFolan, Charlean Carneal A 09/16/2014, 1:12 PM Alvie HeidelbergShauna Folan, PT, DPT [phone]

## 2014-09-16 NOTE — Progress Notes (Signed)
Patient ID: Tina [name], female   DOB: 01/19/1931, 81 y.o.   MRN: [id number] Myelogram seen. Patient had a left l4-5 stenosis with compromise of the l5 nerve root. Patient schedule for a left l45 laminotomy and foraminotomy. She is aware of risks and benefits. No family around. To or in am

## 2014-09-17 ENCOUNTER — Encounter (HOSPITAL_COMMUNITY)
Admission: EM | Disposition: A | Discharge status: Skilled Nursing Facility | Payer: Self-pay | Source: Home / Self Care | Attending: Internal Medicine

## 2014-09-17 ENCOUNTER — Inpatient Hospital Stay (HOSPITAL_COMMUNITY): Payer: Medicare Other | Admitting: Anesthesiology

## 2014-09-17 ENCOUNTER — Inpatient Hospital Stay (HOSPITAL_COMMUNITY): Payer: Medicare Other

## 2014-09-17 ENCOUNTER — Encounter (HOSPITAL_COMMUNITY): Payer: Self-pay | Admitting: Internal Medicine

## 2014-09-17 DIAGNOSIS — E1159 Type 2 diabetes mellitus with other circulatory complications: Secondary | ICD-10-CM

## 2014-09-17 DIAGNOSIS — IMO0002 Reserved for concepts with insufficient information to code with codable children: Secondary | ICD-10-CM

## 2014-09-17 DIAGNOSIS — E1165 Type 2 diabetes mellitus with hyperglycemia: Secondary | ICD-10-CM

## 2014-09-17 DIAGNOSIS — M5416 Radiculopathy, lumbar region: Secondary | ICD-10-CM

## 2014-09-17 DIAGNOSIS — E1151 Type 2 diabetes mellitus with diabetic peripheral angiopathy without gangrene: Secondary | ICD-10-CM

## 2014-09-17 DIAGNOSIS — I10 Essential (primary) hypertension: Secondary | ICD-10-CM

## 2014-09-17 HISTORY — PX: LUMBAR LAMINECTOMY/DECOMPRESSION MICRODISCECTOMY: SHX5026

## 2014-09-17 HISTORY — DX: Reserved for concepts with insufficient information to code with codable children: IMO0002

## 2014-09-17 HISTORY — DX: Type 2 diabetes mellitus with diabetic peripheral angiopathy without gangrene: E11.51

## 2014-09-17 LAB — COMPREHENSIVE METABOLIC PANEL
ALBUMIN: 2.8 g/dL — AB (ref 3.5–5.2)
ALK PHOS: 81 U/L (ref 39–117)
ALT: 40 U/L — ABNORMAL HIGH (ref 0–35)
AST: 27 U/L (ref 0–37)
Anion gap: 8 (ref 5–15)
BILIRUBIN TOTAL: 1.2 mg/dL (ref 0.3–1.2)
BUN: 19 mg/dL (ref 6–23)
CHLORIDE: 106 mmol/L (ref 96–112)
CO2: 21 mmol/L (ref 19–32)
Calcium: 9.2 mg/dL (ref 8.4–10.5)
Creatinine, Ser: 0.81 mg/dL (ref 0.50–1.10)
GFR calc Af Amer: 77 mL/min — ABNORMAL LOW (ref >90)
GFR calc non Af Amer: 66 mL/min — ABNORMAL LOW (ref >90)
Glucose, Bld: 131 mg/dL — ABNORMAL HIGH (ref 70–99)
POTASSIUM: 5 mmol/L (ref 3.5–5.1)
Sodium: 135 mmol/L (ref 135–145)
Total Protein: 6.4 g/dL (ref 6.0–8.3)

## 2014-09-17 LAB — URINALYSIS, ROUTINE W REFLEX MICROSCOPIC
Bilirubin Urine: NEGATIVE
GLUCOSE, UA: NEGATIVE mg/dL
Hgb urine dipstick: NEGATIVE
Ketones, ur: NEGATIVE mg/dL
LEUKOCYTES UA: NEGATIVE
Nitrite: NEGATIVE
Protein, ur: NEGATIVE mg/dL
SPECIFIC GRAVITY, URINE: 1.019 (ref 1.005–1.030)
UROBILINOGEN UA: 0.2 mg/dL (ref 0.0–1.0)
pH: 5 (ref 5.0–8.0)

## 2014-09-17 LAB — CBC
HCT: 38.1 % (ref 36.0–46.0)
HEMOGLOBIN: 12.6 g/dL (ref 12.0–15.0)
MCH: 33.6 pg (ref 26.0–34.0)
MCHC: 33.1 g/dL (ref 30.0–36.0)
MCV: 101.6 fL — ABNORMAL HIGH (ref 78.0–100.0)
Platelets: 301 10*3/uL (ref 150–400)
RBC: 3.75 MIL/uL — AB (ref 3.87–5.11)
RDW: 14 % (ref 11.5–15.5)
WBC: 20.6 10*3/uL — ABNORMAL HIGH (ref 4.0–10.5)

## 2014-09-17 LAB — GLUCOSE, CAPILLARY
GLUCOSE-CAPILLARY: 174 mg/dL — AB (ref 70–99)
Glucose-Capillary: 147 mg/dL — ABNORMAL HIGH (ref 70–99)
Glucose-Capillary: 167 mg/dL — ABNORMAL HIGH (ref 70–99)
Glucose-Capillary: 207 mg/dL — ABNORMAL HIGH (ref 70–99)
Glucose-Capillary: 147 mg/dL — ABNORMAL HIGH (ref 70–99)

## 2014-09-17 LAB — SURGICAL PCR SCREEN
MRSA, PCR: NEGATIVE
Staphylococcus aureus: POSITIVE — AB

## 2014-09-17 SURGERY — LUMBAR LAMINECTOMY/DECOMPRESSION MICRODISCECTOMY 1 LEVEL
Anesthesia: General | Site: Back | Laterality: Left

## 2014-09-17 MED ORDER — OXYCODONE-ACETAMINOPHEN 5-325 MG PO TABS
1.0000 | ORAL_TABLET | ORAL | Status: DC | PRN
  Administered 2014-09-17 – 2014-09-18 (×5): 2 via ORAL
  Administered 2014-09-20 (×2): 1 via ORAL
  Administered 2014-09-20: 2 via ORAL
  Administered 2014-09-21 – 2014-09-22 (×4): 1 via ORAL
  Administered 2014-09-22: 2 via ORAL
  Administered 2014-09-22: 1 via ORAL
  Filled 2014-09-17 (×2): qty 1
  Filled 2014-09-17 (×2): qty 2
  Filled 2014-09-17 (×4): qty 1
  Filled 2014-09-17 (×4): qty 2
  Filled 2014-09-17: qty 1
  Filled 2014-09-17: qty 2
  Filled 2014-09-17: qty 1

## 2014-09-17 MED ORDER — THROMBIN 5000 UNITS EX SOLR
CUTANEOUS | Status: DC | PRN
  Administered 2014-09-17 (×2): 5000 [IU] via TOPICAL

## 2014-09-17 MED ORDER — PHENYLEPHRINE HCL 10 MG/ML IJ SOLN
10.0000 mg | INTRAVENOUS | Status: DC | PRN
  Administered 2014-09-17: 15 ug/min via INTRAVENOUS

## 2014-09-17 MED ORDER — ROCURONIUM BROMIDE 100 MG/10ML IV SOLN
INTRAVENOUS | Status: DC | PRN
  Administered 2014-09-17: 30 mg via INTRAVENOUS

## 2014-09-17 MED ORDER — SUCCINYLCHOLINE CHLORIDE 20 MG/ML IJ SOLN
INTRAMUSCULAR | Status: AC
  Filled 2014-09-17: qty 1

## 2014-09-17 MED ORDER — SODIUM CHLORIDE 0.9 % IJ SOLN
3.0000 mL | Freq: Two times a day (BID) | INTRAMUSCULAR | Status: DC
  Administered 2014-09-17 – 2014-09-22 (×7): 3 mL via INTRAVENOUS

## 2014-09-17 MED ORDER — FENTANYL CITRATE 0.05 MG/ML IJ SOLN
INTRAMUSCULAR | Status: DC | PRN
  Administered 2014-09-17 (×2): 50 ug via INTRAVENOUS

## 2014-09-17 MED ORDER — FENTANYL CITRATE 0.05 MG/ML IJ SOLN
INTRAMUSCULAR | Status: AC
  Administered 2014-09-17: 25 ug via INTRAVENOUS
  Filled 2014-09-17: qty 2

## 2014-09-17 MED ORDER — SODIUM CHLORIDE 0.9 % IV SOLN
250.0000 mL | INTRAVENOUS | Status: DC
Start: 2014-09-17 — End: 2014-09-22

## 2014-09-17 MED ORDER — PHENYLEPHRINE 40 MCG/ML (10ML) SYRINGE FOR IV PUSH (FOR BLOOD PRESSURE SUPPORT)
PREFILLED_SYRINGE | INTRAVENOUS | Status: AC
  Filled 2014-09-17: qty 10

## 2014-09-17 MED ORDER — SODIUM CHLORIDE 0.9 % IJ SOLN
INTRAMUSCULAR | Status: AC
  Filled 2014-09-17: qty 10

## 2014-09-17 MED ORDER — LIDOCAINE HCL (CARDIAC) 20 MG/ML IV SOLN
INTRAVENOUS | Status: DC | PRN
  Administered 2014-09-17: 100 mg via INTRAVENOUS

## 2014-09-17 MED ORDER — SODIUM CHLORIDE 0.9 % IV SOLN
INTRAVENOUS | Status: DC
  Administered 2014-09-17: 16:00:00 via INTRAVENOUS
  Administered 2014-09-18: 75 mL/h via INTRAVENOUS

## 2014-09-17 MED ORDER — ACETAMINOPHEN 650 MG RE SUPP: 650.0000 mg | RECTAL | Status: DC | PRN

## 2014-09-17 MED ORDER — ARTIFICIAL TEARS OP OINT
TOPICAL_OINTMENT | OPHTHALMIC | Status: DC | PRN
  Administered 2014-09-17: 1 via OPHTHALMIC

## 2014-09-17 MED ORDER — GLYCOPYRROLATE 0.2 MG/ML IJ SOLN
INTRAMUSCULAR | Status: DC | PRN
  Administered 2014-09-17: 0.4 mg via INTRAVENOUS

## 2014-09-17 MED ORDER — EPHEDRINE SULFATE 50 MG/ML IJ SOLN
INTRAMUSCULAR | Status: AC
  Filled 2014-09-17: qty 1

## 2014-09-17 MED ORDER — SODIUM CHLORIDE 0.9 % IJ SOLN: 3.0000 mL | INTRAMUSCULAR | Status: DC | PRN

## 2014-09-17 MED ORDER — ONDANSETRON HCL 4 MG/2ML IJ SOLN
INTRAMUSCULAR | Status: DC | PRN
  Administered 2014-09-17: 4 mg via INTRAVENOUS

## 2014-09-17 MED ORDER — ALBUMIN HUMAN 5 % IV SOLN
INTRAVENOUS | Status: DC | PRN
  Administered 2014-09-17 (×2): via INTRAVENOUS

## 2014-09-17 MED ORDER — LACTATED RINGERS IV SOLN
INTRAVENOUS | Status: DC | PRN
  Administered 2014-09-17 (×2): via INTRAVENOUS

## 2014-09-17 MED ORDER — NEOSTIGMINE METHYLSULFATE 10 MG/10ML IV SOLN
INTRAVENOUS | Status: DC | PRN
  Administered 2014-09-17: 3 mg via INTRAVENOUS

## 2014-09-17 MED ORDER — LISINOPRIL 10 MG PO TABS
10.0000 mg | ORAL_TABLET | Freq: Every day | ORAL | Status: DC
  Administered 2014-09-17 – 2014-09-22 (×6): 10 mg via ORAL
  Filled 2014-09-17 (×6): qty 1

## 2014-09-17 MED ORDER — SUCCINYLCHOLINE CHLORIDE 20 MG/ML IJ SOLN
INTRAMUSCULAR | Status: DC | PRN
  Administered 2014-09-17: 100 mg via INTRAVENOUS

## 2014-09-17 MED ORDER — PROPOFOL 10 MG/ML IV BOLUS
INTRAVENOUS | Status: DC | PRN
  Administered 2014-09-17: 140 mg via INTRAVENOUS

## 2014-09-17 MED ORDER — CEFAZOLIN SODIUM-DEXTROSE 2-3 GM-% IV SOLR
INTRAVENOUS | Status: DC | PRN
  Administered 2014-09-17: 2 g via INTRAVENOUS

## 2014-09-17 MED ORDER — 0.9 % SODIUM CHLORIDE (POUR BTL) OPTIME
TOPICAL | Status: DC | PRN
  Administered 2014-09-17: 1000 mL

## 2014-09-17 MED ORDER — ROCURONIUM BROMIDE 50 MG/5ML IV SOLN
INTRAVENOUS | Status: AC
  Filled 2014-09-17: qty 1

## 2014-09-17 MED ORDER — ARTIFICIAL TEARS OP OINT
TOPICAL_OINTMENT | OPHTHALMIC | Status: AC
  Filled 2014-09-17: qty 3.5

## 2014-09-17 MED ORDER — ONDANSETRON HCL 4 MG/2ML IJ SOLN
INTRAMUSCULAR | Status: AC
  Filled 2014-09-17: qty 2

## 2014-09-17 MED ORDER — FENTANYL CITRATE 0.05 MG/ML IJ SOLN
25.0000 ug | INTRAMUSCULAR | Status: DC | PRN
  Administered 2014-09-17 (×3): 25 ug via INTRAVENOUS

## 2014-09-17 MED ORDER — MENTHOL 3 MG MT LOZG
1.0000 | LOZENGE | OROMUCOSAL | Status: DC | PRN
  Administered 2014-09-17: 3 mg via ORAL
  Filled 2014-09-17: qty 9

## 2014-09-17 MED ORDER — PHENYLEPHRINE HCL 10 MG/ML IJ SOLN
INTRAMUSCULAR | Status: DC | PRN
  Administered 2014-09-17 (×2): 40 ug via INTRAVENOUS

## 2014-09-17 MED ORDER — MORPHINE SULFATE 2 MG/ML IJ SOLN
1.0000 mg | INTRAMUSCULAR | Status: DC | PRN
  Administered 2014-09-18 – 2014-09-21 (×3): 2 mg via INTRAVENOUS
  Filled 2014-09-17 (×4): qty 1

## 2014-09-17 MED ORDER — LINAGLIPTIN 5 MG PO TABS
5.0000 mg | ORAL_TABLET | Freq: Every day | ORAL | Status: DC
  Administered 2014-09-17 – 2014-09-22 (×6): 5 mg via ORAL
  Filled 2014-09-17 (×6): qty 1

## 2014-09-17 MED ORDER — NEOSTIGMINE METHYLSULFATE 10 MG/10ML IV SOLN
INTRAVENOUS | Status: AC
  Filled 2014-09-17: qty 1

## 2014-09-17 MED ORDER — MIDAZOLAM HCL 2 MG/2ML IJ SOLN
INTRAMUSCULAR | Status: AC
  Filled 2014-09-17: qty 2

## 2014-09-17 MED ORDER — ONDANSETRON HCL 4 MG/2ML IJ SOLN
4.0000 mg | INTRAMUSCULAR | Status: DC | PRN
  Administered 2014-09-20: 4 mg via INTRAVENOUS

## 2014-09-17 MED ORDER — DIAZEPAM 5 MG PO TABS
ORAL_TABLET | ORAL | Status: AC
  Filled 2014-09-17: qty 1

## 2014-09-17 MED ORDER — VANCOMYCIN HCL 1000 MG IV SOLR
INTRAVENOUS | Status: DC | PRN
  Administered 2014-09-17: 1000 mg

## 2014-09-17 MED ORDER — LIDOCAINE HCL (CARDIAC) 20 MG/ML IV SOLN
INTRAVENOUS | Status: AC
  Filled 2014-09-17: qty 5

## 2014-09-17 MED ORDER — DIAZEPAM 5 MG PO TABS
5.0000 mg | ORAL_TABLET | Freq: Four times a day (QID) | ORAL | Status: DC | PRN
  Administered 2014-09-17 – 2014-09-20 (×4): 5 mg via ORAL
  Filled 2014-09-17 (×3): qty 1

## 2014-09-17 MED ORDER — HEMOSTATIC AGENTS (NO CHARGE) OPTIME
TOPICAL | Status: DC | PRN
  Administered 2014-09-17: 1 via TOPICAL

## 2014-09-17 MED ORDER — ACETAMINOPHEN 325 MG PO TABS: 650.0000 mg | ORAL_TABLET | ORAL | Status: DC | PRN

## 2014-09-17 MED ORDER — FENTANYL CITRATE 0.05 MG/ML IJ SOLN
INTRAMUSCULAR | Status: AC
  Filled 2014-09-17: qty 5

## 2014-09-17 MED ORDER — PROPOFOL 10 MG/ML IV BOLUS
INTRAVENOUS | Status: AC
  Filled 2014-09-17: qty 20

## 2014-09-17 MED ORDER — VANCOMYCIN HCL 1000 MG IV SOLR
INTRAVENOUS | Status: AC
  Filled 2014-09-17: qty 1000

## 2014-09-17 MED ORDER — GLYCOPYRROLATE 0.2 MG/ML IJ SOLN
INTRAMUSCULAR | Status: AC
  Filled 2014-09-17: qty 3

## 2014-09-17 MED ORDER — PHENOL 1.4 % MT LIQD: 1.0000 | OROMUCOSAL | Status: DC | PRN

## 2014-09-17 MED ORDER — CEFAZOLIN SODIUM 1-5 GM-% IV SOLN
1.0000 g | Freq: Three times a day (TID) | INTRAVENOUS | Status: AC
  Administered 2014-09-17 (×2): 1 g via INTRAVENOUS
  Filled 2014-09-17 (×2): qty 50

## 2014-09-17 SURGICAL SUPPLY — 63 items
BENZOIN TINCTURE PRP APPL 2/3 (GAUZE/BANDAGES/DRESSINGS) ×3 IMPLANT
BLADE CLIPPER SURG (BLADE) IMPLANT
BUR ACORN 6.0 (BURR) ×2 IMPLANT
BUR ACORN 6.0MM (BURR) ×1
BUR MATCHSTICK NEURO 3.0 LAGG (BURR) IMPLANT
CANISTER SUCT 3000ML PPV (MISCELLANEOUS) ×3 IMPLANT
CLOSURE WOUND 1/2 X4 (GAUZE/BANDAGES/DRESSINGS) ×1
CONT SPEC 4OZ CLIKSEAL STRL BL (MISCELLANEOUS) ×3 IMPLANT
DRAPE LAPAROTOMY 100X72X124 (DRAPES) ×3 IMPLANT
DRAPE MICROSCOPE LEICA (MISCELLANEOUS) ×3 IMPLANT
DRAPE POUCH INSTRU U-SHP 10X18 (DRAPES) ×3 IMPLANT
DRSG OPSITE POSTOP 4X6 (GAUZE/BANDAGES/DRESSINGS) ×3 IMPLANT
DRSG PAD ABDOMINAL 8X10 ST (GAUZE/BANDAGES/DRESSINGS) IMPLANT
DURAPREP 26ML APPLICATOR (WOUND CARE) ×3 IMPLANT
DURASEAL APPLICATOR TIP (TIP) ×3 IMPLANT
DURASEAL SPINE SEALANT 3ML (MISCELLANEOUS) ×3 IMPLANT
ELECT BLADE 4.0 EZ CLEAN MEGAD (MISCELLANEOUS) ×3
ELECT REM PT RETURN 9FT ADLT (ELECTROSURGICAL) ×3
ELECTRODE BLDE 4.0 EZ CLN MEGD (MISCELLANEOUS) ×1 IMPLANT
ELECTRODE REM PT RTRN 9FT ADLT (ELECTROSURGICAL) ×1 IMPLANT
GAUZE SPONGE 4X4 12PLY STRL (GAUZE/BANDAGES/DRESSINGS) ×3 IMPLANT
GAUZE SPONGE 4X4 16PLY XRAY LF (GAUZE/BANDAGES/DRESSINGS) IMPLANT
GLOVE BIO SURGEON STRL SZ8 (GLOVE) ×6 IMPLANT
GLOVE BIOGEL M 8.0 STRL (GLOVE) ×3 IMPLANT
GLOVE BIOGEL PI IND STRL 7.5 (GLOVE) ×1 IMPLANT
GLOVE BIOGEL PI IND STRL 8.5 (GLOVE) ×2 IMPLANT
GLOVE BIOGEL PI INDICATOR 7.5 (GLOVE) ×2
GLOVE BIOGEL PI INDICATOR 8.5 (GLOVE) ×4
GLOVE EXAM NITRILE LRG STRL (GLOVE) IMPLANT
GLOVE EXAM NITRILE MD LF STRL (GLOVE) IMPLANT
GLOVE EXAM NITRILE XL STR (GLOVE) IMPLANT
GLOVE EXAM NITRILE XS STR PU (GLOVE) IMPLANT
GLOVE SURG SS PI 7.0 STRL IVOR (GLOVE) ×6 IMPLANT
GOWN STRL REUS W/ TWL LRG LVL3 (GOWN DISPOSABLE) ×1 IMPLANT
GOWN STRL REUS W/ TWL XL LVL3 (GOWN DISPOSABLE) ×2 IMPLANT
GOWN STRL REUS W/TWL 2XL LVL3 (GOWN DISPOSABLE) IMPLANT
GOWN STRL REUS W/TWL LRG LVL3 (GOWN DISPOSABLE) ×2
GOWN STRL REUS W/TWL XL LVL3 (GOWN DISPOSABLE) ×4
KIT BASIN OR (CUSTOM PROCEDURE TRAY) ×3 IMPLANT
KIT ROOM TURNOVER OR (KITS) ×3 IMPLANT
NEEDLE HYPO 18GX1.5 BLUNT FILL (NEEDLE) IMPLANT
NEEDLE HYPO 21X1.5 SAFETY (NEEDLE) IMPLANT
NEEDLE HYPO 25X1 1.5 SAFETY (NEEDLE) IMPLANT
NEEDLE SPNL 20GX3.5 QUINCKE YW (NEEDLE) ×3 IMPLANT
NS IRRIG 1000ML POUR BTL (IV SOLUTION) ×3 IMPLANT
PACK LAMINECTOMY NEURO (CUSTOM PROCEDURE TRAY) ×3 IMPLANT
PAD ARMBOARD 7.5X6 YLW CONV (MISCELLANEOUS) ×12 IMPLANT
PATTIES SURGICAL .5 X1 (DISPOSABLE) IMPLANT
RUBBERBAND STERILE (MISCELLANEOUS) ×6 IMPLANT
SPONGE LAP 4X18 X RAY DECT (DISPOSABLE) IMPLANT
SPONGE SURGIFOAM ABS GEL SZ50 (HEMOSTASIS) ×3 IMPLANT
STAPLER SKIN PROX WIDE 3.9 (STAPLE) ×3 IMPLANT
STRIP CLOSURE SKIN 1/2X4 (GAUZE/BANDAGES/DRESSINGS) ×2 IMPLANT
SUT VIC AB 0 CT1 18XCR BRD8 (SUTURE) ×1 IMPLANT
SUT VIC AB 0 CT1 8-18 (SUTURE) ×2
SUT VIC AB 2-0 CP2 18 (SUTURE) ×3 IMPLANT
SUT VIC AB 3-0 SH 8-18 (SUTURE) ×3 IMPLANT
SYR 20CC LL (SYRINGE) IMPLANT
SYR 20ML ECCENTRIC (SYRINGE) ×3 IMPLANT
SYR 5ML LL (SYRINGE) IMPLANT
TOWEL OR 17X24 6PK STRL BLUE (TOWEL DISPOSABLE) ×3 IMPLANT
TOWEL OR 17X26 10 PK STRL BLUE (TOWEL DISPOSABLE) ×3 IMPLANT
WATER STERILE IRR 1000ML POUR (IV SOLUTION) ×3 IMPLANT

## 2014-09-17 NOTE — Anesthesia Postprocedure Evaluation (Signed)
  Anesthesia Post-op Note  Patient: Tina [name]  Procedure(s) Performed: Procedure(s) with comments: Left Lumbar four-five Foraminotomy (Left) - Left L4-5 Foraminotomy  Patient Location: PACU  Anesthesia Type:General  Level of Consciousness: awake  Airway and Oxygen Therapy: Patient Spontanous Breathing  Post-op Pain: mild  Post-op Assessment: Post-op Vital signs reviewed  Post-op Vital Signs: Reviewed  Last Vitals:  Filed Vitals:   09/17/14 1100  BP:   Pulse: 84  Temp:   Resp: 16    Complications: No apparent anesthesia complications

## 2014-09-17 NOTE — Transfer of Care (Signed)
Immediate Anesthesia Transfer of Care Note  Patient: Tina [name]  Procedure(s) Performed: Procedure(s) with comments: Left Lumbar four-five Foraminotomy (Left) - Left L4-5 Foraminotomy  Patient Location: PACU  Anesthesia Type:General  Level of Consciousness: awake, alert , oriented and sedated  Airway & Oxygen Therapy: Patient Spontanous Breathing and Patient connected to face mask oxygen  Post-op Assessment: Report given to RN, Post -op Vital signs reviewed and stable and Patient moving all extremities  Post vital signs: Reviewed and stable  Last Vitals:  Filed Vitals:   09/17/14 1038  BP:   Pulse:   Temp: 36.3 C  Resp:     Complications: No apparent anesthesia complications

## 2014-09-17 NOTE — Clinical Social Work Note (Signed)
CSW extended SNF search to [facility]. CSW received a call from [place] from the [facility]. Levada Dy reported her medical director would like her to met with the pt to determine if they will accept the pt. CSW will continue to identify SNF placement for the pt.   [place], MSW, [place]

## 2014-09-17 NOTE — Anesthesia Preprocedure Evaluation (Addendum)
Anesthesia Evaluation  Patient identified by MRN, date of birth, ID band Patient awake    Reviewed: Allergy & Precautions, NPO status , Patient's Chart, lab work & pertinent test results  Airway Mallampati: II  TM Distance: >3 FB Neck ROM: Full    Dental   Pulmonary shortness of breath, pneumonia -, former smoker,  breath sounds clear to auscultation        Cardiovascular hypertension, + Peripheral Vascular Disease Rhythm:Regular Rate:Normal     Neuro/Psych    GI/Hepatic GERD-  ,(+) Hepatitis -  Endo/Other  diabetes  Renal/GU Renal disease     Musculoskeletal   Abdominal   Peds  Hematology   Anesthesia Other Findings   Reproductive/Obstetrics                            Anesthesia Physical Anesthesia Plan  ASA: III  Anesthesia Plan: General   Post-op Pain Management:    Induction: Intravenous  Airway Management Planned: Oral ETT  Additional Equipment:   Intra-op Plan:   Post-operative Plan: Extubation in OR and Possible Post-op intubation/ventilation  Informed Consent: I have reviewed the patients History and Physical, chart, labs and discussed the procedure including the risks, benefits and alternatives for the proposed anesthesia with the patient or authorized representative who has indicated his/her understanding and acceptance.   Dental advisory given  Plan Discussed with: CRNA and Anesthesiologist  Anesthesia Plan Comments:         Anesthesia Quick Evaluation

## 2014-09-17 NOTE — Progress Notes (Signed)
OT Cancellation Note  Patient Details Name: [name] Coger MRN: [id number] DOB: 04/09/1931   Cancelled Treatment:    Reason Eval/Treat Not Completed: Patient at procedure or test/ unavailable. Pt was off floor for lumbar lami when acute OT attempted to see. OT will follow up with pt when medically ready to assess post-op.   Nena JordanMiller, Blinda Turek M   Carney LivingLeeAnn Marie Sedale Jenifer, OTR/L Occupational Therapist [phone] (pager)  09/17/2014, 4:18 PM

## 2014-09-17 NOTE — Progress Notes (Signed)
[facility] PROGRESS NOTE  Tina [name]:[id number] DOB: 09-16-1930 DOA: 09/07/2014 PCP: Fredirick Maudlin, MD  Assessment/Plan: #1 lumbar stenosis with left leg radiculopathy/neurogenic claudication Status post left L4-5 foraminotomy per Dr. Jeral Fruit 09/17/2014. Pain management. Per NS.  #2 hypertension Stable. Continue Norvasc and lisinopril  #3 diabetes mellitus Continue trajenta. Sliding scale insulin.  #4 hyperlipidemia Continue Crestor.  #5 anxiety Xanax as needed.  #6 urinary frequency Urinalysis negative  #7 prophylaxis Heparin for DVT prophylaxis.  Code Status: Full Family Communication: Updated patient of family at bedside. Disposition Plan: Likely need SNF on discharge.   Consultants:  Neurosurgery: Dr. Jeral Fruit 09/12/2014  Procedures:  CT lumbar spine 09/15/2014  X-ray of the lumbar spine 09/17/2014  Myelography of the L-spine 09/15/2014  Left L4-5 foraminotomy per Dr. Jeral Fruit 09/17/2014  Antibiotics:  None  HPI/Subjective: Patient complaining of back pain. Patient just returned from surgery.  Objective: Filed Vitals:   09/17/14 1454  BP: 143/52  Pulse: 82  Temp: 97.6 F (36.4 C)  Resp: 15    Intake/Output Summary (Last 24 hours) at 09/17/14 1716 Last data filed at 09/17/14 1406  Gross per 24 hour  Intake 1579.25 ml  Output   1220 ml  Net 359.25 ml   Filed Weights   09/07/14 1619 09/07/14 2128  Weight: 104.327 kg (230 lb) 101.651 kg (224 lb 1.6 oz)    Exam:   General:  NAD  Cardiovascular: RRR  Respiratory: CTAB anterior lung fields.  Abdomen: Soft, nontender, nondistended, positive bowel sounds.  Musculoskeletal: No clubbing cyanosis or edema.  Data Reviewed: Basic Metabolic Panel:  Recent Labs Lab 09/12/14 1215 09/14/14 0805 09/17/14 0610  NA 140 139 135  K 4.8 4.0 5.0  CL 108 105 106  CO2 GLUCOSE 126* 196* 131*  BUN CREATININE 0.74 0.80 0.81  CALCIUM 9.1 9.2 9.2   Liver  Function Tests:  Recent Labs Lab 09/12/14 1215 09/14/14 0805 09/17/14 0610  AST 33 36 27  ALT 26 36* 40*  ALKPHOS 67 72 81  BILITOT 0.6 0.6 1.2  PROT 6.0 6.1 6.4  ALBUMIN 3.1* 3.0* 2.8*   No results for input(s): LIPASE, AMYLASE in the last 168 hours. No results for input(s): AMMONIA in the last 168 hours. CBC:  Recent Labs Lab 09/13/14 0606 09/17/14 0610  WBC 11.8* 20.6*  HGB 12.4 12.6  HCT 38.4 38.1  MCV 102.7* 101.6*  PLT 407* 301   Cardiac Enzymes: No results for input(s): CKTOTAL, CKMB, CKMBINDEX, TROPONINI in the last 168 hours. BNP (last 3 results) No results for input(s): BNP in the last 8760 hours.  ProBNP (last 3 results) No results for input(s): PROBNP in the last 8760 hours.  CBG:  Recent Labs Lab 09/16/14 2104 09/17/14 0654 09/17/14 1042 09/17/14 1144 09/17/14 1628  GLUCAP 172* 147* 167* 174* 207*    Recent Results (from the past 240 hour(s))  Surgical pcr screen     Status: Abnormal   Collection Time: 09/17/14  6:44 AM  Result Value Ref Range Status   MRSA, PCR NEGATIVE NEGATIVE Final   Staphylococcus aureus POSITIVE (A) NEGATIVE Final    Comment:        The Xpert SA Assay (FDA approved for NASAL specimens in patients over 79 years of age), is one component of a comprehensive surveillance program.  Test performance has been validated by [facility] for patients greater than or equal to 79 year old. It is not intended to diagnose infection nor  to guide or monitor treatment.      Studies: Dg Lumbar Spine 1 View  09/17/2014   CLINICAL DATA:  L4-5 foraminotomy  EXAM: DG C-ARM 61-120 MIN; LUMBAR SPINE - 1 VIEW  COMPARISON:  None.  FLUOROSCOPY TIME:  Radiation Exposure Index (as provided by the fluoroscopic device):  If the device does not provide the exposure index:  Fluoroscopy Time:  5 seconds  Number of Acquired Images:  0  FINDINGS: Single cross-table lateral view of the lower lumbar spine demonstrates posterior surgical instruments  directed at the L4-5 and L5-S1 levels.  IMPRESSION: Intraoperative localization as above.   Electronically Signed   By: Charlett NoseKevin  Dover M.D.   On: 09/17/2014 11:19   Dg Lumbar Spine 1 View  09/17/2014   CLINICAL DATA:  Left L4-5 foraminotomy  EXAM: LUMBAR SPINE - 1 VIEW  COMPARISON:  Myelogram of September 15, 2014 and lumbar spine series of August 19, 2014  FINDINGS: The images are suboptimal and it is not possible to accurately localize the lumbosacral junction. Numbering was performed using the lower most rib-bearing vertebral body as T12. The metallic trocar appears to lie approximately 1.5 cm posterior to the L3-L4 disc level.  IMPRESSION: Surgical metallic localization device lying approximately 1.5 cm posterior to the L3-L4 disc space.   Electronically Signed   By: David  SwazilandJordan   On: 09/17/2014 10:04   Dg C-arm 1-60 Min  09/17/2014   CLINICAL DATA:  L4-5 foraminotomy  EXAM: DG C-ARM 61-120 MIN; LUMBAR SPINE - 1 VIEW  COMPARISON:  None.  FLUOROSCOPY TIME:  Radiation Exposure Index (as provided by the fluoroscopic device):  If the device does not provide the exposure index:  Fluoroscopy Time:  5 seconds  Number of Acquired Images:  0  FINDINGS: Single cross-table lateral view of the lower lumbar spine demonstrates posterior surgical instruments directed at the L4-5 and L5-S1 levels.  IMPRESSION: Intraoperative localization as above.   Electronically Signed   By: Charlett NoseKevin  Dover M.D.   On: 09/17/2014 11:19    Scheduled Meds: . amLODipine  10 mg Oral Daily  . aspirin EC  81 mg Oral QHS  .  ceFAZolin (ANCEF) IV  1 g Intravenous Q8H  . colesevelam  1,875 mg Oral BID WC  . diazepam      . ezetimibe  10 mg Oral Daily  . feeding supplement (ENSURE COMPLETE)  237 mL Oral BID BM  . heparin  5,000 Units Subcutaneous 3 times per day  . insulin aspart  0-9 Units Subcutaneous TID WC  . linagliptin  5 mg Oral Daily  . lisinopril  10 mg Oral Daily  . methocarbamol  500 mg Oral TID  . polyethylene glycol  17 g  Oral Daily  . rosuvastatin  20 mg Oral Daily  . sodium chloride  3 mL Intravenous Q12H  . vancomycin       Continuous Infusions: . sodium chloride    . sodium chloride 75 mL/hr at 09/17/14 1551    Principal Problem:   Lumbar stenosis with neurogenic claudication Active Problems:   Lumbar radiculopathy   Peripheral arterial disease   FTT (failure to thrive) in adult   Generalized weakness   AKI (acute kidney injury)   Dehydration   HTN (hypertension), benign   DM (diabetes mellitus) type II uncontrolled, periph vascular disorder    Time spent: 40 mins    [facility] EastHOMPSON,Deylan Canterbury MD [facility] Pager [phone]. If 7PM-7AM, please contact night-coverage at [email or web address], password [email or web address] 09/17/2014, 5:16 PM  LOS: 10 days

## 2014-09-17 NOTE — Anesthesia Procedure Notes (Signed)
Procedure Name: Intubation Date/Time: 09/17/2014 7:51 AM Performed by: Fransisca KaufmannMEYER, Brynna Dobos E Pre-anesthesia Checklist: Patient identified, Emergency Drugs available, Suction available, Patient being monitored and Timeout performed Patient Re-evaluated:Patient Re-evaluated prior to inductionOxygen Delivery Method: Circle system utilized Preoxygenation: Pre-oxygenation with 100% oxygen Intubation Type: IV induction Ventilation: Mask ventilation without difficulty Laryngoscope Size: Miller and 2 Grade View: Grade II Tube type: Oral Tube size: 7.5 mm Number of attempts: 1 Airway Equipment and Method: Stylet Placement Confirmation: ETT inserted through vocal cords under direct vision,  positive ETCO2 and breath sounds checked- equal and bilateral Secured at: 22 cm Tube secured with: Tape Dental Injury: Teeth and Oropharynx as per pre-operative assessment

## 2014-09-17 NOTE — Progress Notes (Signed)
Patient ID: Tina [name], female   DOB: 12/13/1930, 81 y.o.   MRN: [id number] [place]bel. No weakness. C/o incisional pain

## 2014-09-18 LAB — CBC
HEMATOCRIT: 32.3 % — AB (ref 36.0–46.0)
Hemoglobin: 10.6 g/dL — ABNORMAL LOW (ref 12.0–15.0)
MCH: 33.3 pg (ref 26.0–34.0)
MCHC: 32.8 g/dL (ref 30.0–36.0)
MCV: 101.6 fL — ABNORMAL HIGH (ref 78.0–100.0)
PLATELETS: 266 10*3/uL (ref 150–400)
RBC: 3.18 MIL/uL — ABNORMAL LOW (ref 3.87–5.11)
RDW: 14 % (ref 11.5–15.5)
WBC: 16.7 10*3/uL — ABNORMAL HIGH (ref 4.0–10.5)

## 2014-09-18 LAB — GLUCOSE, CAPILLARY
GLUCOSE-CAPILLARY: 172 mg/dL — AB (ref 70–99)
GLUCOSE-CAPILLARY: 209 mg/dL — AB (ref 70–99)
Glucose-Capillary: 183 mg/dL — ABNORMAL HIGH (ref 70–99)
Glucose-Capillary: 192 mg/dL — ABNORMAL HIGH (ref 70–99)
Glucose-Capillary: 171 mg/dL — ABNORMAL HIGH (ref 70–99)

## 2014-09-18 LAB — BASIC METABOLIC PANEL
Anion gap: 9 (ref 5–15)
BUN: 12 mg/dL (ref 6–23)
CO2: 18 mmol/L — ABNORMAL LOW (ref 19–32)
CREATININE: 0.66 mg/dL (ref 0.50–1.10)
Calcium: 8.9 mg/dL (ref 8.4–10.5)
Chloride: 105 mmol/L (ref 96–112)
GFR calc Af Amer: >90 mL/min (ref >90)
GFR, EST NON AFRICAN AMERICAN: 81 mL/min — AB (ref >90)
GLUCOSE: 195 mg/dL — AB (ref 70–99)
POTASSIUM: 6.2 mmol/L — AB (ref 3.5–5.1)
SODIUM: 132 mmol/L — AB (ref 135–145)

## 2014-09-18 LAB — POTASSIUM: Potassium: 4.9 mmol/L (ref 3.5–5.1)

## 2014-09-18 MED ORDER — SODIUM CHLORIDE 0.9 % IV SOLN
INTRAVENOUS | Status: AC
  Administered 2014-09-18: 12:00:00 via INTRAVENOUS
  Filled 2014-09-18: qty 1000

## 2014-09-18 MED ORDER — HEPARIN SODIUM (PORCINE) 5000 UNIT/ML IJ SOLN: 5000.0000 [IU] | Freq: Three times a day (TID) | INTRAMUSCULAR | Status: DC

## 2014-09-18 MED ORDER — SODIUM CHLORIDE 0.9 % IV BOLUS (SEPSIS)
250.0000 mL | Freq: Once | INTRAVENOUS | Status: AC
  Administered 2014-09-18: 250 mL via INTRAVENOUS

## 2014-09-18 NOTE — Progress Notes (Signed)
Occupational Therapy Treatment Patient Details Name: Tina [name] MRN: [id number] DOB: 10/31/1930 Today's Date: 09/18/2014    History of present illness Patient is a 81 y/o female with PMH of lumbar stenosis with neurogenic claudication, admitted in 06/2013 for severe back pain; s/p bil 2,3,4,5 laminectomy and foraminotomies decompression Pt [facility]07/2014 with LLE pain and weakness. (reports 4 falls at home due to Lt knee buckling) S/p caudal epidural injection 3/16. [facility]. lumbar myelogram + Lt L4-5 foraminal stenosis; 3/23 L4-5 foraminotomy. PMH of anxiety, PVD, DM, HTN and Hep A.     OT comments  Pt participated in AAROM bil. UEs with max encouragement.  She resisted all attempts to move EOB, screaming "I don't want to", "leave me alone".   Pt did swing at therapist when I attempted to move her to EOB.  Explained need for increased activity and risks associated with immobility, but pt still refusing - unsure if she, cognitively, is able to understand the risks.   Pt was independent and lived alone PTA, this is a significant change from her baseline.  Will continue attempts at OT   Follow Up Recommendations  SNF    Equipment Recommendations  None recommended by OT    Recommendations for Other Services      Precautions / Restrictions Precautions Precautions: Back;Fall Precaution Booklet Issued: No (Pt with confusion and agitation )       Mobility Bed Mobility Overal bed mobility: Needs Assistance;+2 for physical assistance Bed Mobility: Rolling Rolling: Total assist Sidelying to sit: Max assist;+2 for physical assistance;HOB elevated (HOB elevated once her feet were off EOB)     Sit to sidelying: Mod assist;+2 for physical assistance General bed mobility comments: Pt rolled to Rt with total A, but then began yelling stating "I don't want to, leave me alone". Pt then swung at therapist   Transfers Overall transfer level: Needs  assistance Equipment used: None Transfers: Lateral/Scoot Transfers          Lateral/Scoot Transfers: Max assist;+2 physical assistance General transfer comment: unable     Balance   Sitting-balance support: Bilateral upper extremity supported;Feet supported Sitting balance-Leahy Scale: Poor                             ADL Overall ADL's : Needs assistance/impaired                                       General ADL Comments: Pt requires toatl A for all aspects of BADLs.  Pt resisting activity repeatedly stating "I don't want to, leave me alone".  Max encouragement provided, and explained in detail risks of immobility and not working with therapies, but pt continues to resist activity, and swings at therapist when further activity attempted       Vision                     Perception     Praxis      Cognition   Behavior During Therapy: Anxious Overall Cognitive Status: Impaired/Different from baseline Area of Impairment: Attention;Following commands;Problem solving   Current Attention Level: Focused;Sustained Memory: Decreased recall of precautions  Following Commands: Follows one step commands inconsistently;Follows one step commands with increased time Safety/Judgement: Decreased awareness of safety;Decreased awareness of deficits   Problem Solving: Difficulty sequencing;Requires verbal cues;Requires tactile  cues General Comments: Pt lethargic, but will open her eyes when spoken to.  She followed one step commands inconsistently for UE AAROM, but requires max encouragement.  Pt unable, would not engage in full conversation    Extremity/Trunk Assessment               Exercises Other Exercises Other Exercises: Pt performed AAROM bil. UEs with maximal encouragement x 10 reps each UE    Shoulder Instructions       General Comments      Pertinent Vitals/ Pain       Pain Assessment: Faces Pain Score: 10-Worst pain  ever Faces Pain Scale: Hurts worst Pain Location: back  Pain Intervention(s): Limited activity within patient's tolerance  Home Living Family/patient expects to be discharged to:: Skilled nursing facility                                 Additional Comments: Pt lives alone in 2 level home with shower on second level. Reports increased difficulty walking and getting around the last 3-4 weeks with increased falls.      Prior Functioning/Environment Level of Independence: Needs assistance            Frequency Min 1X/week     Progress Toward Goals  OT Goals(current goals can now be found in the care plan section)  Progress towards OT goals: Not progressing toward goals - comment (Pt continues to resist activity )  Acute Rehab OT Goals Patient Stated Goal: pt unable to state; highly anxious and incr pain ADL Goals Pt Will Perform Grooming: with set-up;sitting Pt Will Perform Upper Body Bathing: with set-up;sitting Pt Will Perform Upper Body Dressing: with set-up;sitting Pt Will Transfer to Toilet: with supervision;stand pivot transfer;bedside commode Additional ADL Goal #1: Pt will perform bed mobility using log roll technique with Supervision to prepare for ADLs.   Plan Discharge plan remains appropriate    Co-evaluation                 End of Session     Activity Tolerance Patient limited by pain;Other (comment) (cognition )   Patient Left in bed;with call bell/phone within reach;with bed alarm set   Nurse Communication Mobility status        Time: 9562-1308 OT Time Calculation (min): 17 min  Charges: OT General Charges $OT Visit: 1 Procedure OT Treatments $Therapeutic Activity: 8-22 mins  Mellonie Guess M 09/18/2014, 1:22 PM

## 2014-09-18 NOTE — Progress Notes (Signed)
Thank you for consult on Ms. Tina [name]. Chart reviewed and note that patient underwent decompressive laminectomy yesterday. She has required much encouragement with minimal participation with therapy attempts. Will await PT/OT evaluations to help activity tolerance as well as determine best rehab venue past discharge.

## 2014-09-18 NOTE — Progress Notes (Signed)
Patient ID: Tina [name], female   DOB: 08/21/1930, 81 y.o.   MRN: [id number]  resting on bed, feels pain all over. No weakness. To start heparin sq. Needs more activity. Rehab to see

## 2014-09-18 NOTE — Op Note (Signed)
NAMEDONNIA, POPLASKI NO.:  [id number]  MEDICAL RECORD NO.:  [id number]  LOCATION:                                FACILITY:  [facility]  PHYSICIAN:  Hilda Lias, M.D.   DATE OF BIRTH:  11/29/1930  DATE OF PROCEDURE:  09/17/2014 DATE OF DISCHARGE:                              OPERATIVE REPORT   PREOPERATIVE DIAGNOSES: 1. Left L4-L5 radiculopathy secondary to foraminal narrowing stenosis. 2. Status post lumbar laminectomy. 3. Obesity. 4. Dementia.  POSTOPERATIVE DIAGNOSES: 1. Left L4-L5 radiculopathy secondary to foraminal narrowing stenosis. 2. Status post lumbar laminectomy. 3. Obesity. 4. Dementia.  PROCEDURE:  Left L4-L5 laminotomy, foraminotomy, and lysis of adhesion. Microscope.  SURGEON:  Hilda Lias, M.D.  ASSISTANT:  Donalee Citrin, M.D.  CLINICAL HISTORY:  Ms. Santellan is a lady, who in the past underwent lumbar laminectomy.  She was admitted to [facility] last week because of worsening pain down to the left leg.  She had failed with conservative treatment.  Myelogram when she was transferred here at this week showed that she has stenosis at L4-L5 to the left with severe degenerative disk disease.  The patient wanted to proceed with surgery. I talked to her and her brother and daughter.  I talked to them about the risk with the surgery including the possibility of infection, CSF leak, no improvement, whatsoever.  OPERATIVE NOTE:  The patient was taken to the OR and after intubation, she was positioned in a prone manner.  Immediately, we found that she had quite a bit of dry stools in the perirectal  area and genitalia.  It took [place] at least half an hour to clean her completely.  The wound was cleaned with Betadine and later with DuraPrep.  Drapes were applied. Because of her obesity, it was difficult to feel any bony structure.  We did a midline incision through the previous one through the skin and subcutaneous tissue.  We brought the x-ray  machine, but we were unable to see the area.  We had to bring the C-arm which showed that indeed we were right at the level of L4-L5.  Then dissection was carried out all the way down to the facet.  The patient had some lamina and laminotomy was accomplished.  We found quite a bit of scar tissue and lysis was accomplished.  We retracted the thecal sac.  We found first the foramina of L4 and then foramina of L5.  Dissection was carried down using the drill as well as the 1 and 2 mm Kerrison punch.  We did a foraminotomy to decompress the L4 and L5 nerve root as well as at the S1.  From then on, the area was irrigated.  There was a small arachnoid pouch, which was taken care with the bipolar.  The area was irrigated.  Surgical glue was left in the epidural space.  Vancomycin was left in the operative site and the wound was closed with several layers of Vicryl and staples.  We are going to talk to the nurse just to be sure that she can be cleaned at least twice a week to avoid any infection.  ______________________________ Hilda LiasErnesto Vinson Tietze, M.D.     EB/MEDQ  D:  09/17/2014  T:  09/17/2014  Job:  [id number]

## 2014-09-18 NOTE — Progress Notes (Signed)
Physical Therapy Treatment Patient Details Name: Tina [name] MRN: [id number] DOB: 22-May-1931 Today's Date: 09/18/2014    History of Present Illness Patient is a 81 y/o female with PMH of lumbar stenosis with neurogenic claudication, admitted in 06/2013 for severe back pain; s/p bil 2,3,4,5 laminectomy and foraminotomies decompression Pt adm to [facility] 09/07/2014 with LLE pain and weakness. (reports 4 falls at home due to Lt knee buckling) S/p caudal epidural injection 3/16. Transfer to [facility] hospital 3/17. lumbar myelogram + Lt L4-5 foraminal stenosis; 3/23 L4-5 foraminotomy. PMH of anxiety, PVD, DM, HTN and Hep A.      PT Comments    Pt now s/p back surgery, however overall assessment/evaluation completed pre-op is unchanged. Pt reports severe back pain persists and is so internally distracted/agitated that she is unable to fully follow PT instructions for improving mobility and protecting her back. Noted on admission to [facility], pt was lucid and speech was fluent. Unclear if current mental status is due to anesthesia or medicine effects??   Follow Up Recommendations  SNF;Supervision/Assistance - 24 hour     Equipment Recommendations  None recommended by PT    Recommendations for Other Services OT consult     Precautions / Restrictions Precautions Precautions: Back;Fall Precaution Booklet Issued: No (confused, anxious, screaming out)    Mobility  Bed Mobility Overal bed mobility: Needs Assistance;+2 for physical assistance Bed Mobility: Rolling;Sidelying to Sit;Sit to Sidelying Rolling: Max assist;+2 for physical assistance Sidelying to sit: Max assist;+2 for physical assistance;HOB elevated (HOB elevated once her feet were off EOB)     Sit to sidelying: Mod assist;+2 for physical assistance General bed mobility comments: requires constant redirection to task as pt distracted by pain and agitation  Transfers Overall transfer level: Needs assistance Equipment used:  None Transfers: Lateral/Scoot Transfers          Lateral/Scoot Transfers: Max assist;+2 physical assistance General transfer comment: Lateral scoot to pt's Rt along EOB; used bedpad under pelvis to assist; pt with poor atttempt to weight bear through legs to assist  Ambulation/Gait                 Stairs            Wheelchair Mobility    Modified Rankin (Stroke Patients Only)       Balance   Sitting-balance support: Bilateral upper extremity supported;Feet supported Sitting balance-Leahy Scale: Poor                              Cognition Arousal/Alertness: Awake/alert Behavior During Therapy: Anxious Overall Cognitive Status: No family/caregiver present to determine baseline cognitive functioning Area of Impairment: Following commands;Safety/judgement;Problem solving     Memory: Decreased recall of precautions Following Commands: Follows one step commands with increased time;Follows one step commands inconsistently Safety/Judgement: Decreased awareness of safety;Decreased awareness of deficits   Problem Solving: Difficulty sequencing;Requires verbal cues;Requires tactile cues General Comments: Initially pt repeatedly saying, "hurry up and help me up" As begin to mobilize, screams out in pain; curses and accuses staff of wanting to hurt her; becomes so agitated/anxious she cannot focus on following instructions    Exercises      General Comments        Pertinent Vitals/Pain Pain Assessment: 0-10 Pain Score: 10-Worst pain ever Pain Location: back and Lt knee Pain Intervention(s): Limited activity within patient's tolerance;Monitored during session;Repositioned;Patient requesting pain meds-RN notified;RN gave pain meds during session    Home Living  Prior Function            PT Goals (current goals can now be found in the care plan section) Acute Rehab PT Goals Patient Stated Goal: pt unable to state;  highly anxious and incr pain PT Goal Formulation: Patient unable to participate in goal setting Time For Goal Achievement: 10/02/14 Potential to Achieve Goals: Fair Progress towards PT goals: Not progressing toward goals - comment;Goals downgraded-see care plan    Frequency  Min 5X/week    PT Plan Current plan remains appropriate;Frequency needs to be updated    Co-evaluation             End of Session   Activity Tolerance: Patient limited by pain;Treatment limited secondary to agitation Patient left: in bed;with nursing/sitter in room (nursing providing care due to incontinence)     Time: [id number] PT Time Calculation (min) (ACUTE ONLY): 18 min  Charges:  $Therapeutic Activity: 8-22 mins                    G Codes:      Tina [name] 09/18/2014, 12:54 PM Pager [phone]

## 2014-09-18 NOTE — Progress Notes (Signed)
[facility] PROGRESS NOTE  Tina [name] YNW:[id number] DOB: 05/02/1931 DOA: 09/07/2014 PCP: Fredirick MaudlinHAWKINS,EDWARD L, MD  Assessment/Plan: #1 lumbar stenosis with left leg radiculopathy/neurogenic claudication Status post left L4-5 foraminotomy per Dr. Jeral FruitBotero 09/17/2014. Pain management. Per NS.  #2 hypertension Stable. Continue Norvasc and lisinopril  #3 diabetes mellitus Continue trajenta. Sliding scale insulin.  #4 hyperlipidemia Continue Crestor.  #5 anxiety Xanax as needed.  #6 urinary frequency Urinalysis negative  #7 prophylaxis Heparin for DVT prophylaxis.  Code Status: Full Family Communication: Updated patient of family at bedside. Disposition Plan: Likely need SNF on discharge.   Consultants:  Neurosurgery: Dr. Jeral FruitBotero 09/12/2014  Procedures:  CT lumbar spine 09/15/2014  X-ray of the lumbar spine 09/17/2014  Myelography of the L-spine 09/15/2014  Left L4-5 foraminotomy per Dr. Jeral FruitBotero 09/17/2014  Antibiotics:  None  HPI/Subjective: Patient asking for help with her tooth brush.  Objective: Filed Vitals:   09/18/14 0952  BP: 147/55  Pulse: 90  Temp: 97.8 F (36.6 C)  Resp: 18    Intake/Output Summary (Last 24 hours) at 09/18/14 1158 Last data filed at 09/17/14 1406  Gross per 24 hour  Intake  79.25 ml  Output    820 ml  Net -740.75 ml   Filed Weights   09/07/14 1619 09/07/14 2128  Weight: 104.327 kg (230 lb) 101.651 kg (224 lb 1.6 oz)    Exam:   General:  NAD  Cardiovascular: RRR  Respiratory: CTAB anterior lung fields.  Abdomen: Soft, nontender, nondistended, positive bowel sounds.  Musculoskeletal: No clubbing cyanosis or edema.  Data Reviewed: Basic Metabolic Panel:  Recent Labs Lab 09/12/14 1215 09/14/14 0805 09/17/14 0610 09/18/14 0647 09/18/14 0846  NA 140 139 135 132*  --   K 4.8 4.0 5.0 6.2* 4.9  CL 108 105 106 105  --   CO2 23 28 21  18*  --   GLUCOSE 126* 196* 131* 195*  --   BUN 12 15 19 12   --    CREATININE 0.74 0.80 0.81 0.66  --   CALCIUM 9.1 9.2 9.2 8.9  --    Liver Function Tests:  Recent Labs Lab 09/12/14 1215 09/14/14 0805 09/17/14 0610  AST 33 36 27  ALT 26 36* 40*  ALKPHOS 67 72 81  BILITOT 0.6 0.6 1.2  PROT 6.0 6.1 6.4  ALBUMIN 3.1* 3.0* 2.8*   No results for input(s): LIPASE, AMYLASE in the last 168 hours. No results for input(s): AMMONIA in the last 168 hours. CBC:  Recent Labs Lab 09/13/14 0606 09/17/14 0610 09/18/14 0844  WBC 11.8* 20.6* 16.7*  HGB 12.4 12.6 10.6*  HCT 38.4 38.1 32.3*  MCV 102.7* 101.6* 101.6*  PLT 407* 301 266   Cardiac Enzymes: No results for input(s): CKTOTAL, CKMB, CKMBINDEX, TROPONINI in the last 168 hours. BNP (last 3 results) No results for input(s): BNP in the last 8760 hours.  ProBNP (last 3 results) No results for input(s): PROBNP in the last 8760 hours.  CBG:  Recent Labs Lab 09/17/14 1144 09/17/14 1628 09/17/14 2232 09/18/14 0646 09/18/14 1149  GLUCAP 174* 207* 147* 183* 192*    Recent Results (from the past 240 hour(s))  Surgical pcr screen     Status: Abnormal   Collection Time: 09/17/14  6:44 AM  Result Value Ref Range Status   MRSA, PCR NEGATIVE NEGATIVE Final   Staphylococcus aureus POSITIVE (A) NEGATIVE Final    Comment:        The Xpert SA Assay (FDA approved for NASAL specimens in  patients over 71 years of age), is one component of a comprehensive surveillance program.  Test performance has been validated by [facility] for patients greater than or equal to 49 year old. It is not intended to diagnose infection nor to guide or monitor treatment.      Studies: Dg Lumbar Spine 1 View  2014/09/20   CLINICAL DATA:  L4-5 foraminotomy  EXAM: DG C-ARM 61-120 MIN; LUMBAR SPINE - 1 VIEW  COMPARISON:  None.  FLUOROSCOPY TIME:  Radiation Exposure Index (as provided by the fluoroscopic device):  If the device does not provide the exposure index:  Fluoroscopy Time:  5 seconds  Number of Acquired  Images:  0  FINDINGS: Single cross-table lateral view of the lower lumbar spine demonstrates posterior surgical instruments directed at the L4-5 and L5-S1 levels.  IMPRESSION: Intraoperative localization as above.   Electronically Signed   By: Charlett Nose M.D.   On: 09/20/2014 11:19   Dg Lumbar Spine 1 View  2014-09-20   CLINICAL DATA:  Left L4-5 foraminotomy  EXAM: LUMBAR SPINE - 1 VIEW  COMPARISON:  Myelogram of September 15, 2014 and lumbar spine series of August 19, 2014  FINDINGS: The images are suboptimal and it is not possible to accurately localize the lumbosacral junction. Numbering was performed using the lower most rib-bearing vertebral body as T12. The metallic trocar appears to lie approximately 1.5 cm posterior to the L3-L4 disc level.  IMPRESSION: Surgical metallic localization device lying approximately 1.5 cm posterior to the L3-L4 disc space.   Electronically Signed   By: David  [place]   On: 09-20-2014 10:04   Dg C-arm 1-60 Min  September 20, 2014   CLINICAL DATA:  L4-5 foraminotomy  EXAM: DG C-ARM 61-120 MIN; LUMBAR SPINE - 1 VIEW  COMPARISON:  None.  FLUOROSCOPY TIME:  Radiation Exposure Index (as provided by the fluoroscopic device):  If the device does not provide the exposure index:  Fluoroscopy Time:  5 seconds  Number of Acquired Images:  0  FINDINGS: Single cross-table lateral view of the lower lumbar spine demonstrates posterior surgical instruments directed at the L4-5 and L5-S1 levels.  IMPRESSION: Intraoperative localization as above.   Electronically Signed   By: Charlett Nose M.D.   On: September 20, 2014 11:19    Scheduled Meds: . amLODipine  10 mg Oral Daily  . aspirin EC  81 mg Oral QHS  . colesevelam  1,875 mg Oral BID WC  . ezetimibe  10 mg Oral Daily  . feeding supplement (ENSURE COMPLETE)  237 mL Oral BID BM  . heparin  5,000 Units Subcutaneous 3 times per day  . insulin aspart  0-9 Units Subcutaneous TID WC  . linagliptin  5 mg Oral Daily  . lisinopril  10 mg Oral Daily  .  methocarbamol  500 mg Oral TID  . polyethylene glycol  17 g Oral Daily  . rosuvastatin  20 mg Oral Daily  . sodium chloride  250 mL Intravenous Once  . sodium chloride  3 mL Intravenous Q12H   Continuous Infusions: . sodium chloride    . sodium chloride 75 mL/hr at 09/20/14 1551  . sodium chloride 0.9 % 1,000 mL infusion      Principal Problem:   Lumbar stenosis with neurogenic claudication Active Problems:   Lumbar radiculopathy   Peripheral arterial disease   FTT (failure to thrive) in adult   Generalized weakness   AKI (acute kidney injury)   Dehydration   HTN (hypertension), benign   DM (diabetes  mellitus) type II uncontrolled, periph vascular disorder    Time spent: 40 mins    [facility] MD [facility] Pager [phone]. If 7PM-7AM, please contact night-coverage at [email or web address], password [facility] 09/18/2014, 11:58 AM  LOS: 11 days

## 2014-09-19 ENCOUNTER — Inpatient Hospital Stay (HOSPITAL_COMMUNITY): Payer: Medicare Other

## 2014-09-19 LAB — CBC
HCT: 33.6 % — ABNORMAL LOW (ref 36.0–46.0)
Hemoglobin: 10.8 g/dL — ABNORMAL LOW (ref 12.0–15.0)
MCH: 33.1 pg (ref 26.0–34.0)
MCHC: 32.1 g/dL (ref 30.0–36.0)
MCV: 103.1 fL — AB (ref 78.0–100.0)
PLATELETS: 244 10*3/uL (ref 150–400)
RBC: 3.26 MIL/uL — ABNORMAL LOW (ref 3.87–5.11)
RDW: 13.9 % (ref 11.5–15.5)
WBC: 16.3 10*3/uL — ABNORMAL HIGH (ref 4.0–10.5)

## 2014-09-19 LAB — BASIC METABOLIC PANEL
ANION GAP: 10 (ref 5–15)
BUN: 8 mg/dL (ref 6–23)
CO2: 19 mmol/L (ref 19–32)
Calcium: 9.1 mg/dL (ref 8.4–10.5)
Chloride: 104 mmol/L (ref 96–112)
Creatinine, Ser: 0.59 mg/dL (ref 0.50–1.10)
GFR calc Af Amer: >90 mL/min (ref >90)
GFR calc non Af Amer: 84 mL/min — ABNORMAL LOW (ref >90)
Glucose, Bld: 186 mg/dL — ABNORMAL HIGH (ref 70–99)
Potassium: 4.2 mmol/L (ref 3.5–5.1)
Sodium: 133 mmol/L — ABNORMAL LOW (ref 135–145)

## 2014-09-19 LAB — GLUCOSE, CAPILLARY
GLUCOSE-CAPILLARY: 196 mg/dL — AB (ref 70–99)
Glucose-Capillary: 186 mg/dL — ABNORMAL HIGH (ref 70–99)
Glucose-Capillary: 230 mg/dL — ABNORMAL HIGH (ref 70–99)
Glucose-Capillary: 219 mg/dL — ABNORMAL HIGH (ref 70–99)

## 2014-09-19 NOTE — Progress Notes (Signed)
[facility] PROGRESS NOTE/consult  DIETRA STOKELY YNW:[id number] DOB: 1930-07-10 DOA: 09/07/2014 PCP: Fredirick Maudlin, MD  Assessment/Plan: #1 lumbar stenosis with left leg radiculopathy/neurogenic claudication Status post left L4-5 foraminotomy per Dr. Jeral Fruit 09/17/2014. Pain management. Per NS.  #2 hypertension Stable. Continue Norvasc and lisinopril  #3 diabetes mellitus Continue trajenta. Sliding scale insulin.  #4 hyperlipidemia Continue Crestor.  #5 anxiety Xanax as needed.  #6 urinary frequency Urinalysis negative  #7 prophylaxis Heparin for DVT prophylaxis.  Code Status: Full Family Communication: Updated patient, no family at bedside. Disposition Plan: Likely need SNF on discharge.   Consultants:  Neurosurgery: Dr. Jeral Fruit 09/12/2014  Procedures:  CT lumbar spine 09/15/2014  X-ray of the lumbar spine 09/17/2014  Myelography of the L-spine 09/15/2014  Left L4-5 foraminotomy per Dr. Jeral Fruit 09/17/2014  Antibiotics:  None  HPI/Subjective: Patient c/o pain.  Objective: Filed Vitals:   09/19/14 1301  BP: 159/61  Pulse: 96  Temp: 97.6 F (36.4 C)  Resp: 18    Intake/Output Summary (Last 24 hours) at 09/19/14 1536 Last data filed at 09/19/14 1301  Gross per 24 hour  Intake    120 ml  Output      0 ml  Net    120 ml   Filed Weights   09/07/14 1619 09/07/14 2128  Weight: 104.327 kg (230 lb) 101.651 kg (224 lb 1.6 oz)    Exam:   General:  NAD  Cardiovascular: RRR  Respiratory: CTAB anterior lung fields.  Abdomen: Soft, nontender, nondistended, positive bowel sounds.  Musculoskeletal: No clubbing cyanosis or edema.  Data Reviewed: Basic Metabolic Panel:  Recent Labs Lab 09/14/14 0805 09/17/14 0610 09/18/14 0647 09/18/14 0846 09/19/14 0625  NA 139 135 132*  --  133*  K 4.0 5.0 6.2* 4.9 4.2  CL 105 106 105  --  104  CO2 28 21 18*  --  19  GLUCOSE 196* 131* 195*  --  186*  BUN --  8  CREATININE 0.80  0.81 0.66  --  0.59  CALCIUM 9.2 9.2 8.9  --  9.1   Liver Function Tests:  Recent Labs Lab 09/14/14 0805 09/17/14 0610  AST 36 27  ALT 36* 40*  ALKPHOS 72 81  BILITOT 0.6 1.2  PROT 6.1 6.4  ALBUMIN 3.0* 2.8*   No results for input(s): LIPASE, AMYLASE in the last 168 hours. No results for input(s): AMMONIA in the last 168 hours. CBC:  Recent Labs Lab 09/13/14 0606 09/17/14 0610 09/18/14 0844 09/19/14 0611  WBC 11.8* 20.6* 16.7* 16.3*  HGB 12.4 12.6 10.6* 10.8*  HCT 38.4 38.1 32.3* 33.6*  MCV 102.7* 101.6* 101.6* 103.1*  PLT 407* 301 266 244   Cardiac Enzymes: No results for input(s): CKTOTAL, CKMB, CKMBINDEX, TROPONINI in the last 168 hours. BNP (last 3 results) No results for input(s): BNP in the last 8760 hours.  ProBNP (last 3 results) No results for input(s): PROBNP in the last 8760 hours.  CBG:  Recent Labs Lab 09/18/14 1634 09/18/14 2049 09/18/14 2211 09/19/14 0644 09/19/14 1111  GLUCAP 172* 171* 209* 186* 230*    Recent Results (from the past 240 hour(s))  Surgical pcr screen     Status: Abnormal   Collection Time: 09/17/14  6:44 AM  Result Value Ref Range Status   MRSA, PCR NEGATIVE NEGATIVE Final   Staphylococcus aureus POSITIVE (A) NEGATIVE Final    Comment:        The Xpert SA Assay (FDA approved for NASAL specimens  in patients over 79 years of age), is one component of a comprehensive surveillance program.  Test performance has been validated by [facility] for patients greater than or equal to 79 year old. It is not intended to diagnose infection nor to guide or monitor treatment.      Studies: Dg Chest Port 1 View  09/19/2014   CLINICAL DATA:  Leukocytosis.  EXAM: PORTABLE CHEST - 1 VIEW  COMPARISON:  07/05/2013.  FINDINGS: Mediastinum and hilar structures normal. Mild bibasilar subsegmental atelectasis and/or scarring again noted. Minimal infiltrate in the left base cannot be excluded. No pleural effusion or pneumothorax. Heart  size normal. No acute bony abnormality .  IMPRESSION: Mild bibasilar subsegmental atelectasis and/or scarring again noted. Minimal infiltrate in the left lung base cannot be excluded.   Electronically Signed   By: Maisie Fushomas  Register   On: 09/19/2014 09:34    Scheduled Meds: . amLODipine  10 mg Oral Daily  . aspirin EC  81 mg Oral QHS  . colesevelam  1,875 mg Oral BID WC  . ezetimibe  10 mg Oral Daily  . feeding supplement (ENSURE COMPLETE)  237 mL Oral BID BM  . heparin  5,000 Units Subcutaneous 3 times per day  . insulin aspart  0-9 Units Subcutaneous TID WC  . linagliptin  5 mg Oral Daily  . lisinopril  10 mg Oral Daily  . methocarbamol  500 mg Oral TID  . polyethylene glycol  17 g Oral Daily  . rosuvastatin  20 mg Oral Daily  . sodium chloride  3 mL Intravenous Q12H   Continuous Infusions: . sodium chloride    . sodium chloride 0.9 % 1,000 mL infusion 75 mL/hr at 09/18/14 1145    Principal Problem:   Lumbar stenosis with neurogenic claudication Active Problems:   Lumbar radiculopathy   Peripheral arterial disease   FTT (failure to thrive) in adult   Generalized weakness   AKI (acute kidney injury)   Dehydration   HTN (hypertension), benign   DM (diabetes mellitus) type II uncontrolled, periph vascular disorder    Time spent: 40 mins    [facility] Center[facility]OMPSON,Latrena Benegas MD [facility] Pager [phone]. If 7PM-7AM, please contact night-coverage at [email or web address], password [email or web address] 09/19/2014, 3:36 PM  LOS: 12 days

## 2014-09-19 NOTE — Progress Notes (Signed)
Patient ID: Tina [name], female   DOB: 02/23/1931, 81 y.o.   MRN: [id number] Vital signs are stable Dementia continues to be a significant issue Patient will ultimately need skilled nursing facility placement.

## 2014-09-19 NOTE — Progress Notes (Signed)
Patient continues to have poor activity tolerance and is refusing/unable to tolerate therapy. Recommend SNF for follow up therapy and will sign off.

## 2014-09-19 NOTE — Clinical Social Work Note (Signed)
CSW reviewed chart. Pt is experiencing confusion which is not her base line. Given this fact and past behavioral issues at multiple SNF, [facility] [place] requested to see the pt before accepting the pt. [place].   CSW received a call from [place]bie at [facility] [place]. Debbie reported she can offer the pt a bed. CSW will continue to follow and assist this pt.   Ellwood Steidle, MSW, LCSWA [phone]

## 2014-09-19 NOTE — Progress Notes (Signed)
Chaplain initiated visit with pt and family. Chaplain introduced herself and her services. Chaplain offered to keep pt in her prayers. Pt started to fall asleep. Pt brother, Harlin HeysRollins, appreciative of visit. Page chaplain as needed.    09/19/14 1300  Clinical Encounter Type  Visited With Patient and family together  Visit Type Initial;Spiritual support  Spiritual Encounters  Spiritual Needs Emotional;Prayer  Stress Factors  Patient Stress Factors Exhausted  Davita Sublett, Mayer MaskerCourtney F, Chaplain 09/19/2014 1:32 PM

## 2014-09-19 NOTE — Progress Notes (Signed)
Physical Therapy Treatment Patient Details Name: Tina [name] MRN: [id number] DOB: 07/01/1930 Today's Date: 09/19/2014    History of Present Illness Patient is a 81 y/o female with PMH of lumbar stenosis with neurogenic claudication, admitted in 06/2013 for severe back pain; s/p bil 2,3,4,5 laminectomy and foraminotomies decompression Pt adm to [facility] 09/07/2014 with LLE pain and weakness. (reports 4 falls at home due to Lt knee buckling) S/p caudal epidural injection 3/16. Transfer to [facility] hospital 3/17. lumbar myelogram + Lt L4-5 foraminal stenosis; 3/23 L4-5 foraminotomy. PMH of anxiety, PVD, DM, HTN and Hep A.      PT Comments    Patient continues with increased agitation and frustration. Patient cursing and swinging at nursing staff. +3 transfer patient to recliner to reposition. Educated on lift back to bed. Continue to recommend SNF for ongoing Physical Therapy.     Follow Up Recommendations  SNF;Supervision/Assistance - 24 hour     Equipment Recommendations  None recommended by PT    Recommendations for Other Services       Precautions / Restrictions Precautions Precaution Comments: extremely anxious; confabulatory    Mobility  Bed Mobility Overal bed mobility: Needs Assistance;+2 for physical assistance Bed Mobility: Rolling Rolling: Total assist Sidelying to sit: Max assist;+2 for physical assistance;HOB elevated       General bed mobility comments: Pt rolled to Rt with total A yelling throughout and swinging out.   Transfers Overall transfer level: Needs assistance Equipment used: None Transfers: Systems analystquat Pivot Transfers          Lateral/Scoot Transfers: +2 physical assistance General transfer comment: A for all aspects (uitlized +3 assist for safety as nurse tech was in room). Patient moaning throughout and unable to stand fully upright  Ambulation/Gait             General Gait Details: unable   Stairs            Wheelchair Mobility     Modified Rankin (Stroke Patients Only)       Balance                                    Cognition Arousal/Alertness: Awake/alert Behavior During Therapy: Anxious Overall Cognitive Status: Impaired/Different from baseline Area of Impairment: Attention;Following commands;Problem solving   Current Attention Level: Focused;Sustained Memory: Decreased recall of precautions Following Commands: Follows one step commands inconsistently;Follows one step commands with increased time Safety/Judgement: Decreased awareness of safety;Decreased awareness of deficits     General Comments: Patient alert and oriented x4 but however unable to have conversation and yelling and hitting at staff    Exercises      General Comments        Pertinent Vitals/Pain Pain Score: 10-Worst pain ever Faces Pain Scale: Hurts worst Pain Location: back Pain Descriptors / Indicators: Sore;Shooting Pain Intervention(s): Premedicated before session;RN gave pain meds during session;Repositioned    Home Living                      Prior Function            PT Goals (current goals can now be found in the care plan section) Progress towards PT goals: Progressing toward goals    Frequency  Min 5X/week    PT Plan Current plan remains appropriate;Frequency needs to be updated    Co-evaluation  End of Session   Activity Tolerance: Treatment limited secondary to agitation;Patient limited by pain Patient left: in chair;with call bell/phone within reach;with chair alarm set     Time: [phone] PT Time Calculation (min) (ACUTE ONLY): 24 min  Charges:  $Therapeutic Activity: 23-37 mins                    G Codes:      Fredrich Birks 09/19/2014, 1:54 PM 09/19/2014 Fredrich Birks PTA [phone] pager [phone] office

## 2014-09-20 DIAGNOSIS — D72829 Elevated white blood cell count, unspecified: Secondary | ICD-10-CM

## 2014-09-20 LAB — CBC WITH DIFFERENTIAL/PLATELET
Basophils Absolute: 0 10*3/uL (ref 0.0–0.1)
Basophils Relative: 0 % (ref 0–1)
Eosinophils Absolute: 0 10*3/uL (ref 0.0–0.7)
Eosinophils Relative: 0 % (ref 0–5)
HCT: 34.4 % — ABNORMAL LOW (ref 36.0–46.0)
HEMOGLOBIN: 11.2 g/dL — AB (ref 12.0–15.0)
LYMPHS ABS: 1.1 10*3/uL (ref 0.7–4.0)
LYMPHS PCT: 7 % — AB (ref 12–46)
MCH: 32.6 pg (ref 26.0–34.0)
MCHC: 32.6 g/dL (ref 30.0–36.0)
MCV: 100 fL (ref 78.0–100.0)
MONO ABS: 1.9 10*3/uL — AB (ref 0.1–1.0)
Monocytes Relative: 12 % (ref 3–12)
NEUTROS ABS: 13.4 10*3/uL — AB (ref 1.7–7.7)
Neutrophils Relative %: 81 % — ABNORMAL HIGH (ref 43–77)
Platelets: 325 10*3/uL (ref 150–400)
RBC: 3.44 MIL/uL — ABNORMAL LOW (ref 3.87–5.11)
RDW: 13.6 % (ref 11.5–15.5)
WBC: 16.5 10*3/uL — ABNORMAL HIGH (ref 4.0–10.5)

## 2014-09-20 LAB — GLUCOSE, CAPILLARY
GLUCOSE-CAPILLARY: 170 mg/dL — AB (ref 70–99)
Glucose-Capillary: 206 mg/dL — ABNORMAL HIGH (ref 70–99)
Glucose-Capillary: 200 mg/dL — ABNORMAL HIGH (ref 70–99)
Glucose-Capillary: 180 mg/dL — ABNORMAL HIGH (ref 70–99)

## 2014-09-20 LAB — BASIC METABOLIC PANEL
Anion gap: 9 (ref 5–15)
BUN: 9 mg/dL (ref 6–23)
CALCIUM: 8.9 mg/dL (ref 8.4–10.5)
CO2: 22 mmol/L (ref 19–32)
Chloride: 104 mmol/L (ref 96–112)
Creatinine, Ser: 0.55 mg/dL (ref 0.50–1.10)
GFR calc non Af Amer: 86 mL/min — ABNORMAL LOW (ref >90)
Glucose, Bld: 214 mg/dL — ABNORMAL HIGH (ref 70–99)
Potassium: 3.9 mmol/L (ref 3.5–5.1)
Sodium: 135 mmol/L (ref 135–145)

## 2014-09-20 NOTE — Progress Notes (Signed)
Patient ID: Tina [name], female   DOB: 11/28/1930, 81 y.o.   MRN: [id number] Sleepy, no weakness. Poor effort to get oob. No family around. Plan to SNF

## 2014-09-20 NOTE — Progress Notes (Signed)
[facility] PROGRESS NOTE/consult  Tina [name] JYN:[id number] DOB: [birth year] DOA: 09/07/2014 PCP: Fredirick Maudlin, MD  Assessment/Plan: #1 lumbar stenosis with left leg radiculopathy/neurogenic claudication Status post left L4-5 foraminotomy per Dr. Jeral Fruit 09/17/2014. Pain management. Per NS.  #2 hypertension Stable. Continue Norvasc and lisinopril  #3 diabetes mellitus Continue trajenta. Sliding scale insulin.  #4 hyperlipidemia Continue Crestor.  #5 anxiety Xanax as needed.  #6 urinary frequency Urinalysis negative  #7 leukocytosis Likely reactive. Chest x-ray negative. Urinalysis is negative. Patient is afebrile. No new, but extend this time. Follow.  #8 prophylaxis Heparin for DVT prophylaxis.  Code Status: Full Family Communication: Updated patient, no family at bedside. Disposition Plan: Per primary team. Needs SNF.   Consultants:  Neurosurgery: Dr. Jeral Fruit 09/12/2014  Procedures:  CT lumbar spine 09/15/2014  X-ray of the lumbar spine 09/17/2014  Myelography of the L-spine 09/15/2014  Left L4-5 foraminotomy per Dr. Jeral Fruit 09/17/2014  Chest x-ray 09/19/2014  Antibiotics:  None  HPI/Subjective: Patient sleeping.  Objective: Filed Vitals:   09/20/14 0910  BP: 154/59  Pulse: 94  Temp: 97.8 F (36.6 C)  Resp: 18    Intake/Output Summary (Last 24 hours) at 09/20/14 1249 Last data filed at 09/19/14 1301  Gross per 24 hour  Intake    120 ml  Output      0 ml  Net    120 ml   Filed Weights   09/07/14 1619 09/07/14 2128  Weight: 104.327 kg (230 lb) 101.651 kg (224 lb 1.6 oz)    Exam:   General:  NAD  Cardiovascular: RRR  Respiratory: CTAB anterior lung fields.  Abdomen: Soft, nontender, nondistended, positive bowel sounds.  Musculoskeletal: No clubbing cyanosis or edema.  Data Reviewed: Basic Metabolic Panel:  Recent Labs Lab 09/14/14 0805 09/17/14 0610 09/18/14 0647 09/18/14 0846 09/19/14 0625 09/20/14 0540   NA 139 135 132*  --  133* 135  K 4.0 5.0 6.2* 4.9 4.2 3.9  CL 105 106 105  --  104 104  CO2 28 21 18*  --  19 22  GLUCOSE 196* 131* 195*  --  186* 214*  BUN --  8 9  CREATININE 0.80 0.81 0.66  --  0.59 0.55  CALCIUM 9.2 9.2 8.9  --  9.1 8.9   Liver Function Tests:  Recent Labs Lab 09/14/14 0805 09/17/14 0610  AST 36 27  ALT 36* 40*  ALKPHOS 72 81  BILITOT 0.6 1.2  PROT 6.1 6.4  ALBUMIN 3.0* 2.8*   No results for input(s): LIPASE, AMYLASE in the last 168 hours. No results for input(s): AMMONIA in the last 168 hours. CBC:  Recent Labs Lab 09/17/14 0610 09/18/14 0844 09/19/14 0611 09/20/14 1018  WBC 20.6* 16.7* 16.3* 16.5*  NEUTROABS  --   --   --  13.4*  HGB 12.6 10.6* 10.8* 11.2*  HCT 38.1 32.3* 33.6* 34.4*  MCV 101.6* 101.6* 103.1* 100.0  PLT 301 266 244 325   Cardiac Enzymes: No results for input(s): CKTOTAL, CKMB, CKMBINDEX, TROPONINI in the last 168 hours. BNP (last 3 results) No results for input(s): BNP in the last 8760 hours.  ProBNP (last 3 results) No results for input(s): PROBNP in the last 8760 hours.  CBG:  Recent Labs Lab 09/19/14 1111 09/19/14 1620 09/19/14 2111 09/20/14 0628 09/20/14 1117  GLUCAP 230* 196* 219* 206* 200*    Recent Results (from the past 240 hour(s))  Surgical pcr screen     Status: Abnormal   Collection  Time: 09/17/14  6:44 AM  Result Value Ref Range Status   MRSA, PCR NEGATIVE NEGATIVE Final   Staphylococcus aureus POSITIVE (A) NEGATIVE Final    Comment:        The Xpert SA Assay (FDA approved for NASAL specimens in patients over 79 years of age), is one component of a comprehensive surveillance program.  Test performance has been validated by [facility] for patients greater than or equal to 79 year old. It is not intended to diagnose infection nor to guide or monitor treatment.      Studies: Dg Chest Port 1 View  09/19/2014   CLINICAL DATA:  Leukocytosis.  EXAM: PORTABLE CHEST - 1 VIEW   COMPARISON:  07/05/2013.  FINDINGS: Mediastinum and hilar structures normal. Mild bibasilar subsegmental atelectasis and/or scarring again noted. Minimal infiltrate in the left base cannot be excluded. No pleural effusion or pneumothorax. Heart size normal. No acute bony abnormality .  IMPRESSION: Mild bibasilar subsegmental atelectasis and/or scarring again noted. Minimal infiltrate in the left lung base cannot be excluded.   Electronically Signed   By: Maisie Fushomas  Register   On: 09/19/2014 09:34    Scheduled Meds: . amLODipine  10 mg Oral Daily  . aspirin EC  81 mg Oral QHS  . colesevelam  1,875 mg Oral BID WC  . ezetimibe  10 mg Oral Daily  . feeding supplement (ENSURE COMPLETE)  237 mL Oral BID BM  . heparin  5,000 Units Subcutaneous 3 times per day  . insulin aspart  0-9 Units Subcutaneous TID WC  . linagliptin  5 mg Oral Daily  . lisinopril  10 mg Oral Daily  . methocarbamol  500 mg Oral TID  . polyethylene glycol  17 g Oral Daily  . rosuvastatin  20 mg Oral Daily  . sodium chloride  3 mL Intravenous Q12H   Continuous Infusions: . sodium chloride      Principal Problem:   Lumbar stenosis with neurogenic claudication Active Problems:   Lumbar radiculopathy   Peripheral arterial disease   FTT (failure to thrive) in adult   Generalized weakness   AKI (acute kidney injury)   Dehydration   HTN (hypertension), benign   DM (diabetes mellitus) type II uncontrolled, periph vascular disorder    Time spent: 40 mins    [facility],Ty Oshima MD [facility] Pager [phone]. If 7PM-7AM, please contact night-coverage at [email or web address], password [email or web address] 09/20/2014, 12:49 PM  LOS: 13 days

## 2014-09-21 LAB — CBC
HEMATOCRIT: 30.8 % — AB (ref 36.0–46.0)
Hemoglobin: 9.8 g/dL — ABNORMAL LOW (ref 12.0–15.0)
MCH: 31.5 pg (ref 26.0–34.0)
MCHC: 31.8 g/dL (ref 30.0–36.0)
MCV: 99 fL (ref 78.0–100.0)
Platelets: 352 10*3/uL (ref 150–400)
RBC: 3.11 MIL/uL — AB (ref 3.87–5.11)
RDW: 14 % (ref 11.5–15.5)
WBC: 13.3 10*3/uL — ABNORMAL HIGH (ref 4.0–10.5)

## 2014-09-21 LAB — BASIC METABOLIC PANEL
Anion gap: 7 (ref 5–15)
BUN: 12 mg/dL (ref 6–23)
CALCIUM: 8.9 mg/dL (ref 8.4–10.5)
CO2: 23 mmol/L (ref 19–32)
Chloride: 106 mmol/L (ref 96–112)
Creatinine, Ser: 0.54 mg/dL (ref 0.50–1.10)
GFR calc Af Amer: >90 mL/min (ref >90)
GFR calc non Af Amer: 86 mL/min — ABNORMAL LOW (ref >90)
Glucose, Bld: 185 mg/dL — ABNORMAL HIGH (ref 70–99)
Potassium: 3.8 mmol/L (ref 3.5–5.1)
Sodium: 136 mmol/L (ref 135–145)

## 2014-09-21 LAB — GLUCOSE, CAPILLARY
GLUCOSE-CAPILLARY: 173 mg/dL — AB (ref 70–99)
Glucose-Capillary: 186 mg/dL — ABNORMAL HIGH (ref 70–99)
Glucose-Capillary: 218 mg/dL — ABNORMAL HIGH (ref 70–99)
Glucose-Capillary: 146 mg/dL — ABNORMAL HIGH (ref 70–99)

## 2014-09-21 MED ORDER — MORPHINE SULFATE 2 MG/ML IJ SOLN
2.0000 mg | Freq: Once | INTRAMUSCULAR | Status: AC
  Administered 2014-09-21: 2 mg via INTRAVENOUS

## 2014-09-21 MED ORDER — BISACODYL 10 MG RE SUPP
10.0000 mg | Freq: Every day | RECTAL | Status: DC | PRN
  Administered 2014-09-21: 10 mg via RECTAL

## 2014-09-21 MED ORDER — SENNOSIDES-DOCUSATE SODIUM 8.6-50 MG PO TABS
1.0000 | ORAL_TABLET | Freq: Every day | ORAL | Status: DC
  Administered 2014-09-21: 1 via ORAL
  Filled 2014-09-21: qty 1

## 2014-09-21 MED ORDER — POLYETHYLENE GLYCOL 3350 17 G PO PACK
17.0000 g | PACK | Freq: Two times a day (BID) | ORAL | Status: DC
  Administered 2014-09-21 – 2014-09-22 (×2): 17 g via ORAL
  Filled 2014-09-21 (×3): qty 1

## 2014-09-21 MED ORDER — WHITE PETROLATUM GEL
Status: AC
  Filled 2014-09-21: qty 1

## 2014-09-21 NOTE — Progress Notes (Signed)
Patient ID: Tina [name], female   DOB: 06/17/1931, 81 y.o.   MRN: [id number] Patient is alert oriented but continually voicing needs for help Continues to request aid in doing all activities At current time her neurologic status appears to be remaining stable Continue supportive care

## 2014-09-21 NOTE — Progress Notes (Signed)
[facility] PROGRESS NOTE/consult  Tina [name] VHQ:[id number] DOB: 1930/09/30 DOA: 09/07/2014 PCP: Fredirick Maudlin, MD  Assessment/Plan: #1 lumbar stenosis with left leg radiculopathy/neurogenic claudication Status post left L4-5 foraminotomy per Dr. Jeral Fruit 09/17/2014. Pain management. Per NS/primary team.  #2 hypertension Stable. Continue Norvasc and lisinopril  #3 diabetes mellitus Continue trajenta. Sliding scale insulin.  #4 hyperlipidemia Continue Crestor.  #5 anxiety Xanax as needed.  #6 urinary frequency Urinalysis negative  #7 leukocytosis Likely reactive. Chest x-ray negative. Urinalysis is negative. Patient is afebrile. Leukocytosis trending down. No need for antibiotics at this time. Follow.  #8 prophylaxis Heparin for DVT prophylaxis.  Code Status: Full Family Communication: Updated patient, no family at bedside. Disposition Plan: Per primary team. Needs SNF.   Consultants:  Neurosurgery: Dr. Jeral Fruit 09/12/2014  Procedures:  CT lumbar spine 09/15/2014  X-ray of the lumbar spine 09/17/2014  Myelography of the L-spine 09/15/2014  Left L4-5 foraminotomy per Dr. Jeral Fruit 09/17/2014  Chest x-ray 09/19/2014  Antibiotics:  None  HPI/Subjective: Patient c/o pain in left leg.  Objective: Filed Vitals:   09/21/14 1322  BP: 150/42  Pulse: 102  Temp: 98.1 F (36.7 C)  Resp: 18    Intake/Output Summary (Last 24 hours) at 09/21/14 1457 Last data filed at 09/20/14 2300  Gross per 24 hour  Intake    240 ml  Output      0 ml  Net    240 ml   Filed Weights   09/07/14 1619 09/07/14 2128  Weight: 104.327 kg (230 lb) 101.651 kg (224 lb 1.6 oz)    Exam:   General:  NAD  Cardiovascular: RRR  Respiratory: CTAB anterior lung fields.  Abdomen: Soft, nontender, nondistended, positive bowel sounds.  Musculoskeletal: No clubbing cyanosis or edema.  Data Reviewed: Basic Metabolic Panel:  Recent Labs Lab 09/17/14 0610  09/18/14 0647 09/18/14 0846 09/19/14 0625 09/20/14 0540 09/21/14 0505  NA 135 132*  --  133* 135 136  K 5.0 6.2* 4.9 4.2 3.9 3.8  CL 106 105  --  104 104 106  CO2 21 18*  --  GLUCOSE 131* 195*  --  186* 214* 185*  BUN 19 12  --  CREATININE 0.81 0.66  --  0.59 0.55 0.54  CALCIUM 9.2 8.9  --  9.1 8.9 8.9   Liver Function Tests:  Recent Labs Lab 09/17/14 0610  AST 27  ALT 40*  ALKPHOS 81  BILITOT 1.2  PROT 6.4  ALBUMIN 2.8*   No results for input(s): LIPASE, AMYLASE in the last 168 hours. No results for input(s): AMMONIA in the last 168 hours. CBC:  Recent Labs Lab 09/17/14 0610 09/18/14 0844 09/19/14 0611 09/20/14 1018 09/21/14 0505  WBC 20.6* 16.7* 16.3* 16.5* 13.3*  NEUTROABS  --   --   --  13.4*  --   HGB 12.6 10.6* 10.8* 11.2* 9.8*  HCT 38.1 32.3* 33.6* 34.4* 30.8*  MCV 101.6* 101.6* 103.1* 100.0 99.0  PLT 301 266 244 325 352   Cardiac Enzymes: No results for input(s): CKTOTAL, CKMB, CKMBINDEX, TROPONINI in the last 168 hours. BNP (last 3 results) No results for input(s): BNP in the last 8760 hours.  ProBNP (last 3 results) No results for input(s): PROBNP in the last 8760 hours.  CBG:  Recent Labs Lab 09/20/14 1117 09/20/14 1632 09/20/14 2222 09/21/14 0657 09/21/14 1058  GLUCAP 200* 170* 180* 173* 186*    Recent Results (from the past 240 hour(s))  Surgical  pcr screen     Status: Abnormal   Collection Time: 09/17/14  6:44 AM  Result Value Ref Range Status   MRSA, PCR NEGATIVE NEGATIVE Final   Staphylococcus aureus POSITIVE (A) NEGATIVE Final    Comment:        The Xpert SA Assay (FDA approved for NASAL specimens in patients over 79 years of age), is one component of a comprehensive surveillance program.  Test performance has been validated by [facility] for patients greater than or equal to 79 year old. It is not intended to diagnose infection nor to guide or monitor treatment.      Studies: No results  found.  Scheduled Meds: . amLODipine  10 mg Oral Daily  . aspirin EC  81 mg Oral QHS  . colesevelam  1,875 mg Oral BID WC  . ezetimibe  10 mg Oral Daily  . feeding supplement (ENSURE COMPLETE)  237 mL Oral BID BM  . heparin  5,000 Units Subcutaneous 3 times per day  . insulin aspart  0-9 Units Subcutaneous TID WC  . linagliptin  5 mg Oral Daily  . lisinopril  10 mg Oral Daily  . methocarbamol  500 mg Oral TID  .  morphine injection  2 mg Intravenous Once  . polyethylene glycol  17 g Oral BID  . rosuvastatin  20 mg Oral Daily  . senna-docusate  1 tablet Oral QHS  . sodium chloride  3 mL Intravenous Q12H   Continuous Infusions: . sodium chloride      Principal Problem:   Lumbar stenosis with neurogenic claudication Active Problems:   Lumbar radiculopathy   Peripheral arterial disease   FTT (failure to thrive) in adult   Generalized weakness   AKI (acute kidney injury)   Dehydration   HTN (hypertension), benign   DM (diabetes mellitus) type II uncontrolled, periph vascular disorder   Leukocytosis    Time spent: 40 mins    [facility],Ellicia Alix MD [facility] Pager [phone]. If 7PM-7AM, please contact night-coverage at [email or web address], password [email or web address] 09/21/2014, 2:57 PM  LOS: 14 days

## 2014-09-22 ENCOUNTER — Encounter (HOSPITAL_COMMUNITY): Payer: Self-pay | Admitting: Neurosurgery

## 2014-09-22 LAB — CBC
HEMATOCRIT: 29.1 % — AB (ref 36.0–46.0)
HEMOGLOBIN: 9.4 g/dL — AB (ref 12.0–15.0)
MCH: 31.9 pg (ref 26.0–34.0)
MCHC: 32.3 g/dL (ref 30.0–36.0)
MCV: 98.6 fL (ref 78.0–100.0)
Platelets: 372 10*3/uL (ref 150–400)
RBC: 2.95 MIL/uL — AB (ref 3.87–5.11)
RDW: 13.9 % (ref 11.5–15.5)
WBC: 13.5 10*3/uL — ABNORMAL HIGH (ref 4.0–10.5)

## 2014-09-22 LAB — BASIC METABOLIC PANEL
ANION GAP: 8 (ref 5–15)
BUN: 13 mg/dL (ref 6–23)
CALCIUM: 8.9 mg/dL (ref 8.4–10.5)
CO2: 24 mmol/L (ref 19–32)
CREATININE: 0.64 mg/dL (ref 0.50–1.10)
Chloride: 105 mmol/L (ref 96–112)
GFR calc non Af Amer: 81 mL/min — ABNORMAL LOW (ref >90)
GLUCOSE: 154 mg/dL — AB (ref 70–99)
Potassium: 3.6 mmol/L (ref 3.5–5.1)
Sodium: 137 mmol/L (ref 135–145)

## 2014-09-22 LAB — GLUCOSE, CAPILLARY
GLUCOSE-CAPILLARY: 164 mg/dL — AB (ref 70–99)
Glucose-Capillary: 164 mg/dL — ABNORMAL HIGH (ref 70–99)

## 2014-09-22 NOTE — Discharge Summary (Signed)
Physician Discharge Summary  Patient ID: [name] Rosemeyer MRN: [id number] DOB/AGE: 79/08/1930 79 y.o.  Admit date: 09/07/2014 Discharge date: 09/22/2014  Admission Diagnoses:lumbar stenosis  Discharge Diagnoses:  Principal Problem:   Lumbar stenosis with neurogenic claudication Active Problems:   Peripheral arterial disease   FTT (failure to thrive) in adult   Generalized weakness   AKI (acute kidney injury)   Dehydration   Lumbar radiculopathy   HTN (hypertension), benign   DM (diabetes mellitus) type II uncontrolled, periph vascular disorder   Leukocytosis   Discharged Condition: no left leg pain  Hospital Course: surgery  Consults:hospitalist  Significant Diagnostic Studies: myelogram  Treatments:left l45 laminotomies  Discharge Exam: Blood pressure 138/66, pulse 90, temperature 97.9 F (36.6 C), temperature source Oral, resp. rate 18, height 5\' 3"  (1.6 m), weight 101.651 kg (224 lb 1.6 oz), SpO2 96 %. Wound dry. No pain as preop  Disposition: ED Dismiss - Never Arrived     Medication List    ASK your doctor about these medications        acetaminophen 500 MG tablet  Commonly known as:  TYLENOL  Take 1,000 mg by mouth every 6 (six) hours as needed for mild pain or headache.     amLODipine 10 MG tablet  Commonly known as:  NORVASC  Take 10 mg by mouth daily.     aspirin EC 81 MG tablet  Take 81 mg by mouth at bedtime.     colesevelam 625 MG tablet  Commonly known as:  WELCHOL  Take 1,875 mg by mouth 2 (two) times daily with a meal.     diazepam 5 MG tablet  Commonly known as:  VALIUM  Take 1 tablet (5 mg total) by mouth every 6 (six) hours as needed (spasms).     diclofenac 75 MG EC tablet  Commonly known as:  VOLTAREN  Take 75 mg by mouth 2 (two) times daily.     ezetimibe 10 MG tablet  Commonly known as:  ZETIA  Take 10 mg by mouth daily.     lanolin ointment  Apply 1 application topically at bedtime.     lisinopril 10 MG tablet   Commonly known as:  PRINIVIL,ZESTRIL  Take 10 mg by mouth daily.     omeprazole 40 MG capsule  Commonly known as:  PRILOSEC  Take 40 mg by mouth daily.     phenylephrine 1 % nasal spray  Commonly known as:  NEO-SYNEPHRINE  Place 1 drop into both nostrils 3 (three) times daily as needed for congestion.     pioglitazone-glimepiride 30-4 MG per tablet  Commonly known as:  DUETACT  Take 1 tablet by mouth daily with breakfast.     rosuvastatin 20 MG tablet  Commonly known as:  CRESTOR  Take 20 mg by mouth daily.     sitaGLIPtin 100 MG tablet  Commonly known as:  JANUVIA  Take 100 mg by mouth daily.       to a SNF   LUMBAR STAPLES TO BE REMOVED IN 10 DAYS  Signed: Karn CassisBOTERO,Zaki Gertsch M 09/22/2014, 10:26 AM

## 2014-09-22 NOTE — Progress Notes (Signed)
Patient is discharged from room 4N32 at this time. Alert and in stable condition. IV site d/c'd. [facility] [place]. Transported via stretcher via [facility].

## 2014-09-27 ENCOUNTER — Emergency Department (HOSPITAL_COMMUNITY): Payer: Medicare Other

## 2014-09-27 ENCOUNTER — Inpatient Hospital Stay (HOSPITAL_COMMUNITY): Payer: Medicare Other

## 2014-09-27 ENCOUNTER — Inpatient Hospital Stay (HOSPITAL_COMMUNITY)
Admission: EM | Admit: 2014-09-27 | Discharge: 2014-09-30 | DRG: 470 | Disposition: A | Discharge status: Skilled Nursing Facility | Payer: Medicare Other | Attending: Internal Medicine | Admitting: Internal Medicine

## 2014-09-27 ENCOUNTER — Encounter (HOSPITAL_COMMUNITY): Payer: Self-pay | Admitting: Emergency Medicine

## 2014-09-27 DIAGNOSIS — D539 Nutritional anemia, unspecified: Secondary | ICD-10-CM | POA: Diagnosis not present

## 2014-09-27 DIAGNOSIS — M25552 Pain in left hip: Secondary | ICD-10-CM

## 2014-09-27 DIAGNOSIS — Z79899 Other long term (current) drug therapy: Secondary | ICD-10-CM | POA: Diagnosis not present

## 2014-09-27 DIAGNOSIS — K219 Gastro-esophageal reflux disease without esophagitis: Secondary | ICD-10-CM | POA: Diagnosis present

## 2014-09-27 DIAGNOSIS — I10 Essential (primary) hypertension: Secondary | ICD-10-CM

## 2014-09-27 DIAGNOSIS — E876 Hypokalemia: Secondary | ICD-10-CM | POA: Diagnosis not present

## 2014-09-27 DIAGNOSIS — S72012A Unspecified intracapsular fracture of left femur, initial encounter for closed fracture: Principal | ICD-10-CM | POA: Diagnosis present

## 2014-09-27 DIAGNOSIS — S72012D Unspecified intracapsular fracture of left femur, subsequent encounter for closed fracture with routine healing: Secondary | ICD-10-CM | POA: Diagnosis not present

## 2014-09-27 DIAGNOSIS — F419 Anxiety disorder, unspecified: Secondary | ICD-10-CM | POA: Diagnosis not present

## 2014-09-27 DIAGNOSIS — R52 Pain, unspecified: Secondary | ICD-10-CM

## 2014-09-27 DIAGNOSIS — D649 Anemia, unspecified: Secondary | ICD-10-CM

## 2014-09-27 DIAGNOSIS — R4182 Altered mental status, unspecified: Secondary | ICD-10-CM | POA: Diagnosis not present

## 2014-09-27 DIAGNOSIS — Z9181 History of falling: Secondary | ICD-10-CM

## 2014-09-27 DIAGNOSIS — R443 Hallucinations, unspecified: Secondary | ICD-10-CM | POA: Diagnosis present

## 2014-09-27 DIAGNOSIS — E118 Type 2 diabetes mellitus with unspecified complications: Secondary | ICD-10-CM

## 2014-09-27 DIAGNOSIS — Z87891 Personal history of nicotine dependence: Secondary | ICD-10-CM | POA: Diagnosis not present

## 2014-09-27 DIAGNOSIS — M4806 Spinal stenosis, lumbar region: Secondary | ICD-10-CM

## 2014-09-27 DIAGNOSIS — Z7982 Long term (current) use of aspirin: Secondary | ICD-10-CM

## 2014-09-27 DIAGNOSIS — S72002A Fracture of unspecified part of neck of left femur, initial encounter for closed fracture: Secondary | ICD-10-CM | POA: Diagnosis present

## 2014-09-27 DIAGNOSIS — G9340 Encephalopathy, unspecified: Secondary | ICD-10-CM | POA: Diagnosis not present

## 2014-09-27 DIAGNOSIS — E860 Dehydration: Secondary | ICD-10-CM

## 2014-09-27 DIAGNOSIS — W19XXXA Unspecified fall, initial encounter: Secondary | ICD-10-CM | POA: Diagnosis not present

## 2014-09-27 DIAGNOSIS — D72829 Elevated white blood cell count, unspecified: Secondary | ICD-10-CM | POA: Diagnosis present

## 2014-09-27 DIAGNOSIS — S72012G Unspecified intracapsular fracture of left femur, subsequent encounter for closed fracture with delayed healing: Secondary | ICD-10-CM | POA: Diagnosis not present

## 2014-09-27 DIAGNOSIS — R54 Age-related physical debility: Secondary | ICD-10-CM | POA: Diagnosis not present

## 2014-09-27 DIAGNOSIS — E1165 Type 2 diabetes mellitus with hyperglycemia: Secondary | ICD-10-CM | POA: Diagnosis present

## 2014-09-27 DIAGNOSIS — Z96649 Presence of unspecified artificial hip joint: Secondary | ICD-10-CM

## 2014-09-27 DIAGNOSIS — F0390 Unspecified dementia without behavioral disturbance: Secondary | ICD-10-CM | POA: Diagnosis present

## 2014-09-27 DIAGNOSIS — I739 Peripheral vascular disease, unspecified: Secondary | ICD-10-CM | POA: Diagnosis not present

## 2014-09-27 DIAGNOSIS — E119 Type 2 diabetes mellitus without complications: Secondary | ICD-10-CM

## 2014-09-27 DIAGNOSIS — M48062 Spinal stenosis, lumbar region with neurogenic claudication: Secondary | ICD-10-CM | POA: Diagnosis present

## 2014-09-27 DIAGNOSIS — S72019A Unspecified intracapsular fracture of unspecified femur, initial encounter for closed fracture: Secondary | ICD-10-CM | POA: Diagnosis present

## 2014-09-27 DIAGNOSIS — G8918 Other acute postprocedural pain: Secondary | ICD-10-CM

## 2014-09-27 LAB — CBC
HCT: 31 % — ABNORMAL LOW (ref 36.0–46.0)
Hemoglobin: 10.1 g/dL — ABNORMAL LOW (ref 12.0–15.0)
MCH: 32.7 pg (ref 26.0–34.0)
MCHC: 32.6 g/dL (ref 30.0–36.0)
MCV: 100.3 fL — AB (ref 78.0–100.0)
PLATELETS: 463 10*3/uL — AB (ref 150–400)
RBC: 3.09 MIL/uL — ABNORMAL LOW (ref 3.87–5.11)
RDW: 14.4 % (ref 11.5–15.5)
WBC: 15.9 10*3/uL — ABNORMAL HIGH (ref 4.0–10.5)

## 2014-09-27 LAB — COMPREHENSIVE METABOLIC PANEL
ALK PHOS: 79 U/L (ref 39–117)
ALT: 17 U/L (ref 0–35)
ANION GAP: 11 (ref 5–15)
AST: 17 U/L (ref 0–37)
Albumin: 2.7 g/dL — ABNORMAL LOW (ref 3.5–5.2)
BUN: 28 mg/dL — ABNORMAL HIGH (ref 6–23)
CHLORIDE: 110 mmol/L (ref 96–112)
CO2: 17 mmol/L — ABNORMAL LOW (ref 19–32)
CREATININE: 1.05 mg/dL (ref 0.50–1.10)
Calcium: 8.6 mg/dL (ref 8.4–10.5)
GFR, EST AFRICAN AMERICAN: 56 mL/min — AB (ref >90)
GFR, EST NON AFRICAN AMERICAN: 48 mL/min — AB (ref >90)
GLUCOSE: 111 mg/dL — AB (ref 70–99)
Potassium: 3.4 mmol/L — ABNORMAL LOW (ref 3.5–5.1)
Sodium: 138 mmol/L (ref 135–145)
Total Bilirubin: 0.6 mg/dL (ref 0.3–1.2)
Total Protein: 6.2 g/dL (ref 6.0–8.3)

## 2014-09-27 LAB — DIFFERENTIAL
BASOS PCT: 0 % (ref 0–1)
Basophils Absolute: 0.1 10*3/uL (ref 0.0–0.1)
Eosinophils Absolute: 0.2 10*3/uL (ref 0.0–0.7)
Eosinophils Relative: 2 % (ref 0–5)
Lymphocytes Relative: 9 % — ABNORMAL LOW (ref 12–46)
Lymphs Abs: 1.5 10*3/uL (ref 0.7–4.0)
Monocytes Absolute: 1.1 10*3/uL — ABNORMAL HIGH (ref 0.1–1.0)
Monocytes Relative: 7 % (ref 3–12)
Neutro Abs: 13 10*3/uL — ABNORMAL HIGH (ref 1.7–7.7)
Neutrophils Relative %: 82 % — ABNORMAL HIGH (ref 43–77)

## 2014-09-27 LAB — URINALYSIS, ROUTINE W REFLEX MICROSCOPIC
Bilirubin Urine: NEGATIVE
Glucose, UA: NEGATIVE mg/dL
Hgb urine dipstick: NEGATIVE
Leukocytes, UA: NEGATIVE
Nitrite: NEGATIVE
PH: 5.5 (ref 5.0–8.0)
Protein, ur: NEGATIVE mg/dL
Specific Gravity, Urine: 1.025 (ref 1.005–1.030)
UROBILINOGEN UA: 0.2 mg/dL (ref 0.0–1.0)

## 2014-09-27 LAB — RAPID URINE DRUG SCREEN, HOSP PERFORMED
AMPHETAMINES: NOT DETECTED
BENZODIAZEPINES: POSITIVE — AB
Barbiturates: NOT DETECTED
Cocaine: NOT DETECTED
Opiates: POSITIVE — AB
Tetrahydrocannabinol: NOT DETECTED

## 2014-09-27 LAB — I-STAT TROPONIN, ED: Troponin i, poc: 0.01 ng/mL (ref 0.00–0.08)

## 2014-09-27 LAB — GLUCOSE, CAPILLARY
GLUCOSE-CAPILLARY: 101 mg/dL — AB (ref 70–99)
Glucose-Capillary: 124 mg/dL — ABNORMAL HIGH (ref 70–99)

## 2014-09-27 MED ORDER — SODIUM CHLORIDE 0.9 % IV BOLUS (SEPSIS)
1000.0000 mL | Freq: Once | INTRAVENOUS | Status: AC
  Administered 2014-09-27: 1000 mL via INTRAVENOUS

## 2014-09-27 MED ORDER — SENNA 8.6 MG PO TABS
1.0000 | ORAL_TABLET | Freq: Two times a day (BID) | ORAL | Status: DC
  Administered 2014-09-27 – 2014-09-30 (×5): 8.6 mg via ORAL
  Filled 2014-09-27 (×5): qty 1

## 2014-09-27 MED ORDER — INSULIN ASPART 100 UNIT/ML ~~LOC~~ SOLN: 0.0000 [IU] | Freq: Every day | SUBCUTANEOUS | Status: DC

## 2014-09-27 MED ORDER — MORPHINE SULFATE 2 MG/ML IJ SOLN
0.5000 mg | INTRAMUSCULAR | Status: DC | PRN
  Administered 2014-09-27 – 2014-09-28 (×4): 0.5 mg via INTRAVENOUS
  Filled 2014-09-27 (×9): qty 1

## 2014-09-27 MED ORDER — ASPIRIN EC 81 MG PO TBEC
81.0000 mg | DELAYED_RELEASE_TABLET | Freq: Every day | ORAL | Status: DC
  Administered 2014-09-27: 81 mg via ORAL
  Filled 2014-09-27: qty 1

## 2014-09-27 MED ORDER — OXYCODONE-ACETAMINOPHEN 5-325 MG PO TABS: 1.0000 | ORAL_TABLET | Freq: Four times a day (QID) | ORAL | Status: DC | PRN

## 2014-09-27 MED ORDER — ROSUVASTATIN CALCIUM 10 MG PO TABS
20.0000 mg | ORAL_TABLET | Freq: Every day | ORAL | Status: DC
  Administered 2014-09-27 – 2014-09-30 (×3): 20 mg via ORAL
  Filled 2014-09-27 (×2): qty 1
  Filled 2014-09-27 (×2): qty 2

## 2014-09-27 MED ORDER — MORPHINE SULFATE 2 MG/ML IJ SOLN
2.0000 mg | Freq: Once | INTRAMUSCULAR | Status: AC
Start: 2014-09-27 — End: 2014-09-27
  Administered 2014-09-27: 2 mg via INTRAVENOUS
  Filled 2014-09-27: qty 1

## 2014-09-27 MED ORDER — INSULIN ASPART 100 UNIT/ML ~~LOC~~ SOLN
0.0000 [IU] | Freq: Three times a day (TID) | SUBCUTANEOUS | Status: DC
  Administered 2014-09-27: 1 [IU] via SUBCUTANEOUS
  Administered 2014-09-28: 2 [IU] via SUBCUTANEOUS
  Administered 2014-09-29: 3 [IU] via SUBCUTANEOUS
  Administered 2014-09-29: 1 [IU] via SUBCUTANEOUS
  Administered 2014-09-29: 2 [IU] via SUBCUTANEOUS
  Administered 2014-09-30: 5 [IU] via SUBCUTANEOUS

## 2014-09-27 MED ORDER — AMLODIPINE BESYLATE 10 MG PO TABS
10.0000 mg | ORAL_TABLET | Freq: Every day | ORAL | Status: DC
  Administered 2014-09-27: 10 mg via ORAL
  Filled 2014-09-27: qty 1

## 2014-09-27 MED ORDER — MORPHINE SULFATE 4 MG/ML IJ SOLN
4.0000 mg | Freq: Once | INTRAMUSCULAR | Status: AC
  Administered 2014-09-27: 4 mg via INTRAVENOUS
  Filled 2014-09-27: qty 1

## 2014-09-27 MED ORDER — MORPHINE SULFATE 2 MG/ML IJ SOLN
2.0000 mg | Freq: Once | INTRAMUSCULAR | Status: AC
  Administered 2014-09-27: 2 mg via INTRAVENOUS
  Filled 2014-09-27: qty 1

## 2014-09-27 MED ORDER — HYDROCODONE-ACETAMINOPHEN 5-325 MG PO TABS
1.0000 | ORAL_TABLET | Freq: Four times a day (QID) | ORAL | Status: DC | PRN
  Administered 2014-09-27: 2 via ORAL
  Administered 2014-09-27: 1 via ORAL
  Administered 2014-09-28 – 2014-09-30 (×9): 2 via ORAL
  Filled 2014-09-27 (×8): qty 2
  Filled 2014-09-27: qty 1
  Filled 2014-09-27 (×3): qty 2

## 2014-09-27 MED ORDER — HEPARIN SODIUM (PORCINE) 5000 UNIT/ML IJ SOLN
5000.0000 [IU] | Freq: Three times a day (TID) | INTRAMUSCULAR | Status: AC
  Administered 2014-09-27 (×2): 5000 [IU] via SUBCUTANEOUS
  Filled 2014-09-27 (×2): qty 1

## 2014-09-27 MED ORDER — LISINOPRIL 10 MG PO TABS
10.0000 mg | ORAL_TABLET | Freq: Every day | ORAL | Status: DC
  Administered 2014-09-27 – 2014-09-30 (×3): 10 mg via ORAL
  Filled 2014-09-27 (×3): qty 1

## 2014-09-27 MED ORDER — PANTOPRAZOLE SODIUM 40 MG PO TBEC
40.0000 mg | DELAYED_RELEASE_TABLET | Freq: Every day | ORAL | Status: DC
  Administered 2014-09-27 – 2014-09-30 (×3): 40 mg via ORAL
  Filled 2014-09-27 (×3): qty 1

## 2014-09-27 NOTE — Anesthesia Preprocedure Evaluation (Addendum)
Anesthesia Evaluation  Patient identified by MRN, date of birth, ID band Patient awake    Reviewed: Allergy & Precautions, NPO status , Patient's Chart, lab work & pertinent test results  Airway Mallampati: II  TM Distance: >3 FB Neck ROM: Full    Dental   Pulmonary shortness of breath, pneumonia -, former smoker,  breath sounds clear to auscultation        Cardiovascular hypertension, Pt. on medications + Peripheral Vascular Disease Rhythm:Regular Rate:Normal     Neuro/Psych    GI/Hepatic GERD-  ,(+) Hepatitis -  Endo/Other  diabetes, Type 2, Oral Hypoglycemic Agents, Insulin DependentMorbid obesity  Renal/GU Renal disease     Musculoskeletal  (+) Arthritis -,   Abdominal   Peds  Hematology  (+) anemia ,   Anesthesia Other Findings   Reproductive/Obstetrics                         Anesthesia Physical  Anesthesia Plan  ASA: III  Anesthesia Plan: Spinal   Post-op Pain Management:    Induction:   Airway Management Planned:   Additional Equipment:   Intra-op Plan:   Post-operative Plan:   Informed Consent: I have reviewed the patients History and Physical, chart, labs and discussed the procedure including the risks, benefits and alternatives for the proposed anesthesia with the patient or authorized representative who has indicated his/her understanding and acceptance.   Dental advisory given  Plan Discussed with: CRNA  Anesthesia Plan Comments: (Discussed risk of GA and spinal. No INR, but pt had normal PTT prior and no hx of easy bleeding. Platelets adequate. )      Anesthesia Quick Evaluation

## 2014-09-27 NOTE — ED Notes (Signed)
Oriented to name, birth date, know family member.  But disoriented to time and place.  Pt constantly asking to go home and go shopping.  Re-oriented several times while in room.  Denies pain at this time.  Brother at bedside.

## 2014-09-27 NOTE — ED Notes (Signed)
Pt c/o pain to left hip.  Rates pain 9/10.

## 2014-09-27 NOTE — ED Notes (Signed)
[facility] in for transfer

## 2014-09-27 NOTE — ED Notes (Addendum)
Pt was found sitting in floor and per nursing staff she is more confused. Nursing staff unsure the time she was last normal. Pt has had recent back surgery and is not ambulatory.

## 2014-09-27 NOTE — ED Notes (Signed)
Called [facility] to give bed assignment. They will send us a truck.

## 2014-09-27 NOTE — ED Notes (Signed)
Pt attempting to pull out IV, placed Kling dressing over site.

## 2014-09-27 NOTE — Progress Notes (Signed)
Orthopedic Tech Progress Note Patient Details:  [name] Perrott 06/09/1931 [id number]  Patient ID: [name] Depaul, female   DOB: 03/24/1931, 81 y.o.   MRN: [id number] Pt unable to use trapeze bar patient helper  Nikki DomCrawford, Wataru Mccowen 09/27/2014, 12:26 PM

## 2014-09-27 NOTE — H&P (Signed)
History and Physical  Tina [name] ZOX:[id number] DOB: 1930-10-02 DOA: 09/27/2014  Referring physician: Dr. Shelly Coss in ED PCP: Fredirick Maudlin, MD   Chief Complaint: fall  HPI:  81 year old woman just discharged 3/28 having undergone L4-5 laminotomies at [facility] for lumbar stenosis with neurogenic claudication who presented to the emergency department after a fall at [facility] nursing home when she was found on the floor. Noted to be confused. Imaging revealed a fracture. No orthopedics coverage available at [facility] and so the patient will be transferred to [facility] for operative intervention.  Review of the patient's last hospitalization was notable for periods of agitation and anxiety, refusal to work with therapy and some outbursts. In the emergency department she was initially noted to be confused but this improved. Apparently she was recently started on antibiotics at the nursing home for leukocytosis without signs of infection.  Discussed in detail with brother as well as the patient's daughter. She has had mild memory loss or several months. This became much worse after fall in March and persisted postoperatively suggesting delirium superimposed on dementia. Patient's history is somewhat questionable, she reports falling last night and denies any pain currently. According to chart, she was found on the floor at the nursing home. Patient denies any symptoms at this point.  In the emergency department afebrile with stable vital signs. No hypoxia. Complete metabolic panel was essentially unremarkable, random glucose was 111. Troponin negative. Leukocytosis stable as is anemia compared to previous studies. Urinalysis was negative and urine drug screen was positive for benzodiazepines and opiates. Imaging including CT head and neck as well as chest x-ray displayed no evidence of acute issues. Hip film did reveal subcapital femoral neck fracture.  Review of Systems:  Negative for fever,  visual changes, sore throat, rash, new muscle aches, chest pain, SOB, dysuria, bleeding, n/v/abdominal pain.  Past Medical History  Diagnosis Date  . Diabetes mellitus   . Hypertension   . Osteopenia   . Peripheral vascular disease, unspecified   . GERD (gastroesophageal reflux disease)   . Arthritis   . Shortness of breath     exersion  . Pneumonia     child  . Anemia     hx  . Hepatitis     "a"  . Fall at home October 09, 2013  . DM (diabetes mellitus) type II uncontrolled, periph vascular disorder 09/17/2014    Past Surgical History  Procedure Laterality Date  . Appendectomy  1994  . Eye surgery Bilateral 08  . Lumbar laminectomy/decompression microdiscectomy N/A 07/09/2013    Procedure: LUMBAR LAMINECTOMY/DECOMPRESSION MICRODISCECTOMY LUMBAR TWO TO LUMBAR FIVE;  Surgeon: Karn Cassis, MD;  Location: [facility] NEURO ORS;  Service: Neurosurgery;  Laterality: N/A;  . Lumbar laminectomy/decompression microdiscectomy Left 09/17/2014    Procedure: Left Lumbar four-five Foraminotomy;  Surgeon: Hilda Lias, MD;  Location: [facility] NEURO ORS;  Service: Neurosurgery;  Laterality: Left;  Left L4-5 Foraminotomy    Social History:  reports that she quit smoking about 53 years ago. Her smoking use included Cigarettes. She has a .3 pack-year smoking history. She has never used smokeless tobacco. She reports that she does not drink alcohol or use illicit drugs.  No Known Allergies  Family History  Problem Relation Age of Onset  . Cancer Mother   . Hypertension Mother   . Heart disease Father     before age 64  . Heart attack Father   . Hyperlipidemia Son      Prior to  Admission medications   Medication Sig Start Date End Date Taking? Authorizing Provider  acetaminophen (TYLENOL) 500 MG tablet Take 1,000 mg by mouth every 6 (six) hours as needed for mild pain or headache.    Historical Provider, MD  amLODipine (NORVASC) 10 MG tablet Take 10 mg by mouth daily.      Historical Provider, MD    aspirin EC 81 MG tablet Take 81 mg by mouth at bedtime.    Historical Provider, MD  colesevelam (WELCHOL) 625 MG tablet Take 1,875 mg by mouth 2 (two) times daily with a meal.     Historical Provider, MD  diclofenac (VOLTAREN) 75 MG EC tablet Take 75 mg by mouth 2 (two) times daily.    Historical Provider, MD  ezetimibe (ZETIA) 10 MG tablet Take 10 mg by mouth daily.      Historical Provider, MD  lanolin ointment Apply 1 application topically at bedtime.    Historical Provider, MD  lisinopril (PRINIVIL,ZESTRIL) 10 MG tablet Take 10 mg by mouth daily.      Historical Provider, MD  omeprazole (PRILOSEC) 40 MG capsule Take 40 mg by mouth daily.      Historical Provider, MD  oxyCODONE-acetaminophen (PERCOCET/ROXICET) 5-325 MG per tablet Take 1 tablet by mouth every 6 (six) hours as needed for severe pain (please use for breakthrough pain only). 09/27/14   Zadie Rhine, MD  phenylephrine (NEO-SYNEPHRINE) 1 % nasal spray Place 1 drop into both nostrils 3 (three) times daily as needed for congestion.    Historical Provider, MD  pioglitazone-glimepiride (DUETACT) 30-4 MG per tablet Take 1 tablet by mouth daily with breakfast.      Historical Provider, MD  rosuvastatin (CRESTOR) 20 MG tablet Take 20 mg by mouth daily.      Historical Provider, MD  sitaGLIPtin (JANUVIA) 100 MG tablet Take 100 mg by mouth daily.      Historical Provider, MD   Physical Exam: Filed Vitals:   09/27/14 0741 09/27/14 0800 09/27/14 0830 09/27/14 0900  BP:  126/51 131/50 138/63  Pulse:  83 83 87  Temp:      TempSrc:      Resp:  SpO2: 100% 96% 98% 98%    General: Appears calm and comfortable Eyes: PERRL, normal lids, irises  ENT: grossly normal hearing, lips  Neck: Appears grossly normal Cardiovascular: RRR, no m/r/g. No LE edema. Respiratory: CTA bilaterally, no w/r/r. Normal respiratory effort. Abdomen: soft, ntnd, extensive bruising noted presumably from DVT prophylaxis injections Skin: Some bruising  noted to the forearm and wrists Musculoskeletal: grossly normal tone BUE/BLE; moves all extremities to command Psychiatric: grossly normal mood and affect, speech fluent. Asks several questions repeatedly. Neurologic: grossly non-focal. Cranial nerves appear intact.  Wt Readings from Last 3 Encounters:  09/07/14 101.651 kg (224 lb 1.6 oz)  09/08/14 101.606 kg (224 lb)  08/18/14 104.327 kg (230 lb)    Labs on Admission:  Basic Metabolic Panel:  Recent Labs Lab 09/21/14 0505 09/22/14 1015 09/27/14 0532  NA 136 137 138  K 3.8 3.6 3.4*  CL 106 105 110  CO2 23 24 17*  GLUCOSE 185* 154* 111*  BUN 12 13 28*  CREATININE 0.54 0.64 1.05  CALCIUM 8.9 8.9 8.6    Liver Function Tests:  Recent Labs Lab 09/27/14 0532  AST 17  ALT 17  ALKPHOS 79  BILITOT 0.6  PROT 6.2  ALBUMIN 2.7*    CBC:  Recent Labs Lab 09/20/14 1018 09/21/14 0505 09/22/14 1015 09/27/14  0532  WBC 16.5* 13.3* 13.5* 15.9*  NEUTROABS 13.4*  --   --  13.0*  HGB 11.2* 9.8* 9.4* 10.1*  HCT 34.4* 30.8* 29.1* 31.0*  MCV 100.0 99.0 98.6 100.3*  PLT 325 352 372 463*    Recent Labs  09/27/14 0541  TROPIPOC 0.01    CBG:  Recent Labs Lab 09/21/14 1058 09/21/14 1617 09/21/14 2218 09/22/14 0706 09/22/14 1220  GLUCAP 186* 218* 146* 164* 164*     Radiological Exams on Admission: Ct Head Wo Contrast  09/27/2014   CLINICAL DATA:  Acute onset of altered mental status and confusion. Dizziness, weakness and headache. Status post fall. Initial encounter.  EXAM: CT HEAD WITHOUT CONTRAST  CT CERVICAL SPINE WITHOUT CONTRAST  TECHNIQUE: Multidetector CT imaging of the head and cervical spine was performed following the standard protocol without intravenous contrast. Multiplanar CT image reconstructions of the cervical spine were also generated.  COMPARISON:  None.  FINDINGS: CT HEAD FINDINGS  There is no evidence of acute infarction, mass lesion, or intra- or extra-axial hemorrhage on CT.  Prominence of the  ventricles and sulci reflects moderate cortical volume loss. Cerebellar atrophy is noted. Scattered periventricular and subcortical white matter change likely reflects small vessel ischemic microangiopathy.  The brainstem and fourth ventricle are within normal limits. The basal ganglia are unremarkable in appearance. The cerebral hemispheres demonstrate grossly normal gray-white differentiation. No mass effect or midline shift is seen.  There is no evidence of fracture; visualized osseous structures are unremarkable in appearance. The orbits are within normal limits. The paranasal sinuses and mastoid air cells are well-aerated. No significant soft tissue abnormalities are seen.  CT CERVICAL SPINE FINDINGS  There is no evidence of fracture or subluxation. Vertebral bodies demonstrate normal height and alignment. Multilevel disc space narrowing is noted along the cervical spine, with scattered anterior and posterior disc osteophyte complexes. Prevertebral soft tissues are within normal limits.  The thyroid gland is unremarkable in appearance. The visualized lung apices are clear. Dense calcification is noted at the carotid bifurcations bilaterally.  IMPRESSION: 1. No evidence of traumatic intracranial injury or fracture. 2. No evidence of fracture or subluxation along the cervical spine. 3. Moderate cortical volume loss and scattered small vessel ischemic microangiopathy. 4. Mild diffuse degenerative change along the cervical spine. 5. Dense calcification at the carotid bifurcations bilaterally. Carotid ultrasound would be helpful for further evaluation, when and as deemed clinically appropriate.   Electronically Signed   By: Roanna RaiderJeffery  Chang M.D.   On: 09/27/2014 06:32   Ct Cervical Spine Wo Contrast  09/27/2014   CLINICAL DATA:  Acute onset of altered mental status and confusion. Dizziness, weakness and headache. Status post fall. Initial encounter.  EXAM: CT HEAD WITHOUT CONTRAST  CT CERVICAL SPINE WITHOUT CONTRAST   TECHNIQUE: Multidetector CT imaging of the head and cervical spine was performed following the standard protocol without intravenous contrast. Multiplanar CT image reconstructions of the cervical spine were also generated.  COMPARISON:  None.  FINDINGS: CT HEAD FINDINGS  There is no evidence of acute infarction, mass lesion, or intra- or extra-axial hemorrhage on CT.  Prominence of the ventricles and sulci reflects moderate cortical volume loss. Cerebellar atrophy is noted. Scattered periventricular and subcortical white matter change likely reflects small vessel ischemic microangiopathy.  The brainstem and fourth ventricle are within normal limits. The basal ganglia are unremarkable in appearance. The cerebral hemispheres demonstrate grossly normal gray-white differentiation. No mass effect or midline shift is seen.  There is no evidence of fracture;  visualized osseous structures are unremarkable in appearance. The orbits are within normal limits. The paranasal sinuses and mastoid air cells are well-aerated. No significant soft tissue abnormalities are seen.  CT CERVICAL SPINE FINDINGS  There is no evidence of fracture or subluxation. Vertebral bodies demonstrate normal height and alignment. Multilevel disc space narrowing is noted along the cervical spine, with scattered anterior and posterior disc osteophyte complexes. Prevertebral soft tissues are within normal limits.  The thyroid gland is unremarkable in appearance. The visualized lung apices are clear. Dense calcification is noted at the carotid bifurcations bilaterally.  IMPRESSION: 1. No evidence of traumatic intracranial injury or fracture. 2. No evidence of fracture or subluxation along the cervical spine. 3. Moderate cortical volume loss and scattered small vessel ischemic microangiopathy. 4. Mild diffuse degenerative change along the cervical spine. 5. Dense calcification at the carotid bifurcations bilaterally. Carotid ultrasound would be helpful for  further evaluation, when and as deemed clinically appropriate.   Electronically Signed   By: Roanna Raider M.D.   On: 09/27/2014 06:32   Dg Pelvis Portable  09/27/2014   CLINICAL DATA:  Status post fall on hard surface floor. Sharp left hip pain. Initial encounter.  EXAM: PORTABLE PELVIS 1-2 VIEWS  COMPARISON:  Lumbar spine CT performed 09/15/2014  FINDINGS: Degenerative change is noted at the left hip joint, with diffuse sclerosis and remodeling of the left femoral head. An underlying femoral head fracture cannot be excluded, but is not definitely seen.  More mild right hip joint sclerosis is seen. Diffuse degenerative change is noted along the lumbar spine. The sacroiliac joints are unremarkable in appearance.  The visualized bowel gas pattern is grossly unremarkable in appearance. Scattered phleboliths are noted within the pelvis.  IMPRESSION: 1. Degenerative change at the left hip joint, with diffuse sclerosis and bony remodeling of the left femoral head. Underlying femoral head fracture cannot be excluded, but is not definitely characterized on this single image. This could be better characterized on dedicated left hip radiographs, as deemed clinically appropriate. 2. Minimal right hip degenerative change noted. Diffuse degenerative change along the lumbar spine.   Electronically Signed   By: Roanna Raider M.D.   On: 09/27/2014 06:23   Dg Chest Portable 1 View  09/27/2014   CLINICAL DATA:  Larey Seat onto hard surface floor. Concern for chest injury. Initial encounter.  EXAM: PORTABLE CHEST - 1 VIEW  COMPARISON:  Chest radiograph performed 09/19/2014  FINDINGS: The lungs are well-aerated. Minimal right-sided atelectasis is noted. There is no evidence of pleural effusion or pneumothorax.  The cardiomediastinal silhouette is mildly enlarged. No acute osseous abnormalities are seen. Degenerative change is noted at the acromioclavicular joints bilaterally.  IMPRESSION: Minimal right-sided atelectasis noted. Mild  cardiomegaly. No displaced rib fracture seen.   Electronically Signed   By: Roanna Raider M.D.   On: 09/27/2014 06:20   Dg Hip Unilat With Pelvis 2-3 Views Left  09/27/2014   CLINICAL DATA:  Left hip pain following fall, initial encounter  EXAM: LEFT HIP (WITH PELVIS) 2-3 VIEWS  COMPARISON:  08/19/2014, 09/27/2014  FINDINGS: There are changes consistent with a subcapital femoral neck fracture with impaction and angulation at the fracture site. The pelvic ring is otherwise intact. Degenerative changes of left hip joint are noted. Degenerative changes of lumbar spine are seen as well.  IMPRESSION: Subcapital femoral neck fracture on the left.  Complete   Electronically Signed   By: Alcide Clever M.D.   On: 09/27/2014 07:28    EKG: Independently reviewed.  Poor quality. Normal sinus rhythm. No acute changes seen.   Principal Problem:   Fracture of femoral neck, left Active Problems:   Lumbar stenosis with neurogenic claudication   Leukocytosis   Encephalopathy   DM type 2 (diabetes mellitus, type 2)   Normocytic anemia   Assessment/Plan 1. Left subcapital femoral neck fracture status post fall. Urinalysis negative. Urine drug screen was positive for benzodiazepines and opiates. CT head and cervical spine unremarkable. Chest x-ray unremarkable. Patient just underwent back surgery less than one month ago without apparent complication. Her laboratory studies are stable she has had no chest pain or shortness of breath. EKG is poor quality but shows sinus rhythm with no acute changes. Major risk factors would be advanced age and stable medical comorbidities. She appears optimized for this intermediate risk procedure. Risks were discussed with daughter by telephone. 2. Leukocytosis that developed after epidural steroid injection injection and has been persistent since 3/23. No signs of infection. Suspect steroid-induced. There is no evidence of infection. Withholding antibiotics at this point. 3. Subacute  encephalopathy. Review of previous hospitalization notes the patient was agitated and anxious at times. 4. Diabetes mellitus type 2 with peripheral neuropathy. Stable. Random glucose 111, anion gap normal. Hemoglobin A1c 5.83/18/2016. 5. Normocytic anemia, likely chronic with superimposed blood loss anemia perioperatively. Stable compared to previous studies. 6. Hypokalemia 7. Lumbar stenosis with neurogenic claudication, status post left L 4-5 laminotomies 3/23. 8. Suspected early dementia with superimposed delirium. Daughter reports mild memory loss for the last several months, much more confusion after fall preceding back surgery which persisted postoperatively.   No orthopedics coverage this weekend. Plan transfer to [facility] to [facility] 6 Dr. Arbutus Leas. Orthopedics Dr. Renaye Rakers will consult, plan surgery 4/3.  Hip fracture protocol  CBC and BMP in the morning  Sliding-scale insulin  Replace potassium  Discussed in detail with daughter [name] and brother Essie Hart; agreeable to transfer to [facility] and plan of care.  Code Status: full code DVT prophylaxis: heparin last dose tonight, then SCDs pending surgery 4/3 Family Communication:  Disposition Plan/Anticipated LOS: admit to [facility], Dr. Arbutus Leas  Time spent: 60 minutes  Brendia Sacks, MD  [facility] Pager [phone] 09/27/2014, 9:50 AM

## 2014-09-27 NOTE — ED Notes (Signed)
MD at bedside.  Pt informed of fracture to left hip, brother present.  Pt not able to answer questions appropriately when ask.  Brother is answering questions for MD.

## 2014-09-27 NOTE — ED Notes (Signed)
Notified April Reid, LPN at [facility] of pt possible admission to hospital.

## 2014-09-27 NOTE — Consult Note (Signed)
ORTHOPAEDIC CONSULTATION  REQUESTING PHYSICIAN: Orson Eva, MD  Chief Complaint: left hip pain  HPI: Tina [name] is a 81 y.o. female who complains of  Left hip pain after a mechanical fall.  The patient was at a [facility] s/p Left L4-5 foraminotoy by Dr. Joya Salm on 09/17/14 when she fell getting out of bed.  She reports landing on the left hip and being unable to weight bear on the leg since.  Patient's family reports that she has been having some memory difficulty since March that has worsened after anaesthesia with her recent back surgery.  The husband reports that prior to her back surgery she was able to ambulate with the aid of a walker.    Past Medical History  Diagnosis Date  . Diabetes mellitus   . Hypertension   . Osteopenia   . Peripheral vascular disease, unspecified   . GERD (gastroesophageal reflux disease)   . Arthritis   . Shortness of breath     exersion  . Pneumonia     child  . Anemia     hx  . Hepatitis     "a"  . Fall at home October 09, 2013  . DM (diabetes mellitus) type II uncontrolled, periph vascular disorder 09/17/2014   Past Surgical History  Procedure Laterality Date  . Appendectomy  1994  . Eye surgery Bilateral 08  . Lumbar laminectomy/decompression microdiscectomy N/A 07/09/2013    Procedure: LUMBAR LAMINECTOMY/DECOMPRESSION MICRODISCECTOMY LUMBAR TWO TO LUMBAR FIVE;  Surgeon: Floyce Stakes, MD;  Location: [facility];  Service: Neurosurgery;  Laterality: N/A;  . Lumbar laminectomy/decompression microdiscectomy Left 09/17/2014    Procedure: Left Lumbar four-five Foraminotomy;  Surgeon: Leeroy Cha, MD;  Location: [facility];  Service: Neurosurgery;  Laterality: Left;  Left L4-5 Foraminotomy   History   Social History  . Marital Status: Widowed    Spouse Name: N/A  . Number of Children: N/A  . Years of Education: N/A   Social History Main Topics  . Smoking status: Former Smoker -- 0.30 packs/day for 1 years    Types: Cigarettes   Quit date: 01/06/1961  . Smokeless tobacco: Never Used  . Alcohol Use: No  . Drug Use: No  . Sexual Activity: No   Other Topics Concern  . None   Social History Narrative   Family History  Problem Relation Age of Onset  . Cancer Mother   . Hypertension Mother   . Heart disease Father     before age 58  . Heart attack Father   . Hyperlipidemia Son    No Known Allergies Prior to Admission medications   Medication Sig Start Date End Date Taking? Authorizing Provider  acetaminophen (TYLENOL) 500 MG tablet Take 1,000 mg by mouth every 6 (six) hours as needed for mild pain or headache.    Historical Provider, MD  amLODipine (NORVASC) 10 MG tablet Take 10 mg by mouth daily.      Historical Provider, MD  aspirin EC 81 MG tablet Take 81 mg by mouth at bedtime.    Historical Provider, MD  colesevelam (WELCHOL) 625 MG tablet Take 1,875 mg by mouth 2 (two) times daily with a meal.     Historical Provider, MD  diclofenac (VOLTAREN) 75 MG EC tablet Take 75 mg by mouth 2 (two) times daily.    Historical Provider, MD  ezetimibe (ZETIA) 10 MG tablet Take 10 mg by mouth daily.      Historical Provider, MD  lanolin  ointment Apply 1 application topically at bedtime.    Historical Provider, MD  lisinopril (PRINIVIL,ZESTRIL) 10 MG tablet Take 10 mg by mouth daily.      Historical Provider, MD  omeprazole (PRILOSEC) 40 MG capsule Take 40 mg by mouth daily.      Historical Provider, MD  oxyCODONE-acetaminophen (PERCOCET/ROXICET) 5-325 MG per tablet Take 1 tablet by mouth every 6 (six) hours as needed for severe pain (please use for breakthrough pain only). 09/27/14   Ripley Fraise, MD  phenylephrine (NEO-SYNEPHRINE) 1 % nasal spray Place 1 drop into both nostrils 3 (three) times daily as needed for congestion.    Historical Provider, MD  pioglitazone-glimepiride (DUETACT) 30-4 MG per tablet Take 1 tablet by mouth daily with breakfast.      Historical Provider, MD  rosuvastatin (CRESTOR) 20 MG tablet Take  20 mg by mouth daily.      Historical Provider, MD  sitaGLIPtin (JANUVIA) 100 MG tablet Take 100 mg by mouth daily.      Historical Provider, MD   Ct Head Wo Contrast  09/27/2014   CLINICAL DATA:  Acute onset of altered mental status and confusion. Dizziness, weakness and headache. Status post fall. Initial encounter.  EXAM: CT HEAD WITHOUT CONTRAST  CT CERVICAL SPINE WITHOUT CONTRAST  TECHNIQUE: Multidetector CT imaging of the head and cervical spine was performed following the standard protocol without intravenous contrast. Multiplanar CT image reconstructions of the cervical spine were also generated.  COMPARISON:  None.  FINDINGS: CT HEAD FINDINGS  There is no evidence of acute infarction, mass lesion, or intra- or extra-axial hemorrhage on CT.  Prominence of the ventricles and sulci reflects moderate cortical volume loss. Cerebellar atrophy is noted. Scattered periventricular and subcortical white matter change likely reflects small vessel ischemic microangiopathy.  The brainstem and fourth ventricle are within normal limits. The basal ganglia are unremarkable in appearance. The cerebral hemispheres demonstrate grossly normal gray-white differentiation. No mass effect or midline shift is seen.  There is no evidence of fracture; visualized osseous structures are unremarkable in appearance. The orbits are within normal limits. The paranasal sinuses and mastoid air cells are well-aerated. No significant soft tissue abnormalities are seen.  CT CERVICAL SPINE FINDINGS  There is no evidence of fracture or subluxation. Vertebral bodies demonstrate normal height and alignment. Multilevel disc space narrowing is noted along the cervical spine, with scattered anterior and posterior disc osteophyte complexes. Prevertebral soft tissues are within normal limits.  The thyroid gland is unremarkable in appearance. The visualized lung apices are clear. Dense calcification is noted at the carotid bifurcations bilaterally.   IMPRESSION: 1. No evidence of traumatic intracranial injury or fracture. 2. No evidence of fracture or subluxation along the cervical spine. 3. Moderate cortical volume loss and scattered small vessel ischemic microangiopathy. 4. Mild diffuse degenerative change along the cervical spine. 5. Dense calcification at the carotid bifurcations bilaterally. Carotid ultrasound would be helpful for further evaluation, when and as deemed clinically appropriate.   Electronically Signed   By: Garald Balding M.D.   On: 09/27/2014 06:32   Ct Cervical Spine Wo Contrast  09/27/2014   CLINICAL DATA:  Acute onset of altered mental status and confusion. Dizziness, weakness and headache. Status post fall. Initial encounter.  EXAM: CT HEAD WITHOUT CONTRAST  CT CERVICAL SPINE WITHOUT CONTRAST  TECHNIQUE: Multidetector CT imaging of the head and cervical spine was performed following the standard protocol without intravenous contrast. Multiplanar CT image reconstructions of the cervical spine were also generated.  COMPARISON:  None.  FINDINGS: CT HEAD FINDINGS  There is no evidence of acute infarction, mass lesion, or intra- or extra-axial hemorrhage on CT.  Prominence of the ventricles and sulci reflects moderate cortical volume loss. Cerebellar atrophy is noted. Scattered periventricular and subcortical white matter change likely reflects small vessel ischemic microangiopathy.  The brainstem and fourth ventricle are within normal limits. The basal ganglia are unremarkable in appearance. The cerebral hemispheres demonstrate grossly normal gray-white differentiation. No mass effect or midline shift is seen.  There is no evidence of fracture; visualized osseous structures are unremarkable in appearance. The orbits are within normal limits. The paranasal sinuses and mastoid air cells are well-aerated. No significant soft tissue abnormalities are seen.  CT CERVICAL SPINE FINDINGS  There is no evidence of fracture or subluxation. Vertebral  bodies demonstrate normal height and alignment. Multilevel disc space narrowing is noted along the cervical spine, with scattered anterior and posterior disc osteophyte complexes. Prevertebral soft tissues are within normal limits.  The thyroid gland is unremarkable in appearance. The visualized lung apices are clear. Dense calcification is noted at the carotid bifurcations bilaterally.  IMPRESSION: 1. No evidence of traumatic intracranial injury or fracture. 2. No evidence of fracture or subluxation along the cervical spine. 3. Moderate cortical volume loss and scattered small vessel ischemic microangiopathy. 4. Mild diffuse degenerative change along the cervical spine. 5. Dense calcification at the carotid bifurcations bilaterally. Carotid ultrasound would be helpful for further evaluation, when and as deemed clinically appropriate.   Electronically Signed   By: Garald Balding M.D.   On: 09/27/2014 06:32   Dg Pelvis Portable  09/27/2014   CLINICAL DATA:  Status post fall on hard surface floor. Sharp left hip pain. Initial encounter.  EXAM: PORTABLE PELVIS 1-2 VIEWS  COMPARISON:  Lumbar spine CT performed 09/15/2014  FINDINGS: Degenerative change is noted at the left hip joint, with diffuse sclerosis and remodeling of the left femoral head. An underlying femoral head fracture cannot be excluded, but is not definitely seen.  More mild right hip joint sclerosis is seen. Diffuse degenerative change is noted along the lumbar spine. The sacroiliac joints are unremarkable in appearance.  The visualized bowel gas pattern is grossly unremarkable in appearance. Scattered phleboliths are noted within the pelvis.  IMPRESSION: 1. Degenerative change at the left hip joint, with diffuse sclerosis and bony remodeling of the left femoral head. Underlying femoral head fracture cannot be excluded, but is not definitely characterized on this single image. This could be better characterized on dedicated left hip radiographs, as  deemed clinically appropriate. 2. Minimal right hip degenerative change noted. Diffuse degenerative change along the lumbar spine.   Electronically Signed   By: Garald Balding M.D.   On: 09/27/2014 06:23   Dg Chest Portable 1 View  09/27/2014   CLINICAL DATA:  [place] onto hard surface floor. Concern for chest injury. Initial encounter.  EXAM: PORTABLE CHEST - 1 VIEW  COMPARISON:  Chest radiograph performed 09/19/2014  FINDINGS: The lungs are well-aerated. Minimal right-sided atelectasis is noted. There is no evidence of pleural effusion or pneumothorax.  The cardiomediastinal silhouette is mildly enlarged. No acute osseous abnormalities are seen. Degenerative change is noted at the acromioclavicular joints bilaterally.  IMPRESSION: Minimal right-sided atelectasis noted. Mild cardiomegaly. No displaced rib fracture seen.   Electronically Signed   By: Garald Balding M.D.   On: 09/27/2014 06:20   Dg Hip Unilat With Pelvis 2-3 Views Left  09/27/2014   CLINICAL DATA:  Left hip pain following  fall, initial encounter  EXAM: LEFT HIP (WITH PELVIS) 2-3 VIEWS  COMPARISON:  08/19/2014, 09/27/2014  FINDINGS: There are changes consistent with a subcapital femoral neck fracture with impaction and angulation at the fracture site. The pelvic ring is otherwise intact. Degenerative changes of left hip joint are noted. Degenerative changes of lumbar spine are seen as well.  IMPRESSION: Subcapital femoral neck fracture on the left.  Complete   Electronically Signed   By: Inez Catalina M.D.   On: 09/27/2014 07:28   Dg Hip Unilat With Pelvis 2-3 Views Right  09/28/2014   CLINICAL DATA:  [address] [address] with new right hip pain  EXAM: RIGHT HIP (WITH PELVIS) 2-3 VIEWS  COMPARISON:  09/27/2014  FINDINGS: The left subcapital hip fracture is again evident. The right hip is intact. The pelvis is intact.  IMPRESSION: Left subcapital hip fracture again evident. No evidence of acute fracture involving the right hip.   Electronically Signed   By: Andreas Newport M.D.   On: 09/28/2014 01:40    Positive ROS: All other systems have been reviewed and were otherwise negative with the exception of those mentioned in the HPI and as above.  Labs cbc  Recent Labs  09/27/14 0532 09/28/14 0513  WBC 15.9* 11.4*  HGB 10.1* 8.6*  HCT 31.0* 27.1*  PLT 463* 437*    Labs inflam No results for input(s): CRP in the last 72 hours.  Invalid input(s): ESR  Labs coag No results for input(s): INR, PTT in the last 72 hours.  Invalid input(s): PT   Recent Labs  09/27/14 0532 09/28/14 0513  NA 138 138  K 3.4* 3.3*  CL 110 110  CO2 17* 19  GLUCOSE 111* 119*  BUN 28* 16  CREATININE 1.05 0.69  CALCIUM 8.6 8.3*    Physical Exam: Filed Vitals:   09/28/14 0608  BP: 149/38  Pulse: 79  Temp: 97.9 F (36.6 C)  Resp: 17   General: Alert, no acute distress Cardiovascular: No pedal edema Respiratory: No cyanosis, no use of accessory musculature GI: No organomegaly, abdomen is soft and non-tender Skin: No lesions in the area of chief complaint other than those listed below in MSK exam.  Neurologic: Sensation intact distally Psychiatric: Patient is competent for consent with normal mood and affect Lymphatic: No axillary or cervical lymphadenopathy  MUSCULOSKELETAL:  Left hip has some echhymosis and swelling but not open lesions Sensation is intact with 2+ pulses distally. Other extremities are atraumatic with painless ROM and NVI.  Assessment: Subcapital femoral neck fracture of the left hip  Plan: Plan to take to the OR 4/3am for left hemiarthroplasty Weight Bearing Status: bedrest/NWB till OR 4/3 PT VTE px: SCD's and chemical prophylaxis held due to OR 4/3   Gae Dry, PA-C Cell [phone]   09/28/2014 7:03 AM

## 2014-09-27 NOTE — ED Provider Notes (Signed)
Plain films of left hip reveal a impacted angulated subcapital fracture left femur.  Discussed with Dr. Wandra Feinstein Murphy. Admit to hospitalist service. Discussed with Dr. Irene LimboGoodrich. Also discussed with family. Pain management.  Donnetta HutchingBrian Chief Walkup, MD 09/27/14 1002

## 2014-09-27 NOTE — ED Provider Notes (Signed)
CSN: [id number]     Arrival date & time 09/27/14  0443 History   First MD Initiated Contact with Patient 09/27/14 0450     Chief Complaint  Patient presents with  . Fall  . Altered Mental Status   LEVEL 5 CAVEAT DUE TO ALTERED MENTAL STATUS   Patient is a 81 y.o. female presenting with fall and altered mental status. The history is provided by the patient and a caregiver. The history is limited by the condition of the patient.  Fall This is a new problem. Episode onset: just prior to arrival. The problem occurs constantly. The problem has not changed since onset.Pertinent negatives include no abdominal pain. Nothing aggravates the symptoms. Nothing relieves the symptoms.  Altered Mental Status Associated symptoms: no abdominal pain   Patient presents from [facility] for fall and altered mental status.  Per nurse from nursing home (she is at bedside) pt has had increasing number of falls.  She reports this morning the patient was found sitting in the floor.  It has also been noted that patient has been more confused over past 48 hours. She has been in nursing facility since 09/22/14. Patient is s/p lumbar laminectomy on 09/17/14.  Currently, pt reports she is having pain in her left leg.  She also reports she feel cold.  Past Medical History  Diagnosis Date  . Diabetes mellitus   . Hypertension   . Osteopenia   . Peripheral vascular disease, unspecified   . GERD (gastroesophageal reflux disease)   . Arthritis   . Shortness of breath     exersion  . Pneumonia     child  . Anemia     hx  . Hepatitis     "a"  . Fall at home October 09, 2013  . DM (diabetes mellitus) type II uncontrolled, periph vascular disorder 09/17/2014   Past Surgical History  Procedure Laterality Date  . Appendectomy  1994  . Eye surgery Bilateral 08  . Lumbar laminectomy/decompression microdiscectomy N/A 07/09/2013    Procedure: LUMBAR LAMINECTOMY/DECOMPRESSION MICRODISCECTOMY LUMBAR TWO TO LUMBAR  FIVE;  Surgeon: Karn Cassis, MD;  Location: [facility];  Service: Neurosurgery;  Laterality: N/A;  . Lumbar laminectomy/decompression microdiscectomy Left 09/17/2014    Procedure: Left Lumbar four-five Foraminotomy;  Surgeon: Hilda Lias, MD;  Location: [facility];  Service: Neurosurgery;  Laterality: Left;  Left L4-5 Foraminotomy   Family History  Problem Relation Age of Onset  . Cancer Mother   . Hypertension Mother   . Heart disease Father     before age 67  . Heart attack Father   . Hyperlipidemia Son    History  Substance Use Topics  . Smoking status: Former Smoker -- 0.30 packs/day for 1 years    Types: Cigarettes    Quit date: 01/06/1961  . Smokeless tobacco: Never Used  . Alcohol Use: No   OB History    Gravida Para Term Preterm AB TAB SAB Ectopic Multiple Living   3 3 3             Review of Systems  Unable to perform ROS: Mental status change  Gastrointestinal: Negative for abdominal pain.      Allergies  Review of patient's allergies indicates no known allergies.  Home Medications   Prior to Admission medications   Medication Sig Start Date End Date Taking? Authorizing Provider  acetaminophen (TYLENOL) 500 MG tablet Take 1,000 mg by mouth every 6 (six) hours as needed for mild pain  or headache.    Historical Provider, MD  amLODipine (NORVASC) 10 MG tablet Take 10 mg by mouth daily.      Historical Provider, MD  aspirin EC 81 MG tablet Take 81 mg by mouth at bedtime.    Historical Provider, MD  colesevelam (WELCHOL) 625 MG tablet Take 1,875 mg by mouth 2 (two) times daily with a meal.     Historical Provider, MD  diclofenac (VOLTAREN) 75 MG EC tablet Take 75 mg by mouth 2 (two) times daily.    Historical Provider, MD  ezetimibe (ZETIA) 10 MG tablet Take 10 mg by mouth daily.      Historical Provider, MD  lanolin ointment Apply 1 application topically at bedtime.    Historical Provider, MD  lisinopril (PRINIVIL,ZESTRIL) 10 MG tablet Take 10 mg by  mouth daily.      Historical Provider, MD  omeprazole (PRILOSEC) 40 MG capsule Take 40 mg by mouth daily.      Historical Provider, MD  phenylephrine (NEO-SYNEPHRINE) 1 % nasal spray Place 1 drop into both nostrils 3 (three) times daily as needed for congestion.    Historical Provider, MD  pioglitazone-glimepiride (DUETACT) 30-4 MG per tablet Take 1 tablet by mouth daily with breakfast.      Historical Provider, MD  rosuvastatin (CRESTOR) 20 MG tablet Take 20 mg by mouth daily.      Historical Provider, MD  sitaGLIPtin (JANUVIA) 100 MG tablet Take 100 mg by mouth daily.      Historical Provider, MD   BP 127/73 mmHg  Pulse 92  Temp(Src) 97.1 F (36.2 C) (Rectal)  Resp 27  SpO2 100% Physical Exam CONSTITUTIONAL: elderly, frail and she appears confused HEAD: Normocephalic/atraumatic EYES: EOMI ENMT: Mucous membranes dry NECK: supple no meningeal signs SPINE/BACK:she has staples in place to lumbar spine.  There is no drainage/erythema noted CV: S1/S2 noted LUNGS: Lungs are clear to auscultation bilaterally, no apparent distress ABDOMEN: soft, nontender, no rebound or guarding, bowel sounds noted throughout abdomen.  Extensive bruising to lower abdomen likely from heparin/lovenix SQ injection NEURO: Pt is awake/alert.  She is confused, reporting she is cold while covered in blankets.  She can move upper extremities without difficulty.  She can move her lower extremities but is limited due to pain EXTREMITIES: pulses normal/equal, full ROM, All extremities/joints palpated/ranged and nontender SKIN: warm, color normal PSYCH: unable to assess due to mental status changes  ED Course  Procedures   5:18 AM Pt from NH for increasing confusion and falls. She had recent lumbar laminectomy (3/23) and has been in NH due to pain/deconditioning Apparently her confusion has worsened over past 24-48HRS She is not on chronic/daily pain meds Last dose of APAP was 1616 on 09/26/14 She appears confused  and NH staff reports pt has been calling out for family that is not present and hallucinating.  She was scheduled to have CT head later today due to confusion to evaluate for stroke.   Also, it is noted that recent labs have shown leukocytosis but negative urinalysis and no signs of postop wound infection.  Due to leukocytosis, pt has been started on levaquin/rocephin at Nursing Home. It is noted that if workup is unremarkable and no acute findings and pt is stable, she can be released back to [facility] for further care. 5:57 AM I spoke to daughter [name] [phone]) and her brother Essie HartRollin is in the room with patient Apparently per her daughter, she has been confused since the surgery and it has not improved  Given history, her AMS could multi-factorial (pain, sundowning, recent surgery) Advised daughter we will screen for causes of AMS with imaging/labs 6:59 AM Pt improved She is more comfortable She is more coherent She is able to give more of a history. She can recall more details about her recent care She has no new weakness - she reports her legs have been weak even before surgery.  She has no focal signs of acute CVA She is dehydrated Will get dedicated left hip films as she does have continued pain in that area If negative will be able to go back to [facility]    Labs Review Labs Reviewed  CBC - Abnormal; Notable for the following:    WBC 15.9 (*)    RBC 3.09 (*)    Hemoglobin 10.1 (*)    HCT 31.0 (*)    MCV 100.3 (*)    Platelets 463 (*)    All other components within normal limits  DIFFERENTIAL - Abnormal; Notable for the following:    Neutrophils Relative % 82 (*)    Neutro Abs 13.0 (*)    Lymphocytes Relative 9 (*)    Monocytes Absolute 1.1 (*)    All other components within normal limits  COMPREHENSIVE METABOLIC PANEL - Abnormal; Notable for the following:    Potassium 3.4 (*)    CO2 17 (*)    Glucose, Bld 111 (*)    BUN 28 (*)    Albumin 2.7 (*)    GFR calc non Af  Amer 48 (*)    GFR calc Af Amer 56 (*)    All other components within normal limits  URINE RAPID DRUG SCREEN (HOSP PERFORMED) - Abnormal; Notable for the following:    Opiates POSITIVE (*)    Benzodiazepines POSITIVE (*)    All other components within normal limits  URINALYSIS, ROUTINE W REFLEX MICROSCOPIC - Abnormal; Notable for the following:    APPearance HAZY (*)    Ketones, ur TRACE (*)    All other components within normal limits  I-STAT TROPOININ, ED    Imaging Review Ct Head Wo Contrast  09/27/2014   CLINICAL DATA:  Acute onset of altered mental status and confusion. Dizziness, weakness and headache. Status post fall. Initial encounter.  EXAM: CT HEAD WITHOUT CONTRAST  CT CERVICAL SPINE WITHOUT CONTRAST  TECHNIQUE: Multidetector CT imaging of the head and cervical spine was performed following the standard protocol without intravenous contrast. Multiplanar CT image reconstructions of the cervical spine were also generated.  COMPARISON:  None.  FINDINGS: CT HEAD FINDINGS  There is no evidence of acute infarction, mass lesion, or intra- or extra-axial hemorrhage on CT.  Prominence of the ventricles and sulci reflects moderate cortical volume loss. Cerebellar atrophy is noted. Scattered periventricular and subcortical white matter change likely reflects small vessel ischemic microangiopathy.  The brainstem and fourth ventricle are within normal limits. The basal ganglia are unremarkable in appearance. The cerebral hemispheres demonstrate grossly normal gray-white differentiation. No mass effect or midline shift is seen.  There is no evidence of fracture; visualized osseous structures are unremarkable in appearance. The orbits are within normal limits. The paranasal sinuses and mastoid air cells are well-aerated. No significant soft tissue abnormalities are seen.  CT CERVICAL SPINE FINDINGS  There is no evidence of fracture or subluxation. Vertebral bodies demonstrate normal height and alignment.  Multilevel disc space narrowing is noted along the cervical spine, with scattered anterior and posterior disc osteophyte complexes. Prevertebral soft tissues are within normal limits.  The thyroid gland is unremarkable in appearance. The visualized lung apices are clear. Dense calcification is noted at the carotid bifurcations bilaterally.  IMPRESSION: 1. No evidence of traumatic intracranial injury or fracture. 2. No evidence of fracture or subluxation along the cervical spine. 3. Moderate cortical volume loss and scattered small vessel ischemic microangiopathy. 4. Mild diffuse degenerative change along the cervical spine. 5. Dense calcification at the carotid bifurcations bilaterally. Carotid ultrasound would be helpful for further evaluation, when and as deemed clinically appropriate.   Electronically Signed   By: Roanna Raider M.D.   On: 09/27/2014 06:32   Ct Cervical Spine Wo Contrast  09/27/2014   CLINICAL DATA:  Acute onset of altered mental status and confusion. Dizziness, weakness and headache. Status post fall. Initial encounter.  EXAM: CT HEAD WITHOUT CONTRAST  CT CERVICAL SPINE WITHOUT CONTRAST  TECHNIQUE: Multidetector CT imaging of the head and cervical spine was performed following the standard protocol without intravenous contrast. Multiplanar CT image reconstructions of the cervical spine were also generated.  COMPARISON:  None.  FINDINGS: CT HEAD FINDINGS  There is no evidence of acute infarction, mass lesion, or intra- or extra-axial hemorrhage on CT.  Prominence of the ventricles and sulci reflects moderate cortical volume loss. Cerebellar atrophy is noted. Scattered periventricular and subcortical white matter change likely reflects small vessel ischemic microangiopathy.  The brainstem and fourth ventricle are within normal limits. The basal ganglia are unremarkable in appearance. The cerebral hemispheres demonstrate grossly normal gray-white differentiation. No mass effect or midline shift  is seen.  There is no evidence of fracture; visualized osseous structures are unremarkable in appearance. The orbits are within normal limits. The paranasal sinuses and mastoid air cells are well-aerated. No significant soft tissue abnormalities are seen.  CT CERVICAL SPINE FINDINGS  There is no evidence of fracture or subluxation. Vertebral bodies demonstrate normal height and alignment. Multilevel disc space narrowing is noted along the cervical spine, with scattered anterior and posterior disc osteophyte complexes. Prevertebral soft tissues are within normal limits.  The thyroid gland is unremarkable in appearance. The visualized lung apices are clear. Dense calcification is noted at the carotid bifurcations bilaterally.  IMPRESSION: 1. No evidence of traumatic intracranial injury or fracture. 2. No evidence of fracture or subluxation along the cervical spine. 3. Moderate cortical volume loss and scattered small vessel ischemic microangiopathy. 4. Mild diffuse degenerative change along the cervical spine. 5. Dense calcification at the carotid bifurcations bilaterally. Carotid ultrasound would be helpful for further evaluation, when and as deemed clinically appropriate.   Electronically Signed   By: Roanna Raider M.D.   On: 09/27/2014 06:32   Dg Pelvis Portable  09/27/2014   CLINICAL DATA:  Status post fall on hard surface floor. Sharp left hip pain. Initial encounter.  EXAM: PORTABLE PELVIS 1-2 VIEWS  COMPARISON:  Lumbar spine CT performed 09/15/2014  FINDINGS: Degenerative change is noted at the left hip joint, with diffuse sclerosis and remodeling of the left femoral head. An underlying femoral head fracture cannot be excluded, but is not definitely seen.  More mild right hip joint sclerosis is seen. Diffuse degenerative change is noted along the lumbar spine. The sacroiliac joints are unremarkable in appearance.  The visualized bowel gas pattern is grossly unremarkable in appearance. Scattered phleboliths  are noted within the pelvis.  IMPRESSION: 1. Degenerative change at the left hip joint, with diffuse sclerosis and bony remodeling of the left femoral head. Underlying femoral head fracture cannot be excluded, but is not definitely characterized  on this single image. This could be better characterized on dedicated left hip radiographs, as deemed clinically appropriate. 2. Minimal right hip degenerative change noted. Diffuse degenerative change along the lumbar spine.   Electronically Signed   By: Roanna Raider M.D.   On: 09/27/2014 06:23   Dg Chest Portable 1 View  09/27/2014   CLINICAL DATA:  Larey Seat onto hard surface floor. Concern for chest injury. Initial encounter.  EXAM: PORTABLE CHEST - 1 VIEW  COMPARISON:  Chest radiograph performed 09/19/2014  FINDINGS: The lungs are well-aerated. Minimal right-sided atelectasis is noted. There is no evidence of pleural effusion or pneumothorax.  The cardiomediastinal silhouette is mildly enlarged. No acute osseous abnormalities are seen. Degenerative change is noted at the acromioclavicular joints bilaterally.  IMPRESSION: Minimal right-sided atelectasis noted. Mild cardiomegaly. No displaced rib fracture seen.   Electronically Signed   By: Roanna Raider M.D.   On: 09/27/2014 06:20     EKG Interpretation   Date/Time:  Saturday September 27 2014 04:52:12 EDT Ventricular Rate:  88 PR Interval:    QRS Duration: 69 QT Interval:  450 QTC Calculation: 544 R Axis:   40 Text Interpretation:  artifact noted suspect sinus rhythm Low voltage,  precordial leads Nonspecific T abnrm, anterolateral leads Prolonged QT  interval Confirmed by Bebe Shaggy  MD, Dorinda Hill ([id number]) on 09/27/2014 5:07:03 AM      MDM   Final diagnoses:  Pain  Left hip pain  Dehydration  Post-operative pain  Leukocytosis  Fall, initial encounter  Altered mental status, unspecified altered mental status type    Nursing notes including past medical history and social history reviewed and  considered in documentation Previous records reviewed and considered Labs/vital reviewed myself and considered during evaluation     Zadie Rhine, MD 09/27/14 [phone]

## 2014-09-28 ENCOUNTER — Inpatient Hospital Stay (HOSPITAL_COMMUNITY): Payer: Medicare Other | Admitting: Anesthesiology

## 2014-09-28 ENCOUNTER — Encounter (HOSPITAL_COMMUNITY)
Admission: EM | Disposition: A | Discharge status: Skilled Nursing Facility | Payer: Self-pay | Source: Home / Self Care | Attending: Internal Medicine

## 2014-09-28 ENCOUNTER — Inpatient Hospital Stay (HOSPITAL_COMMUNITY): Payer: Medicare Other

## 2014-09-28 DIAGNOSIS — R4182 Altered mental status, unspecified: Secondary | ICD-10-CM | POA: Insufficient documentation

## 2014-09-28 DIAGNOSIS — G9340 Encephalopathy, unspecified: Secondary | ICD-10-CM

## 2014-09-28 DIAGNOSIS — I10 Essential (primary) hypertension: Secondary | ICD-10-CM

## 2014-09-28 DIAGNOSIS — S72012G Unspecified intracapsular fracture of left femur, subsequent encounter for closed fracture with delayed healing: Secondary | ICD-10-CM

## 2014-09-28 DIAGNOSIS — S72012D Unspecified intracapsular fracture of left femur, subsequent encounter for closed fracture with routine healing: Secondary | ICD-10-CM

## 2014-09-28 DIAGNOSIS — D539 Nutritional anemia, unspecified: Secondary | ICD-10-CM

## 2014-09-28 HISTORY — PX: HIP ARTHROPLASTY: SHX981

## 2014-09-28 LAB — BASIC METABOLIC PANEL
Anion gap: 9 (ref 5–15)
BUN: 16 mg/dL (ref 6–23)
CHLORIDE: 110 mmol/L (ref 96–112)
CO2: 19 mmol/L (ref 19–32)
Calcium: 8.3 mg/dL — ABNORMAL LOW (ref 8.4–10.5)
Creatinine, Ser: 0.69 mg/dL (ref 0.50–1.10)
GFR calc Af Amer: >90 mL/min (ref >90)
GFR, EST NON AFRICAN AMERICAN: 79 mL/min — AB (ref >90)
Glucose, Bld: 119 mg/dL — ABNORMAL HIGH (ref 70–99)
POTASSIUM: 3.3 mmol/L — AB (ref 3.5–5.1)
Sodium: 138 mmol/L (ref 135–145)

## 2014-09-28 LAB — CBC
HCT: 27.1 % — ABNORMAL LOW (ref 36.0–46.0)
Hemoglobin: 8.6 g/dL — ABNORMAL LOW (ref 12.0–15.0)
MCH: 32.1 pg (ref 26.0–34.0)
MCHC: 31.7 g/dL (ref 30.0–36.0)
MCV: 101.1 fL — AB (ref 78.0–100.0)
PLATELETS: 437 10*3/uL — AB (ref 150–400)
RBC: 2.68 MIL/uL — ABNORMAL LOW (ref 3.87–5.11)
RDW: 15 % (ref 11.5–15.5)
WBC: 11.4 10*3/uL — AB (ref 4.0–10.5)

## 2014-09-28 LAB — GLUCOSE, CAPILLARY
GLUCOSE-CAPILLARY: 128 mg/dL — AB (ref 70–99)
Glucose-Capillary: 187 mg/dL — ABNORMAL HIGH (ref 70–99)
Glucose-Capillary: 127 mg/dL — ABNORMAL HIGH (ref 70–99)

## 2014-09-28 SURGERY — HEMIARTHROPLASTY, HIP, DIRECT ANTERIOR APPROACH, FOR FRACTURE
Anesthesia: General | Laterality: Left

## 2014-09-28 MED ORDER — ONDANSETRON HCL 4 MG PO TABS: 4.0000 mg | ORAL_TABLET | Freq: Three times a day (TID) | ORAL | Status: DC | PRN

## 2014-09-28 MED ORDER — HALOPERIDOL LACTATE 5 MG/ML IJ SOLN
5.0000 mg | Freq: Four times a day (QID) | INTRAMUSCULAR | Status: DC | PRN
  Administered 2014-09-28: 5 mg via INTRAVENOUS
  Filled 2014-09-28: qty 1

## 2014-09-28 MED ORDER — LIDOCAINE HCL (CARDIAC) 20 MG/ML IV SOLN
INTRAVENOUS | Status: DC | PRN
  Administered 2014-09-28: 40 mg via INTRAVENOUS
  Administered 2014-09-28: 60 mg via INTRAVENOUS

## 2014-09-28 MED ORDER — CEFAZOLIN SODIUM-DEXTROSE 2-3 GM-% IV SOLR
INTRAVENOUS | Status: DC | PRN
  Administered 2014-09-28: 2 g via INTRAVENOUS

## 2014-09-28 MED ORDER — METOCLOPRAMIDE HCL 5 MG PO TABS: 5.0000 mg | ORAL_TABLET | Freq: Three times a day (TID) | ORAL | Status: DC | PRN

## 2014-09-28 MED ORDER — PHENYLEPHRINE HCL 10 MG/ML IJ SOLN
10.0000 mg | INTRAVENOUS | Status: DC | PRN
  Administered 2014-09-28: 50 ug/min via INTRAVENOUS

## 2014-09-28 MED ORDER — METOCLOPRAMIDE HCL 5 MG/ML IJ SOLN: 5.0000 mg | Freq: Three times a day (TID) | INTRAMUSCULAR | Status: DC | PRN

## 2014-09-28 MED ORDER — FENTANYL CITRATE 0.05 MG/ML IJ SOLN
25.0000 ug | INTRAMUSCULAR | Status: DC | PRN
  Administered 2014-09-28 (×3): 25 ug via INTRAVENOUS

## 2014-09-28 MED ORDER — LACTATED RINGERS IV SOLN
INTRAVENOUS | Status: DC | PRN
  Administered 2014-09-28 (×2): via INTRAVENOUS

## 2014-09-28 MED ORDER — ROCURONIUM BROMIDE 50 MG/5ML IV SOLN
INTRAVENOUS | Status: AC
  Filled 2014-09-28: qty 1

## 2014-09-28 MED ORDER — ACETAMINOPHEN 325 MG PO TABS
650.0000 mg | ORAL_TABLET | Freq: Four times a day (QID) | ORAL | Status: DC | PRN
  Administered 2014-09-29: 650 mg via ORAL
  Filled 2014-09-28: qty 2

## 2014-09-28 MED ORDER — MENTHOL 3 MG MT LOZG: 1.0000 | LOZENGE | OROMUCOSAL | Status: DC | PRN

## 2014-09-28 MED ORDER — ONDANSETRON HCL 4 MG PO TABS: 4.0000 mg | ORAL_TABLET | Freq: Four times a day (QID) | ORAL | Status: DC | PRN

## 2014-09-28 MED ORDER — CEFAZOLIN SODIUM-DEXTROSE 2-3 GM-% IV SOLR
2.0000 g | Freq: Four times a day (QID) | INTRAVENOUS | Status: AC
  Administered 2014-09-28: 2 g via INTRAVENOUS
  Filled 2014-09-28 (×2): qty 50

## 2014-09-28 MED ORDER — LORAZEPAM 2 MG/ML IJ SOLN
0.5000 mg | Freq: Four times a day (QID) | INTRAMUSCULAR | Status: DC | PRN
  Administered 2014-09-28: 0.5 mg via INTRAVENOUS
  Filled 2014-09-28: qty 1

## 2014-09-28 MED ORDER — ASPIRIN 325 MG PO TABS: 325.0000 mg | ORAL_TABLET | Freq: Every day | ORAL | Status: DC

## 2014-09-28 MED ORDER — ARTIFICIAL TEARS OP OINT
TOPICAL_OINTMENT | OPHTHALMIC | Status: AC
  Filled 2014-09-28: qty 3.5

## 2014-09-28 MED ORDER — MIDAZOLAM HCL 2 MG/2ML IJ SOLN
INTRAMUSCULAR | Status: AC
  Filled 2014-09-28: qty 2

## 2014-09-28 MED ORDER — ARTIFICIAL TEARS OP OINT
TOPICAL_OINTMENT | OPHTHALMIC | Status: DC | PRN
  Administered 2014-09-28: 1 via OPHTHALMIC

## 2014-09-28 MED ORDER — FENTANYL CITRATE 0.05 MG/ML IJ SOLN
INTRAMUSCULAR | Status: AC
  Administered 2014-09-28: 25 ug via INTRAVENOUS
  Filled 2014-09-28: qty 2

## 2014-09-28 MED ORDER — POTASSIUM CHLORIDE CRYS ER 20 MEQ PO TBCR
40.0000 meq | EXTENDED_RELEASE_TABLET | Freq: Once | ORAL | Status: AC
  Administered 2014-09-29: 40 meq via ORAL
  Filled 2014-09-28: qty 2

## 2014-09-28 MED ORDER — PROPOFOL 10 MG/ML IV BOLUS
INTRAVENOUS | Status: DC | PRN
  Administered 2014-09-28 (×2): 50 mg via INTRAVENOUS
  Administered 2014-09-28: 100 mg via INTRAVENOUS

## 2014-09-28 MED ORDER — CEFAZOLIN SODIUM-DEXTROSE 2-3 GM-% IV SOLR
INTRAVENOUS | Status: AC
  Filled 2014-09-28: qty 50

## 2014-09-28 MED ORDER — ONDANSETRON HCL 4 MG/2ML IJ SOLN: 4.0000 mg | Freq: Four times a day (QID) | INTRAMUSCULAR | Status: DC | PRN

## 2014-09-28 MED ORDER — PROMETHAZINE HCL 25 MG/ML IJ SOLN
6.2500 mg | INTRAMUSCULAR | Status: DC | PRN
Start: 2014-09-28 — End: 2014-09-30

## 2014-09-28 MED ORDER — ROCURONIUM BROMIDE 100 MG/10ML IV SOLN
INTRAVENOUS | Status: DC | PRN
  Administered 2014-09-28: 40 mg via INTRAVENOUS

## 2014-09-28 MED ORDER — FENTANYL CITRATE 0.05 MG/ML IJ SOLN
INTRAMUSCULAR | Status: AC
  Filled 2014-09-28: qty 5

## 2014-09-28 MED ORDER — MEPERIDINE HCL 25 MG/ML IJ SOLN: 6.2500 mg | INTRAMUSCULAR | Status: DC | PRN

## 2014-09-28 MED ORDER — PROPOFOL 10 MG/ML IV BOLUS
INTRAVENOUS | Status: AC
  Filled 2014-09-28: qty 20

## 2014-09-28 MED ORDER — 0.9 % SODIUM CHLORIDE (POUR BTL) OPTIME
TOPICAL | Status: DC | PRN
  Administered 2014-09-28: 1000 mL

## 2014-09-28 MED ORDER — SUCCINYLCHOLINE CHLORIDE 20 MG/ML IJ SOLN
INTRAMUSCULAR | Status: AC
  Filled 2014-09-28: qty 1

## 2014-09-28 MED ORDER — LIDOCAINE HCL (CARDIAC) 20 MG/ML IV SOLN
INTRAVENOUS | Status: AC
  Filled 2014-09-28: qty 5

## 2014-09-28 MED ORDER — PHENOL 1.4 % MT LIQD: 1.0000 | OROMUCOSAL | Status: DC | PRN

## 2014-09-28 MED ORDER — FENTANYL CITRATE 0.05 MG/ML IJ SOLN
INTRAMUSCULAR | Status: DC | PRN
  Administered 2014-09-28: 100 ug via INTRAVENOUS
  Administered 2014-09-28 (×2): 50 ug via INTRAVENOUS

## 2014-09-28 MED ORDER — ACETAMINOPHEN 650 MG RE SUPP: 650.0000 mg | Freq: Four times a day (QID) | RECTAL | Status: DC | PRN

## 2014-09-28 MED ORDER — ASPIRIN 325 MG PO TABS
325.0000 mg | ORAL_TABLET | Freq: Every day | ORAL | Status: DC
  Administered 2014-09-29 – 2014-09-30 (×2): 325 mg via ORAL
  Filled 2014-09-28 (×2): qty 1

## 2014-09-28 MED ORDER — HYDROCODONE-ACETAMINOPHEN 5-325 MG PO TABS: 1.0000 | ORAL_TABLET | Freq: Four times a day (QID) | ORAL | Status: DC | PRN

## 2014-09-28 SURGICAL SUPPLY — 49 items
BIT DRILL 7/64X5 DISP (BIT) ×3 IMPLANT
BLADE SAGITTAL 25.0X1.27X90 (BLADE) ×2 IMPLANT
BLADE SAGITTAL 25.0X1.27X90MM (BLADE) ×1
CAPT HIP HEMI 2 ×3 IMPLANT
CLOSURE STERI-STRIP 1/2X4 (GAUZE/BANDAGES/DRESSINGS) ×2
CLSR STERI-STRIP ANTIMIC 1/2X4 (GAUZE/BANDAGES/DRESSINGS) ×4 IMPLANT
COVER SURGICAL LIGHT HANDLE (MISCELLANEOUS) ×3 IMPLANT
DRAPE IMP U-DRAPE 54X76 (DRAPES) ×3 IMPLANT
DRAPE ORTHO SPLIT 77X108 STRL (DRAPES) ×4
DRAPE SURG ORHT 6 SPLT 77X108 (DRAPES) ×2 IMPLANT
DRAPE U-SHAPE 47X51 STRL (DRAPES) ×3 IMPLANT
DRSG MEPILEX BORDER 4X8 (GAUZE/BANDAGES/DRESSINGS) ×3 IMPLANT
DURAPREP 26ML APPLICATOR (WOUND CARE) ×3 IMPLANT
ELECT CAUTERY BLADE 6.4 (BLADE) ×3 IMPLANT
ELECT REM PT RETURN 9FT ADLT (ELECTROSURGICAL) ×3
ELECTRODE REM PT RTRN 9FT ADLT (ELECTROSURGICAL) ×1 IMPLANT
FACESHIELD WRAPAROUND (MASK) ×3 IMPLANT
GLOVE BIO SURGEON STRL SZ7 (GLOVE) ×3 IMPLANT
GLOVE BIO SURGEON STRL SZ7.5 (GLOVE) IMPLANT
GLOVE BIOGEL PI IND STRL 7.0 (GLOVE) ×1 IMPLANT
GLOVE BIOGEL PI IND STRL 8 (GLOVE) ×1 IMPLANT
GLOVE BIOGEL PI INDICATOR 7.0 (GLOVE) ×2
GLOVE BIOGEL PI INDICATOR 8 (GLOVE) ×2
GOWN STRL REUS W/ TWL LRG LVL3 (GOWN DISPOSABLE) ×1 IMPLANT
GOWN STRL REUS W/ TWL XL LVL3 (GOWN DISPOSABLE) ×1 IMPLANT
GOWN STRL REUS W/TWL LRG LVL3 (GOWN DISPOSABLE) ×2
GOWN STRL REUS W/TWL XL LVL3 (GOWN DISPOSABLE) ×2
HIP CAPITATED HEMI 2 ×1 IMPLANT
KIT BASIN OR (CUSTOM PROCEDURE TRAY) ×3 IMPLANT
KIT ROOM TURNOVER OR (KITS) ×3 IMPLANT
MANIFOLD NEPTUNE II (INSTRUMENTS) ×3 IMPLANT
NS IRRIG 1000ML POUR BTL (IV SOLUTION) ×3 IMPLANT
PACK TOTAL JOINT (CUSTOM PROCEDURE TRAY) ×3 IMPLANT
PACK UNIVERSAL I (CUSTOM PROCEDURE TRAY) ×3 IMPLANT
PAD ARMBOARD 7.5X6 YLW CONV (MISCELLANEOUS) ×6 IMPLANT
PILLOW ABDUCTION HIP (SOFTGOODS) ×3 IMPLANT
RETRIEVER SUT HEWSON (MISCELLANEOUS) ×3 IMPLANT
SLEEVE UNITRAX V40 (Orthopedic Implant) ×2 IMPLANT
SLEEVE UNITRAX V40 +4 (Orthopedic Implant) ×1 IMPLANT
SUT FIBERWIRE #2 38 REV NDL BL (SUTURE) ×6
SUT MNCRL AB 4-0 PS2 18 (SUTURE) ×3 IMPLANT
SUT MON AB 2-0 CT1 36 (SUTURE) ×3 IMPLANT
SUT VIC AB 1 CT1 27 (SUTURE) ×2
SUT VIC AB 1 CT1 27XBRD ANBCTR (SUTURE) ×1 IMPLANT
SUTURE FIBERWR#2 38 REV NDL BL (SUTURE) ×2 IMPLANT
TOWEL OR 17X24 6PK STRL BLUE (TOWEL DISPOSABLE) ×3 IMPLANT
TOWEL OR 17X26 10 PK STRL BLUE (TOWEL DISPOSABLE) ×3 IMPLANT
TRAY FOLEY CATH 14FR (SET/KITS/TRAYS/PACK) IMPLANT
WATER STERILE IRR 1000ML POUR (IV SOLUTION) ×6 IMPLANT

## 2014-09-28 NOTE — Op Note (Signed)
09/27/2014 - 09/28/2014  10:54 AM  PATIENT:  Tina [name]   MRN: [id number]  PRE-OPERATIVE DIAGNOSIS:  left hip pain  POST-OPERATIVE DIAGNOSIS:  left hip pain  PROCEDURE:  Procedure(s): HEMI ARTHROPLASTY  PREOPERATIVE INDICATIONS:  Tina [name] is an 81 y.o. female who was admitted 09/27/2014 with a diagnosis of Fracture of femoral neck, left and elected for surgical management.  The risks benefits and alternatives were discussed with the patient including but not limited to the risks of nonoperative treatment, versus surgical intervention including infection, bleeding, nerve injury, periprosthetic fracture, the need for revision surgery, dislocation, leg length discrepancy, blood clots, cardiopulmonary complications, morbidity, mortality, among others, and they were willing to proceed.  Predicted outcome is good, although there will be at least a six to nine month expected recovery.   OPERATIVE REPORT     SURGEON:  Edmonia Lynch, MD    ASSISTANT:  Lovett Calender, PA-C, She was present and scrubbed throughout the case, critical for completion in a timely fashion, and for retraction, instrumentation, and closure.     ANESTHESIA:  General    COMPLICATIONS:  None.      COMPONENTS:  [employer]: Femoral stem: 2.5, Femoral Head:45, Neck:+8   PROCEDURE IN DETAIL: The patient was met in the holding area and identified.  The appropriate hip  was marked at the operative site. The patient was then transported to the OR and  placed under general anesthesia.  At that point, the patient was  placed in the lateral decubitus position with the operative side up and  secured to the operating room table and all bony prominences padded.     The operative lower extremity was prepped from the iliac crest to the toes.  Sterile draping was performed.  Time out was performed prior to incision.      A routine posterolateral approach was utilized via sharp dissection  carried down to the subcutaneous  tissue.  Gross bleeders were Bovie  coagulated.  The iliotibial band was identified and incised  along the length of the skin incision.  Self-retaining retractors were  inserted.  With the hip internally rotated, the short external rotators  were identified. The piriformis was tagged with FiberWire, and the hip capsule released in a T-type fashion.  The femoral neck was exposed, and I resected the femoral neck using the appropriate jig. This was performed at approximately a thumb's breadth above the lesser trochanter.    I then exposed the deep acetabulum, cleared out any tissue including the ligamentum teres, and included the hip capsule in the FiberWire used above and below the T.    I then prepared the proximal femur using the cookie-cutter, the lateralizing reamer, and then sequentially broached.  A trial utilized, and I reduced the hip and it was found to have excellent stability with functional range of motion. The trial components were then removed.   The canal and acetabulum were thoroughly irrigated  I inserted the pressfit stem and placed the head and neck collar. The hip was reduced with appropriate force and was stable through a range of motion.   I then used a 2 mm drill bits to pass the FiberWire suture from the capsule and puriform is through the greater trochanter, and secured this. Excellent posterior capsular repair was achieved. I also closed the T in the capsule.  I then irrigated the hip copiously again with pulse lavage, and repaired the fascia with Vicryl, followed by Vicryl for the subcutaneous tissue, Monocryl for  the skin, Steri-Strips and sterile gauze. The wounds were injected. The patient was then awakened and returned to PACU in stable and satisfactory condition. There were no complications.  POST-OP PLAN: Weight bearing as tolerated. DVT px will consist of SCD's and chemical px post op  Edmonia Lynch, MD Orthopedic Surgeon [phone]   09/28/2014 10:54  AM   This note was generated using a template and dragon dictation system. In light of that, I have reviewed the note and all aspects of it are applicable to this case. Any dictation errors are due to the computerized dictation system.

## 2014-09-28 NOTE — Progress Notes (Signed)
Orthopedic Tech Progress Note Patient Details:  [name] Grim 04/18/1931 [id number]  Patient ID: [name] Bacigalupi, female   DOB: 03/09/1931, 81 y.o.   MRN: [id number] Pt unable to use trapeze bar patient helper  Nikki DomCrawford, Ermal Brzozowski 09/28/2014, 1:54 PM

## 2014-09-28 NOTE — Discharge Instructions (Signed)
INSTRUCTIONS o Remove items at home which could result in a fall. This includes throw rugs or furniture in walking pathways o ICE to the affected joint every three hours while awake for 30 minutes at a time, for at least the first 3-5 days, and then as needed for pain and swelling.  Continue to use ice for pain and swelling. You may notice swelling that will progress down to the foot and ankle.  This is normal after surgery.  Elevate your leg when you are not up walking on it.   o Continue to use the breathing machine you got in the hospital (incentive spirometer) which will help keep your temperature down.  It is common for your temperature to cycle up and down following surgery, especially at night when you are not up moving around and exerting yourself.  The breathing machine keeps your lungs expanded and your temperature down.   DIET:  As you were doing prior to hospitalization, we recommend a well-balanced diet.  DRESSING / WOUND CARE / SHOWERING  Keep the surgical dressing until follow up.  IF THE DRESSING FALLS OFF or the wound gets wet inside, change the dressing with sterile gauze.  Please use good hand washing techniques before changing the dressing.  Do not use any lotions or creams on the incision until instructed by your surgeon.    ACTIVITY  o Increase activity slowly as tolerated, but follow the weight bearing instructions below.   o No driving for 6 weeks or until further direction given by your physician.  You cannot drive while taking narcotics.  o No lifting or carrying greater than 10 lbs. until further directed by your surgeon. o Avoid periods of inactivity such as sitting longer than an hour when not asleep. This helps prevent blood clots.  o You may return to work once you are authorized by your doctor.     WEIGHT BEARING   Weight bearing as tolerated with assist device (walker, cane, etc) as directed, use it as long as suggested by your surgeon or therapist, typically  at least 4-6 weeks. POSTERIOR HIP PRECAUTIONS  CONSTIPATION  Constipation is defined medically as fewer than three stools per week and severe constipation as less than one stool per week.  Even if you have a regular bowel pattern at home, your normal regimen is likely to be disrupted due to multiple reasons following surgery.  Combination of anesthesia, postoperative narcotics, change in appetite and fluid intake all can affect your bowels.   YOU MUST use at least one of the following options; they are listed in order of increasing strength to get the job done.  They are all available over the counter, and you may need to use some, POSSIBLY even all of these options:    Drink plenty of fluids (prune juice may be helpful) and high fiber foods Colace 100 mg by mouth twice a day  Senokot for constipation as directed and as needed Dulcolax (bisacodyl), take with full glass of water  Miralax (polyethylene glycol) once or twice a day as needed.  If you have tried all these things and are unable to have a bowel movement in the first 3-4 days after surgery call either your surgeon or your primary doctor.    If you experience loose stools or diarrhea, hold the medications until you stool forms back up.  If your symptoms do not get better within 1 week or if they get worse, check with your doctor.  If you experience "  the worst abdominal pain ever" or develop nausea or vomiting, please contact the office immediately for further recommendations for treatment.   ITCHING:  If you experience itching with your medications, try taking only a single pain pill, or even half a pain pill at a time.  You can also use Benadryl over the counter for itching or also to help with sleep.   TED HOSE STOCKINGS:  Use stockings on both legs until for at least 2 weeks or as directed by physician office. They may be removed at night for sleeping.  MEDICATIONS:  See your medication summary on the After Visit Summary that  nursing will review with you.  You may have some home medications which will be placed on hold until you complete the course of blood thinner medication.  It is important for you to complete the blood thinner medication as prescribed.  PRECAUTIONS:  If you experience chest pain or shortness of breath - call 911 immediately for transfer to the hospital emergency department.   If you develop a fever greater that 101 F, purulent drainage from wound, increased redness or drainage from wound, foul odor from the wound/dressing, or calf pain - CONTACT YOUR SURGEON.                                                   FOLLOW-UP APPOINTMENTS:  If you do not already have a post-op appointment, please call the office for an appointment to be seen by your surgeon.  Guidelines for how soon to be seen are listed in your After Visit Summary, but are typically between 1-4 weeks after surgery.  OTHER INSTRUCTIONS:   Knee Replacement:  Do not place pillow under knee, focus on keeping the knee straight while resting. CPM instructions: 0-90 degrees, 2 hours in the morning, 2 hours in the afternoon, and 2 hours in the evening. Place foam block, curve side up under heel at all times except when in CPM or when walking.  DO NOT modify, tear, cut, or change the foam block in any way.  MAKE SURE YOU:   Understand these instructions.   Get help right away if you are not doing well or get worse.    Thank you for letting us be a part of your medical care team.  It is a privilege we respect greatly.  We hope these instructions will help you stay on track for a fast and full recovery!     RETURN IMMEDIATELY IF  you develop new shortness of breath, chest pain, fever, have difficulty moving parts of your body (new weakness, numbness, or incoordination), sudden change in speech, vision, swallowing, or understanding, faint or develop new dizziness, severe headache, become poorly responsive or have an altered mental status  compared to baseline for you, new rash, abdominal pain, or bloody stools,  Return sooner also if you develop new problems for which you have not talked to your caregiver but you feel may be emergency medical conditions.

## 2014-09-28 NOTE — Progress Notes (Signed)
[facility] Trina Aoarla Calyse Murcia, RN

## 2014-09-28 NOTE — Anesthesia Postprocedure Evaluation (Signed)
Anesthesia Post Note  Patient: Tina [name]  Procedure(s) Performed: Procedure(s) (LRB): HEMI ARTHROPLASTY (Left)  Anesthesia type: General  Patient location: PACU  Post pain: Pain level controlled  Post assessment: Post-op Vital signs reviewed  Last Vitals: BP 128/48 mmHg  Pulse 83  Temp(Src) 36.4 C (Oral)  Resp 16  SpO2 100%  Post vital signs: Reviewed  Level of consciousness: sedated  Complications: No apparent anesthesia complications

## 2014-09-28 NOTE — Transfer of Care (Signed)
Immediate Anesthesia Transfer of Care Note  Patient: Tina [name]  Procedure(s) Performed: Procedure(s): HEMI ARTHROPLASTY (Left)  Patient Location: PACU  Anesthesia Type:General  Level of Consciousness: awake and alert   Airway & Oxygen Therapy: Patient connected to face mask oxygen  Post-op Assessment: Report given to RN  Post vital signs: Reviewed and stable  Last Vitals:  Filed Vitals:   09/28/14 0608  BP: 149/38  Pulse: 79  Temp: 36.6 C  Resp: 17    Complications: No apparent anesthesia complications

## 2014-09-28 NOTE — Anesthesia Procedure Notes (Addendum)
Spinal Patient location during procedure: OR Staffing Anesthesiologist: Nolon Nations Performed by: anesthesiologist  Preanesthetic Checklist Completed: patient identified, site marked, surgical consent, pre-op evaluation, timeout performed, IV checked, risks and benefits discussed and monitors and equipment checked Spinal Block Patient position: sitting Prep: ChloraPrep Patient monitoring: heart rate, continuous pulse ox and blood pressure Approach: right paramedian Location: L2-3 Injection technique: single-shot Needle Needle type: Spinocan  Needle gauge: 22 G Needle length: 9 cm Additional Notes Expiration date of kit checked and confirmed. Patient tolerated procedure well, without complications.    Procedure Name: Intubation Performed by: Lelon Perla A Oxygen Delivery Method: Circle system utilized Preoxygenation: Pre-oxygenation with 100% oxygen Intubation Type: IV induction Ventilation: Mask ventilation without difficulty Grade View: Grade I Tube type: Oral Tube size: 7.5 mm Number of attempts: 1 Airway Equipment and Method: Stylet Placement Confirmation: ETT inserted through vocal cords under direct vision and positive ETCO2 Secured at: 23 cm Tube secured with: Tape Dental Injury: Teeth and Oropharynx as per pre-operative assessment

## 2014-09-28 NOTE — Progress Notes (Signed)
PROGRESS NOTE  Tina [name] ZOX:[id number] DOB: 10/25/1930 DOA: 09/27/2014 PCP: Fredirick Maudlin, MD  Brief history 81 year old woman just discharged 3/28 having undergone L4-5 laminotomies at [facility] for lumbar stenosis with neurogenic claudication who presented to the emergency department after a fall at [facility] nursing home when she was found on the floor. Noted to be confused. Imaging revealed a fracture. The patient was transferred to [facility] for operative intervention.   She has had mild memory loss or several months. This became much worse after fall in March and persisted postoperatively suggesting delirium superimposed on dementia. According to chart, she was found on the floor at the nursing home.  Review of the patient's last hospitalization was notable for periods of agitation and anxiety, refusal to work with therapy and some outbursts Assessment/Plan: Left subcapital femoral neck fracture -09/28/14--L- hip hemiarthropasty -ASA for DVT prophylaxis per ortho -PT eval -pain control Leukocytosis -Likely reactive due to stress demargination -Afebrile and hemodynamically stable -We will not start antibiotics at this time Diabetes mellitus type 2 -NovoLog sliding scale -09/12/2014 hemoglobin A1c 5.8 -Hold outpatient pioglitazone/glimiperide -hold januvia Anxiety -Ativan 0.5mg  prn Dementia -haldol 5mg  q6 hrs prn aggitation HTN -Continue lisinopril -Hold amlodipine today and monitor blood pressure lumbar stenosis with left leg radiculopathy/neurogenic claudication Status post left L4-5 foraminotomy per Dr. Jeral Fruit 09/17/2014. Hypokalemia -replete -check mag Macrocytic anemia -B12  -RBC folate   Family Communication:   No family at beside Disposition Plan:   SNF when medically stable       Procedures/Studies: Ct Head Wo Contrast  09/27/2014   CLINICAL DATA:  Acute onset of altered mental status and confusion. Dizziness, weakness and headache. Status  post fall. Initial encounter.  EXAM: CT HEAD WITHOUT CONTRAST  CT CERVICAL SPINE WITHOUT CONTRAST  TECHNIQUE: Multidetector CT imaging of the head and cervical spine was performed following the standard protocol without intravenous contrast. Multiplanar CT image reconstructions of the cervical spine were also generated.  COMPARISON:  None.  FINDINGS: CT HEAD FINDINGS  There is no evidence of acute infarction, mass lesion, or intra- or extra-axial hemorrhage on CT.  Prominence of the ventricles and sulci reflects moderate cortical volume loss. Cerebellar atrophy is noted. Scattered periventricular and subcortical white matter change likely reflects small vessel ischemic microangiopathy.  The brainstem and fourth ventricle are within normal limits. The basal ganglia are unremarkable in appearance. The cerebral hemispheres demonstrate grossly normal gray-white differentiation. No mass effect or midline shift is seen.  There is no evidence of fracture; visualized osseous structures are unremarkable in appearance. The orbits are within normal limits. The paranasal sinuses and mastoid air cells are well-aerated. No significant soft tissue abnormalities are seen.  CT CERVICAL SPINE FINDINGS  There is no evidence of fracture or subluxation. Vertebral bodies demonstrate normal height and alignment. Multilevel disc space narrowing is noted along the cervical spine, with scattered anterior and posterior disc osteophyte complexes. Prevertebral soft tissues are within normal limits.  The thyroid gland is unremarkable in appearance. The visualized lung apices are clear. Dense calcification is noted at the carotid bifurcations bilaterally.  IMPRESSION: 1. No evidence of traumatic intracranial injury or fracture. 2. No evidence of fracture or subluxation along the cervical spine. 3. Moderate cortical volume loss and scattered small vessel ischemic microangiopathy. 4. Mild diffuse degenerative change along the cervical spine. 5.  Dense calcification at the carotid bifurcations bilaterally. Carotid ultrasound would be helpful for further evaluation, when and as deemed  clinically appropriate.   Electronically Signed   By: Roanna Raider M.D.   On: 09/27/2014 06:32   Ct Cervical Spine Wo Contrast  09/27/2014   CLINICAL DATA:  Acute onset of altered mental status and confusion. Dizziness, weakness and headache. Status post fall. Initial encounter.  EXAM: CT HEAD WITHOUT CONTRAST  CT CERVICAL SPINE WITHOUT CONTRAST  TECHNIQUE: Multidetector CT imaging of the head and cervical spine was performed following the standard protocol without intravenous contrast. Multiplanar CT image reconstructions of the cervical spine were also generated.  COMPARISON:  None.  FINDINGS: CT HEAD FINDINGS  There is no evidence of acute infarction, mass lesion, or intra- or extra-axial hemorrhage on CT.  Prominence of the ventricles and sulci reflects moderate cortical volume loss. Cerebellar atrophy is noted. Scattered periventricular and subcortical white matter change likely reflects small vessel ischemic microangiopathy.  The brainstem and fourth ventricle are within normal limits. The basal ganglia are unremarkable in appearance. The cerebral hemispheres demonstrate grossly normal gray-white differentiation. No mass effect or midline shift is seen.  There is no evidence of fracture; visualized osseous structures are unremarkable in appearance. The orbits are within normal limits. The paranasal sinuses and mastoid air cells are well-aerated. No significant soft tissue abnormalities are seen.  CT CERVICAL SPINE FINDINGS  There is no evidence of fracture or subluxation. Vertebral bodies demonstrate normal height and alignment. Multilevel disc space narrowing is noted along the cervical spine, with scattered anterior and posterior disc osteophyte complexes. Prevertebral soft tissues are within normal limits.  The thyroid gland is unremarkable in appearance. The  visualized lung apices are clear. Dense calcification is noted at the carotid bifurcations bilaterally.  IMPRESSION: 1. No evidence of traumatic intracranial injury or fracture. 2. No evidence of fracture or subluxation along the cervical spine. 3. Moderate cortical volume loss and scattered small vessel ischemic microangiopathy. 4. Mild diffuse degenerative change along the cervical spine. 5. Dense calcification at the carotid bifurcations bilaterally. Carotid ultrasound would be helpful for further evaluation, when and as deemed clinically appropriate.   Electronically Signed   By: Roanna Raider M.D.   On: 09/27/2014 06:32   Ct Lumbar Spine W Contrast  09/15/2014   CLINICAL DATA:  Left leg pain and weakness. Spondylosis without myelopathy.  EXAM: LUMBAR MYELOGRAM  FLUOROSCOPY TIME:  2 minutes 0 seconds. Two thousand four hundred twenty-seven micro gray meter squared  PROCEDURE: After thorough discussion of risks and benefits of the procedure including bleeding, infection, injury to nerves, blood vessels, adjacent structures as well as headache and CSF leak, written and oral informed consent was obtained. Consent was obtained by Dr. Paulina Fusi. Time out form was completed.  Patient was positioned prone on the fluoroscopy table. Local anesthesia was provided with 1% lidocaine without epinephrine after prepped and draped in the usual sterile fashion. Puncture was performed at L5-S1 using a 5 inch 22-gauge spinal needle via a midline approach. Using a single pass through the dura, the needle was placed within the thecal sac, with return of clear CSF. Eighteen of all was injected into the thecal sac, with normal opacification of the nerve roots and cauda equina consistent with free flow within the subarachnoid space.  I personally performed the lumbar puncture and administered the intrathecal contrast. I also personally performed acquisition of the myelogram images.  TECHNIQUE: Contiguous axial images were  obtained through the Lumbar spine after the intrathecal infusion of infusion. Coronal and sagittal reconstructions were obtained of the axial image sets.  COMPARISON:  MRI 09/08/2014  FINDINGS: LUMBAR MYELOGRAM FINDINGS:  There are anterior extradural defects throughout the lumbar region, most pronounced at L4-5. There is stenosis of the left lateral recess at L4-5 and diminished filling of the left L4 root sleeve. No other focal nerve root compression is identified.  CT LUMBAR MYELOGRAM FINDINGS:  T12-L1: Disc degeneration. Mild bulging. No stenosis. Conus tip at mid L1.  L1-2:  Minimal bulging of the disc.  No stenosis.  L2-3: Previous posterior decompression. Disc degeneration. Endplate osteophytes. No central canal stenosis. Foraminal stenosis bilaterally, right worse than left.  L3-4: Previous posterior decompression. Chronic disc degeneration. Endplate osteophytes. No central canal stenosis. Foraminal narrowing bilaterally, right more than left.  L4-5: Previous posterior decompression. Disc degeneration with endplate osteophytes more prominent towards the left. No central canal stenosis. No foraminal stenosis on the right. Severe foraminal stenosis on the left because of bony encroachment. Left L4 nerve root compression seems likely.  L5-S1: Mild bulging of the disc. Bilateral facet degeneration. No central canal stenosis. No S1 nerve compression. Mild foraminal narrowing bilaterally without compression of the exiting L5 nerve roots.  IMPRESSION: Previous posterior decompression at L2-3, L3-4 and L4-5. No central canal stenosis.  Severe left foraminal stenosis at L4-5 because of encroachment by bony material. Left L4 nerve root would likely be compressed.  Less pronounced foraminal narrowing on the left at L2-3 and L3-4 and on the right at L2-3, L3-4 and L4-5.   Electronically Signed   By: Paulina Fusi M.D.   On: 09/15/2014 15:17   Mr Lumbar Spine Wo Contrast  09/08/2014   CLINICAL DATA:  Left leg pain.  Lower extremity weakness. Previous lumbar decompression. Multiple falls at home in the last week.  EXAM: MRI LUMBAR SPINE WITHOUT CONTRAST  TECHNIQUE: Multiplanar, multisequence MR imaging of the lumbar spine was performed. No intravenous contrast was administered.  COMPARISON:  Radiography 08/19/2014.  MRI 03/04/2014.  FINDINGS: There is mild curvature convex to the left with the apex at L3.  T11-12:  Small disc bulge without neural compression.  T12-L1: Small disc bulge without neural compression.  L1-2:  Normal interspace.  L2-3: Previous posterior decompression. Endplate osteophytes and bulging of the disc. Mild narrowing of the lateral recesses without gross neural compression. Mild foraminal stenosis bilaterally.  L3-4: Previous posterior decompression. Endplate osteophytes and mild bulging of the disc. Mild stenosis of both neural foramina without gross neural compression.  L4-5: Previous posterior decompression. Endplate osteophytes and shallow protrusion of disc material. Narrowing of the lateral recesses. Foraminal narrowing left more than right.  L5-S1: Previous posterior decompression. Minimal bulging of the disc. Mild facet degeneration. No significant stenosis.  Compared to the previous study, no change is seen since September 2015.  IMPRESSION: No change since September 2015. Posterior decompression at L2-3, L3-4, L4-5 and L5-S1. No compressive central canal stenosis. Narrowing of the lateral recesses and foramina at L4-5 could be symptomatic, but is unchanged since 2013/11/18. Other less pronounced degenerative changes as outlined above.   Electronically Signed   By: Paulina Fusi M.D.   On: 09/08/2014 09:35   Dg Lumbar Spine 1 View  09/17/2014   CLINICAL DATA:  L4-5 foraminotomy  EXAM: DG C-ARM 61-120 MIN; LUMBAR SPINE - 1 VIEW  COMPARISON:  None.  FLUOROSCOPY TIME:  Radiation Exposure Index (as provided by the fluoroscopic device):  If the device does not provide the exposure index:  Fluoroscopy Time:   5 seconds  Number of Acquired Images:  0  FINDINGS: Single cross-table lateral view  of the lower lumbar spine demonstrates posterior surgical instruments directed at the L4-5 and L5-S1 levels.  IMPRESSION: Intraoperative localization as above.   Electronically Signed   By: Charlett Nose M.D.   On: 09/17/2014 11:19   Dg Lumbar Spine 1 View  09/17/2014   CLINICAL DATA:  Left L4-5 foraminotomy  EXAM: LUMBAR SPINE - 1 VIEW  COMPARISON:  Myelogram of September 15, 2014 and lumbar spine series of August 19, 2014  FINDINGS: The images are suboptimal and it is not possible to accurately localize the lumbosacral junction. Numbering was performed using the lower most rib-bearing vertebral body as T12. The metallic trocar appears to lie approximately 1.5 cm posterior to the L3-L4 disc level.  IMPRESSION: Surgical metallic localization device lying approximately 1.5 cm posterior to the L3-L4 disc space.   Electronically Signed   By: Floretta Petro  [place]   On: 09/17/2014 10:04   Pelvis Portable  09/28/2014   CLINICAL DATA:  Right hip fracture. Postop from right hip arthroplasty.  EXAM: PORTABLE PELVIS 1-2 VIEWS  COMPARISON:  09/27/2014  FINDINGS: Left hip prosthesis seen in expected position. No evidence of fracture or dislocation.  IMPRESSION: Expected postoperative appearance of left hip prosthesis. No evidence of fracture or dislocation.   Electronically Signed   By: Myles Rosenthal M.D.   On: 09/28/2014 13:59   Dg Pelvis Portable  09/27/2014   CLINICAL DATA:  Status post fall on hard surface floor. Sharp left hip pain. Initial encounter.  EXAM: PORTABLE PELVIS 1-2 VIEWS  COMPARISON:  Lumbar spine CT performed 09/15/2014  FINDINGS: Degenerative change is noted at the left hip joint, with diffuse sclerosis and remodeling of the left femoral head. An underlying femoral head fracture cannot be excluded, but is not definitely seen.  More mild right hip joint sclerosis is seen. Diffuse degenerative change is noted along the lumbar spine.  The sacroiliac joints are unremarkable in appearance.  The visualized bowel gas pattern is grossly unremarkable in appearance. Scattered phleboliths are noted within the pelvis.  IMPRESSION: 1. Degenerative change at the left hip joint, with diffuse sclerosis and bony remodeling of the left femoral head. Underlying femoral head fracture cannot be excluded, but is not definitely characterized on this single image. This could be better characterized on dedicated left hip radiographs, as deemed clinically appropriate. 2. Minimal right hip degenerative change noted. Diffuse degenerative change along the lumbar spine.   Electronically Signed   By: Roanna Raider M.D.   On: 09/27/2014 06:23   Ir Fluoro Guide Ndl Plmt / Bx  09/10/2014   CLINICAL DATA:  Lumbosacral spondylosis without myelopathy  FLUOROSCOPY TIME:  36 seconds.  PROCEDURE: CAUDAL EPIDURAL INJECTION  Utilizing a caudal approach, the skin overlying the sacral hiatus was cleansed and anesthetized. A 20 gauge Crawford epidural needle was advanced into the sacral epidural space. Injection of Omnipaque 180 shows a good epidural pattern with spread up to L5-S1. No vascular opacification is seen.  120 mg of Depo-Medrol mixed with 5 cc of normal saline and 3 cc of 1% Lidocaine were instilled. The procedure was well-tolerated, and the patient was discharged thirty minutes following the injection in good condition.  IMPRESSION: Technically successful caudal epidural injection.   Electronically Signed   By: Jolaine Click M.D.   On: 09/10/2014 15:37   Dg Chest Portable 1 View  09/27/2014   CLINICAL DATA:  Larey Seat onto hard surface floor. Concern for chest injury. Initial encounter.  EXAM: PORTABLE CHEST - 1 VIEW  COMPARISON:  Chest  radiograph performed 09/19/2014  FINDINGS: The lungs are well-aerated. Minimal right-sided atelectasis is noted. There is no evidence of pleural effusion or pneumothorax.  The cardiomediastinal silhouette is mildly enlarged. No acute osseous  abnormalities are seen. Degenerative change is noted at the acromioclavicular joints bilaterally.  IMPRESSION: Minimal right-sided atelectasis noted. Mild cardiomegaly. No displaced rib fracture seen.   Electronically Signed   By: Roanna Raider M.D.   On: 09/27/2014 06:20   Dg Chest Port 1 View  09/19/2014   CLINICAL DATA:  Leukocytosis.  EXAM: PORTABLE CHEST - 1 VIEW  COMPARISON:  07/05/2013.  FINDINGS: Mediastinum and hilar structures normal. Mild bibasilar subsegmental atelectasis and/or scarring again noted. Minimal infiltrate in the left base cannot be excluded. No pleural effusion or pneumothorax. Heart size normal. No acute bony abnormality .  IMPRESSION: Mild bibasilar subsegmental atelectasis and/or scarring again noted. Minimal infiltrate in the left lung base cannot be excluded.   Electronically Signed   By: Maisie Fus  Register   On: 09/19/2014 09:34   Dg C-arm 1-60 Min  09/17/2014   CLINICAL DATA:  L4-5 foraminotomy  EXAM: DG C-ARM 61-120 MIN; LUMBAR SPINE - 1 VIEW  COMPARISON:  None.  FLUOROSCOPY TIME:  Radiation Exposure Index (as provided by the fluoroscopic device):  If the device does not provide the exposure index:  Fluoroscopy Time:  5 seconds  Number of Acquired Images:  0  FINDINGS: Single cross-table lateral view of the lower lumbar spine demonstrates posterior surgical instruments directed at the L4-5 and L5-S1 levels.  IMPRESSION: Intraoperative localization as above.   Electronically Signed   By: Charlett Nose M.D.   On: 09/17/2014 11:19   Dg Myelography Lumbar Inj Lumbosacral  09/15/2014   CLINICAL DATA:  Left leg pain and weakness. Spondylosis without myelopathy.  EXAM: LUMBAR MYELOGRAM  FLUOROSCOPY TIME:  2 minutes 0 seconds. Two thousand four hundred twenty-seven micro gray meter squared  PROCEDURE: After thorough discussion of risks and benefits of the procedure including bleeding, infection, injury to nerves, blood vessels, adjacent structures as well as headache and CSF leak,  written and oral informed consent was obtained. Consent was obtained by Dr. Paulina Fusi. Time out form was completed.  Patient was positioned prone on the fluoroscopy table. Local anesthesia was provided with 1% lidocaine without epinephrine after prepped and draped in the usual sterile fashion. Puncture was performed at L5-S1 using a 5 inch 22-gauge spinal needle via a midline approach. Using a single pass through the dura, the needle was placed within the thecal sac, with return of clear CSF. Eighteen of all was injected into the thecal sac, with normal opacification of the nerve roots and cauda equina consistent with free flow within the subarachnoid space.  I personally performed the lumbar puncture and administered the intrathecal contrast. I also personally performed acquisition of the myelogram images.  TECHNIQUE: Contiguous axial images were obtained through the Lumbar spine after the intrathecal infusion of infusion. Coronal and sagittal reconstructions were obtained of the axial image sets.  COMPARISON:  MRI 09/08/2014  FINDINGS: LUMBAR MYELOGRAM FINDINGS:  There are anterior extradural defects throughout the lumbar region, most pronounced at L4-5. There is stenosis of the left lateral recess at L4-5 and diminished filling of the left L4 root sleeve. No other focal nerve root compression is identified.  CT LUMBAR MYELOGRAM FINDINGS:  T12-L1: Disc degeneration. Mild bulging. No stenosis. Conus tip at mid L1.  L1-2:  Minimal bulging of the disc.  No stenosis.  L2-3: Previous posterior decompression. Disc  degeneration. Endplate osteophytes. No central canal stenosis. Foraminal stenosis bilaterally, right worse than left.  L3-4: Previous posterior decompression. Chronic disc degeneration. Endplate osteophytes. No central canal stenosis. Foraminal narrowing bilaterally, right more than left.  L4-5: Previous posterior decompression. Disc degeneration with endplate osteophytes more prominent towards the left. No  central canal stenosis. No foraminal stenosis on the right. Severe foraminal stenosis on the left because of bony encroachment. Left L4 nerve root compression seems likely.  L5-S1: Mild bulging of the disc. Bilateral facet degeneration. No central canal stenosis. No S1 nerve compression. Mild foraminal narrowing bilaterally without compression of the exiting L5 nerve roots.  IMPRESSION: Previous posterior decompression at L2-3, L3-4 and L4-5. No central canal stenosis.  Severe left foraminal stenosis at L4-5 because of encroachment by bony material. Left L4 nerve root would likely be compressed.  Less pronounced foraminal narrowing on the left at L2-3 and L3-4 and on the right at L2-3, L3-4 and L4-5.   Electronically Signed   By: Paulina Fusi M.D.   On: 09/15/2014 15:17   Dg Hip Unilat With Pelvis 2-3 Views Left  09/27/2014   CLINICAL DATA:  Left hip pain following fall, initial encounter  EXAM: LEFT HIP (WITH PELVIS) 2-3 VIEWS  COMPARISON:  08/19/2014, 09/27/2014  FINDINGS: There are changes consistent with a subcapital femoral neck fracture with impaction and angulation at the fracture site. The pelvic ring is otherwise intact. Degenerative changes of left hip joint are noted. Degenerative changes of lumbar spine are seen as well.  IMPRESSION: Subcapital femoral neck fracture on the left.  Complete   Electronically Signed   By: Alcide Clever M.D.   On: 09/27/2014 07:28   Dg Hip Unilat With Pelvis 2-3 Views Right  09/28/2014   CLINICAL DATA:  Larey Seat with new right hip pain  EXAM: RIGHT HIP (WITH PELVIS) 2-3 VIEWS  COMPARISON:  09/27/2014  FINDINGS: The left subcapital hip fracture is again evident. The right hip is intact. The pelvis is intact.  IMPRESSION: Left subcapital hip fracture again evident. No evidence of acute fracture involving the right hip.   Electronically Signed   By: Ellery Plunk M.D.   On: 09/28/2014 01:40         Subjective: Patient is pleasantly confused. She is complaining of low  back pain and left hip pain. Denies any chest pain or shortness of breath, nausea, vomiting, diarrhea, abdominal pain.  Objective: Filed Vitals:   09/28/14 1215 09/28/14 1230 09/28/14 1245 09/28/14 1308  BP: 127/48 140/48 136/46 128/48  Pulse: 87 88 85 83  Temp:    97.5 F (36.4 C)  TempSrc:    Oral  Resp: 17 16 14 16   SpO2: 100% 100% 100% 100%    Intake/Output Summary (Last 24 hours) at 09/28/14 1508 Last data filed at 09/28/14 1130  Gross per 24 hour  Intake    900 ml  Output      0 ml  Net    900 ml   Weight change:  Exam:   General:  Pt is alert, follows commands appropriately, not in acute distress  HEENT: No icterus, No thrush, [place]/AT  Cardiovascular: RRR, S1/S2, no rubs, no gallops  Respiratory: Diminished breath sounds at the bases but clear to auscultation.  Abdomen: Soft/+BS, non tender, non distended, no guarding  Extremities: trace LE edema, No lymphangitis, No petechiae, No rashes, no synovitis  Data Reviewed: Basic Metabolic Panel:  Recent Labs Lab 09/22/14 1015 09/27/14 0532 09/28/14 0513  NA 137 138 138  K 3.6 3.4* 3.3*  CL 105 110 110  CO2 24 17* 19  GLUCOSE 154* 111* 119*  BUN 13 28* 16  CREATININE 0.64 1.05 0.69  CALCIUM 8.9 8.6 8.3*   Liver Function Tests:  Recent Labs Lab 09/27/14 0532  AST 17  ALT 17  ALKPHOS 79  BILITOT 0.6  PROT 6.2  ALBUMIN 2.7*   No results for input(s): LIPASE, AMYLASE in the last 168 hours. No results for input(s): AMMONIA in the last 168 hours. CBC:  Recent Labs Lab 09/22/14 1015 09/27/14 0532 09/28/14 0513  WBC 13.5* 15.9* 11.4*  NEUTROABS  --  13.0*  --   HGB 9.4* 10.1* 8.6*  HCT 29.1* 31.0* 27.1*  MCV 98.6 100.3* 101.1*  PLT 372 463* 437*   Cardiac Enzymes: No results for input(s): CKTOTAL, CKMB, CKMBINDEX, TROPONINI in the last 168 hours. BNP: Invalid input(s): POCBNP CBG:  Recent Labs Lab 09/22/14 0706 09/22/14 1220 09/27/14 1651 09/27/14 2203 09/28/14 0655  GLUCAP 164*  164* 124* 101* 128*    No results found for this or any previous visit (from the past 240 hour(s)).   Scheduled Meds: . [START ON 09/29/2014] aspirin  325 mg Oral Daily  .  ceFAZolin (ANCEF) IV  2 g Intravenous Q6H  . insulin aspart  0-5 Units Subcutaneous QHS  . insulin aspart  0-9 Units Subcutaneous TID WC  . lisinopril  10 mg Oral Daily  . pantoprazole  40 mg Oral Daily  . rosuvastatin  20 mg Oral Daily  . senna  1 tablet Oral BID   Continuous Infusions:    Chanika Byland, DO  [facility] Pager [phone]  If 7PM-7AM, please contact night-coverage [email or web address] Password [email or web address] 09/28/2014, 3:08 PM   LOS: 1 day

## 2014-09-29 ENCOUNTER — Encounter (HOSPITAL_COMMUNITY): Payer: Self-pay | Admitting: Orthopedic Surgery

## 2014-09-29 DIAGNOSIS — I10 Essential (primary) hypertension: Secondary | ICD-10-CM

## 2014-09-29 DIAGNOSIS — E1165 Type 2 diabetes mellitus with hyperglycemia: Secondary | ICD-10-CM

## 2014-09-29 LAB — CBC
HCT: 24.7 % — ABNORMAL LOW (ref 36.0–46.0)
HEMOGLOBIN: 7.9 g/dL — AB (ref 12.0–15.0)
MCH: 32.5 pg (ref 26.0–34.0)
MCHC: 32 g/dL (ref 30.0–36.0)
MCV: 101.6 fL — ABNORMAL HIGH (ref 78.0–100.0)
PLATELETS: 399 10*3/uL (ref 150–400)
RBC: 2.43 MIL/uL — ABNORMAL LOW (ref 3.87–5.11)
RDW: 14.8 % (ref 11.5–15.5)
WBC: 12 10*3/uL — ABNORMAL HIGH (ref 4.0–10.5)

## 2014-09-29 LAB — BASIC METABOLIC PANEL
Anion gap: 13 (ref 5–15)
BUN: 11 mg/dL (ref 6–23)
CHLORIDE: 109 mmol/L (ref 96–112)
CO2: 17 mmol/L — ABNORMAL LOW (ref 19–32)
Calcium: 8.3 mg/dL — ABNORMAL LOW (ref 8.4–10.5)
Creatinine, Ser: 0.75 mg/dL (ref 0.50–1.10)
GFR calc non Af Amer: 77 mL/min — ABNORMAL LOW (ref >90)
GFR, EST AFRICAN AMERICAN: 90 mL/min — AB (ref >90)
Glucose, Bld: 153 mg/dL — ABNORMAL HIGH (ref 70–99)
POTASSIUM: 3.7 mmol/L (ref 3.5–5.1)
SODIUM: 139 mmol/L (ref 135–145)

## 2014-09-29 LAB — GLUCOSE, CAPILLARY
Glucose-Capillary: 169 mg/dL — ABNORMAL HIGH (ref 70–99)
Glucose-Capillary: 150 mg/dL — ABNORMAL HIGH (ref 70–99)
Glucose-Capillary: 231 mg/dL — ABNORMAL HIGH (ref 70–99)
Glucose-Capillary: 183 mg/dL — ABNORMAL HIGH (ref 70–99)
Glucose-Capillary: 149 mg/dL — ABNORMAL HIGH (ref 70–99)

## 2014-09-29 LAB — VITAMIN B12: Vitamin B-12: 403 pg/mL (ref 211–911)

## 2014-09-29 LAB — MAGNESIUM: MAGNESIUM: 1.6 mg/dL (ref 1.5–2.5)

## 2014-09-29 MED ORDER — LORAZEPAM 0.5 MG PO TABS
0.5000 mg | ORAL_TABLET | Freq: Four times a day (QID) | ORAL | Status: DC | PRN
  Administered 2014-09-29 – 2014-09-30 (×4): 0.5 mg via ORAL
  Filled 2014-09-29 (×4): qty 1

## 2014-09-29 MED ORDER — HALOPERIDOL 5 MG PO TABS
5.0000 mg | ORAL_TABLET | Freq: Four times a day (QID) | ORAL | Status: DC | PRN
  Filled 2014-09-29: qty 1

## 2014-09-29 NOTE — Evaluation (Signed)
Physical Therapy Evaluation Patient Details Name: Tina [name] MRN: [id number] DOB: 04/28/1931 Today's Date: 09/29/2014   History of Present Illness  Pt is an 81 y/o F just d/c 3/28 having [facility] for lumbar stenosis and claudication.  [facility]/p fall at [facility] w/ L hip fracture and L hip hemiarthroplasty on 09/28/14.  Pt's PMH includes DM, HTN, osteopenia, PVD, arthritis, SOB on exertion, anemia, and hepatitis A.   Clinical Impression  Patient is s/p above surgery resulting in functional limitations due to the deficits listed below (see PT Problem List). Pt's AMS limited pt's ability to participate in PT today and to answer questions about home living.  Pt agrees to try sitting EOB and is able to roll to sidelying w/ max assist; however, once attempting sidelying>sitting EOB pt begins to yell at PT to bring her back into bed.  Total assist was required during this transfer sidelying>sitting EOB using bed pad and moving BLEs.  This transfer was attempted x2 w/ the same result both times.  Educated pt on the importance of moving to reduce stiffness and pain and pt verbalized understanding.  Patient will benefit from skilled PT to increase their independence and safety with mobility to allow discharge to the venue listed below.  PT will continue to follow acutely.     Follow Up Recommendations SNF;Supervision/Assistance - 24 hour    Equipment Recommendations  Rolling walker with 5" wheels    Recommendations for Other Services       Precautions / Restrictions Precautions Precautions: Fall;Posterior Hip Precaution Booklet Issued: Yes (comment) Precaution Comments: Provided and reviewed 3/3 post hip precautions.  Restrictions Weight Bearing Restrictions: Yes (WBAT LLE)      Mobility  Bed Mobility Overal bed mobility: Needs Assistance;+2 for physical assistance Bed Mobility: Rolling Rolling: Total assist         General bed  mobility comments: Attempted supine>sit, providing pt w/ verbal and tactile cues for proper sequencing.  Pt required total assist of LLE to Abd it in bed.  PT used bed pad to attempt sidelying>sit but pt began yelling at PT to bring her back to lying supine.  Sidelying>sit attempted x2 w/ pt saying "ok let's try it" and then yelling at PT once transfer attempted.  Transfers                    Ambulation/Gait                Stairs            Wheelchair Mobility    Modified Rankin (Stroke Patients Only)       Balance                                             Pertinent Vitals/Pain Pain Assessment: 0-10 Pain Score: 10-Worst pain ever Pain Location: lower back and L hip Pain Descriptors / Indicators: Aching;Constant;Discomfort Pain Intervention(s): Limited activity within patient's tolerance;Monitored during session;Repositioned    Home Living Family/patient expects to be discharged to:: Private residence Living Arrangements: Other relatives (brother) Available Help at Discharge: Other (Comment) (Pt w/ AMS reports brother avail 24/7) Type of Home: House Home Access: Stairs to enter Entrance Stairs-Rails: Can reach both Entrance Stairs-Number of Steps: 13 (pt reports "13" and then "26") Home Layout: Two level ("I can't remember" when asked  if can live on 1st floor) Home Equipment: Dan Humphreys - 4 wheels;Cane - single point Additional Comments: Pt's AMS limited her ability to answer all home living questions (h/o Alzheimer's)    Prior Function Level of Independence: Independent with assistive device(s) (using rollator everywhere per pt)               Hand Dominance   Dominant Hand: Right    Extremity/Trunk Assessment               Lower Extremity Assessment: Generalized weakness;LLE deficits/detail;Difficult to assess due to impaired cognition   LLE Deficits / Details: 2/5 L hip flexion, 3/5 L knee extension, 3/5 L DF and  PF     Communication   Communication: No difficulties  Cognition Arousal/Alertness: Lethargic (Pt required max verbal cues to keep eyes open during session) Behavior During Therapy: Flat affect;Agitated Overall Cognitive Status: No family/caregiver present to determine baseline cognitive functioning (h/o Alzheimer's ) Area of Impairment: Attention;Orientation;Memory;Following commands;Safety/judgement;Problem solving Orientation Level: Disoriented to;Place;Time;Situation Current Attention Level: Divided Memory: Decreased short-term memory (Thinks she is in her home in [place]) Following Commands: Follows one step commands inconsistently;Follows one step commands with increased time Safety/Judgement: Decreased awareness of safety;Decreased awareness of deficits   Problem Solving: Slow processing;Decreased initiation;Difficulty sequencing;Requires verbal cues;Requires tactile cues General Comments: Pt yelling at staff w/ bed mobility    General Comments General comments (skin integrity, edema, etc.): Despite explaining to the pt the benefit of moving pt resists PT assist to get sitting EOB.      Exercises Total Joint Exercises Ankle Circles/Pumps: AROM;Both;10 reps;Supine Quad Sets: AROM;Both;5 reps;Supine Heel Slides: PROM;Left;5 reps;Supine      Assessment/Plan    PT Assessment Patient needs continued PT services  PT Diagnosis Difficulty walking;Abnormality of gait;Generalized weakness;Acute pain   PT Problem List Decreased strength;Decreased range of motion;Decreased activity tolerance;Decreased balance;Decreased mobility;Decreased coordination;Decreased cognition;Decreased knowledge of use of DME;Decreased knowledge of precautions;Decreased safety awareness;Pain  PT Treatment Interventions DME instruction;Gait training;Stair training;Functional mobility training;Therapeutic activities;Therapeutic exercise;Balance training;Neuromuscular re-education;Cognitive  remediation;Patient/family education;Modalities   PT Goals (Current goals can be found in the Care Plan section) Acute Rehab PT Goals Patient Stated Goal: pt unable to state; highly anxious and incr pain PT Goal Formulation: Patient unable to participate in goal setting (2/2 AMS) Time For Goal Achievement: 10/06/14 Potential to Achieve Goals: Fair    Frequency Min 3X/week   Barriers to discharge Inaccessible home environment;Decreased caregiver support  (Pt reports 13-26 stairs to get into home; assist intermitten)    Co-evaluation               End of Session   Activity Tolerance: No increased pain;Treatment limited secondary to agitation Patient left: in bed;with call bell/phone within reach;with SCD's reapplied (w/ abduction pillow in place) Nurse Communication: Mobility status;Precautions;Weight bearing status         Time: 1126-1200 PT Time Calculation (min) (ACUTE ONLY): 34 min   Charges:   PT Evaluation $Initial PT Evaluation Tier I: 1 Procedure PT Treatments $Therapeutic Exercise: 8-22 mins   PT G CodesMichail Jewels PT, [place] [phone]  [phone] 09/29/2014, 12:22 PM

## 2014-09-29 NOTE — Care Management Note (Signed)
CARE MANAGEMENT NOTE 09/29/2014  Patient:  Tina [name],Tina [name]   Account Number:  [id number]  Date Initiated:  09/29/2014  Documentation initiated by:  Vance PeperBRADY,Kelii Chittum  Subjective/Objective Assessment:   81 yr old female admitted s/p fall with [name] left femoral neck fracture. Patient had [name] left hemiarthroplasty. patient was at [facility] [place].     Action/Plan:   [facility] Social worker is aware.   Anticipated DC Date:  09/30/2014   Anticipated DC Plan:  SKILLED NURSING FACILITY  In-house referral  Clinical Social Worker      DC Planning Services  CM consult      [facility]  NA   Choice offered to / List presented to:     DME arranged  NA        HH arranged  NA      Status of service:  Completed, signed off Medicare Important Message given?   (If response is "NO", the following Medicare IM given date fields will be blank) Date Medicare IM given:   Medicare IM given by:   Date Additional Medicare IM given:   Additional Medicare IM given by:    Discharge Disposition:  SKILLED NURSING FACILITY  Per UR Regulation:  Reviewed for med. necessity/level of care/duration of stay

## 2014-09-29 NOTE — Progress Notes (Signed)
OT Cancellation Note  Patient Details Name: Tina [name] MRN: [id number] DOB: 07/07/1930   Cancelled Treatment:    Reason Eval/Treat Not Completed: Other (comment) Pt is Medicare and current D/C plan is SNF. No apparent immediate acute care OT needs, therefore will defer OT to SNF. If OT eval is needed please call Acute Rehab Dept. at [phone] or text page OT at [phone].  Tina [name],Tina [name]  Tina [name], OTR/L  [phone] 09/29/2014 09/29/2014, 1:34 PM

## 2014-09-29 NOTE — Care Management Utilization Note (Signed)
Utilization review completed.  

## 2014-09-29 NOTE — Progress Notes (Signed)
     Subjective:  POD#1 left hip hemiarthroplasty. Patient reports pain as mild.  Patient resting comfortably in bed.  Objective:   VITALS:   Filed Vitals:   09/29/14 0000 09/29/14 0040 09/29/14 0400 09/29/14 0627  BP:  135/45  149/38  Pulse:  94  96  Temp:  98.1 F (36.7 C)  97.4 F (36.3 C)  TempSrc:  Oral  Oral  Resp: 16 18 18 18   SpO2: 100% 93% 98% 98%    Neurologically intact ABD soft Neurovascular intact Sensation intact distally Intact pulses distally Dorsiflexion/Plantar flexion intact Incision: dressing C/D/I   Lab Results  Component Value Date   WBC 12.0* 09/29/2014   HGB 7.9* 09/29/2014   HCT 24.7* 09/29/2014   MCV 101.6* 09/29/2014   PLT 399 09/29/2014   BMET    Component Value Date/Time   NA 139 09/29/2014 0533   K 3.7 09/29/2014 0533   CL 109 09/29/2014 0533   CO2 17* 09/29/2014 0533   GLUCOSE 153* 09/29/2014 0533   BUN 11 09/29/2014 0533   CREATININE 0.75 09/29/2014 0533   CALCIUM 8.3* 09/29/2014 0533   GFRNONAA 77* 09/29/2014 0533   GFRAA 90* 09/29/2014 0533     Assessment/Plan: 1 Day Post-Op   Principal Problem:   Fracture of femoral neck, left Active Problems:   Lumbar stenosis with neurogenic claudication   Leukocytosis   Encephalopathy   DM type 2 (diabetes mellitus, type 2)   Normocytic anemia   Closed subcapital fracture of femur   Closed left hip fracture   Macrocytic anemia   Essential hypertension   Altered mental status   Up with therapy WBAT in the LLE Posterior hip precautions DVT prophylaxis per medicine   Tina [name] Marie 09/29/2014, 8:07 AM Cell [phone]

## 2014-09-29 NOTE — Progress Notes (Signed)
PROGRESS NOTE  Tina [name] ZOX:[id number] DOB: 1930-07-22 DOA: 09/27/2014 PCP: Fredirick Maudlin, MD  Brief history 81 year old woman just discharged 3/28 having undergone L4-5 laminotomies at [facility] for lumbar stenosis with neurogenic claudication who presented to the emergency department after a fall at [facility] nursing home when she was found on the floor. Noted to be confused. Imaging revealed a fracture. The patient was transferred to [facility] for operative intervention. She has had mild memory loss or several months. This became much worse after fall in March and persisted postoperatively suggesting delirium superimposed on dementia. According to chart, she was found on the floor at the nursing home. Review of the patient's last hospitalization was notable for periods of agitation and anxiety, refusal to work with therapy and some outbursts Assessment/Plan: Left subcapital femoral neck fracture -09/28/14--L- hip hemiarthropasty -ASA for DVT prophylaxis per ortho -PT eval-->SNF -pain control Leukocytosis -Likely reactive due to stress demargination -Afebrile and hemodynamically stable -We will not start antibiotics at this time Diabetes mellitus type 2 -NovoLog sliding scale -09/12/2014 hemoglobin A1c 5.8 -Hold outpatient pioglitazone/glimiperide -hold januvia Anxiety -Ativan 0.5mg  prn Dementia -haldol  q6 hrs prn aggitation HTN -Continue lisinopril -Hold amlodipine--BP remains acceptable lumbar stenosis with left leg radiculopathy/neurogenic claudication Status post left L4-5 foraminotomy per Dr. Jeral Fruit 09/17/2014. Hypokalemia -replete -check mag--1.6 Macrocytic anemia -B12--403 -RBC folate--pending   Family Communication: No family at beside Disposition Plan: SNF  09/29/13 if stable       Procedures/Studies: Ct Head Wo Contrast  09/27/2014   CLINICAL DATA:  Acute onset of altered mental status and confusion. Dizziness, weakness and  headache. Status post fall. Initial encounter.  EXAM: CT HEAD WITHOUT CONTRAST  CT CERVICAL SPINE WITHOUT CONTRAST  TECHNIQUE: Multidetector CT imaging of the head and cervical spine was performed following the standard protocol without intravenous contrast. Multiplanar CT image reconstructions of the cervical spine were also generated.  COMPARISON:  None.  FINDINGS: CT HEAD FINDINGS  There is no evidence of acute infarction, mass lesion, or intra- or extra-axial hemorrhage on CT.  Prominence of the ventricles and sulci reflects moderate cortical volume loss. Cerebellar atrophy is noted. Scattered periventricular and subcortical white matter change likely reflects small vessel ischemic microangiopathy.  The brainstem and fourth ventricle are within normal limits. The basal ganglia are unremarkable in appearance. The cerebral hemispheres demonstrate grossly normal gray-white differentiation. No mass effect or midline shift is seen.  There is no evidence of fracture; visualized osseous structures are unremarkable in appearance. The orbits are within normal limits. The paranasal sinuses and mastoid air cells are well-aerated. No significant soft tissue abnormalities are seen.  CT CERVICAL SPINE FINDINGS  There is no evidence of fracture or subluxation. Vertebral bodies demonstrate normal height and alignment. Multilevel disc space narrowing is noted along the cervical spine, with scattered anterior and posterior disc osteophyte complexes. Prevertebral soft tissues are within normal limits.  The thyroid gland is unremarkable in appearance. The visualized lung apices are clear. Dense calcification is noted at the carotid bifurcations bilaterally.  IMPRESSION: 1. No evidence of traumatic intracranial injury or fracture. 2. No evidence of fracture or subluxation along the cervical spine. 3. Moderate cortical volume loss and scattered small vessel ischemic microangiopathy. 4. Mild diffuse degenerative change along the  cervical spine. 5. Dense calcification at the carotid bifurcations bilaterally. Carotid ultrasound would be helpful for further evaluation, when and as deemed clinically appropriate.   Electronically Signed   By: Leotis Shames  Chang M.D.   On: 09/27/2014 06:32   Ct Cervical Spine Wo Contrast  09/27/2014   CLINICAL DATA:  Acute onset of altered mental status and confusion. Dizziness, weakness and headache. Status post fall. Initial encounter.  EXAM: CT HEAD WITHOUT CONTRAST  CT CERVICAL SPINE WITHOUT CONTRAST  TECHNIQUE: Multidetector CT imaging of the head and cervical spine was performed following the standard protocol without intravenous contrast. Multiplanar CT image reconstructions of the cervical spine were also generated.  COMPARISON:  None.  FINDINGS: CT HEAD FINDINGS  There is no evidence of acute infarction, mass lesion, or intra- or extra-axial hemorrhage on CT.  Prominence of the ventricles and sulci reflects moderate cortical volume loss. Cerebellar atrophy is noted. Scattered periventricular and subcortical white matter change likely reflects small vessel ischemic microangiopathy.  The brainstem and fourth ventricle are within normal limits. The basal ganglia are unremarkable in appearance. The cerebral hemispheres demonstrate grossly normal gray-white differentiation. No mass effect or midline shift is seen.  There is no evidence of fracture; visualized osseous structures are unremarkable in appearance. The orbits are within normal limits. The paranasal sinuses and mastoid air cells are well-aerated. No significant soft tissue abnormalities are seen.  CT CERVICAL SPINE FINDINGS  There is no evidence of fracture or subluxation. Vertebral bodies demonstrate normal height and alignment. Multilevel disc space narrowing is noted along the cervical spine, with scattered anterior and posterior disc osteophyte complexes. Prevertebral soft tissues are within normal limits.  The thyroid gland is unremarkable in  appearance. The visualized lung apices are clear. Dense calcification is noted at the carotid bifurcations bilaterally.  IMPRESSION: 1. No evidence of traumatic intracranial injury or fracture. 2. No evidence of fracture or subluxation along the cervical spine. 3. Moderate cortical volume loss and scattered small vessel ischemic microangiopathy. 4. Mild diffuse degenerative change along the cervical spine. 5. Dense calcification at the carotid bifurcations bilaterally. Carotid ultrasound would be helpful for further evaluation, when and as deemed clinically appropriate.   Electronically Signed   By: Roanna RaiderJeffery  Chang M.D.   On: 09/27/2014 06:32   Ct Lumbar Spine W Contrast  09/15/2014   CLINICAL DATA:  Left leg pain and weakness. Spondylosis without myelopathy.  EXAM: LUMBAR MYELOGRAM  FLUOROSCOPY TIME:  2 minutes 0 seconds. Two thousand four hundred twenty-seven micro gray meter squared  PROCEDURE: After thorough discussion of risks and benefits of the procedure including bleeding, infection, injury to nerves, blood vessels, adjacent structures as well as headache and CSF leak, written and oral informed consent was obtained. Consent was obtained by Dr. Paulina FusiMark Shogry. Time out form was completed.  Patient was positioned prone on the fluoroscopy table. Local anesthesia was provided with 1% lidocaine without epinephrine after prepped and draped in the usual sterile fashion. Puncture was performed at L5-S1 using a 5 inch 22-gauge spinal needle via a midline approach. Using a single pass through the dura, the needle was placed within the thecal sac, with return of clear CSF. Eighteen of all was injected into the thecal sac, with normal opacification of the nerve roots and cauda equina consistent with free flow within the subarachnoid space.  I personally performed the lumbar puncture and administered the intrathecal contrast. I also personally performed acquisition of the myelogram images.  TECHNIQUE: Contiguous axial  images were obtained through the Lumbar spine after the intrathecal infusion of infusion. Coronal and sagittal reconstructions were obtained of the axial image sets.  COMPARISON:  MRI 09/08/2014  FINDINGS: LUMBAR MYELOGRAM FINDINGS:  There are anterior  extradural defects throughout the lumbar region, most pronounced at L4-5. There is stenosis of the left lateral recess at L4-5 and diminished filling of the left L4 root sleeve. No other focal nerve root compression is identified.  CT LUMBAR MYELOGRAM FINDINGS:  T12-L1: Disc degeneration. Mild bulging. No stenosis. Conus tip at mid L1.  L1-2:  Minimal bulging of the disc.  No stenosis.  L2-3: Previous posterior decompression. Disc degeneration. Endplate osteophytes. No central canal stenosis. Foraminal stenosis bilaterally, right worse than left.  L3-4: Previous posterior decompression. Chronic disc degeneration. Endplate osteophytes. No central canal stenosis. Foraminal narrowing bilaterally, right more than left.  L4-5: Previous posterior decompression. Disc degeneration with endplate osteophytes more prominent towards the left. No central canal stenosis. No foraminal stenosis on the right. Severe foraminal stenosis on the left because of bony encroachment. Left L4 nerve root compression seems likely.  L5-S1: Mild bulging of the disc. Bilateral facet degeneration. No central canal stenosis. No S1 nerve compression. Mild foraminal narrowing bilaterally without compression of the exiting L5 nerve roots.  IMPRESSION: Previous posterior decompression at L2-3, L3-4 and L4-5. No central canal stenosis.  Severe left foraminal stenosis at L4-5 because of encroachment by bony material. Left L4 nerve root would likely be compressed.  Less pronounced foraminal narrowing on the left at L2-3 and L3-4 and on the right at L2-3, L3-4 and L4-5.   Electronically Signed   By: Paulina Fusi M.D.   On: 09/15/2014 15:17   Mr Lumbar Spine Wo Contrast  09/08/2014   CLINICAL DATA:  Left  leg pain. Lower extremity weakness. Previous lumbar decompression. Multiple falls at home in the last week.  EXAM: MRI LUMBAR SPINE WITHOUT CONTRAST  TECHNIQUE: Multiplanar, multisequence MR imaging of the lumbar spine was performed. No intravenous contrast was administered.  COMPARISON:  Radiography 08/19/2014.  MRI 03/04/2014.  FINDINGS: There is mild curvature convex to the left with the apex at L3.  T11-12:  Small disc bulge without neural compression.  T12-L1: Small disc bulge without neural compression.  L1-2:  Normal interspace.  L2-3: Previous posterior decompression. Endplate osteophytes and bulging of the disc. Mild narrowing of the lateral recesses without gross neural compression. Mild foraminal stenosis bilaterally.  L3-4: Previous posterior decompression. Endplate osteophytes and mild bulging of the disc. Mild stenosis of both neural foramina without gross neural compression.  L4-5: Previous posterior decompression. Endplate osteophytes and shallow protrusion of disc material. Narrowing of the lateral recesses. Foraminal narrowing left more than right.  L5-S1: Previous posterior decompression. Minimal bulging of the disc. Mild facet degeneration. No significant stenosis.  Compared to the previous study, no change is seen since September 2015.  IMPRESSION: No change since September 2015. Posterior decompression at L2-3, L3-4, L4-5 and L5-S1. No compressive central canal stenosis. Narrowing of the lateral recesses and foramina at L4-5 could be symptomatic, but is unchanged since 11-11-13. Other less pronounced degenerative changes as outlined above.   Electronically Signed   By: Paulina Fusi M.D.   On: 09/08/2014 09:35   Dg Lumbar Spine 1 View  09/17/2014   CLINICAL DATA:  L4-5 foraminotomy  EXAM: DG C-ARM 61-120 MIN; LUMBAR SPINE - 1 VIEW  COMPARISON:  None.  FLUOROSCOPY TIME:  Radiation Exposure Index (as provided by the fluoroscopic device):  If the device does not provide the exposure index:   Fluoroscopy Time:  5 seconds  Number of Acquired Images:  0  FINDINGS: Single cross-table lateral view of the lower lumbar spine demonstrates posterior surgical instruments directed at  the L4-5 and L5-S1 levels.  IMPRESSION: Intraoperative localization as above.   Electronically Signed   By: Charlett Nose M.D.   On: 09/17/2014 11:19   Dg Lumbar Spine 1 View  09/17/2014   CLINICAL DATA:  Left L4-5 foraminotomy  EXAM: LUMBAR SPINE - 1 VIEW  COMPARISON:  Myelogram of September 15, 2014 and lumbar spine series of August 19, 2014  FINDINGS: The images are suboptimal and it is not possible to accurately localize the lumbosacral junction. Numbering was performed using the lower most rib-bearing vertebral body as T12. The metallic trocar appears to lie approximately 1.5 cm posterior to the L3-L4 disc level.  IMPRESSION: Surgical metallic localization device lying approximately 1.5 cm posterior to the L3-L4 disc space.   Electronically Signed   By: Kimi Bordeau  [place]   On: 09/17/2014 10:04   Pelvis Portable  09/28/2014   CLINICAL DATA:  Right hip fracture. Postop from right hip arthroplasty.  EXAM: PORTABLE PELVIS 1-2 VIEWS  COMPARISON:  09/27/2014  FINDINGS: Left hip prosthesis seen in expected position. No evidence of fracture or dislocation.  IMPRESSION: Expected postoperative appearance of left hip prosthesis. No evidence of fracture or dislocation.   Electronically Signed   By: Myles Rosenthal M.D.   On: 09/28/2014 13:59   Dg Pelvis Portable  09/27/2014   CLINICAL DATA:  Status post fall on hard surface floor. Sharp left hip pain. Initial encounter.  EXAM: PORTABLE PELVIS 1-2 VIEWS  COMPARISON:  Lumbar spine CT performed 09/15/2014  FINDINGS: Degenerative change is noted at the left hip joint, with diffuse sclerosis and remodeling of the left femoral head. An underlying femoral head fracture cannot be excluded, but is not definitely seen.  More mild right hip joint sclerosis is seen. Diffuse degenerative change is noted  along the lumbar spine. The sacroiliac joints are unremarkable in appearance.  The visualized bowel gas pattern is grossly unremarkable in appearance. Scattered phleboliths are noted within the pelvis.  IMPRESSION: 1. Degenerative change at the left hip joint, with diffuse sclerosis and bony remodeling of the left femoral head. Underlying femoral head fracture cannot be excluded, but is not definitely characterized on this single image. This could be better characterized on dedicated left hip radiographs, as deemed clinically appropriate. 2. Minimal right hip degenerative change noted. Diffuse degenerative change along the lumbar spine.   Electronically Signed   By: Roanna Raider M.D.   On: 09/27/2014 06:23   Ir Fluoro Guide Ndl Plmt / Bx  09/10/2014   CLINICAL DATA:  Lumbosacral spondylosis without myelopathy  FLUOROSCOPY TIME:  36 seconds.  PROCEDURE: CAUDAL EPIDURAL INJECTION  Utilizing a caudal approach, the skin overlying the sacral hiatus was cleansed and anesthetized. A 20 gauge Crawford epidural needle was advanced into the sacral epidural space. Injection of Omnipaque 180 shows a good epidural pattern with spread up to L5-S1. No vascular opacification is seen.  120 mg of Depo-Medrol mixed with 5 cc of normal saline and 3 cc of 1% Lidocaine were instilled. The procedure was well-tolerated, and the patient was discharged thirty minutes following the injection in good condition.  IMPRESSION: Technically successful caudal epidural injection.   Electronically Signed   By: Jolaine Click M.D.   On: 09/10/2014 15:37   Dg Chest Portable 1 View  09/27/2014   CLINICAL DATA:  Larey Seat onto hard surface floor. Concern for chest injury. Initial encounter.  EXAM: PORTABLE CHEST - 1 VIEW  COMPARISON:  Chest radiograph performed 09/19/2014  FINDINGS: The lungs are well-aerated. Minimal right-sided  atelectasis is noted. There is no evidence of pleural effusion or pneumothorax.  The cardiomediastinal silhouette is mildly  enlarged. No acute osseous abnormalities are seen. Degenerative change is noted at the acromioclavicular joints bilaterally.  IMPRESSION: Minimal right-sided atelectasis noted. Mild cardiomegaly. No displaced rib fracture seen.   Electronically Signed   By: Roanna Raider M.D.   On: 09/27/2014 06:20   Dg Chest Port 1 View  09/19/2014   CLINICAL DATA:  Leukocytosis.  EXAM: PORTABLE CHEST - 1 VIEW  COMPARISON:  07/05/2013.  FINDINGS: Mediastinum and hilar structures normal. Mild bibasilar subsegmental atelectasis and/or scarring again noted. Minimal infiltrate in the left base cannot be excluded. No pleural effusion or pneumothorax. Heart size normal. No acute bony abnormality .  IMPRESSION: Mild bibasilar subsegmental atelectasis and/or scarring again noted. Minimal infiltrate in the left lung base cannot be excluded.   Electronically Signed   By: Maisie Fus  Register   On: 09/19/2014 09:34   Dg C-arm 1-60 Min  09/17/2014   CLINICAL DATA:  L4-5 foraminotomy  EXAM: DG C-ARM 61-120 MIN; LUMBAR SPINE - 1 VIEW  COMPARISON:  None.  FLUOROSCOPY TIME:  Radiation Exposure Index (as provided by the fluoroscopic device):  If the device does not provide the exposure index:  Fluoroscopy Time:  5 seconds  Number of Acquired Images:  0  FINDINGS: Single cross-table lateral view of the lower lumbar spine demonstrates posterior surgical instruments directed at the L4-5 and L5-S1 levels.  IMPRESSION: Intraoperative localization as above.   Electronically Signed   By: Charlett Nose M.D.   On: 09/17/2014 11:19   Dg Myelography Lumbar Inj Lumbosacral  09/15/2014   CLINICAL DATA:  Left leg pain and weakness. Spondylosis without myelopathy.  EXAM: LUMBAR MYELOGRAM  FLUOROSCOPY TIME:  2 minutes 0 seconds. Two thousand four hundred twenty-seven micro gray meter squared  PROCEDURE: After thorough discussion of risks and benefits of the procedure including bleeding, infection, injury to nerves, blood vessels, adjacent structures as well  as headache and CSF leak, written and oral informed consent was obtained. Consent was obtained by Dr. Paulina Fusi. Time out form was completed.  Patient was positioned prone on the fluoroscopy table. Local anesthesia was provided with 1% lidocaine without epinephrine after prepped and draped in the usual sterile fashion. Puncture was performed at L5-S1 using a 5 inch 22-gauge spinal needle via a midline approach. Using a single pass through the dura, the needle was placed within the thecal sac, with return of clear CSF. Eighteen of all was injected into the thecal sac, with normal opacification of the nerve roots and cauda equina consistent with free flow within the subarachnoid space.  I personally performed the lumbar puncture and administered the intrathecal contrast. I also personally performed acquisition of the myelogram images.  TECHNIQUE: Contiguous axial images were obtained through the Lumbar spine after the intrathecal infusion of infusion. Coronal and sagittal reconstructions were obtained of the axial image sets.  COMPARISON:  MRI 09/08/2014  FINDINGS: LUMBAR MYELOGRAM FINDINGS:  There are anterior extradural defects throughout the lumbar region, most pronounced at L4-5. There is stenosis of the left lateral recess at L4-5 and diminished filling of the left L4 root sleeve. No other focal nerve root compression is identified.  CT LUMBAR MYELOGRAM FINDINGS:  T12-L1: Disc degeneration. Mild bulging. No stenosis. Conus tip at mid L1.  L1-2:  Minimal bulging of the disc.  No stenosis.  L2-3: Previous posterior decompression. Disc degeneration. Endplate osteophytes. No central canal stenosis. Foraminal stenosis bilaterally, right  worse than left.  L3-4: Previous posterior decompression. Chronic disc degeneration. Endplate osteophytes. No central canal stenosis. Foraminal narrowing bilaterally, right more than left.  L4-5: Previous posterior decompression. Disc degeneration with endplate osteophytes more  prominent towards the left. No central canal stenosis. No foraminal stenosis on the right. Severe foraminal stenosis on the left because of bony encroachment. Left L4 nerve root compression seems likely.  L5-S1: Mild bulging of the disc. Bilateral facet degeneration. No central canal stenosis. No S1 nerve compression. Mild foraminal narrowing bilaterally without compression of the exiting L5 nerve roots.  IMPRESSION: Previous posterior decompression at L2-3, L3-4 and L4-5. No central canal stenosis.  Severe left foraminal stenosis at L4-5 because of encroachment by bony material. Left L4 nerve root would likely be compressed.  Less pronounced foraminal narrowing on the left at L2-3 and L3-4 and on the right at L2-3, L3-4 and L4-5.   Electronically Signed   By: Paulina Fusi M.D.   On: 09/15/2014 15:17   Dg Hip Unilat With Pelvis 2-3 Views Left  09/27/2014   CLINICAL DATA:  Left hip pain following fall, initial encounter  EXAM: LEFT HIP (WITH PELVIS) 2-3 VIEWS  COMPARISON:  08/19/2014, 09/27/2014  FINDINGS: There are changes consistent with a subcapital femoral neck fracture with impaction and angulation at the fracture site. The pelvic ring is otherwise intact. Degenerative changes of left hip joint are noted. Degenerative changes of lumbar spine are seen as well.  IMPRESSION: Subcapital femoral neck fracture on the left.  Complete   Electronically Signed   By: Alcide Clever M.D.   On: 09/27/2014 07:28   Dg Hip Unilat With Pelvis 2-3 Views Right  09/28/2014   CLINICAL DATA:  Larey Seat with new right hip pain  EXAM: RIGHT HIP (WITH PELVIS) 2-3 VIEWS  COMPARISON:  09/27/2014  FINDINGS: The left subcapital hip fracture is again evident. The right hip is intact. The pelvis is intact.  IMPRESSION: Left subcapital hip fracture again evident. No evidence of acute fracture involving the right hip.   Electronically Signed   By: Ellery Plunk M.D.   On: 09/28/2014 01:40         Subjective: Patient denies fevers,  chills, headache, chest pain, dyspnea, nausea, vomiting, diarrhea, abdominal pain, dysuria.  C/o L-hip pain   Objective: Filed Vitals:   09/29/14 0800 09/29/14 1200 09/29/14 1430 09/29/14 1600  BP:   122/40   Pulse:   88   Temp:   98.5 F (36.9 C)   TempSrc:      Resp: 16 16 16 16   SpO2: 97% 97% 97% 98%    Intake/Output Summary (Last 24 hours) at 09/29/14 1745 Last data filed at 09/29/14 1230  Gross per 24 hour  Intake    170 ml  Output      0 ml  Net    170 ml   Weight change:  Exam:   General:  Pt is alert, follows commands appropriately, not in acute distress  HEENT: No icterus, No thrush,  [place]/AT  Cardiovascular: RRR, S1/S2, no rubs, no gallops  Respiratory: CTA bilaterally, no wheezing, no crackles, no rhonchi  Abdomen: Soft/+BS, non tender, non distended, no guarding  Extremities: No edema, No lymphangitis, No petechiae, No rashes, no synovitis  Data Reviewed: Basic Metabolic Panel:  Recent Labs Lab 09/27/14 0532 09/28/14 0513 09/29/14 0533  NA 138 138 139  K 3.4* 3.3* 3.7  CL 110 110 109  CO2 17* 19 17*  GLUCOSE 111* 119* 153*  BUN  28* 16 11  CREATININE 1.05 0.69 0.75  CALCIUM 8.6 8.3* 8.3*  MG  --   --  1.6   Liver Function Tests:  Recent Labs Lab 09/27/14 0532  AST 17  ALT 17  ALKPHOS 79  BILITOT 0.6  PROT 6.2  ALBUMIN 2.7*   No results for input(s): LIPASE, AMYLASE in the last 168 hours. No results for input(s): AMMONIA in the last 168 hours. CBC:  Recent Labs Lab 09/27/14 0532 09/28/14 0513 09/29/14 0533  WBC 15.9* 11.4* 12.0*  NEUTROABS 13.0*  --   --   HGB 10.1* 8.6* 7.9*  HCT 31.0* 27.1* 24.7*  MCV 100.3* 101.1* 101.6*  PLT 463* 437* 399   Cardiac Enzymes: No results for input(s): CKTOTAL, CKMB, CKMBINDEX, TROPONINI in the last 168 hours. BNP: Invalid input(s): POCBNP CBG:  Recent Labs Lab 09/28/14 1712 09/28/14 2155 09/29/14 0619 09/29/14 1113 09/29/14 1627  GLUCAP 187* 127* 150* 231* 183*    No results  found for this or any previous visit (from the past 240 hour(s)).   Scheduled Meds: . aspirin  325 mg Oral Daily  . insulin aspart  0-5 Units Subcutaneous QHS  . insulin aspart  0-9 Units Subcutaneous TID WC  . lisinopril  10 mg Oral Daily  . pantoprazole  40 mg Oral Daily  . rosuvastatin  20 mg Oral Daily  . senna  1 tablet Oral BID   Continuous Infusions:    Tina Tiley, DO  [facility] Pager [phone]  If 7PM-7AM, please contact night-coverage [email or web address] Password [email or web address] 09/29/2014, 5:45 PM   LOS: 2 days

## 2014-09-30 LAB — BASIC METABOLIC PANEL
Anion gap: 6 (ref 5–15)
BUN: 11 mg/dL (ref 6–23)
CO2: 25 mmol/L (ref 19–32)
Calcium: 8.1 mg/dL — ABNORMAL LOW (ref 8.4–10.5)
Chloride: 107 mmol/L (ref 96–112)
Creatinine, Ser: 0.67 mg/dL (ref 0.50–1.10)
GFR calc Af Amer: >90 mL/min (ref >90)
GFR calc non Af Amer: 80 mL/min — ABNORMAL LOW (ref >90)
GLUCOSE: 131 mg/dL — AB (ref 70–99)
POTASSIUM: 3.6 mmol/L (ref 3.5–5.1)
Sodium: 138 mmol/L (ref 135–145)

## 2014-09-30 LAB — FOLATE RBC
FOLATE, HEMOLYSATE: 347.1 ng/mL
FOLATE, RBC: 1405 ng/mL (ref >498)
HEMATOCRIT: 24.7 % — AB (ref 34.0–46.6)

## 2014-09-30 LAB — GLUCOSE, CAPILLARY
Glucose-Capillary: 138 mg/dL — ABNORMAL HIGH (ref 70–99)
Glucose-Capillary: 190 mg/dL — ABNORMAL HIGH (ref 70–99)

## 2014-09-30 MED ORDER — LORAZEPAM 0.5 MG PO TABS: 0.5000 mg | ORAL_TABLET | Freq: Four times a day (QID) | ORAL | Status: AC | PRN

## 2014-09-30 MED ORDER — ASPIRIN EC 81 MG PO TBEC: 81.0000 mg | DELAYED_RELEASE_TABLET | Freq: Every day | ORAL | Status: DC

## 2014-09-30 MED ORDER — HYDROCODONE-ACETAMINOPHEN 5-325 MG PO TABS: 1.0000 | ORAL_TABLET | Freq: Four times a day (QID) | ORAL | Status: DC | PRN

## 2014-09-30 MED ORDER — SALINE SPRAY 0.65 % NA SOLN
1.0000 | NASAL | Status: DC | PRN
Start: 2014-09-30 — End: 2014-09-30
  Filled 2014-09-30: qty 44

## 2014-09-30 MED ORDER — ASPIRIN 325 MG PO TABS: 325.0000 mg | ORAL_TABLET | Freq: Every day | ORAL | Status: DC

## 2014-09-30 NOTE — Discharge Planning (Signed)
Patient to be discharged to [facility]. Patient's daughter, [name], updated regarding discharge.  RN to call report to: [phone] Transportation: EMS ([facility])  Marcelline DeistEmily Teryn Gust, [place] Cell: [phone]       Fax: [phone] Clinical Social Work: Orthopedics [phone](5N9-32) and Surgical [phone](6N24-32)

## 2014-09-30 NOTE — Clinical Social Work Psychosocial (Signed)
Clinical Social Work Department BRIEF PSYCHOSOCIAL ASSESSMENT 09/30/2014  Patient:  Tina [name],Tina [name]     Account Number:  [id number]     Admit date:  08/19/2014  Clinical Social Worker:  Mosie EpsteinVAUGHN,Gianlucca Szymborski S, LCSWA  Date/Time:  09/30/2014 03:15 PM  Referred by:  Physician  Date Referred:  09/30/2014 Referred for  SNF Placement   Other Referral:   none.   Interview type:  Family Other interview type:   CSW spoke with patient's daughter, Tina [name], regarding discharge disposition.    PSYCHOSOCIAL DATA Living Status:  FACILITY Admitted from facility:  [facility] Level of care:  Skilled Nursing Facility Primary support name:  Tina [name] Primary support relationship to patient:  CHILD, ADULT Degree of support available:   Strong support system.    CURRENT CONCERNS Current Concerns  Post-Acute Placement   Other Concerns:   none.    SOCIAL WORK ASSESSMENT / PLAN CSW received referral for possible SNF placement at time of discharge. MD office RNCM informed CSW patient [facility]. Patient currently confused and unable to participate in assessment.    CSW spoke with patient's daughter, [name], regarding SNF placement at time of discharge. Patient's daughter expressed concern for patient to return to [facility] as patient has fallen at facility in the past, but agreeable for patient to return at time of discharge. Patient's daughter stated patient's daughter to continue to look for new placement for patient once returned to [facility].    CSW to continue to follow and assist with discharge planning needs.   Assessment/plan status:  Psychosocial Support/Ongoing Assessment of Needs Other assessment/ plan:   none.   Information/referral to community resources[facility]  FL2 on patient's chart for MD signature.    PATIENT'S/FAMILY'S RESPONSE TO PLAN OF CARE: Patient's daughter understanding and agreeable to CSW plan of  care. Patient's daughter expressed no further questions or concerns at this time.       Marcelline DeistEmily Mayjor Ager, [place] Cell: [phone]       Fax: [phone] Clinical Social Work: Orthopedics [phone](5N9-32) and Surgical [phone](6N24-32)

## 2014-09-30 NOTE — Progress Notes (Signed)
Patient left via transport to SNF, medicated as rolling out of the room on the stretcher for pain - Vicodin 2 tabs PO with 240 cc H2O. Smiled and said thank you. Pillow wedge in place to maintain hip precautions. Report called to Nursing facility.

## 2014-09-30 NOTE — Clinical Social Work Placement (Signed)
Clinical Social Work Department CLINICAL SOCIAL WORK PLACEMENT NOTE 09/30/2014  Patient:  Tina BussingCURRAN,Naureen  Account Number:  [id number] Admit date:  08/19/2014  Clinical Social Worker:  Mosie EpsteinEMILY S Shaneque Merkle, LCSWA  Date/time:  09/30/2014 03:24 PM  Clinical Social Work is seeking post-discharge placement for this patient at the following level of care:   SKILLED NURSING   (*CSW will update this form in Epic as items are completed)   09/30/2014  Patient/family provided with [facility] Department of Clinical Social Work's list of facilities offering this level of care within the geographic area requested by the patient (or if unable, by the patient's family).  09/30/2014  Patient/family informed of their freedom to choose among providers that offer the needed level of care, that participate in Medicare, Medicaid or managed care program needed by the patient, have an available bed and are willing to accept the patient.  09/30/2014  Patient/family informed of [facility]' ownership interest in [facility], as well as of the fact that they are under no obligation to receive care at this facility.  PASARR submitted to EDS on  PASARR number received on   FL2 transmitted to all facilities in geographic area requested by pt/family on  09/30/2014 FL2 transmitted to all facilities within larger geographic area on   Patient informed that his/her managed care company has contracts with or will negotiate with  certain facilities, including the following:     Patient/family informed of bed offers received:  09/30/2014 [facility] Physician [facility] and [facility]  [facility]  on  09/30/2014 Patient to be transferred to facility by [facility] Patient and family notified of transfer on 09/30/2014 Name of family member notified:  Hulda MarinAndrea Korpi  The following physician request were entered in  Epic:   Additional Comments: PASARR previously existing.  Marcelline DeistEmily Itzamar Traynor, [place] Cell: [phone]       Fax: [phone] Clinical Social Work: Orthopedics [phone](5N9-32) and Surgical [phone](6N24-32)

## 2014-09-30 NOTE — Progress Notes (Signed)
Physical Therapy Treatment Patient Details Name: Tina [name] MRN: [id number] DOB: 05/03/1931 Today's Date: 09/30/2014    History of Present Illness Pt is an 81 y/o F just d/c 3/28 having [facility] and [facility]audication.  [facility] w/ L hip fracture and L hip hemiarthroplasty on 09/28/14.  Pt's PMH includes DM, HTN, osteopenia, PVD, arthritis, SOB on exertion, anemia, and hepatitis A.     PT Comments    Pt required total assist for sit>squat transfer and was returned to chair as pt was not participating.  Therapeutic exercises were completed in sitting w/ pt participating in LAQ and ankle pumps.  PT recommends sara plus be used at next PT session.  PT contacted RN at end of session to notify her of potential code brown and suggestion to use maxi move for transfers.   Follow Up Recommendations  SNF;Supervision/Assistance - 24 hour     Equipment Recommendations  Rolling walker with 5" wheels    Recommendations for Other Services       Precautions / Restrictions Precautions Precautions: Fall;Posterior Hip Restrictions Weight Bearing Restrictions: Yes LLE Weight Bearing: Weight bearing as tolerated    Mobility  Bed Mobility Overal bed mobility:  (Pt in chair upon arrival)                Transfers Overall transfer level: Needs assistance Equipment used: None Transfers: Sit to/from Stand (Sit>squat) Sit to Stand: Total assist;+2 physical assistance         General transfer comment: Total assist for sit>squat from chair recliner w/ max verbal cues for hand placement.  Total assist using bed pad, gait belt, and knee block anteriorly.  Tranfered to squat position and then back to sitting in chair 2/2 pt not participating in transfer.  Ambulation/Gait                 Stairs            Wheelchair Mobility    Modified Rankin (Stroke Patients Only)       Balance Overall  balance assessment: Needs assistance;History of Falls Sitting-balance support: Bilateral upper extremity supported;Feet supported Sitting balance-Leahy Scale: Poor     Standing balance support: Bilateral upper extremity supported;During functional activity Standing balance-Leahy Scale: Zero                      Cognition Arousal/Alertness: Awake/alert Behavior During Therapy: Flat affect;Agitated Overall Cognitive Status: No family/caregiver present to determine baseline cognitive functioning (h/o Alzheimer's) Area of Impairment: Attention;Orientation;Memory;Following commands;Safety/judgement Orientation Level: Disoriented to;Place;Time;Situation Current Attention Level: Divided Memory: Decreased short-term memory Following Commands: Follows one step commands inconsistently;Follows one step commands with increased time Safety/Judgement: Decreased awareness of safety;Decreased awareness of deficits   Problem Solving: Slow processing;Decreased initiation;Difficulty sequencing;Requires verbal cues;Requires tactile cues General Comments: Pt yelling at staff w/ any mobility    Exercises Total Joint Exercises Ankle Circles/Pumps: AROM;Both;10 reps;Seated Heel Slides: PROM;Left;5 reps;Seated Hip ABduction/ADduction: PROM;AAROM;Left;5 reps;Seated Straight Leg Raises: PROM;Left;5 reps;Seated Long Arc Quad: AROM;Both;10 reps;Seated    General Comments General comments (skin integrity, edema, etc.): Upon sit>squat transfer it smells as though pt has code brown and RN was notified.      Pertinent Vitals/Pain Pain Assessment: Faces Faces Pain Scale: Hurts whole lot Pain Location: L hip and lower back Pain Descriptors / Indicators: Grimacing;Guarding;Moaning Pain Intervention(s): Limited activity within patient's tolerance;Monitored during session;Repositioned    Home Living  Prior Function            PT Goals (current goals can now be found in  the care plan section) Acute Rehab PT Goals Patient Stated Goal: pt unable to state; highly anxious and incr pain Progress towards PT goals: Not progressing toward goals - comment (limited 2/2 pt's h/o Alzheimer's and pt not participating)    Frequency  Min 3X/week    PT Plan Current plan remains appropriate    Co-evaluation             End of Session Equipment Utilized During Treatment: Gait belt Activity Tolerance: Patient limited by fatigue;Patient limited by pain;Treatment limited secondary to agitation Patient left: in chair;with call bell/phone within reach;with chair alarm set     Time: 8295-6213 PT Time Calculation (min) (ACUTE ONLY): 17 min  Charges:  $Therapeutic Exercise: 8-22 mins                    G CodesMichail Jewels PT, [place] [phone]  [phone] 09/30/2014, 1:56 PM

## 2014-09-30 NOTE — Progress Notes (Signed)
     Subjective:  POD#2 left hip hemiarthroplasty. Patient reports pain as mild to moderate.  Resting comfortably in bed. Objective:   VITALS:   Filed Vitals:   09/29/14 2149 09/29/14 2345 09/30/14 0400 09/30/14 0658  BP: 119/44   116/41  Pulse: 93   86  Temp: 98.4 F (36.9 C)   98.4 F (36.9 C)  TempSrc: Oral   Oral  Resp: 18 18 16 16   SpO2: 96% 96% 96% 93%   Confused ABD soft Neurovascular intact Sensation intact distally Intact pulses distally Dorsiflexion/Plantar flexion intact Incision: dressing C/D/I   Lab Results  Component Value Date   WBC 12.0* 09/29/2014   HGB 7.9* 09/29/2014   HCT 24.7* 09/29/2014   MCV 101.6* 09/29/2014   PLT 399 09/29/2014   BMET    Component Value Date/Time   NA 139 09/29/2014 0533   K 3.7 09/29/2014 0533   CL 109 09/29/2014 0533   CO2 17* 09/29/2014 0533   GLUCOSE 153* 09/29/2014 0533   BUN 11 09/29/2014 0533   CREATININE 0.75 09/29/2014 0533   CALCIUM 8.3* 09/29/2014 0533   GFRNONAA 77* 09/29/2014 0533   GFRAA 90* 09/29/2014 0533     Assessment/Plan: 2 Days Post-Op   Principal Problem:   Fracture of femoral neck, left Active Problems:   Lumbar stenosis with neurogenic claudication   Leukocytosis   Encephalopathy   DM type 2 (diabetes mellitus, type 2)   Normocytic anemia   Closed subcapital fracture of femur   Closed left hip fracture   Macrocytic anemia   Essential hypertension   Altered mental status   Up with therapy WBAT in the LLE Posterior hip precautions DVT prophylaxis per medicine Patient was at a rehab facility prior to admission due to recent back surgery.  Plan to transfer back to rehab possibly today if stable from medicine standpoint.  Stable from ortho standpoint.  Ianmichael Amescua Hilda LiasMarie 09/30/2014, 7:40 AM Cell [phone]

## 2014-09-30 NOTE — Discharge Summary (Signed)
Physician Discharge Summary  Tina [name] RUE:[id number] DOB: [birth year] DOA: 09/27/2014  PCP: Fredirick Maudlin, MD  Admit date: 09/27/2014 Discharge date: 09/30/2014  Recommendations for Outpatient Follow-up:  1. Pt will need to follow up with PCP in 2 weeks post discharge 2. Please obtain BMP and CBC in one week   Discharge Diagnoses:  Left subcapital femoral neck fracture -09/28/14--L- hip hemiarthropasty -ASA  x 30 days for DVT prophylaxis per ortho; then resume ASA  daily -PT eval-->SNF -pain control--norco for d/c Leukocytosis -Likely reactive due to stress demargination -Afebrile and hemodynamically stable -We will not start antibiotics at this time Diabetes mellitus type 2 -NovoLog sliding scale -09/12/2014 hemoglobin A1c 5.8 -Hold outpatient pioglitazone/glimiperide--restart after d/c -hold januvia during hospitalization but restart after d/c Anxiety -Ativan 0.5mg  q 6 hrs prn anxiety Dementia -haldol  q6 hrs prn aggitation during the hospitalization HTN -Continue lisinopril -Hold amlodipine--BP remains acceptable -will not restart amlodipine as pt's BP remained controlled for the hospitalization off of amlodipine lumbar stenosis with left leg radiculopathy/neurogenic claudication Status post left L4-5 foraminotomy per Dr. Jeral Fruit 09/17/2014. Hypokalemia -replete -check mag--1.6 Macrocytic anemia -B12--403 -RBC folate--pending  Discharge Condition: stable  Disposition:  Follow-up Information    Follow up with Karn Cassis, MD In 1 week.   Specialty:  Neurosurgery   Contact information:   [address] [place] [address] [phone]       Follow up with Sheral Apley, MD In 1 week.   Specialty:  Orthopedic Surgery   Contact information:   [address] [place] [address] [phone]       Diet:carb modified Wt Readings from Last 3 Encounters:  09/07/14 101.651 kg (224 lb 1.6 oz)    09/08/14 101.606 kg (224 lb)  08/18/14 104.327 kg (230 lb)    History of present illness:  81 year old woman just discharged 3/28 having undergone L4-5 laminotomies at [facility] for lumbar stenosis with neurogenic claudication who presented to the emergency department after a fall at [facility] nursing home when she was found on the floor. Noted to be confused. Imaging revealed a fracture. The patient was transferred to [facility] for operative intervention. She has had mild memory loss or several months. This became much worse after fall in March and persisted postoperatively suggesting delirium superimposed on dementia. According to chart, she was found on the floor at the nursing home. Review of the patient's last hospitalization was notable for periods of agitation and anxiety, refusal to work with therapy and some outbursts. Similar episodes occurred during this hospitalization as the patient would frequently have outbursts with the nursing and physical therapy staff. Ultimately, the patient's pain was controlled and she remained clinically stable for discharge.  Consultants: Ortho--Dr. Wandra Feinstein  Discharge Exam: Filed Vitals:   09/30/14 0658  BP: 116/41  Pulse: 86  Temp: 98.4 F (36.9 C)  Resp: 16   Filed Vitals:   09/29/14 2149 09/29/14 2345 09/30/14 0400 09/30/14 0658  BP: 119/44   116/41  Pulse: 93   86  Temp: 98.4 F (36.9 C)   98.4 F (36.9 C)  TempSrc: Oral   Oral  Resp: SpO2: 96% 96% 96% 93%   General: Awake and alert, NAD, pleasant, cooperative Cardiovascular: RRR, no rub, no gallop, no S3 Respiratory: CTAB, no wheeze, no rhonchi Abdomen:soft, nontender, nondistended, positive bowel sounds Extremities: 2+LE edema, No lymphangitis, no petechiae  Discharge Instructions      Discharge Instructions  Posterior total hip precautions    Complete by:  As directed      Weight bearing as tolerated    Complete by:  As directed   Laterality:  left   Extremity:  Lower            Medication List    STOP taking these medications        amLODipine 10 MG tablet  Commonly known as:  NORVASC     cefTRIAXone 1 G injection  Commonly known as:  ROCEPHIN     diazepam 5 MG tablet  Commonly known as:  VALIUM     diclofenac 75 MG EC tablet  Commonly known as:  VOLTAREN     levofloxacin 500 MG tablet  Commonly known as:  LEVAQUIN     sodium chloride 0.45 % solution      TAKE these medications        acetaminophen 500 MG tablet  Commonly known as:  TYLENOL  Take 1,000 mg by mouth every 6 (six) hours as needed (pain).     aspirin 325 MG tablet  Take 1 tablet (325 mg total) by mouth daily. X 30 days     aspirin EC 81 MG tablet  Take 1 tablet (81 mg total) by mouth daily. Start after 30 days of aspirin 325mg   Start taking on:  10/29/2014     cholecalciferol 1000 UNITS tablet  Commonly known as:  VITAMIN D  Take 1,000 Units by mouth daily.     colesevelam 625 MG tablet  Commonly known as:  WELCHOL  Take 1,875 mg by mouth 2 (two) times daily with a meal.     ezetimibe 10 MG tablet  Commonly known as:  ZETIA  Take 10 mg by mouth daily at 6 PM.     feeding supplement (PRO-STAT SUGAR FREE 64) Liqd  Take 30 mLs by mouth 2 (two) times daily.     guaiFENesin 600 MG 12 hr tablet  Commonly known as:  MUCINEX  Take 600 mg by mouth 2 (two) times daily. 5 day course started 09/23/14     HYDROcodone-acetaminophen 5-325 MG per tablet  Commonly known as:  NORCO  Take 1-2 tablets by mouth every 6 (six) hours as needed for moderate pain.     insulin aspart 100 UNIT/ML injection  Commonly known as:  novoLOG  Inject 1-9 Units into the skin 3 (three) times daily before meals. Per sliding scale: CBG 121-150 1 unit, 151-200 2 units; 201-250 3 units; 251-300 5 units, 301-351 7 units, 351-400 9 units;>401 call MD     ipratropium-albuterol 0.5-2.5 (3) MG/3ML Soln  Commonly known as:  DUONEB  Take 3 mLs by nebulization every 6 (six)  hours. 5 day course started 09/24/14     lisinopril 10 MG tablet  Commonly known as:  PRINIVIL,ZESTRIL  Take 10 mg by mouth daily.     LORazepam 0.5 MG tablet  Commonly known as:  ATIVAN  Take 1 tablet (0.5 mg total) by mouth every 6 (six) hours as needed for anxiety.     omeprazole 40 MG capsule  Commonly known as:  PRILOSEC  Take 40 mg by mouth daily at 6 (six) AM.     ondansetron 4 MG tablet  Commonly known as:  ZOFRAN  Take 1 tablet (4 mg total) by mouth every 8 (eight) hours as needed for nausea or vomiting.     pioglitazone-glimepiride 30-4 MG per tablet  Commonly known as:  DUETACT  Take 1 tablet  by mouth daily at 6 (six) AM.     rosuvastatin 20 MG tablet  Commonly known as:  CRESTOR  Take 20 mg by mouth daily at 6 PM.     senna-docusate 8.6-50 MG per tablet  Commonly known as:  Senokot-S  Take 1 tablet by mouth 2 (two) times daily. Hold for loose stool     sitaGLIPtin 100 MG tablet  Commonly known as:  JANUVIA  Take 100 mg by mouth daily.         The results of significant diagnostics from this hospitalization (including imaging, microbiology, ancillary and laboratory) are listed below for reference.    Significant Diagnostic Studies: Ct Head Wo Contrast  09/27/2014   CLINICAL DATA:  Acute onset of altered mental status and confusion. Dizziness, weakness and headache. Status post fall. Initial encounter.  EXAM: CT HEAD WITHOUT CONTRAST  CT CERVICAL SPINE WITHOUT CONTRAST  TECHNIQUE: Multidetector CT imaging of the head and cervical spine was performed following the standard protocol without intravenous contrast. Multiplanar CT image reconstructions of the cervical spine were also generated.  COMPARISON:  None.  FINDINGS: CT HEAD FINDINGS  There is no evidence of acute infarction, mass lesion, or intra- or extra-axial hemorrhage on CT.  Prominence of the ventricles and sulci reflects moderate cortical volume loss. Cerebellar atrophy is noted. Scattered periventricular  and subcortical white matter change likely reflects small vessel ischemic microangiopathy.  The brainstem and fourth ventricle are within normal limits. The basal ganglia are unremarkable in appearance. The cerebral hemispheres demonstrate grossly normal gray-white differentiation. No mass effect or midline shift is seen.  There is no evidence of fracture; visualized osseous structures are unremarkable in appearance. The orbits are within normal limits. The paranasal sinuses and mastoid air cells are well-aerated. No significant soft tissue abnormalities are seen.  CT CERVICAL SPINE FINDINGS  There is no evidence of fracture or subluxation. Vertebral bodies demonstrate normal height and alignment. Multilevel disc space narrowing is noted along the cervical spine, with scattered anterior and posterior disc osteophyte complexes. Prevertebral soft tissues are within normal limits.  The thyroid gland is unremarkable in appearance. The visualized lung apices are clear. Dense calcification is noted at the carotid bifurcations bilaterally.  IMPRESSION: 1. No evidence of traumatic intracranial injury or fracture. 2. No evidence of fracture or subluxation along the cervical spine. 3. Moderate cortical volume loss and scattered small vessel ischemic microangiopathy. 4. Mild diffuse degenerative change along the cervical spine. 5. Dense calcification at the carotid bifurcations bilaterally. Carotid ultrasound would be helpful for further evaluation, when and as deemed clinically appropriate.   Electronically Signed   By: Roanna Raider M.D.   On: 09/27/2014 06:32   Ct Cervical Spine Wo Contrast  09/27/2014   CLINICAL DATA:  Acute onset of altered mental status and confusion. Dizziness, weakness and headache. Status post fall. Initial encounter.  EXAM: CT HEAD WITHOUT CONTRAST  CT CERVICAL SPINE WITHOUT CONTRAST  TECHNIQUE: Multidetector CT imaging of the head and cervical spine was performed following the standard protocol  without intravenous contrast. Multiplanar CT image reconstructions of the cervical spine were also generated.  COMPARISON:  None.  FINDINGS: CT HEAD FINDINGS  There is no evidence of acute infarction, mass lesion, or intra- or extra-axial hemorrhage on CT.  Prominence of the ventricles and sulci reflects moderate cortical volume loss. Cerebellar atrophy is noted. Scattered periventricular and subcortical white matter change likely reflects small vessel ischemic microangiopathy.  The brainstem and fourth ventricle are within normal limits. The  basal ganglia are unremarkable in appearance. The cerebral hemispheres demonstrate grossly normal gray-white differentiation. No mass effect or midline shift is seen.  There is no evidence of fracture; visualized osseous structures are unremarkable in appearance. The orbits are within normal limits. The paranasal sinuses and mastoid air cells are well-aerated. No significant soft tissue abnormalities are seen.  CT CERVICAL SPINE FINDINGS  There is no evidence of fracture or subluxation. Vertebral bodies demonstrate normal height and alignment. Multilevel disc space narrowing is noted along the cervical spine, with scattered anterior and posterior disc osteophyte complexes. Prevertebral soft tissues are within normal limits.  The thyroid gland is unremarkable in appearance. The visualized lung apices are clear. Dense calcification is noted at the carotid bifurcations bilaterally.  IMPRESSION: 1. No evidence of traumatic intracranial injury or fracture. 2. No evidence of fracture or subluxation along the cervical spine. 3. Moderate cortical volume loss and scattered small vessel ischemic microangiopathy. 4. Mild diffuse degenerative change along the cervical spine. 5. Dense calcification at the carotid bifurcations bilaterally. Carotid ultrasound would be helpful for further evaluation, when and as deemed clinically appropriate.   Electronically Signed   By: Roanna RaiderJeffery  Chang M.D.    On: 09/27/2014 06:32   Ct Lumbar Spine W Contrast  09/15/2014   CLINICAL DATA:  Left leg pain and weakness. Spondylosis without myelopathy.  EXAM: LUMBAR MYELOGRAM  FLUOROSCOPY TIME:  2 minutes 0 seconds. Two thousand four hundred twenty-seven micro gray meter squared  PROCEDURE: After thorough discussion of risks and benefits of the procedure including bleeding, infection, injury to nerves, blood vessels, adjacent structures as well as headache and CSF leak, written and oral informed consent was obtained. Consent was obtained by Dr. Paulina FusiMark Shogry. Time out form was completed.  Patient was positioned prone on the fluoroscopy table. Local anesthesia was provided with 1% lidocaine without epinephrine after prepped and draped in the usual sterile fashion. Puncture was performed at L5-S1 using a 5 inch 22-gauge spinal needle via a midline approach. Using a single pass through the dura, the needle was placed within the thecal sac, with return of clear CSF. Eighteen of all was injected into the thecal sac, with normal opacification of the nerve roots and cauda equina consistent with free flow within the subarachnoid space.  I personally performed the lumbar puncture and administered the intrathecal contrast. I also personally performed acquisition of the myelogram images.  TECHNIQUE: Contiguous axial images were obtained through the Lumbar spine after the intrathecal infusion of infusion. Coronal and sagittal reconstructions were obtained of the axial image sets.  COMPARISON:  MRI 09/08/2014  FINDINGS: LUMBAR MYELOGRAM FINDINGS:  There are anterior extradural defects throughout the lumbar region, most pronounced at L4-5. There is stenosis of the left lateral recess at L4-5 and diminished filling of the left L4 root sleeve. No other focal nerve root compression is identified.  CT LUMBAR MYELOGRAM FINDINGS:  T12-L1: Disc degeneration. Mild bulging. No stenosis. Conus tip at mid L1.  L1-2:  Minimal bulging of the disc.  No  stenosis.  L2-3: Previous posterior decompression. Disc degeneration. Endplate osteophytes. No central canal stenosis. Foraminal stenosis bilaterally, right worse than left.  L3-4: Previous posterior decompression. Chronic disc degeneration. Endplate osteophytes. No central canal stenosis. Foraminal narrowing bilaterally, right more than left.  L4-5: Previous posterior decompression. Disc degeneration with endplate osteophytes more prominent towards the left. No central canal stenosis. No foraminal stenosis on the right. Severe foraminal stenosis on the left because of bony encroachment. Left L4 nerve root compression  seems likely.  L5-S1: Mild bulging of the disc. Bilateral facet degeneration. No central canal stenosis. No S1 nerve compression. Mild foraminal narrowing bilaterally without compression of the exiting L5 nerve roots.  IMPRESSION: Previous posterior decompression at L2-3, L3-4 and L4-5. No central canal stenosis.  Severe left foraminal stenosis at L4-5 because of encroachment by bony material. Left L4 nerve root would likely be compressed.  Less pronounced foraminal narrowing on the left at L2-3 and L3-4 and on the right at L2-3, L3-4 and L4-5.   Electronically Signed   By: Paulina Fusi M.D.   On: 09/15/2014 15:17   Mr Lumbar Spine Wo Contrast  09/08/2014   CLINICAL DATA:  Left leg pain. Lower extremity weakness. Previous lumbar decompression. Multiple falls at home in the last week.  EXAM: MRI LUMBAR SPINE WITHOUT CONTRAST  TECHNIQUE: Multiplanar, multisequence MR imaging of the lumbar spine was performed. No intravenous contrast was administered.  COMPARISON:  Radiography 08/19/2014.  MRI 03/04/2014.  FINDINGS: There is mild curvature convex to the left with the apex at L3.  T11-12:  Small disc bulge without neural compression.  T12-L1: Small disc bulge without neural compression.  L1-2:  Normal interspace.  L2-3: Previous posterior decompression. Endplate osteophytes and bulging of the disc. Mild  narrowing of the lateral recesses without gross neural compression. Mild foraminal stenosis bilaterally.  L3-4: Previous posterior decompression. Endplate osteophytes and mild bulging of the disc. Mild stenosis of both neural foramina without gross neural compression.  L4-5: Previous posterior decompression. Endplate osteophytes and shallow protrusion of disc material. Narrowing of the lateral recesses. Foraminal narrowing left more than right.  L5-S1: Previous posterior decompression. Minimal bulging of the disc. Mild facet degeneration. No significant stenosis.  Compared to the previous study, no change is seen since September 2015.  IMPRESSION: No change since September 2015. Posterior decompression at L2-3, L3-4, L4-5 and L5-S1. No compressive central canal stenosis. Narrowing of the lateral recesses and foramina at L4-5 could be symptomatic, but is unchanged since 11-03-13. Other less pronounced degenerative changes as outlined above.   Electronically Signed   By: Paulina Fusi M.D.   On: 09/08/2014 09:35   Dg Lumbar Spine 1 View  09/17/2014   CLINICAL DATA:  L4-5 foraminotomy  EXAM: DG C-ARM 61-120 MIN; LUMBAR SPINE - 1 VIEW  COMPARISON:  None.  FLUOROSCOPY TIME:  Radiation Exposure Index (as provided by the fluoroscopic device):  If the device does not provide the exposure index:  Fluoroscopy Time:  5 seconds  Number of Acquired Images:  0  FINDINGS: Single cross-table lateral view of the lower lumbar spine demonstrates posterior surgical instruments directed at the L4-5 and L5-S1 levels.  IMPRESSION: Intraoperative localization as above.   Electronically Signed   By: Charlett Nose M.D.   On: 09/17/2014 11:19   Dg Lumbar Spine 1 View  09/17/2014   CLINICAL DATA:  Left L4-5 foraminotomy  EXAM: LUMBAR SPINE - 1 VIEW  COMPARISON:  Myelogram of September 15, 2014 and lumbar spine series of August 19, 2014  FINDINGS: The images are suboptimal and it is not possible to accurately localize the lumbosacral junction.  Numbering was performed using the lower most rib-bearing vertebral body as T12. The metallic trocar appears to lie approximately 1.5 cm posterior to the L3-L4 disc level.  IMPRESSION: Surgical metallic localization device lying approximately 1.5 cm posterior to the L3-L4 disc space.   Electronically Signed   By: Briannia Laba  [place]   On: 09/17/2014 10:04   Pelvis Portable  09/28/2014   CLINICAL DATA:  Right hip fracture. Postop from right hip arthroplasty.  EXAM: PORTABLE PELVIS 1-2 VIEWS  COMPARISON:  09/27/2014  FINDINGS: Left hip prosthesis seen in expected position. No evidence of fracture or dislocation.  IMPRESSION: Expected postoperative appearance of left hip prosthesis. No evidence of fracture or dislocation.   Electronically Signed   By: Myles Rosenthal M.D.   On: 09/28/2014 13:59   Dg Pelvis Portable  09/27/2014   CLINICAL DATA:  Status post fall on hard surface floor. Sharp left hip pain. Initial encounter.  EXAM: PORTABLE PELVIS 1-2 VIEWS  COMPARISON:  Lumbar spine CT performed 09/15/2014  FINDINGS: Degenerative change is noted at the left hip joint, with diffuse sclerosis and remodeling of the left femoral head. An underlying femoral head fracture cannot be excluded, but is not definitely seen.  More mild right hip joint sclerosis is seen. Diffuse degenerative change is noted along the lumbar spine. The sacroiliac joints are unremarkable in appearance.  The visualized bowel gas pattern is grossly unremarkable in appearance. Scattered phleboliths are noted within the pelvis.  IMPRESSION: 1. Degenerative change at the left hip joint, with diffuse sclerosis and bony remodeling of the left femoral head. Underlying femoral head fracture cannot be excluded, but is not definitely characterized on this single image. This could be better characterized on dedicated left hip radiographs, as deemed clinically appropriate. 2. Minimal right hip degenerative change noted. Diffuse degenerative change along the lumbar spine.    Electronically Signed   By: Roanna Raider M.D.   On: 09/27/2014 06:23   Ir Fluoro Guide Ndl Plmt / Bx  09/10/2014   CLINICAL DATA:  Lumbosacral spondylosis without myelopathy  FLUOROSCOPY TIME:  36 seconds.  PROCEDURE: CAUDAL EPIDURAL INJECTION  Utilizing a caudal approach, the skin overlying the sacral hiatus was cleansed and anesthetized. A 20 gauge Crawford epidural needle was advanced into the sacral epidural space. Injection of Omnipaque 180 shows a good epidural pattern with spread up to L5-S1. No vascular opacification is seen.  120 mg of Depo-Medrol mixed with 5 cc of normal saline and 3 cc of 1% Lidocaine were instilled. The procedure was well-tolerated, and the patient was discharged thirty minutes following the injection in good condition.  IMPRESSION: Technically successful caudal epidural injection.   Electronically Signed   By: Jolaine Click M.D.   On: 09/10/2014 15:37   Dg Chest Portable 1 View  09/27/2014   CLINICAL DATA:  Larey Seat onto hard surface floor. Concern for chest injury. Initial encounter.  EXAM: PORTABLE CHEST - 1 VIEW  COMPARISON:  Chest radiograph performed 09/19/2014  FINDINGS: The lungs are well-aerated. Minimal right-sided atelectasis is noted. There is no evidence of pleural effusion or pneumothorax.  The cardiomediastinal silhouette is mildly enlarged. No acute osseous abnormalities are seen. Degenerative change is noted at the acromioclavicular joints bilaterally.  IMPRESSION: Minimal right-sided atelectasis noted. Mild cardiomegaly. No displaced rib fracture seen.   Electronically Signed   By: Roanna Raider M.D.   On: 09/27/2014 06:20   Dg Chest Port 1 View  09/19/2014   CLINICAL DATA:  Leukocytosis.  EXAM: PORTABLE CHEST - 1 VIEW  COMPARISON:  07/05/2013.  FINDINGS: Mediastinum and hilar structures normal. Mild bibasilar subsegmental atelectasis and/or scarring again noted. Minimal infiltrate in the left base cannot be excluded. No pleural effusion or pneumothorax. Heart  size normal. No acute bony abnormality .  IMPRESSION: Mild bibasilar subsegmental atelectasis and/or scarring again noted. Minimal infiltrate in the left lung base cannot be excluded.  Electronically Signed   By: Maisie Fus  Register   On: 09/19/2014 09:34   Dg C-arm 1-60 Min  09/17/2014   CLINICAL DATA:  L4-5 foraminotomy  EXAM: DG C-ARM 61-120 MIN; LUMBAR SPINE - 1 VIEW  COMPARISON:  None.  FLUOROSCOPY TIME:  Radiation Exposure Index (as provided by the fluoroscopic device):  If the device does not provide the exposure index:  Fluoroscopy Time:  5 seconds  Number of Acquired Images:  0  FINDINGS: Single cross-table lateral view of the lower lumbar spine demonstrates posterior surgical instruments directed at the L4-5 and L5-S1 levels.  IMPRESSION: Intraoperative localization as above.   Electronically Signed   By: Charlett Nose M.D.   On: 09/17/2014 11:19   Dg Myelography Lumbar Inj Lumbosacral  09/15/2014   CLINICAL DATA:  Left leg pain and weakness. Spondylosis without myelopathy.  EXAM: LUMBAR MYELOGRAM  FLUOROSCOPY TIME:  2 minutes 0 seconds. Two thousand four hundred twenty-seven micro gray meter squared  PROCEDURE: After thorough discussion of risks and benefits of the procedure including bleeding, infection, injury to nerves, blood vessels, adjacent structures as well as headache and CSF leak, written and oral informed consent was obtained. Consent was obtained by Dr. Paulina Fusi. Time out form was completed.  Patient was positioned prone on the fluoroscopy table. Local anesthesia was provided with 1% lidocaine without epinephrine after prepped and draped in the usual sterile fashion. Puncture was performed at L5-S1 using a 5 inch 22-gauge spinal needle via a midline approach. Using a single pass through the dura, the needle was placed within the thecal sac, with return of clear CSF. Eighteen of all was injected into the thecal sac, with normal opacification of the nerve roots and cauda equina consistent  with free flow within the subarachnoid space.  I personally performed the lumbar puncture and administered the intrathecal contrast. I also personally performed acquisition of the myelogram images.  TECHNIQUE: Contiguous axial images were obtained through the Lumbar spine after the intrathecal infusion of infusion. Coronal and sagittal reconstructions were obtained of the axial image sets.  COMPARISON:  MRI 09/08/2014  FINDINGS: LUMBAR MYELOGRAM FINDINGS:  There are anterior extradural defects throughout the lumbar region, most pronounced at L4-5. There is stenosis of the left lateral recess at L4-5 and diminished filling of the left L4 root sleeve. No other focal nerve root compression is identified.  CT LUMBAR MYELOGRAM FINDINGS:  T12-L1: Disc degeneration. Mild bulging. No stenosis. Conus tip at mid L1.  L1-2:  Minimal bulging of the disc.  No stenosis.  L2-3: Previous posterior decompression. Disc degeneration. Endplate osteophytes. No central canal stenosis. Foraminal stenosis bilaterally, right worse than left.  L3-4: Previous posterior decompression. Chronic disc degeneration. Endplate osteophytes. No central canal stenosis. Foraminal narrowing bilaterally, right more than left.  L4-5: Previous posterior decompression. Disc degeneration with endplate osteophytes more prominent towards the left. No central canal stenosis. No foraminal stenosis on the right. Severe foraminal stenosis on the left because of bony encroachment. Left L4 nerve root compression seems likely.  L5-S1: Mild bulging of the disc. Bilateral facet degeneration. No central canal stenosis. No S1 nerve compression. Mild foraminal narrowing bilaterally without compression of the exiting L5 nerve roots.  IMPRESSION: Previous posterior decompression at L2-3, L3-4 and L4-5. No central canal stenosis.  Severe left foraminal stenosis at L4-5 because of encroachment by bony material. Left L4 nerve root would likely be compressed.  Less pronounced  foraminal narrowing on the left at L2-3 and L3-4 and on the right at  L2-3, L3-4 and L4-5.   Electronically Signed   By: Paulina Fusi M.D.   On: 09/15/2014 15:17   Dg Hip Unilat With Pelvis 2-3 Views Left  09/27/2014   CLINICAL DATA:  Left hip pain following fall, initial encounter  EXAM: LEFT HIP (WITH PELVIS) 2-3 VIEWS  COMPARISON:  08/19/2014, 09/27/2014  FINDINGS: There are changes consistent with a subcapital femoral neck fracture with impaction and angulation at the fracture site. The pelvic ring is otherwise intact. Degenerative changes of left hip joint are noted. Degenerative changes of lumbar spine are seen as well.  IMPRESSION: Subcapital femoral neck fracture on the left.  Complete   Electronically Signed   By: Alcide Clever M.D.   On: 09/27/2014 07:28   Dg Hip Unilat With Pelvis 2-3 Views Right  09/28/2014   CLINICAL DATA:  Larey Seat with new right hip pain  EXAM: RIGHT HIP (WITH PELVIS) 2-3 VIEWS  COMPARISON:  09/27/2014  FINDINGS: The left subcapital hip fracture is again evident. The right hip is intact. The pelvis is intact.  IMPRESSION: Left subcapital hip fracture again evident. No evidence of acute fracture involving the right hip.   Electronically Signed   By: Ellery Plunk M.D.   On: 09/28/2014 01:40     Microbiology: No results found for this or any previous visit (from the past 240 hour(s)).   Labs: Basic Metabolic Panel:  Recent Labs Lab 09/27/14 0532 09/28/14 0513 09/29/14 0533 09/30/14 0705  NA 138 138 139 138  K 3.4* 3.3* 3.7 3.6  CL 110 110 109 107  CO2 17* 19 17* 25  GLUCOSE 111* 119* 153* 131*  BUN 28* CREATININE 1.05 0.69 0.75 0.67  CALCIUM 8.6 8.3* 8.3* 8.1*  MG  --   --  1.6  --    Liver Function Tests:  Recent Labs Lab 09/27/14 0532  AST 17  ALT 17  ALKPHOS 79  BILITOT 0.6  PROT 6.2  ALBUMIN 2.7*   No results for input(s): LIPASE, AMYLASE in the last 168 hours. No results for input(s): AMMONIA in the last 168 hours. CBC:  Recent  Labs Lab 09/27/14 0532 09/28/14 0513 09/29/14 0533  WBC 15.9* 11.4* 12.0*  NEUTROABS 13.0*  --   --   HGB 10.1* 8.6* 7.9*  HCT 31.0* 27.1* 24.7*  MCV 100.3* 101.1* 101.6*  PLT 463* 437* 399   Cardiac Enzymes: No results for input(s): CKTOTAL, CKMB, CKMBINDEX, TROPONINI in the last 168 hours. BNP: Invalid input(s): POCBNP CBG:  Recent Labs Lab 09/29/14 1113 09/29/14 1627 09/29/14 2137 09/30/14 0611 09/30/14 1123  GLUCAP 231* 183* 149* 138* 190*    Time coordinating discharge:  Greater than 30 minutes  Signed:  Lorianne Malbrough, DO [facility] Pager: [phone] 09/30/2014, 1:40 PM

## 2014-10-02 ENCOUNTER — Encounter (HOSPITAL_COMMUNITY)
Admission: RE | Admit: 2014-10-02 | Discharge: 2014-10-02 | Disposition: A | Discharge status: Home / Self Care | Payer: No Typology Code available for payment source | Source: Ambulatory Visit | Attending: Internal Medicine | Admitting: Internal Medicine

## 2014-10-02 DIAGNOSIS — D649 Anemia, unspecified: Secondary | ICD-10-CM | POA: Diagnosis not present

## 2014-10-02 LAB — ABO/RH: ABO/RH(D): AB POS

## 2014-10-02 LAB — PREPARE RBC (CROSSMATCH)

## 2014-10-02 LAB — HEMOGLOBIN AND HEMATOCRIT, BLOOD
HCT: 25.5 % — ABNORMAL LOW (ref 36.0–46.0)
Hemoglobin: 8.2 g/dL — ABNORMAL LOW (ref 12.0–15.0)

## 2014-10-02 MED ORDER — SODIUM CHLORIDE 0.9 % IV SOLN: Freq: Once | INTRAVENOUS | Status: DC

## 2014-10-02 NOTE — Progress Notes (Signed)
Results for Tina [name], Tina [name] (MRN [id number]) as of 10/02/2014 13:54  Ref. Range 10/02/2014 12:30  Hemoglobin Latest Range: 12.0-15.0 g/dL 8.2 (L)  HCT Latest Range: 36.0-46.0 % 25.5 (L)

## 2014-10-03 ENCOUNTER — Encounter (HOSPITAL_COMMUNITY): Admission: RE | Admit: 2014-10-03 | Payer: No Typology Code available for payment source | Source: Ambulatory Visit

## 2014-10-03 ENCOUNTER — Encounter (HOSPITAL_COMMUNITY)
Admission: RE | Admit: 2014-10-03 | Discharge: 2014-10-03 | Disposition: A | Discharge status: Home / Self Care | Payer: No Typology Code available for payment source | Source: Ambulatory Visit | Attending: Internal Medicine | Admitting: Internal Medicine

## 2014-10-03 DIAGNOSIS — D649 Anemia, unspecified: Secondary | ICD-10-CM | POA: Diagnosis not present

## 2014-10-03 MED ORDER — SODIUM CHLORIDE 0.9 % IV SOLN
Freq: Once | INTRAVENOUS | Status: AC
  Administered 2014-10-03: 500 mL via INTRAVENOUS

## 2014-10-04 LAB — TYPE AND SCREEN
ABO/RH(D): AB POS
ANTIBODY SCREEN: NEGATIVE
UNIT DIVISION: 0
Unit division: 0

## 2014-10-16 ENCOUNTER — Other Ambulatory Visit (HOSPITAL_COMMUNITY): Payer: Self-pay | Admitting: Internal Medicine

## 2014-10-16 ENCOUNTER — Ambulatory Visit (HOSPITAL_COMMUNITY)
Admission: RE | Admit: 2014-10-16 | Discharge: 2014-10-16 | Disposition: A | Discharge status: Home / Self Care | Payer: Medicare Other | Source: Ambulatory Visit | Attending: Internal Medicine | Admitting: Internal Medicine

## 2014-10-16 DIAGNOSIS — R451 Restlessness and agitation: Secondary | ICD-10-CM | POA: Insufficient documentation

## 2014-10-16 DIAGNOSIS — R410 Disorientation, unspecified: Secondary | ICD-10-CM

## 2014-10-17 ENCOUNTER — Ambulatory Visit: Payer: Medicare Other | Admitting: Family

## 2014-10-17 ENCOUNTER — Encounter (HOSPITAL_COMMUNITY): Payer: Medicare Other

## 2014-10-17 ENCOUNTER — Other Ambulatory Visit (HOSPITAL_COMMUNITY): Payer: Medicare Other

## 2014-10-29 ENCOUNTER — Ambulatory Visit (HOSPITAL_COMMUNITY)
Admission: RE | Admit: 2014-10-29 | Discharge: 2014-10-29 | Disposition: A | Discharge status: Home / Self Care | Payer: Medicare Other | Source: Ambulatory Visit | Attending: Internal Medicine | Admitting: Internal Medicine

## 2014-10-29 DIAGNOSIS — L089 Local infection of the skin and subcutaneous tissue, unspecified: Secondary | ICD-10-CM | POA: Insufficient documentation

## 2014-10-29 NOTE — Progress Notes (Signed)
Peripherally Inserted Central Catheter/Midline Placement  The IV Nurse has discussed with the patient and/or persons authorized to consent for the patient, the purpose of this procedure and the potential benefits and risks involved with this procedure.  The benefits include less needle sticks, lab draws from the catheter and patient may be discharged home with the catheter.  Risks include, but not limited to, infection, bleeding, blood clot (thrombus formation), and puncture of an artery; nerve damage and irregular heat beat.  Alternatives to this procedure were also discussed.  PICC/Midline Placement Documentation  PICC / Midline Single Lumen 10/29/14 PICC Left Basilic 43 cm 0 cm (Active)  Indication for Insertion or Continuance of Line Prolonged intravenous therapies 10/29/2014 11:00 AM  Exposed Catheter (cm) 0 cm 10/29/2014 11:00 AM  Site Assessment Clean;Dry;Intact 10/29/2014 11:00 AM  Line Status Flushed;Saline locked;Blood return noted 10/29/2014 11:00 AM  Dressing Type Transparent;Securing device 10/29/2014 11:00 AM  Dressing Status Clean;Dry;Intact;Antimicrobial disc in place 10/29/2014 11:00 AM  Line Care Connections checked and tightened 10/29/2014 11:00 AM  Dressing Intervention New dressing 10/29/2014 11:00 AM  Dressing Change Due 11/05/14 10/29/2014 11:00 AM   PICC inserted by Lindaann SloughKaren Gibson, RN  Chest X-Ray confirmed placement in upper SVC. Pt will be receiving Vancomycin and Zosyn for IV antibiotics through PICC line. Arlina RobesLinda Alford[facility] to discuss if PICC placement was satisfactory for Vancomycin infusion. Bonita QuinLinda said based off CXR PICC could have been 3-4 cm longer to be in distal SVC but it was ok for pt to have Vancomycin infused at current placement, no need for exchange.

## 2014-10-29 NOTE — Discharge Instructions (Signed)
Peripherally Inserted Central Catheter/Midline Placement  The IV Nurse has discussed with the patient and/or persons authorized to consent for the patient, the purpose of this procedure and the potential benefits and risks involved with this procedure.  The benefits include less needle sticks, lab draws from the catheter and patient may be discharged home with the catheter.  Risks include, but not limited to, infection, bleeding, blood clot (thrombus formation), and puncture of an artery; nerve damage and irregular heat beat.  Alternatives to this procedure were also discussed.  PICC/Midline Placement Documentation  PICC / Midline Single Lumen 10/29/14 PICC Left Basilic 43 cm 0 cm (Active)  Indication for Insertion or Continuance of Line Prolonged intravenous therapies 10/29/2014 11:00 AM  Exposed Catheter (cm) 0 cm 10/29/2014 11:00 AM  Site Assessment Clean;Dry;Intact 10/29/2014 11:00 AM  Line Status Flushed;Saline locked;Blood return noted 10/29/2014 11:00 AM  Dressing Type Transparent;Securing device 10/29/2014 11:00 AM  Dressing Status Clean;Dry;Intact;Antimicrobial disc in place 10/29/2014 11:00 AM  Line Care Connections checked and tightened 10/29/2014 11:00 AM  Dressing Intervention New dressing 10/29/2014 11:00 AM  Dressing Change Due 11/05/14 10/29/2014 11:00 AM     PICC Insertion, Care After Refer to this sheet in the next few weeks. These instructions provide you with information on caring for yourself after your procedure. Your health care provider may also give you more specific instructions. Your treatment has been planned according to current medical practices, but problems sometimes occur. Call your health care provider if you have any problems or questions after your procedure. WHAT TO EXPECT AFTER THE PROCEDURE After your procedure, it is typical to have the following:  Mild discomfort at the insertion site. This should not last more than a day. HOME CARE INSTRUCTIONS  Rest at home for the  remainder of the day after the procedure.  You may bend your arm and move it freely. If your PICC is near or at the bend of your elbow, avoid activity with repeated motion at the elbow.  Avoid lifting heavy objects as instructed by your health care provider.  Avoid using a crutch with the arm on the same side as your PICC. You may need to use a walker. Bandage Care  Keep your PICC bandage (dressing) clean and dry to prevent infection.  Ask your health care provider when you may shower. To keep the dressing dry, cover the PICC with plastic wrap and tape before showering. If the dressing does become wet, replace it right after the shower.  Do not soak in the bath, swim, or use hot tubs when you have a PICC.  Change the PICC dressing as instructed by your health care provider.  Change your PICC dressing if it becomes loose or wet. General PICC Care  Check the PICC insertion site daily for leakage, redness, swelling, or pain.  Flush the PICC as directed by your health care provider. Let your health care provider know right away if the PICC is difficult to flush or does not flush. Do not use force to flush the PICC.  Do not use a syringe that is less than 10 mL to flush the PICC.  Never pull or tug on the PICC.  Avoid blood pressure checks on the arm with the PICC.  Keep your PICC identification card with you at all times.  Do not take the PICC out yourself. Only a trained health care professional should remove the PICC. SEEK MEDICAL CARE IF:  You have pain in your arm, ear, face, or teeth.  You have fever or chills.  You have drainage from the PICC insertion site.  You have redness or palpate a "cord" around the PICC insertion site.  You cannot flush the catheter. SEEK IMMEDIATE MEDICAL CARE IF:  You have swelling in the arm in which the PICC is inserted. Document Released: 04/03/2013 Document Revised: 06/18/2013 Document Reviewed: 04/03/2013 [facility] Patient Information  2015 [employer] [place]. This information is not intended to replace advice given to you by your health care provider. Make sure you discuss any questions you have with your health care provider.

## 2014-11-25 ENCOUNTER — Observation Stay (HOSPITAL_COMMUNITY): Admission: AD | Admit: 2014-11-25 | Payer: Self-pay | Source: Ambulatory Visit | Admitting: Internal Medicine

## 2014-11-26 ENCOUNTER — Inpatient Hospital Stay (HOSPITAL_COMMUNITY)
Admission: EM | Admit: 2014-11-26 | Discharge: 2014-12-10 | DRG: 682 | Disposition: A | Discharge status: Skilled Nursing Facility | Payer: Medicare Other | Attending: Internal Medicine | Admitting: Internal Medicine

## 2014-11-26 ENCOUNTER — Emergency Department (HOSPITAL_COMMUNITY): Payer: Medicare Other

## 2014-11-26 ENCOUNTER — Inpatient Hospital Stay (HOSPITAL_COMMUNITY): Payer: Medicare Other

## 2014-11-26 ENCOUNTER — Encounter (HOSPITAL_COMMUNITY): Payer: Self-pay | Admitting: *Deleted

## 2014-11-26 DIAGNOSIS — Z87891 Personal history of nicotine dependence: Secondary | ICD-10-CM | POA: Diagnosis not present

## 2014-11-26 DIAGNOSIS — E876 Hypokalemia: Secondary | ICD-10-CM | POA: Diagnosis not present

## 2014-11-26 DIAGNOSIS — G9340 Encephalopathy, unspecified: Secondary | ICD-10-CM | POA: Diagnosis not present

## 2014-11-26 DIAGNOSIS — K219 Gastro-esophageal reflux disease without esophagitis: Secondary | ICD-10-CM | POA: Diagnosis present

## 2014-11-26 DIAGNOSIS — E785 Hyperlipidemia, unspecified: Secondary | ICD-10-CM | POA: Diagnosis present

## 2014-11-26 DIAGNOSIS — G9341 Metabolic encephalopathy: Secondary | ICD-10-CM | POA: Diagnosis present

## 2014-11-26 DIAGNOSIS — I5043 Acute on chronic combined systolic (congestive) and diastolic (congestive) heart failure: Secondary | ICD-10-CM | POA: Diagnosis not present

## 2014-11-26 DIAGNOSIS — I10 Essential (primary) hypertension: Secondary | ICD-10-CM | POA: Diagnosis present

## 2014-11-26 DIAGNOSIS — E1122 Type 2 diabetes mellitus with diabetic chronic kidney disease: Secondary | ICD-10-CM | POA: Diagnosis not present

## 2014-11-26 DIAGNOSIS — R0600 Dyspnea, unspecified: Secondary | ICD-10-CM | POA: Diagnosis not present

## 2014-11-26 DIAGNOSIS — E11649 Type 2 diabetes mellitus with hypoglycemia without coma: Secondary | ICD-10-CM | POA: Diagnosis present

## 2014-11-26 DIAGNOSIS — R630 Anorexia: Secondary | ICD-10-CM

## 2014-11-26 DIAGNOSIS — Z7982 Long term (current) use of aspirin: Secondary | ICD-10-CM

## 2014-11-26 DIAGNOSIS — I480 Paroxysmal atrial fibrillation: Secondary | ICD-10-CM | POA: Diagnosis not present

## 2014-11-26 DIAGNOSIS — R059 Cough, unspecified: Secondary | ICD-10-CM

## 2014-11-26 DIAGNOSIS — E1165 Type 2 diabetes mellitus with hyperglycemia: Secondary | ICD-10-CM | POA: Diagnosis present

## 2014-11-26 DIAGNOSIS — N390 Urinary tract infection, site not specified: Secondary | ICD-10-CM | POA: Diagnosis present

## 2014-11-26 DIAGNOSIS — I739 Peripheral vascular disease, unspecified: Secondary | ICD-10-CM | POA: Diagnosis present

## 2014-11-26 DIAGNOSIS — N184 Chronic kidney disease, stage 4 (severe): Secondary | ICD-10-CM | POA: Diagnosis present

## 2014-11-26 DIAGNOSIS — I5030 Unspecified diastolic (congestive) heart failure: Secondary | ICD-10-CM

## 2014-11-26 DIAGNOSIS — F0390 Unspecified dementia without behavioral disturbance: Secondary | ICD-10-CM | POA: Diagnosis present

## 2014-11-26 DIAGNOSIS — Z6837 Body mass index (BMI) 37.0-37.9, adult: Secondary | ICD-10-CM | POA: Diagnosis not present

## 2014-11-26 DIAGNOSIS — R627 Adult failure to thrive: Secondary | ICD-10-CM | POA: Diagnosis present

## 2014-11-26 DIAGNOSIS — I509 Heart failure, unspecified: Secondary | ICD-10-CM

## 2014-11-26 DIAGNOSIS — J690 Pneumonitis due to inhalation of food and vomit: Secondary | ICD-10-CM | POA: Diagnosis present

## 2014-11-26 DIAGNOSIS — E43 Unspecified severe protein-calorie malnutrition: Secondary | ICD-10-CM | POA: Diagnosis present

## 2014-11-26 DIAGNOSIS — E1129 Type 2 diabetes mellitus with other diabetic kidney complication: Secondary | ICD-10-CM | POA: Diagnosis not present

## 2014-11-26 DIAGNOSIS — I129 Hypertensive chronic kidney disease with stage 1 through stage 4 chronic kidney disease, or unspecified chronic kidney disease: Secondary | ICD-10-CM | POA: Diagnosis present

## 2014-11-26 DIAGNOSIS — E46 Unspecified protein-calorie malnutrition: Secondary | ICD-10-CM

## 2014-11-26 DIAGNOSIS — E119 Type 2 diabetes mellitus without complications: Secondary | ICD-10-CM

## 2014-11-26 DIAGNOSIS — I5021 Acute systolic (congestive) heart failure: Secondary | ICD-10-CM

## 2014-11-26 DIAGNOSIS — D72829 Elevated white blood cell count, unspecified: Secondary | ICD-10-CM | POA: Diagnosis present

## 2014-11-26 DIAGNOSIS — N170 Acute kidney failure with tubular necrosis: Secondary | ICD-10-CM | POA: Diagnosis present

## 2014-11-26 DIAGNOSIS — E162 Hypoglycemia, unspecified: Secondary | ICD-10-CM

## 2014-11-26 DIAGNOSIS — D631 Anemia in chronic kidney disease: Secondary | ICD-10-CM | POA: Diagnosis present

## 2014-11-26 DIAGNOSIS — E872 Acidosis, unspecified: Secondary | ICD-10-CM

## 2014-11-26 DIAGNOSIS — L8991 Pressure ulcer of unspecified site, stage 1: Secondary | ICD-10-CM | POA: Diagnosis present

## 2014-11-26 DIAGNOSIS — N179 Acute kidney failure, unspecified: Secondary | ICD-10-CM | POA: Diagnosis not present

## 2014-11-26 DIAGNOSIS — Z794 Long term (current) use of insulin: Secondary | ICD-10-CM | POA: Diagnosis not present

## 2014-11-26 DIAGNOSIS — R410 Disorientation, unspecified: Secondary | ICD-10-CM

## 2014-11-26 DIAGNOSIS — R198 Other specified symptoms and signs involving the digestive system and abdomen: Secondary | ICD-10-CM

## 2014-11-26 DIAGNOSIS — L8990 Pressure ulcer of unspecified site, unspecified stage: Secondary | ICD-10-CM | POA: Insufficient documentation

## 2014-11-26 DIAGNOSIS — R509 Fever, unspecified: Secondary | ICD-10-CM

## 2014-11-26 DIAGNOSIS — T501X5A Adverse effect of loop [high-ceiling] diuretics, initial encounter: Secondary | ICD-10-CM | POA: Diagnosis not present

## 2014-11-26 DIAGNOSIS — R05 Cough: Secondary | ICD-10-CM

## 2014-11-26 DIAGNOSIS — Z4659 Encounter for fitting and adjustment of other gastrointestinal appliance and device: Secondary | ICD-10-CM

## 2014-11-26 HISTORY — DX: Heart failure, unspecified: I50.9

## 2014-11-26 LAB — CBC WITH DIFFERENTIAL/PLATELET
BASOS ABS: 0.2 10*3/uL — AB (ref 0.0–0.1)
Basophils Relative: 1 % (ref 0–1)
Eosinophils Absolute: 0.3 10*3/uL (ref 0.0–0.7)
Eosinophils Relative: 2 % (ref 0–5)
HCT: 32.4 % — ABNORMAL LOW (ref 36.0–46.0)
HEMOGLOBIN: 10.5 g/dL — AB (ref 12.0–15.0)
LYMPHS ABS: 1.4 10*3/uL (ref 0.7–4.0)
LYMPHS PCT: 8 % — AB (ref 12–46)
MCH: 29.8 pg (ref 26.0–34.0)
MCHC: 32.4 g/dL (ref 30.0–36.0)
MCV: 92 fL (ref 78.0–100.0)
MONO ABS: 1.4 10*3/uL — AB (ref 0.1–1.0)
MONOS PCT: 8 % (ref 3–12)
Neutro Abs: 14.2 10*3/uL — ABNORMAL HIGH (ref 1.7–7.7)
Neutrophils Relative %: 81 % — ABNORMAL HIGH (ref 43–77)
Platelets: 483 10*3/uL — ABNORMAL HIGH (ref 150–400)
RBC: 3.52 MIL/uL — AB (ref 3.87–5.11)
RDW: 18.4 % — ABNORMAL HIGH (ref 11.5–15.5)
WBC: 17.4 10*3/uL — AB (ref 4.0–10.5)

## 2014-11-26 LAB — GLUCOSE, CAPILLARY
GLUCOSE-CAPILLARY: 154 mg/dL — AB (ref 65–99)
Glucose-Capillary: 149 mg/dL — ABNORMAL HIGH (ref 65–99)
Glucose-Capillary: 165 mg/dL — ABNORMAL HIGH (ref 65–99)
Glucose-Capillary: 165 mg/dL — ABNORMAL HIGH (ref 65–99)

## 2014-11-26 LAB — COMPREHENSIVE METABOLIC PANEL
ALK PHOS: 107 U/L (ref 38–126)
ALT: 11 U/L — AB (ref 14–54)
AST: 13 U/L — ABNORMAL LOW (ref 15–41)
Albumin: 2.1 g/dL — ABNORMAL LOW (ref 3.5–5.0)
Anion gap: 10 (ref 5–15)
BILIRUBIN TOTAL: 0.6 mg/dL (ref 0.3–1.2)
BUN: 28 mg/dL — AB (ref 6–20)
CALCIUM: 8.5 mg/dL — AB (ref 8.9–10.3)
CO2: 16 mmol/L — ABNORMAL LOW (ref 22–32)
Chloride: 117 mmol/L — ABNORMAL HIGH (ref 101–111)
Creatinine, Ser: 2.14 mg/dL — ABNORMAL HIGH (ref 0.44–1.00)
GFR, EST AFRICAN AMERICAN: 24 mL/min — AB (ref >60)
GFR, EST NON AFRICAN AMERICAN: 20 mL/min — AB (ref >60)
Glucose, Bld: 159 mg/dL — ABNORMAL HIGH (ref 65–99)
Potassium: 3.3 mmol/L — ABNORMAL LOW (ref 3.5–5.1)
Sodium: 143 mmol/L (ref 135–145)
TOTAL PROTEIN: 5.4 g/dL — AB (ref 6.5–8.1)

## 2014-11-26 LAB — URINALYSIS, ROUTINE W REFLEX MICROSCOPIC
Bilirubin Urine: NEGATIVE
Glucose, UA: NEGATIVE mg/dL
Hgb urine dipstick: NEGATIVE
Ketones, ur: NEGATIVE mg/dL
NITRITE: NEGATIVE
Protein, ur: NEGATIVE mg/dL
Specific Gravity, Urine: 1.025 (ref 1.005–1.030)
UROBILINOGEN UA: 0.2 mg/dL (ref 0.0–1.0)
pH: 5.5 (ref 5.0–8.0)

## 2014-11-26 LAB — CK TOTAL AND CKMB (NOT AT ARMC)
CK, MB: 5.7 ng/mL — ABNORMAL HIGH (ref 0.5–5.0)
Relative Index: INVALID (ref 0.0–2.5)
Total CK: 25 U/L — ABNORMAL LOW (ref 38–234)

## 2014-11-26 LAB — BLOOD GAS, ARTERIAL
Acid-base deficit: 10.9 mmol/L — ABNORMAL HIGH (ref 0.0–2.0)
Bicarbonate: 13.5 mEq/L — ABNORMAL LOW (ref 20.0–24.0)
Drawn by: 234301
O2 Content: 2 L/min
O2 Saturation: 91.7 %
Patient temperature: 37
TCO2: 12.4 mmol/L (ref 0–100)
pCO2 arterial: 24.8 mmHg — ABNORMAL LOW (ref 35.0–45.0)
pH, Arterial: 7.355 (ref 7.350–7.450)
pO2, Arterial: 65.9 mmHg — ABNORMAL LOW (ref 80.0–100.0)

## 2014-11-26 LAB — URINE MICROSCOPIC-ADD ON

## 2014-11-26 LAB — LACTIC ACID, PLASMA: Lactic Acid, Venous: 1.2 mmol/L (ref 0.5–2.0)

## 2014-11-26 LAB — RETICULOCYTES
RBC.: 3.68 MIL/uL — ABNORMAL LOW (ref 3.87–5.11)
Retic Count, Absolute: 88.3 10*3/uL (ref 19.0–186.0)
Retic Ct Pct: 2.4 % (ref 0.4–3.1)

## 2014-11-26 LAB — CREATININE, URINE, RANDOM: Creatinine, Urine: 38.01 mg/dL

## 2014-11-26 LAB — MRSA PCR SCREENING: MRSA by PCR: NEGATIVE

## 2014-11-26 LAB — PHOSPHORUS: PHOSPHORUS: 3.5 mg/dL (ref 2.5–4.6)

## 2014-11-26 LAB — SODIUM, URINE, RANDOM: Sodium, Ur: 93 mmol/L

## 2014-11-26 LAB — MAGNESIUM: MAGNESIUM: 1.6 mg/dL — AB (ref 1.7–2.4)

## 2014-11-26 MED ORDER — POTASSIUM CHLORIDE CRYS ER 20 MEQ PO TBCR
40.0000 meq | EXTENDED_RELEASE_TABLET | Freq: Once | ORAL | Status: AC
  Administered 2014-11-26: 40 meq via ORAL
  Filled 2014-11-26: qty 2

## 2014-11-26 MED ORDER — POTASSIUM CHLORIDE IN NACL 20-0.45 MEQ/L-% IV SOLN: INTRAVENOUS | Status: DC

## 2014-11-26 MED ORDER — FUROSEMIDE 10 MG/ML IJ SOLN
40.0000 mg | Freq: Once | INTRAMUSCULAR | Status: AC
  Administered 2014-11-26: 40 mg via INTRAVENOUS

## 2014-11-26 MED ORDER — LINAGLIPTIN 5 MG PO TABS
5.0000 mg | ORAL_TABLET | Freq: Every day | ORAL | Status: DC
  Administered 2014-11-26 – 2014-11-28 (×3): 5 mg via ORAL
  Filled 2014-11-26 (×3): qty 1

## 2014-11-26 MED ORDER — ROSUVASTATIN CALCIUM 20 MG PO TABS
20.0000 mg | ORAL_TABLET | Freq: Every day | ORAL | Status: DC
  Administered 2014-11-27 – 2014-12-01 (×5): 20 mg via ORAL
  Filled 2014-11-26 (×7): qty 1

## 2014-11-26 MED ORDER — EZETIMIBE 10 MG PO TABS
10.0000 mg | ORAL_TABLET | Freq: Every day | ORAL | Status: DC
  Administered 2014-11-27 – 2014-12-02 (×6): 10 mg via ORAL
  Filled 2014-11-26 (×8): qty 1

## 2014-11-26 MED ORDER — INSULIN ASPART 100 UNIT/ML ~~LOC~~ SOLN: 1.0000 [IU] | Freq: Three times a day (TID) | SUBCUTANEOUS | Status: DC

## 2014-11-26 MED ORDER — ACETAMINOPHEN 500 MG PO TABS
1000.0000 mg | ORAL_TABLET | Freq: Four times a day (QID) | ORAL | Status: DC | PRN
Start: 2014-11-26 — End: 2014-12-10

## 2014-11-26 MED ORDER — INSULIN ASPART 100 UNIT/ML ~~LOC~~ SOLN: 0.0000 [IU] | Freq: Every day | SUBCUTANEOUS | Status: DC

## 2014-11-26 MED ORDER — GLIMEPIRIDE 4 MG PO TABS
4.0000 mg | ORAL_TABLET | Freq: Every day | ORAL | Status: DC
  Administered 2014-11-27 – 2014-11-28 (×2): 4 mg via ORAL
  Filled 2014-11-26: qty 2
  Filled 2014-11-26 (×2): qty 1

## 2014-11-26 MED ORDER — ONDANSETRON HCL 4 MG/2ML IJ SOLN
4.0000 mg | Freq: Once | INTRAMUSCULAR | Status: AC
  Administered 2014-11-26: 4 mg via INTRAVENOUS
  Filled 2014-11-26: qty 2

## 2014-11-26 MED ORDER — PRO-STAT SUGAR FREE PO LIQD
30.0000 mL | Freq: Two times a day (BID) | ORAL | Status: DC
  Administered 2014-11-27 – 2014-12-02 (×10): 30 mL via ORAL
  Filled 2014-11-26 (×14): qty 30

## 2014-11-26 MED ORDER — DEXTROSE 5 % IV SOLN
1.0000 g | INTRAVENOUS | Status: DC
  Administered 2014-11-27: 1 g via INTRAVENOUS
  Filled 2014-11-26: qty 10

## 2014-11-26 MED ORDER — DEXTROSE 5 % IV SOLN
1.0000 g | Freq: Once | INTRAVENOUS | Status: AC
  Administered 2014-11-26: 1 g via INTRAVENOUS
  Filled 2014-11-26: qty 10

## 2014-11-26 MED ORDER — ALBUTEROL SULFATE (2.5 MG/3ML) 0.083% IN NEBU: 2.5000 mg | INHALATION_SOLUTION | RESPIRATORY_TRACT | Status: DC | PRN

## 2014-11-26 MED ORDER — SODIUM CHLORIDE 0.9 % IV BOLUS (SEPSIS)
1000.0000 mL | Freq: Once | INTRAVENOUS | Status: AC
  Administered 2014-11-26: 1000 mL via INTRAVENOUS

## 2014-11-26 MED ORDER — FUROSEMIDE 40 MG PO TABS: 40.0000 mg | ORAL_TABLET | Freq: Once | ORAL | Status: DC

## 2014-11-26 MED ORDER — PIOGLITAZONE HCL-GLIMEPIRIDE 30-4 MG PO TABS: 1.0000 | ORAL_TABLET | Freq: Every day | ORAL | Status: DC

## 2014-11-26 MED ORDER — POTASSIUM CHLORIDE IN NACL 20-0.45 MEQ/L-% IV SOLN
INTRAVENOUS | Status: DC
  Administered 2014-11-26 – 2014-11-27 (×2): via INTRAVENOUS
  Filled 2014-11-26 (×4): qty 1000

## 2014-11-26 MED ORDER — ASPIRIN EC 81 MG PO TBEC
81.0000 mg | DELAYED_RELEASE_TABLET | Freq: Every day | ORAL | Status: DC
  Administered 2014-11-26 – 2014-11-30 (×5): 81 mg via ORAL
  Filled 2014-11-26 (×6): qty 1

## 2014-11-26 MED ORDER — HEPARIN SODIUM (PORCINE) 5000 UNIT/ML IJ SOLN
5000.0000 [IU] | Freq: Three times a day (TID) | INTRAMUSCULAR | Status: AC
  Administered 2014-11-26 – 2014-12-02 (×16): 5000 [IU] via SUBCUTANEOUS
  Filled 2014-11-26 (×26): qty 1

## 2014-11-26 MED ORDER — FUROSEMIDE 10 MG/ML IJ SOLN
40.0000 mg | Freq: Once | INTRAMUSCULAR | Status: AC
  Administered 2014-11-26: 40 mg via INTRAVENOUS
  Filled 2014-11-26: qty 4

## 2014-11-26 MED ORDER — COLESEVELAM HCL 625 MG PO TABS
1875.0000 mg | ORAL_TABLET | Freq: Two times a day (BID) | ORAL | Status: DC
  Administered 2014-11-27 – 2014-12-03 (×12): 1875 mg via ORAL
  Filled 2014-11-26 (×18): qty 3

## 2014-11-26 MED ORDER — VITAMIN D 1000 UNITS PO TABS
1000.0000 [IU] | ORAL_TABLET | Freq: Every day | ORAL | Status: DC
  Administered 2014-11-26 – 2014-12-10 (×13): 1000 [IU] via ORAL
  Filled 2014-11-26 (×15): qty 1

## 2014-11-26 MED ORDER — ALBUTEROL SULFATE (2.5 MG/3ML) 0.083% IN NEBU: 2.5000 mg | INHALATION_SOLUTION | Freq: Four times a day (QID) | RESPIRATORY_TRACT | Status: DC

## 2014-11-26 MED ORDER — PANTOPRAZOLE SODIUM 40 MG PO TBEC
40.0000 mg | DELAYED_RELEASE_TABLET | Freq: Every day | ORAL | Status: DC
  Administered 2014-11-26 – 2014-11-28 (×3): 40 mg via ORAL
  Filled 2014-11-26 (×3): qty 1

## 2014-11-26 MED ORDER — INSULIN ASPART 100 UNIT/ML ~~LOC~~ SOLN
0.0000 [IU] | Freq: Three times a day (TID) | SUBCUTANEOUS | Status: DC
  Administered 2014-11-26: 2 [IU] via SUBCUTANEOUS
  Administered 2014-11-26: 1 [IU] via SUBCUTANEOUS
  Administered 2014-11-26 – 2014-11-27 (×3): 2 [IU] via SUBCUTANEOUS

## 2014-11-26 MED ORDER — PIOGLITAZONE HCL 15 MG PO TABS
30.0000 mg | ORAL_TABLET | Freq: Every day | ORAL | Status: DC
  Administered 2014-11-27 – 2014-11-28 (×2): 30 mg via ORAL
  Filled 2014-11-26 (×4): qty 2

## 2014-11-26 MED ORDER — SODIUM CHLORIDE 0.9 % IV SOLN
INTRAVENOUS | Status: DC
  Administered 2014-11-26: 04:00:00 via INTRAVENOUS

## 2014-11-26 MED ORDER — DEXTROSE 5 % IV SOLN: 1.0000 g | INTRAVENOUS | Status: DC

## 2014-11-26 MED ORDER — IPRATROPIUM-ALBUTEROL 0.5-2.5 (3) MG/3ML IN SOLN
3.0000 mL | Freq: Four times a day (QID) | RESPIRATORY_TRACT | Status: DC
Start: 2014-11-26 — End: 2014-11-26
  Administered 2014-11-26: 3 mL via RESPIRATORY_TRACT
  Filled 2014-11-26: qty 3

## 2014-11-26 MED ORDER — SENNOSIDES-DOCUSATE SODIUM 8.6-50 MG PO TABS
1.0000 | ORAL_TABLET | Freq: Two times a day (BID) | ORAL | Status: DC
  Administered 2014-11-26 – 2014-12-10 (×19): 1 via ORAL
  Filled 2014-11-26 (×34): qty 1

## 2014-11-26 NOTE — Progress Notes (Addendum)
This is an assumption of care note. She was admitted early this morning by Dr. Conley RollsLe with confusion, elevated white blood cell count and elevated creatinine. She appeared to have a urinary tract infection based on urinalysis. She appeared to be volume depleted and it was thought that she had acute kidney injury from volume depletion and she was given a liter of IV fluids but then developed what appeared to be acute pulmonary edema. She was given Lasix at that point. She is not known to have a cardiac history but does have multiple cardiac risk factors including diabetes hypertension hyperlipidemia. She is confused. At her baseline before laminectomy in March and then subsequent fall and hip fracture she was not confused but I don't know what her baseline is now since she's been in the skilled care facility.  Exam shows that she is obese moaning and confused. Her chest is clearer now than it was earlier this morning by description. Her heart is regular. Her abdomen is soft and obese without masses. Extremities show no edema  She will be treated with IV antibiotics for presumed UTI she will have echocardiogram and potentially cardiology consultation depending on the results of echo. Renal ultrasound has been requested. Nephrology consultation will be requested. Based on her albumin level she probably has some element of malnutrition and based on the carbon monoxide on her basic metabolic profile I think she has a metabolic acidosis. This may be from her acute kidney injury but considering the abnormalities in her urinalysis I'm going to have her get a lactic acid level to be sure not dealing with lactic acidemia possibly related to sepsis

## 2014-11-26 NOTE — Consult Note (Signed)
Tina [name] MRN: [id number] DOB/AGE: 07/06/1930 81 y.o. Primary Care Physician:HAWKINS,EDWARD L, MD Admit date: 11/26/2014 Chief Complaint:  Chief Complaint  Patient presents with  . abnormal lab values    HPI: [name] who was sent form NH with c/o abnormal labs.  HPI dates back to 2 months when pt had laminectomy, thereafter p had  Fall resulting femoral neck fracture requiring ORIF .[name] as she had high Creat and WBC. Upon evaluation in ER pt was found to have AKI and UTI. In the interim pt was given IVf and later had CHF Pt was admitted for further care. Pt remains confused and does not offer any specific complaints    Past Medical History  Diagnosis Date  . Diabetes mellitus   . Hypertension   . Osteopenia   . Peripheral vascular disease, unspecified   . GERD (gastroesophageal reflux disease)   . Arthritis   . Shortness of breath     exersion  . Pneumonia     child  . Anemia     hx  . Hepatitis     "a"  . Fall at home October 09, 2013  . DM (diabetes mellitus) type II uncontrolled, periph vascular disorder 09/17/2014       Family History  Problem Relation Age of Onset  . Cancer Mother   . Hypertension Mother   . Heart disease Father     before age 42  . Heart attack Father   . Hyperlipidemia Son     Social History:  reports that she quit smoking about 53 years ago. Her smoking use included Cigarettes. She has a .3 pack-year smoking history. She has never used smokeless tobacco. She reports that she does not drink alcohol or use illicit drugs.   Allergies: No Known Allergies  Medications Prior to Admission  Medication Sig Dispense Refill  . acetaminophen (TYLENOL) 500 MG tablet Take 1,000 mg by mouth every 6 (six) hours as needed (pain).     . Amino Acids-Protein Hydrolys (FEEDING SUPPLEMENT, PRO-STAT SUGAR FREE 64,) LIQD Take 30 mLs by mouth 2 (two) times daily.    Marland Kitchen aspirin 325 MG  tablet Take 1 tablet (325 mg total) by mouth daily. X 30 days 30 tablet 0  . aspirin EC 81 MG tablet Take 1 tablet (81 mg total) by mouth daily. Start after 30 days of aspirin  30 tablet 0  . cholecalciferol (VITAMIN D) 1000 UNITS tablet Take 1,000 Units by mouth daily.    . colesevelam (WELCHOL) 625 MG tablet Take 1,875 mg by mouth 2 (two) times daily with a meal.     . ezetimibe (ZETIA) 10 MG tablet Take 10 mg by mouth daily at 6 PM.     . guaiFENesin (MUCINEX) 600 MG 12 hr tablet Take 600 mg by mouth 2 (two) times daily. 5 day course started 09/23/14    . HYDROcodone-acetaminophen (NORCO) 5-325 MG per tablet Take 1-2 tablets by mouth every 6 (six) hours as needed for moderate pain. 30 tablet 0  . insulin aspart (NOVOLOG) 100 UNIT/ML injection Inject 1-9 Units into the skin 3 (three) times daily before meals. Per sliding scale: CBG 121-150 1 unit, 151-200 2 units; 201-250 3 units; 251-300 5 units, 301-351 7 units, 351-400 9 units;>401 call MD    . ipratropium-albuterol (DUONEB) 0.5-2.5 (3) MG/3ML SOLN Take 3 mLs by nebulization every 6 (six) hours. 5 day course  started 09/24/14    . lisinopril (PRINIVIL,ZESTRIL) 10 MG tablet Take 10 mg by mouth daily.      Marland Kitchen LORazepam (ATIVAN) 0.5 MG tablet Take 1 tablet (0.5 mg total) by mouth every 6 (six) hours as needed for anxiety. 30 tablet 0  . omeprazole (PRILOSEC) 40 MG capsule Take 40 mg by mouth daily at 6 (six) AM.     . ondansetron (ZOFRAN) 4 MG tablet Take 1 tablet (4 mg total) by mouth every 8 (eight) hours as needed for nausea or vomiting. 20 tablet 0  . pioglitazone-glimepiride (DUETACT) 30-4 MG per tablet Take 1 tablet by mouth daily at 6 (six) AM.     . rosuvastatin (CRESTOR) 20 MG tablet Take 20 mg by mouth daily at 6 PM.     . senna-docusate (SENOKOT-S) 8.6-50 MG per tablet Take 1 tablet by mouth 2 (two) times daily. Hold for loose stool    . sitaGLIPtin (JANUVIA) 100 MG tablet Take 100 mg by mouth daily.           WUJ:WJXBJ from the  symptoms mentioned above,there are no other symptoms referable to all systems reviewed.  Marland Kitchen aspirin EC  81 mg Oral Daily  . [START ON 11/27/2014] cefTRIAXone (ROCEPHIN)  IV  1 g Intravenous Q24H  . cholecalciferol  1,000 Units Oral Daily  . colesevelam  1,875 mg Oral BID WC  . ezetimibe  10 mg Oral q1800  . feeding supplement (PRO-STAT SUGAR FREE 64)  30 mL Oral BID  . pioglitazone  30 mg Oral Q breakfast   And  . glimepiride  4 mg Oral Q breakfast  . heparin  5,000 Units Subcutaneous 3 times per day  . insulin aspart  0-5 Units Subcutaneous QHS  . insulin aspart  0-9 Units Subcutaneous TID WC  . ipratropium-albuterol  3 mL Nebulization Q6H  . linagliptin  5 mg Oral Daily  . pantoprazole  40 mg Oral Daily  . potassium chloride  40 mEq Oral Once  . rosuvastatin  20 mg Oral q1800  . senna-docusate  1 tablet Oral BID     Physical Exam: Vital signs in last 24 hours: Temp:  [97.5 F (36.4 C)-98 F (36.7 C)] 98 F (36.7 C) (06/01 0730) Pulse Rate:  [94-116] 98 (06/01 0807) Resp:  [20-37] 22 (06/01 0807) BP: (103-161)/(74-139) 151/79 mmHg (06/01 0807) SpO2:  [94 %-98 %] 94 % (06/01 0807) Weight:  [223 lb 12.3 oz (101.5 kg)] 223 lb 12.3 oz (101.5 kg) (06/01 0807) Weight change:     Intake/Output from previous day: 05/31 0701 - 06/01 0700 In: -  Out: 29 [Urine:29]     Physical Exam: General- pt is awake,alert, oriented to  place and person not time Resp- No acute REsp distress,minimal Rhonchi CVS- S1S2 regular in rate and rhythm GIT- BS+, soft, NT, ND EXT- NO LE Edema, Cyanosis CNS- CN 2-12 grossly intact. Moving all 4 extremities    Lab Results: CBC  Recent Labs  11/26/14 0228  WBC 17.4*  HGB 10.5*  HCT 32.4*  PLT 483*    BMET  Recent Labs  11/26/14 0228  NA 143  K 3.3*  CL 117*  CO2 16*  GLUCOSE 159*  BUN 28*  CREATININE 2.14*  CALCIUM 8.5*    Anion gap 143-133=10  Alb 2.1  Delta AG 10-6=4  Delta Bicarb 24-16=8  MICRO No results found  for this or any previous visit (from the past 240 hour(s)).    Lab Results  Component Value  Date   CALCIUM 8.5* 11/26/2014   PHOS 3.5 11/26/2014      Impression: 1)Renal  AKI secondary to Prerenal/ATN/ POst renal/ Cardiorenal                AKi sec to CHF/ ACE                Will initiate work up  2)HTN Medication- On Diuretics   3)Anemia HGb stable  4)Id-admitted with UTI          On IV ceftriaxone  5)CHF- CXR showed congestion Primary MD following  6)Electrolytes Hypokalemic    Being replte NOrmonatremic  7)Acid base Co2 NOT at goal  NON AG acidosis + High AG acidosis    Plan:  Will ask for ABG Will ask for renal u/s Will ask for 2 d echo Will ask for CPK Will ask for FENA Will start IVF with KCL and Bicarb( slow ) Will continue lasix Will follow BMet Will initiate anemia work up     HoneywellBHUTANI,Aidon Klemens S 11/26/2014, 9:17 AM

## 2014-11-26 NOTE — ED Provider Notes (Signed)
TIME SEEN: 2:00 AM  CHIEF COMPLAINT: Abnormal labs  HPI: Pt is a 81 y.o. female with history of insulin-dependent diabetes, hypertension, dementia who lives at [facility] [place] who was recently admitted to [facility] the beginning of April for a left femoral neck fracture that was repaired by Dr. Eulah PontMurphy on 4/3. She was sent today from her nursing home for concern for abnormal labs. She was found to have a leukocytosis and decreased bicarbonate level. Her bicarbonate was 9. Of note in patient's chart she has had a chronic leukocytosis since March 23. Some concern that she may need a wound VAC placed to her left hip.  ROS: Level V caveat for dementia  PAST MEDICAL HISTORY/PAST SURGICAL HISTORY:  Past Medical History  Diagnosis Date  . Diabetes mellitus   . Hypertension   . Osteopenia   . Peripheral vascular disease, unspecified   . GERD (gastroesophageal reflux disease)   . Arthritis   . Shortness of breath     exersion  . Pneumonia     child  . Anemia     hx  . Hepatitis     "a"  . Fall at home October 09, 2013  . DM (diabetes mellitus) type II uncontrolled, periph vascular disorder 09/17/2014    MEDICATIONS:  Prior to Admission medications   Medication Sig Start Date End Date Taking? Authorizing Provider  acetaminophen (TYLENOL) 500 MG tablet Take 1,000 mg by mouth every 6 (six) hours as needed (pain).     Historical Provider, MD  Amino Acids-Protein Hydrolys (FEEDING SUPPLEMENT, PRO-STAT SUGAR FREE 64,) LIQD Take 30 mLs by mouth 2 (two) times daily.    Historical Provider, MD  aspirin 325 MG tablet Take 1 tablet (325 mg total) by mouth daily. X 30 days 09/30/14   Catarina Hartshornavid Tat, MD  aspirin EC 81 MG tablet Take 1 tablet (81 mg total) by mouth daily. Start after 30 days of aspirin 325mg  10/29/14   Catarina Hartshornavid Tat, MD  cholecalciferol (VITAMIN D) 1000 UNITS tablet Take 1,000 Units by mouth daily.    Historical Provider, MD  colesevelam (WELCHOL) 625 MG tablet Take 1,875 mg by mouth 2  (two) times daily with a meal.     Historical Provider, MD  ezetimibe (ZETIA) 10 MG tablet Take 10 mg by mouth daily at 6 PM.     Historical Provider, MD  guaiFENesin (MUCINEX) 600 MG 12 hr tablet Take 600 mg by mouth 2 (two) times daily. 5 day course started 09/23/14    Historical Provider, MD  HYDROcodone-acetaminophen (NORCO) 5-325 MG per tablet Take 1-2 tablets by mouth every 6 (six) hours as needed for moderate pain. 09/30/14   Catarina Hartshornavid Tat, MD  insulin aspart (NOVOLOG) 100 UNIT/ML injection Inject 1-9 Units into the skin 3 (three) times daily before meals. Per sliding scale: CBG 121-150 1 unit, 151-200 2 units; 201-250 3 units; 251-300 5 units, 301-351 7 units, 351-400 9 units;>401 call MD    Historical Provider, MD  ipratropium-albuterol (DUONEB) 0.5-2.5 (3) MG/3ML SOLN Take 3 mLs by nebulization every 6 (six) hours. 5 day course started 09/24/14    Historical Provider, MD  lisinopril (PRINIVIL,ZESTRIL) 10 MG tablet Take 10 mg by mouth daily.      Historical Provider, MD  LORazepam (ATIVAN) 0.5 MG tablet Take 1 tablet (0.5 mg total) by mouth every 6 (six) hours as needed for anxiety. 09/30/14   Catarina Hartshornavid Tat, MD  omeprazole (PRILOSEC) 40 MG capsule Take 40 mg by mouth daily at 6 (six) AM.  Historical Provider, MD  ondansetron (ZOFRAN) 4 MG tablet Take 1 tablet (4 mg total) by mouth every 8 (eight) hours as needed for nausea or vomiting. 09/28/14   Brittney Tresa Endo, PA-C  pioglitazone-glimepiride (DUETACT) 30-4 MG per tablet Take 1 tablet by mouth daily at 6 (six) AM.     Historical Provider, MD  rosuvastatin (CRESTOR) 20 MG tablet Take 20 mg by mouth daily at 6 PM.     Historical Provider, MD  senna-docusate (SENOKOT-S) 8.6-50 MG per tablet Take 1 tablet by mouth 2 (two) times daily. Hold for loose stool    Historical Provider, MD  sitaGLIPtin (JANUVIA) 100 MG tablet Take 100 mg by mouth daily.      Historical Provider, MD    ALLERGIES:  No Known Allergies  SOCIAL HISTORY:  History  Substance Use  Topics  . Smoking status: Former Smoker -- 0.30 packs/day for 1 years    Types: Cigarettes    Quit date: 01/06/1961  . Smokeless tobacco: Never Used  . Alcohol Use: No    FAMILY HISTORY: Family History  Problem Relation Age of Onset  . Cancer Mother   . Hypertension Mother   . Heart disease Father     before age 49  . Heart attack Father   . Hyperlipidemia Son     EXAM: BP 148/87 mmHg  Pulse 110  Temp(Src) 97.5 F (36.4 C) (Oral)  Resp 20  SpO2 94% CONSTITUTIONAL: Alert and disoriented, unable to answer questions or follow commands appropriately, obese, elderly, in no distress HEAD: Normocephalic EYES: Conjunctivae clear, PERRL ENT: normal nose; no rhinorrhea; dry mucous membranes; pharynx without lesions noted NECK: Supple, no meningismus, no LAD  CARD: Regular and tachycardiac; S1 and S2 appreciated; no murmurs, no clicks, no rubs, no gallops RESP: Normal chest excursion without splinting or tachypnea; breath sounds clear and equal bilaterally; no wheezes, no rhonchi, no rales, no hypoxia or respiratory distress, speaking full sentences ABD/GI: Normal bowel sounds; non-distended; soft, non-tender, no rebound, no guarding, no peritoneal signs BACK:  The back appears normal and is non-tender to palpation, there is no CVA tenderness, well-healed surgical scar to the lumbar spine without surrounding erythema or warmth or tenderness, no sacral decubitus ulcers EXT: Patient has a healing surgical scar to the left hip with a 2 x 1 cm very superficial ulcer that has no drainage and no surrounding erythema or warmth, no induration, no joint effusion or mass, Normal ROM in all joints; non-tender to palpation; no edema; normal capillary refill; no cyanosis, no calf tenderness or swelling    SKIN: Normal color for age and race; warm NEURO: Moves all extremities equally, unable to follow commands or answer questions, no facial droop, no dysarthria   MEDICAL DECISION MAKING: Patient sent  here from nursing home for concerns for abnormal labs. We'll repeat labs in the emergency department. She does have dry mucous membranes and is tachycardic but otherwise he remained clinically stable and afebrile. We'll give IV fluids. She does not need a wound VAC to her left hip. There is a very superficial noninfected wound to this area. There is no sign of a septic arthritis.  ED PROGRESS: Patient's leukocytosis of 17.4 but she has had chronic leukocytosis since March. She does have a bicarbonate of 16 with no elevation of her anion gap. Creatinine is 2.14. Suspect this is all from dehydration as she appears very dry on exam. We'll continue IV hydration. Urine shows large leukocytes, many bacteria and few squamous cells. We'll give  dose of ceftriaxone in the ED. Culture is pending. Discussed with nursing home staff reports patient has had temperatures of 99.9. She has had a dry cough. No vomiting or diarrhea. Was given Rocephin and Avelox last night because of her leukocytosis. I have updated patient's daughter who will see the patient tomorrow in the hospital. She is aware that she is at [facility].  Pt's daughter's number is [phone].   4:00 AM  D/w Dr. Conley Rolls for admission.  Layla Maw Seabron Iannello, DO 11/26/14 [phone]

## 2014-11-26 NOTE — ED Notes (Signed)
Pt brought in by [facility] for c/o abnormal lab values

## 2014-11-26 NOTE — Clinical Social Work Note (Signed)
Clinical Social Work Assessment  Patient Details  Name: Tina [name] A Bohanon MRN: [id number] Date of Birth: 06/20/1931  Date of referral:  11/26/14               Reason for consult:  Facility Placement                Permission sought to share information with:    Permission granted to share information::     Name::        Agency::     Relationship::     Contact Information:     Housing/Transportation Living arrangements for the past 2 months:  [facility] Facility Source of Information:  Adult Children Patient Interpreter Needed:  None Criminal Activity/Legal Involvement Pertinent to Current Situation/Hospitalization:  No - Comment as needed Significant Relationships:  Adult Children Lives with:  Facility Resident Do you feel safe going back to the place where you live?  Yes Need for family participation in patient care:  Yes (Comment)  Care giving concerns:  [facility]    Social Worker assessment / plan:  Pt not assessed due to reports of confusion. CSW spoke with pt's daughter, [name] who reports pt has been a resident at [facility] for several months. She has finished skilled days and is now using long term care policy. [name] said that pt was and is still hopeful to maybe return home, but she continues to have times where she slides back and is not ambulating. [name] lives nearby and visits several times a week. Per Debbie at facility, pt requires extensive assist, particularly due to confusion. She is okay to return when medically stable.   Employment status:  Retired Health and safety inspectornsurance information:  Medicare PT Recommendations:  Not assessed at this time Information / Referral to community resources:  Skilled Holiday representativeursing Facility (return to [facility])  Patient/Family's Response to care:  Pt's daughter agreeable to return to [facility] at d/c.   Patient/Family's Understanding of and Emotional Response to Diagnosis, Current Treatment, and Prognosis:  Pt's daughter appears to have  understanding of pt's medical issues. She is concerned about pt's confusion because recently it has been more apparent. [name] plans to visit pt this evening.   Emotional Assessment Appearance:  Other (Comment Required (not assessed due to reports of confusion) Attitude/Demeanor/Rapport:  Unable to Assess Affect (typically observed):  Unable to Assess Orientation:    Alcohol / Substance use:  Not Applicable Psych involvement (Current and /or in the community):  No (Comment)  Discharge Needs  Concerns to be addressed:  Discharge Planning Concerns Readmission within the last 30 days:  No Current discharge risk:  Chronically ill Barriers to Discharge:  Continued Medical Work up   [place] [employer] [place] [employer], Tonee Silverstein [place], LCSW 11/26/2014, 3:05 PM [phone]

## 2014-11-26 NOTE — Progress Notes (Signed)
Late Entry - 1730  After placing foley earlier this evening, noticed that patient had become labored with breathing and started having exp wheezes.  Dr. Juanetta GoslingHawkins notified, orders for lasix given.  Made oncoming nurse aware, wheezing has lessened somewhat at this time.

## 2014-11-26 NOTE — H&P (Signed)
[facility] History and Physical  Tina [name] ZOX:[id number] DOB: Aug 10, 1930    PCP:   Fredirick Maudlin, MD   Chief Complaint: sent in from [facility] for "abnormal labs"  ( elevated Cr and leukocytosis).  HPI: Tina [name] is an 81 y.o. female with hx of neurogenic claudication, s/p laminectomy by Dr Jeral Fruit in March 2016, subsequently fell and had left subcapital femoral neck Fx, had ORIF by Dr Eulah Pont, sent to SNF, and sent in the ER as she was found to have leukocytosis.  She also has hx of dementia, DM2, HTN, and Macrocytic anemia.  Her leukocytosis has been there since her last admission, and it was felt at that time to be stress demargination.  Evaluation in the ER showed Cr of 2.1, with K of 3.3, an increase from her prior baseline of 1.6.  She was also found to have a UTI with UA having TNTC WBCs.  The EDP thought she was volume depleted, and her AKI was due to prerenal azotemia, and IVF was given to her.  After only one liter of IVF, she was clinically in CHF, with bilateral rales, and it was a change.  IVF was stopped, and Lasix  IV given, and she was better.  Hospitalist was asked to admit her for leukocytosis, UTI, and AKI on CKD>   Rewiew of Systems: Unable to obtain meaningful ROS.   Past Medical History  Diagnosis Date  . Diabetes mellitus   . Hypertension   . Osteopenia   . Peripheral vascular disease, unspecified   . GERD (gastroesophageal reflux disease)   . Arthritis   . Shortness of breath     exersion  . Pneumonia     child  . Anemia     hx  . Hepatitis     "a"  . Fall at home October 09, 2013  . DM (diabetes mellitus) type II uncontrolled, periph vascular disorder 09/17/2014    Past Surgical History  Procedure Laterality Date  . Appendectomy  1994  . Eye surgery Bilateral 08  . Lumbar laminectomy/decompression microdiscectomy N/A 07/09/2013    Procedure: LUMBAR LAMINECTOMY/DECOMPRESSION MICRODISCECTOMY LUMBAR TWO TO LUMBAR FIVE;  Surgeon: Karn Cassis, MD;  Location: [facility]URO ORS;  Service: Neurosurgery;  Laterality: N/A;  . Lumbar laminectomy/decompression microdiscectomy Left 09/17/2014    Procedure: Left Lumbar four-five Foraminotomy;  Surgeon: Hilda Lias, MD;  Location: [facility] NEURO ORS;  Service: Neurosurgery;  Laterality: Left;  Left L4-5 Foraminotomy  . Hip arthroplasty Left 09/28/2014    Procedure: HEMI ARTHROPLASTY;  Surgeon: Sheral Apley, MD;  Location: [facility] OR;  Service: Orthopedics;  Laterality: Left;    Medications:  HOME MEDS: Prior to Admission medications   Medication Sig Start Date End Date Taking? Authorizing Provider  acetaminophen (TYLENOL) 500 MG tablet Take 1,000 mg by mouth every 6 (six) hours as needed (pain).     Historical Provider, MD  Amino Acids-Protein Hydrolys (FEEDING SUPPLEMENT, PRO-STAT SUGAR FREE 64,) LIQD Take 30 mLs by mouth 2 (two) times daily.    Historical Provider, MD  aspirin 325 MG tablet Take 1 tablet (325 mg total) by mouth daily. X 30 days 09/30/14   Catarina Hartshorn, MD  aspirin EC 81 MG tablet Take 1 tablet (81 mg total) by mouth daily. Start after 30 days of aspirin  10/29/14   Catarina Hartshorn, MD  cholecalciferol (VITAMIN D) 1000 UNITS tablet Take 1,000 Units by mouth daily.    Historical Provider, MD  colesevelam (WELCHOL) 625 MG tablet  Take 1,875 mg by mouth 2 (two) times daily with a meal.     Historical Provider, MD  ezetimibe (ZETIA) 10 MG tablet Take 10 mg by mouth daily at 6 PM.     Historical Provider, MD  guaiFENesin (MUCINEX) 600 MG 12 hr tablet Take 600 mg by mouth 2 (two) times daily. 5 day course started 09/23/14    Historical Provider, MD  HYDROcodone-acetaminophen (NORCO) 5-325 MG per tablet Take 1-2 tablets by mouth every 6 (six) hours as needed for moderate pain. 09/30/14   Catarina Hartshornavid Tat, MD  insulin aspart (NOVOLOG) 100 UNIT/ML injection Inject 1-9 Units into the skin 3 (three) times daily before meals. Per sliding scale: CBG 121-150 1 unit, 151-200 2 units; 201-250 3 units; 251-300 5  units, 301-351 7 units, 351-400 9 units;>401 call MD    Historical Provider, MD  ipratropium-albuterol (DUONEB) 0.5-2.5 (3) MG/3ML SOLN Take 3 mLs by nebulization every 6 (six) hours. 5 day course started 09/24/14    Historical Provider, MD  lisinopril (PRINIVIL,ZESTRIL) 10 MG tablet Take 10 mg by mouth daily.      Historical Provider, MD  LORazepam (ATIVAN) 0.5 MG tablet Take 1 tablet (0.5 mg total) by mouth every 6 (six) hours as needed for anxiety. 09/30/14   Catarina Hartshornavid Tat, MD  omeprazole (PRILOSEC) 40 MG capsule Take 40 mg by mouth daily at 6 (six) AM.     Historical Provider, MD  ondansetron (ZOFRAN) 4 MG tablet Take 1 tablet (4 mg total) by mouth every 8 (eight) hours as needed for nausea or vomiting. 09/28/14   Brittney Tresa EndoKelly, PA-C  pioglitazone-glimepiride (DUETACT) 30-4 MG per tablet Take 1 tablet by mouth daily at 6 (six) AM.     Historical Provider, MD  rosuvastatin (CRESTOR) 20 MG tablet Take 20 mg by mouth daily at 6 PM.     Historical Provider, MD  senna-docusate (SENOKOT-S) 8.6-50 MG per tablet Take 1 tablet by mouth 2 (two) times daily. Hold for loose stool    Historical Provider, MD  sitaGLIPtin (JANUVIA) 100 MG tablet Take 100 mg by mouth daily.      Historical Provider, MD     Allergies:  No Known Allergies  Social History:   reports that she quit smoking about 53 years ago. Her smoking use included Cigarettes. She has a .3 pack-year smoking history. She has never used smokeless tobacco. She reports that she does not drink alcohol or use illicit drugs.  Family History: Family History  Problem Relation Age of Onset  . Cancer Mother   . Hypertension Mother   . Heart disease Father     before age 79  . Heart attack Father   . Hyperlipidemia Son      Physical Exam: Filed Vitals:   11/26/14 0500 11/26/14 0530 11/26/14 0600 11/26/14 0630  BP: 161/139 157/99 150/90 124/74  Pulse:  109    Temp:      TempSrc:      Resp: 37 32 32 30  SpO2:  97%     Blood pressure 124/74,  pulse 109, temperature 97.5 F (36.4 C), temperature source Oral, resp. rate 30, SpO2 97 %.  GEN:  Pleasant patient lying in the stretcher in no acute distress; cooperative with exam. PSYCH:  She is confused/  Does appear anxious  HEENT: Mucous membranes pink and anicteric; PERRLA; EOM intact; no cervical lymphadenopathy nor thyromegaly or carotid bruit; no JVD; There were no stridor. Neck is very supple. Breasts:: Not examined CHEST WALL: No tenderness CHEST:  Normal respiration, bilateral rales when I saw her.  HEART: Regular rate and rhythm.  There are no murmur, rub, or gallops.   BACK: No kyphosis or scoliosis; no CVA tenderness ABDOMEN: soft and non-tender; no masses, no organomegaly, normal abdominal bowel sounds; no pannus; no intertriginous candida. There is no rebound and no distention. Rectal Exam: Not done EXTREMITIES: No bone or joint deformity; age-appropriate arthropathy of the hands and knees; no edema; no ulcerations.  There is no calf tenderness. Genitalia: not examined PULSES: 2+ and symmetric SKIN: Normal hydration no rash or ulceration CNS: Cranial nerves 2-12 grossly intact no focal lateralizing neurologic deficit.  Speech is fluent; uvula elevated with phonation, facial symmetry and tongue midline. DTR are normal bilaterally, cerebella exam is intact, barbinski is negative and strengths are equaled bilaterally.  No sensory loss.   Labs on Admission:  Basic Metabolic Panel:  Recent Labs Lab 11/26/14 0228  NA 143  K 3.3*  CL 117*  CO2 16*  GLUCOSE 159*  BUN 28*  CREATININE 2.14*  CALCIUM 8.5*   Liver Function Tests:  Recent Labs Lab 11/26/14 0228  AST 13*  ALT 11*  ALKPHOS 107  BILITOT 0.6  PROT 5.4*  ALBUMIN 2.1*   CBC:  Recent Labs Lab 11/26/14 0228  WBC 17.4*  NEUTROABS 14.2*  HGB 10.5*  HCT 32.4*  MCV 92.0  PLT 483*   Radiological Exams on Admission: Dg Chest Port 1 View  11/26/2014   CLINICAL DATA:  Leukocytosis.  Altered level of  consciousness.  EXAM: PORTABLE CHEST - 1 VIEW  COMPARISON:  10/29/2014  FINDINGS: Tip of the left upper extremity PICC projects over the upper SVC/distal brachiocephalic vein. Progressive cardiomegaly. Development of vascular congestion and pulmonary edema. There are small bilateral pleural effusions and bibasilar opacities, likely atelectasis. Upper lung zones without focal consolidation. Degenerative change throughout spine. No pneumothorax.  IMPRESSION: 1. Findings consistent with CHF. 2. Tip of the left upper extremity PICC in the proximal SVC/distal brachiocephalic vein.   Electronically Signed   By: Rubye Oaks M.D.   On: 11/26/2014 04:28    Assessment/Plan Present on Admission:  . AKI (acute kidney injury) . Essential hypertension . UTI (lower urinary tract infection)  PLAN:  Her leukocytosis is not new, and I am unsure of the exact etiology, but will culture, and treat her UTI.   Her AKI on CKD may be from worsening of her renal fx, and not just pre-renal.  Will stop her IVF.  Since she went into CHF so quickly, will obtain an ECHO as well.   Will also hold her ACE I.  I will obtain renal US to be sure she does not have hydronephrosis.  For her HTN, will continue with her other meds.  She is stable, full code per prior record, and will be admitted to Dr Blase Mess as per prior arrangement.  I was able to speak with Dr Blase Mess about her this morning.  Thank you, Dr Blase Mess for allowing [place] to participate in the care of your nice patient.   Other plans as per orders.  Code Status: FULL Unk Lightning, MD. [facility] Pager [phone] 7pm to 7am.  11/26/2014, 6:50 AM

## 2014-11-26 NOTE — Progress Notes (Signed)
Patient has had incontinence, but also says she feels she needs to void.  Unable to void on bedpan, bladder scan shows 300 cc.  Urine samples also needed.  Dr. Juanetta GoslingHawkins notified. Orders to place foley.

## 2014-11-26 NOTE — Care Management Note (Signed)
Case Management Note  Patient Details  Name: Tina [name] MRN: [id number] Date of Birth: 07/03/1930  Expected Discharge Date:  11/29/14               Expected Discharge Plan:  Skilled Nursing Facility  In-House Referral:  Clinical Social Work  Discharge planning Services  CM Consult  Post Acute Care Choice:  NA Choice offered to:  NA  DME Arranged:    DME Agency:     HH Arranged:    HH Agency:     Status of Service:  In process, will continue to follow  Medicare Important Message Given:    Date Medicare IM Given:    Medicare IM give by:    Date Additional Medicare IM Given:    Additional Medicare Important Message give by:     If discussed at Long Length of Stay Meetings, dates discussed:    Additional Comments: Pt admitted with AKI. Pt is from [facility], SNF. Pt is drowsy and confused today, unable to complete assessment. Anticipate discharge back to [facility] when ready. CSW is aware of discharge plan and will arrange for return to facility if appropriate at discharge. No CM needs anticipated, will cont to follow.   Tina [name], Tina Malecki Demske, RN 11/26/2014, 2:33 PM

## 2014-11-26 NOTE — ED Notes (Signed)
[facility], pt to be taken upstairs during day shift

## 2014-11-27 ENCOUNTER — Inpatient Hospital Stay (HOSPITAL_COMMUNITY): Payer: Medicare Other

## 2014-11-27 ENCOUNTER — Encounter (HOSPITAL_COMMUNITY): Payer: Self-pay

## 2014-11-27 DIAGNOSIS — R0600 Dyspnea, unspecified: Secondary | ICD-10-CM

## 2014-11-27 DIAGNOSIS — N390 Urinary tract infection, site not specified: Secondary | ICD-10-CM

## 2014-11-27 LAB — CBC WITH DIFFERENTIAL/PLATELET
Basophils Absolute: 0.2 10*3/uL — ABNORMAL HIGH (ref 0.0–0.1)
Basophils Relative: 1 % (ref 0–1)
Eosinophils Absolute: 0.9 10*3/uL — ABNORMAL HIGH (ref 0.0–0.7)
Eosinophils Relative: 5 % (ref 0–5)
HCT: 33.7 % — ABNORMAL LOW (ref 36.0–46.0)
Hemoglobin: 10.8 g/dL — ABNORMAL LOW (ref 12.0–15.0)
Lymphocytes Relative: 10 % — ABNORMAL LOW (ref 12–46)
Lymphs Abs: 1.8 10*3/uL (ref 0.7–4.0)
MCH: 30.2 pg (ref 26.0–34.0)
MCHC: 32 g/dL (ref 30.0–36.0)
MCV: 94.1 fL (ref 78.0–100.0)
MONOS PCT: 8 % (ref 3–12)
Monocytes Absolute: 1.5 10*3/uL — ABNORMAL HIGH (ref 0.1–1.0)
NEUTROS ABS: 13.6 10*3/uL — AB (ref 1.7–7.7)
Neutrophils Relative %: 76 % (ref 43–77)
Platelets: 453 10*3/uL — ABNORMAL HIGH (ref 150–400)
RBC: 3.58 MIL/uL — ABNORMAL LOW (ref 3.87–5.11)
RDW: 19.2 % — ABNORMAL HIGH (ref 11.5–15.5)
WBC: 17.9 10*3/uL — ABNORMAL HIGH (ref 4.0–10.5)

## 2014-11-27 LAB — GLUCOSE, CAPILLARY
GLUCOSE-CAPILLARY: 155 mg/dL — AB (ref 65–99)
GLUCOSE-CAPILLARY: 153 mg/dL — AB (ref 65–99)
GLUCOSE-CAPILLARY: 88 mg/dL (ref 65–99)
Glucose-Capillary: 84 mg/dL (ref 65–99)

## 2014-11-27 LAB — BASIC METABOLIC PANEL
Anion gap: 12 (ref 5–15)
BUN: 28 mg/dL — ABNORMAL HIGH (ref 6–20)
CO2: 15 mmol/L — ABNORMAL LOW (ref 22–32)
Calcium: 8.3 mg/dL — ABNORMAL LOW (ref 8.9–10.3)
Chloride: 114 mmol/L — ABNORMAL HIGH (ref 101–111)
Creatinine, Ser: 2.24 mg/dL — ABNORMAL HIGH (ref 0.44–1.00)
GFR, EST AFRICAN AMERICAN: 22 mL/min — AB (ref >60)
GFR, EST NON AFRICAN AMERICAN: 19 mL/min — AB (ref >60)
GLUCOSE: 153 mg/dL — AB (ref 65–99)
POTASSIUM: 3.8 mmol/L (ref 3.5–5.1)
SODIUM: 141 mmol/L (ref 135–145)

## 2014-11-27 LAB — FOLATE: Folate: >80 ng/mL (ref >5.9)

## 2014-11-27 LAB — URINE CULTURE
COLONY COUNT: NO GROWTH
Culture: NO GROWTH

## 2014-11-27 MED ORDER — FUROSEMIDE 10 MG/ML IJ SOLN
80.0000 mg | Freq: Two times a day (BID) | INTRAMUSCULAR | Status: DC
  Administered 2014-11-27 – 2014-11-29 (×4): 80 mg via INTRAVENOUS
  Filled 2014-11-27 (×6): qty 8

## 2014-11-27 MED ORDER — PIPERACILLIN-TAZOBACTAM 3.375 G IVPB
3.3750 g | Freq: Three times a day (TID) | INTRAVENOUS | Status: DC
  Administered 2014-11-27 – 2014-11-28 (×4): 3.375 g via INTRAVENOUS
  Filled 2014-11-27 (×9): qty 50

## 2014-11-27 NOTE — Progress Notes (Signed)
SLP Cancellation Note  Patient Details Name: Tina [name] MRN: [id number] DOB: 01/11/1931   Cancelled treatment:       Reason Eval/Treat Not Completed: Other (comment) (scheduling conflict; will be seen Fri AM)  Thank you,  Havery MorosDabney Porter, CCC-SLP [phone]  PORTER,DABNEY 11/27/2014, 5:21 PM

## 2014-11-27 NOTE — Discharge Planning (Signed)
[facility] and RN assessment displays current stability.  Report called to Carrie MewJasmine Adams, RN and patient being transferred via [facility] (6E rm 2C). Daughter notified of transfer and rm #. Foley and PICC line remaining intact for transfer.

## 2014-11-27 NOTE — Progress Notes (Signed)
Patient ID: Tina [name], female   DOB: 02/01/1931, 81 y.o.   MRN: [id number] PCP:   Fredirick MaudlinHAWKINS,EDWARD L, MD   Subjective Pt with no complaints, denies pain.  Pt transferred here from [place] due to family request from dr Juanetta Goslinghawkins service.  Admitted yesterday with acute on ckd, uti, chf, ams.  Past Medical History: Past Medical History  Diagnosis Date  . Diabetes mellitus   . Hypertension   . Osteopenia   . Peripheral vascular disease, unspecified   . GERD (gastroesophageal reflux disease)   . Arthritis   . Shortness of breath     exersion  . Pneumonia     child  . Anemia     hx  . Fall at home October 09, 2013  . DM (diabetes mellitus) type II uncontrolled, periph vascular disorder 09/17/2014  . Hepatitis     "a"   Past Surgical History  Procedure Laterality Date  . Appendectomy  1994  . Eye surgery Bilateral 08  . Lumbar laminectomy/decompression microdiscectomy N/A 07/09/2013    Procedure: LUMBAR LAMINECTOMY/DECOMPRESSION MICRODISCECTOMY LUMBAR TWO TO LUMBAR FIVE;  Surgeon: Karn CassisErnesto M Botero, MD;  Location: [facility];  Service: Neurosurgery;  Laterality: N/A;  . Lumbar laminectomy/decompression microdiscectomy Left 09/17/2014    Procedure: Left Lumbar four-five Foraminotomy;  Surgeon: Hilda LiasErnesto Botero, MD;  Location: [facility];  Service: Neurosurgery;  Laterality: Left;  Left L4-5 Foraminotomy  . Hip arthroplasty Left 09/28/2014    Procedure: HEMI ARTHROPLASTY;  Surgeon: Sheral Apleyimothy D Murphy, MD;  [facility];  Service: Orthopedics;  Laterality: Left;    Medications: Prior to Admission medications   Medication Sig Start Date End Date Taking? Authorizing Provider  acetaminophen (TYLENOL) 650 MG CR tablet Take 650 mg by mouth every 6 (six) hours as needed for pain.   Yes Historical Provider, MD  Amino Acids-Protein Hydrolys (FEEDING SUPPLEMENT, PRO-STAT SUGAR FREE 64,) LIQD Take 30 mLs by mouth 3 (three) times daily with meals.    Yes Historical Provider, MD  aspirin EC 81  MG tablet Take 1 tablet (81 mg total) by mouth daily. Start after 30 days of aspirin 325mg  10/29/14  Yes Catarina Hartshornavid Tat, MD  cholecalciferol (VITAMIN D) 1000 UNITS tablet Take 1,000 Units by mouth daily.   Yes Historical Provider, MD  colesevelam (WELCHOL) 625 MG tablet Take 1,875 mg by mouth 2 (two) times daily with a meal.    Yes Historical Provider, MD  ezetimibe (ZETIA) 10 MG tablet Take 10 mg by mouth daily at 6 PM.    Yes Historical Provider, MD  hydrALAZINE (APRESOLINE) 25 MG tablet Take 25 mg by mouth daily. Daily if blood pressure is more than 160/90   Yes Historical Provider, MD  HYDROcodone-acetaminophen (NORCO) 5-325 MG per tablet Take 1-2 tablets by mouth every 6 (six) hours as needed for moderate pain. Patient taking differently: Take 1 tablet by mouth every 6 (six) hours as needed for moderate pain.  09/30/14  Yes Catarina Hartshornavid Tat, MD  LORazepam (ATIVAN) 0.5 MG tablet Take 1 tablet (0.5 mg total) by mouth every 6 (six) hours as needed for anxiety. 09/30/14  Yes Catarina Hartshornavid Tat, MD  Methylfol-Methylcob-Acetylcyst 6-2-600 MG TABS Take 1 tablet by mouth daily.   Yes Historical Provider, MD  pantoprazole (PROTONIX) 40 MG tablet Take 40 mg by mouth daily.   Yes Historical Provider, MD  polyvinyl alcohol (LIQUIFILM TEARS) 1.4 % ophthalmic solution Place 1 drop into both eyes 3 (three) times daily as needed for dry eyes.   Yes  Historical Provider, MD  rosuvastatin (CRESTOR) 20 MG tablet Take 20 mg by mouth daily.    Yes Historical Provider, MD  senna-docusate (SENOKOT-S) 8.6-50 MG per tablet Take 1 tablet by mouth 2 (two) times daily. Hold for loose stool   Yes Historical Provider, MD  acetaminophen (TYLENOL) 500 MG tablet Take 1,000 mg by mouth every 6 (six) hours as needed (pain).     Historical Provider, MD  guaiFENesin (MUCINEX) 600 MG 12 hr tablet Take 600 mg by mouth 2 (two) times daily. 5 day course started 09/23/14    Historical Provider, MD  insulin aspart (NOVOLOG) 100 UNIT/ML injection Inject 1-9 Units into  the skin 3 (three) times daily before meals. Per sliding scale: CBG 121-150 1 unit, 151-200 2 units; 201-250 3 units; 251-300 5 units, 301-351 7 units, 351-400 9 units;>401 call MD    Historical Provider, MD  ipratropium-albuterol (DUONEB) 0.5-2.5 (3) MG/3ML SOLN Take 3 mLs by nebulization every 6 (six) hours. 5 day course started 09/24/14    Historical Provider, MD  lisinopril (PRINIVIL,ZESTRIL) 10 MG tablet Take 10 mg by mouth daily.      Historical Provider, MD  omeprazole (PRILOSEC) 40 MG capsule Take 40 mg by mouth daily at 6 (six) AM.     Historical Provider, MD  ondansetron (ZOFRAN) 4 MG tablet Take 1 tablet (4 mg total) by mouth every 8 (eight) hours as needed for nausea or vomiting. 09/28/14   Brittney Tresa Endo, PA-C  pioglitazone-glimepiride (DUETACT) 30-4 MG per tablet Take 1 tablet by mouth daily at 6 (six) AM.     Historical Provider, MD  sitaGLIPtin (JANUVIA) 100 MG tablet Take 100 mg by mouth daily.      Historical Provider, MD    Allergies:  No Known Allergies  Social History:  reports that she quit smoking about 53 years ago. Her smoking use included Cigarettes. She has a .3 pack-year smoking history. She has never used smokeless tobacco. She reports that she does not drink alcohol or use illicit drugs.  Family History: Family History  Problem Relation Age of Onset  . Cancer Mother   . Hypertension Mother   . Heart disease Father     before age 7  . Heart attack Father   . Hyperlipidemia Son     Physical Exam: Filed Vitals:   11/26/14 2144 11/27/14 0528 11/27/14 1448 11/27/14 1944  BP: 157/97 144/81 132/79 134/74  Pulse: 92 98 109 92  Temp: 98 F (36.7 C) 98.1 F (36.7 C) 97.6 F (36.4 C) 97.5 F (36.4 C)  TempSrc: Axillary Axillary Oral Oral  Resp: Height:     (1.651 m)  Weight:  100.2 kg (220 lb 14.4 oz)  102.694 kg (226 lb 6.4 oz)  SpO2: 96% 94% 94% 96%   General appearance: alert, cooperative and no distress Head: Normocephalic, without  obvious abnormality, atraumatic Eyes: negative Nose: Nares normal. Septum midline. Mucosa normal. No drainage or sinus tenderness. Neck: no JVD and supple, symmetrical, trachea midline Lungs: clear to auscultation bilaterally Heart: regular rate and rhythm, S1, S2 normal, no murmur, click, rub or gallop Abdomen: soft, non-tender; bowel sounds normal; no masses,  no organomegaly Extremities: edema 2+ Pulses: 2+ and symmetric Skin: Skin color, texture, turgor normal. No rashes or lesions Neurologic: Grossly normal  Overall weak, no focal deficits.  Pleasantly confused    Labs on Admission:   Recent Labs  11/26/14 0228 11/27/14 0707  NA 143 141  K 3.3* 3.8  CL 117* 114*  CO2 16* 15*  GLUCOSE 159* 153*  BUN 28* 28*  CREATININE 2.14* 2.24*  CALCIUM 8.5* 8.3*  MG 1.6*  --   PHOS 3.5  --     Recent Labs  11/26/14 0228  AST 13*  ALT 11*  ALKPHOS 107  BILITOT 0.6  PROT 5.4*  ALBUMIN 2.1*    Recent Labs  11/26/14 0228 11/27/14 0707  WBC 17.4* 17.9*  NEUTROABS 14.2* 13.6*  HGB 10.5* 10.8*  HCT 32.4* 33.7*  MCV 92.0 94.1  PLT 483* 453*    Recent Labs  11/26/14 0913  CKTOTAL 25*  CKMB 5.7*    Recent Labs  11/26/14 0913  FOLATE >80.0  RETICCTPCT 2.4    Radiological Exams on Admission: Dg Chest 1 View  11/27/2014   CLINICAL DATA:  Cough  EXAM: CHEST  1 VIEW  COMPARISON:  11/26/2014  FINDINGS: Cardiomegaly again noted. Study is limited by poor inspiration. Persistent mild congestion/ interstitial edema. Left arm PICC line with tip at the junction of SVC with brachiocephalic vein is unchanged in position. There is no pneumothorax. Mild basilar atelectasis. Question small left pleural effusion.  IMPRESSION: Study is limited by poor inspiration. Persistent mild congestion/ interstitial edema. Left arm PICC line with tip at the junction of SVC with brachiocephalic vein is unchanged in position. There is no pneumothorax. Mild basilar atelectasis. Question small left  pleural effusion.   Electronically Signed   By: Natasha Mead M.D.   On: 11/27/2014 08:51   Ct Head Wo Contrast  11/27/2014   CLINICAL DATA:  Worsening confusion, incontinence  EXAM: CT HEAD WITHOUT CONTRAST  TECHNIQUE: Contiguous axial images were obtained from the base of the skull through the vertex without intravenous contrast.  COMPARISON:  Brain MRI 10/16/2014  FINDINGS: No intracranial hemorrhage, mass effect or midline shift. Stable cerebral atrophy. Stable periventricular and patchy subcortical chronic white matter disease. Stable lacunar infarct in left corona radiata. No definite acute cortical infarction. No mass lesion is noted on this unenhanced scan. Paranasal sinuses and mastoid air cells are unremarkable.  IMPRESSION: No acute intracranial abnormality. Stable atrophy and chronic white matter disease. Stable lacunar infarct in left corona radiata. No definite acute cortical infarction.   Electronically Signed   By: Natasha Mead M.D.   On: 11/27/2014 08:53   US Renal  11/26/2014   CLINICAL DATA:  Acute renal insufficiency  EXAM: RENAL / URINARY TRACT ULTRASOUND COMPLETE  COMPARISON:  None.  FINDINGS: Right Kidney:  Length: 10.5 cm. Echogenicity and renal cortical thickness are within normal limits. No mass, perinephric fluid, or hydronephrosis visualized. No sonographically demonstrable calculus or ureterectasis.  Left Kidney:  Length: 9.2 cm. Echogenicity and renal cortical thickness are within normal limits. No mass, perinephric fluid, or hydronephrosis visualized. No sonographically demonstrable calculus or ureterectasis.  Bladder:  Appears normal for degree of bladder distention.  IMPRESSION: Study within normal limits.   Electronically Signed   By: Bretta Bang III M.D.   On: 11/26/2014 11:02   Dg Chest Port 1 View  11/26/2014   CLINICAL DATA:  Leukocytosis.  Altered level of consciousness.  EXAM: PORTABLE CHEST - 1 VIEW  COMPARISON:  10/29/2014  FINDINGS: Tip of the left upper extremity  PICC projects over the upper SVC/distal brachiocephalic vein. Progressive cardiomegaly. Development of vascular congestion and pulmonary edema. There are small bilateral pleural effusions and bibasilar opacities, likely atelectasis. Upper lung zones without focal consolidation. Degenerative change throughout spine. No pneumothorax.  IMPRESSION: 1. Findings consistent  with CHF. 2. Tip of the left upper extremity PICC in the proximal SVC/distal brachiocephalic vein.   Electronically Signed   By: Rubye Oaks M.D.   On: 11/26/2014 04:28   Dg Chest Port 1 View  10/29/2014   CLINICAL DATA:  Left-sided PICC line placement  EXAM: PORTABLE CHEST - 1 VIEW  COMPARISON:  Portable chest x-ray of 09/27/2014  FINDINGS: A left-sided PICC line is present with the tip seen to overlie the upper SVC. No pneumothorax is seen. The lungs are clear. Heart size is stable.  IMPRESSION: Left PICC line tip overlies the upper SVC. No pneumothorax is noted.   Electronically Signed   By: Dwyane Dee M.D.   On: 10/29/2014 11:01    Assessment/Plan  81 yo female admitted for acute on ckd, uti, chf, and ams transferred from [place]  Principal Problem:   AKI (acute kidney injury)-  Not improving.  On lasix per nephro at [place]  iv q 12.  Watch cr closely, foley in place.  Renal US normal.  Lactic acid was nml yesterday.  Active Problems:   FTT (failure to thrive) in adult   Leukocytosis   Encephalopathy-  Seems to be some chronic nature to this since SNF placement   DM type 2 (diabetes mellitus, type 2)- ssi   Essential hypertension   UTI (lower urinary tract infection)- on iv zosyn for uti and also to cover for possible aspiration   Possible aspiration -  ST consult is pending   chf ?new-  Iv lasix, echo shows EF 35%.  Poor candidate for any aggressive work up such as cath.  Medically manage, ace on hold due to [facility].    All orders done by dr Juanetta Gosling.  Full code.  Admit to renal floor.  Angelia Hazell A 11/27/2014,  9:52 PM

## 2014-11-27 NOTE — Progress Notes (Signed)
Pt transferred to 6E02 [facility]. Vital signs are stable, orders were reviewed and implemented. Patient oriented to self and place. See doc flowsheets for full assessment. Patient oriented to unit and unit staff. Call light within reach, will continue to monitor.   Feliciana RossettiLaura Emiley Digiacomo, RN, BSN

## 2014-11-27 NOTE — Progress Notes (Signed)
Subjective: Interval History: has complaints of poor appetite and some nausea but no vomiting. Patient also complains of weakness but denies any difficulty in breathing..  Objective: Vital signs in last 24 hours: Temp:  [98 F (36.7 C)-98.8 F (37.1 C)] 98.1 F (36.7 C) (06/02 0528) Pulse Rate:  [92-98] 98 (06/02 0528) Resp:  [20-21] 20 (06/02 0528) BP: (144-157)/(81-97) 144/81 mmHg (06/02 0528) SpO2:  [94 %-96 %] 94 % (06/02 0528) Weight:  [100.2 kg (220 lb 14.4 oz)] 100.2 kg (220 lb 14.4 oz) (06/02 0528) Weight change:   Intake/Output from previous day: 06/01 0701 - 06/02 0700 In: 800 [P.O.:150; I.V.:600; IV Piggyback:50] Out: 900 [Urine:900] Intake/Output this shift: Total I/O In: -  Out: 100 [Urine:100]  General appearance: alert, cooperative and no distress Resp: diminished breath sounds bilaterally Cardio: regular rate and rhythm, S1, S2 normal, no murmur, click, rub or gallop GI: soft, non-tender; bowel sounds normal; no masses,  no organomegaly Extremities: edema 2+ edema bilaterally  Lab Results:  Recent Labs  11/26/14 0228 11/27/14 0707  WBC 17.4* 17.9*  HGB 10.5* 10.8*  HCT 32.4* 33.7*  PLT 483* 453*   BMET:  Recent Labs  11/26/14 0228 11/27/14 0707  NA 143 141  K 3.3* 3.8  CL 117* 114*  CO2 16* 15*  GLUCOSE 159* 153*  BUN 28* 28*  CREATININE 2.14* 2.24*  CALCIUM 8.5* 8.3*   No results for input(s): PTH in the last 72 hours. Iron Studies: No results for input(s): IRON, TIBC, TRANSFERRIN, FERRITIN in the last 72 hours.  Studies/Results: Dg Chest 1 View  11/27/2014   CLINICAL DATA:  Cough  EXAM: CHEST  1 VIEW  COMPARISON:  11/26/2014  FINDINGS: Cardiomegaly again noted. Study is limited by poor inspiration. Persistent mild congestion/ interstitial edema. Left arm PICC line with tip at the junction of SVC with brachiocephalic vein is unchanged in position. There is no pneumothorax. Mild basilar atelectasis. Question small left pleural effusion.   IMPRESSION: Study is limited by poor inspiration. Persistent mild congestion/ interstitial edema. Left arm PICC line with tip at the junction of SVC with brachiocephalic vein is unchanged in position. There is no pneumothorax. Mild basilar atelectasis. Question small left pleural effusion.   Electronically Signed   By: Natasha Mead M.D.   On: 11/27/2014 08:51   Ct Head Wo Contrast  11/27/2014   CLINICAL DATA:  Worsening confusion, incontinence  EXAM: CT HEAD WITHOUT CONTRAST  TECHNIQUE: Contiguous axial images were obtained from the base of the skull through the vertex without intravenous contrast.  COMPARISON:  Brain MRI 10/16/2014  FINDINGS: No intracranial hemorrhage, mass effect or midline shift. Stable cerebral atrophy. Stable periventricular and patchy subcortical chronic white matter disease. Stable lacunar infarct in left corona radiata. No definite acute cortical infarction. No mass lesion is noted on this unenhanced scan. Paranasal sinuses and mastoid air cells are unremarkable.  IMPRESSION: No acute intracranial abnormality. Stable atrophy and chronic white matter disease. Stable lacunar infarct in left corona radiata. No definite acute cortical infarction.   Electronically Signed   By: Natasha Mead M.D.   On: 11/27/2014 08:53   US Renal  11/26/2014   CLINICAL DATA:  Acute renal insufficiency  EXAM: RENAL / URINARY TRACT ULTRASOUND COMPLETE  COMPARISON:  None.  FINDINGS: Right Kidney:  Length: 10.5 cm. Echogenicity and renal cortical thickness are within normal limits. No mass, perinephric fluid, or hydronephrosis visualized. No sonographically demonstrable calculus or ureterectasis.  Left Kidney:  Length: 9.2 cm. Echogenicity and renal  cortical thickness are within normal limits. No mass, perinephric fluid, or hydronephrosis visualized. No sonographically demonstrable calculus or ureterectasis.  Bladder:  Appears normal for degree of bladder distention.  IMPRESSION: Study within normal limits.    Electronically Signed   By: Bretta BangWilliam  Woodruff III M.D.   On: 11/26/2014 11:02   Dg Chest Port 1 View  11/26/2014   CLINICAL DATA:  Leukocytosis.  Altered level of consciousness.  EXAM: PORTABLE CHEST - 1 VIEW  COMPARISON:  10/29/2014  FINDINGS: Tip of the left upper extremity PICC projects over the upper SVC/distal brachiocephalic vein. Progressive cardiomegaly. Development of vascular congestion and pulmonary edema. There are small bilateral pleural effusions and bibasilar opacities, likely atelectasis. Upper lung zones without focal consolidation. Degenerative change throughout spine. No pneumothorax.  IMPRESSION: 1. Findings consistent with CHF. 2. Tip of the left upper extremity PICC in the proximal SVC/distal brachiocephalic vein.   Electronically Signed   By: Rubye OaksMelanie  Ehinger M.D.   On: 11/26/2014 04:28    I have reviewed the patient's current medications.  Assessment/Plan: Problem #1 acute kidney injury: Presently her BUN and creatinine is increasing. Patient is non-oliguric. The etiology was thought to be secondary to ATN/ACE inhibitor/cardiorenal. Patient with poor appetite. Problem #2 hypokalemia: Patient on potassium supplement has improved Problem #3 diabetes Problem #4 CHF: Patient is still with significant edema Problem #5 UTI: Patient presently is a febrile, however her white cell count is still high. Problem #6 hypertension: Her blood pressure is reasonably controlled  Problem #7 anemia: Her hemoglobin is stable Problem #8 low CO2: Possibly metabolic Plan: We'll continue with potassium supplement 2] will start patient on Lasix 80 mg IV twice a day 3] we'll check her basic metabolic panel in the morning.   LOS: 1 day   Emme Rosenau S 11/27/2014,11:29 AM

## 2014-11-27 NOTE — Progress Notes (Signed)
ANTIBIOTIC CONSULT NOTE - INITIAL  Pharmacy Consult for Zosyn Indication: pneumonia  No Known Allergies  Patient Measurements: Height: 5\' 5"  (165.1 cm) Weight: 220 lb 14.4 oz (100.2 kg) IBW/kg (Calculated) : 57  Vital Signs: Temp: 98.1 F (36.7 C) (06/02 0528) Temp Source: Axillary (06/02 0528) BP: 144/81 mmHg (06/02 0528) Pulse Rate: 98 (06/02 0528) Intake/Output from previous day: 06/01 0701 - 06/02 0700 In: 800 [P.O.:150; I.V.:600; IV Piggyback:50] Out: 900 [Urine:900] Intake/Output from this shift: Total I/O In: -  Out: 100 [Urine:100]  Labs:  Recent Labs  11/26/14 0228 11/26/14 1700 11/27/14 0707  WBC 17.4*  --  17.9*  HGB 10.5*  --  10.8*  PLT 483*  --  453*  LABCREA  --  38.01  --   CREATININE 2.14*  --  2.24*   Estimated Creatinine Clearance: 23.1 mL/min (by C-G formula based on Cr of 2.24). No results for input(s): VANCOTROUGH, VANCOPEAK, VANCORANDOM, GENTTROUGH, GENTPEAK, GENTRANDOM, TOBRATROUGH, TOBRAPEAK, TOBRARND, AMIKACINPEAK, AMIKACINTROU, AMIKACIN in the last 72 hours.   Microbiology: Recent Results (from the past 720 hour(s))  Urine culture     Status: None   Collection Time: 11/26/14  2:00 AM  Result Value Ref Range Status   Specimen Description URINE, CATHETERIZED  Final   Special Requests NONE  Final   Colony Count NO GROWTH Performed at [facility]   Final   Culture NO GROWTH Performed at [facility]   Final   Report Status 11/27/2014 FINAL  Final  MRSA PCR Screening     Status: None   Collection Time: 11/26/14 12:00 PM  Result Value Ref Range Status   MRSA by PCR NEGATIVE NEGATIVE Final    Comment:        The GeneXpert MRSA Assay (FDA approved for NASAL specimens only), is one component of a comprehensive MRSA colonization surveillance program. It is not intended to diagnose MRSA infection nor to guide or monitor treatment for MRSA infections.     Medical History: Past Medical History  Diagnosis Date   . Diabetes mellitus   . Hypertension   . Osteopenia   . Peripheral vascular disease, unspecified   . GERD (gastroesophageal reflux disease)   . Arthritis   . Shortness of breath     exersion  . Pneumonia     child  . Anemia     hx  . Hepatitis     "a"  . Fall at home October 09, 2013  . DM (diabetes mellitus) type II uncontrolled, periph vascular disorder 09/17/2014   Anti-infectives    Start     Dose/Rate Route Frequency Ordered Stop   11/27/14 1000  piperacillin-tazobactam (ZOSYN) IVPB 3.375 g     3.375 g 12.5 mL/hr over 240 Minutes Intravenous Every 8 hours 11/27/14 0831     11/27/14 0300  cefTRIAXone (ROCEPHIN) 1 g in dextrose 5 % 50 mL IVPB  Status:  Discontinued     1 g 100 mL/hr over 30 Minutes Intravenous Every 24 hours 11/26/14 0802 11/27/14 0829   11/26/14 0745  cefTRIAXone (ROCEPHIN) 1 g in dextrose 5 % 50 mL IVPB  Status:  Discontinued     1 g 100 mL/hr over 30 Minutes Intravenous Every 24 hours 11/26/14 0731 11/26/14 0803   11/26/14 0345  cefTRIAXone (ROCEPHIN) 1 g in dextrose 5 % 50 mL IVPB     1 g 100 mL/hr over 30 Minutes Intravenous  Once 11/26/14 0332 11/26/14 0519      Assessment: 81yo female  admitted with suspected aspiration pna.  Asked to initiate Zosyn. SCr is elevated.  ClCr estimated ~ 25ml/min  Goal of Therapy:  Eradicate infection.  Plan:  Zosyn 3.375gm IV q8h, each dose over 4 hrs Monitor labs, renal fxn, cultures and sensitivities  Margo Aye, Cynthya Yam A 11/27/2014,12:41 PM

## 2014-11-27 NOTE — Progress Notes (Signed)
Subjective: She remains confused. She had an episode of labored breathing again yesterday evening and although this is been presumed to be from CHF I wonder considering her confusion and her exam this morning if she is aspirating. She has apparently been having severe problems with confusion for the last several months. She had laminectomy for neurogenic claudication then fell and had a fractured hip requiring open reduction internal fixation and she has been in a nursing home since that. She's been confused during the entire time at the nursing home by history. Prior to that she had been alert oriented and independent.  Objective: Vital signs in last 24 hours: Temp:  [98 F (36.7 C)-99.7 F (37.6 C)] 98.1 F (36.7 C) (06/02 0528) Pulse Rate:  [92-98] 98 (06/02 0528) Resp:  [20-21] 20 (06/02 0528) BP: (144-157)/(81-97) 144/81 mmHg (06/02 0528) SpO2:  [94 %-96 %] 94 % (06/02 0528) Weight:  [100.2 kg (220 lb 14.4 oz)] 100.2 kg (220 lb 14.4 oz) (06/02 0528) Weight change:  Last BM Date:  (unsure, small smear in brief when arrived to floor)  Intake/Output from previous day: 06/01 0701 - 06/02 0700 In: 800 [P.O.:150; I.V.:600; IV Piggyback:50] Out: 900 [Urine:900]  PHYSICAL EXAM General appearance: Confused response with mumbling, obese Resp: She has significant bilateral rhonchi that I did not hear yesterday Cardio: regular rate and rhythm, S1, S2 normal, no murmur, click, rub or gallop GI: soft, non-tender; bowel sounds normal; no masses,  no organomegaly Extremities: extremities normal, atraumatic, no cyanosis or edema  Lab Results:  Results for orders placed or performed during the hospital encounter of 11/26/14 (from the past 48 hour(s))  Urinalysis, Routine w reflex microscopic (not at [facility])     Status: Abnormal   Collection Time: 11/26/14  2:00 AM  Result Value Ref Range   Color, Urine YELLOW YELLOW   APPearance HAZY (A) CLEAR   Specific Gravity, Urine 1.025 1.005 - 1.030   pH  5.5 5.0 - 8.0   Glucose, UA NEGATIVE NEGATIVE mg/dL   Hgb urine dipstick NEGATIVE NEGATIVE   Bilirubin Urine NEGATIVE NEGATIVE   Ketones, ur NEGATIVE NEGATIVE mg/dL   Protein, ur NEGATIVE NEGATIVE mg/dL   Urobilinogen, UA 0.2 0.0 - 1.0 mg/dL   Nitrite NEGATIVE NEGATIVE   Leukocytes, UA LARGE (A) NEGATIVE  Urine microscopic-add on     Status: Abnormal   Collection Time: 11/26/14  2:00 AM  Result Value Ref Range   Squamous Epithelial / LPF FEW (A) RARE   WBC, UA TOO NUMEROUS TO COUNT <3 WBC/hpf   RBC / HPF 0-2 <3 RBC/hpf   Bacteria, UA MANY (A) RARE  CBC with Differential     Status: Abnormal   Collection Time: 11/26/14  2:28 AM  Result Value Ref Range   WBC 17.4 (H) 4.0 - 10.5 K/uL   RBC 3.52 (L) 3.87 - 5.11 MIL/uL   Hemoglobin 10.5 (L) 12.0 - 15.0 g/dL   HCT 32.4 (L) 36.0 - 46.0 %   MCV 92.0 78.0 - 100.0 fL   MCH 29.8 26.0 - 34.0 pg   MCHC 32.4 30.0 - 36.0 g/dL   RDW 18.4 (H) 11.5 - 15.5 %   Platelets 483 (H) 150 - 400 K/uL   Neutrophils Relative % 81 (H) 43 - 77 %   Neutro Abs 14.2 (H) 1.7 - 7.7 K/uL   Lymphocytes Relative 8 (L) 12 - 46 %   Lymphs Abs 1.4 0.7 - 4.0 K/uL   Monocytes Relative 8 3 - 12 %  Monocytes Absolute 1.4 (H) 0.1 - 1.0 K/uL   Eosinophils Relative 2 0 - 5 %   Eosinophils Absolute 0.3 0.0 - 0.7 K/uL   Basophils Relative 1 0 - 1 %   Basophils Absolute 0.2 (H) 0.0 - 0.1 K/uL  Comprehensive metabolic panel     Status: Abnormal   Collection Time: 11/26/14  2:28 AM  Result Value Ref Range   Sodium 143 135 - 145 mmol/L   Potassium 3.3 (L) 3.5 - 5.1 mmol/L   Chloride 117 (H) 101 - 111 mmol/L   CO2 16 (L) 22 - 32 mmol/L   Glucose, Bld 159 (H) 65 - 99 mg/dL   BUN 28 (H) 6 - 20 mg/dL   Creatinine, Ser 2.14 (H) 0.44 - 1.00 mg/dL   Calcium 8.5 (L) 8.9 - 10.3 mg/dL   Total Protein 5.4 (L) 6.5 - 8.1 g/dL   Albumin 2.1 (L) 3.5 - 5.0 g/dL   AST 13 (L) 15 - 41 U/L   ALT 11 (L) 14 - 54 U/L   Alkaline Phosphatase 107 38 - 126 U/L   Total Bilirubin 0.6 0.3 - 1.2  mg/dL   GFR calc non Af Amer 20 (L) >60 mL/min   GFR calc Af Amer 24 (L) >60 mL/min    Comment: (NOTE) The eGFR has been calculated using the CKD EPI equation. This calculation has not been validated in all clinical situations. eGFR's persistently <60 mL/min signify possible Chronic Kidney Disease.    Anion gap 10 5 - 15  Magnesium     Status: Abnormal   Collection Time: 11/26/14  2:28 AM  Result Value Ref Range   Magnesium 1.6 (L) 1.7 - 2.4 mg/dL  Phosphorus     Status: None   Collection Time: 11/26/14  2:28 AM  Result Value Ref Range   Phosphorus 3.5 2.5 - 4.6 mg/dL  Glucose, capillary     Status: Abnormal   Collection Time: 11/26/14  8:15 AM  Result Value Ref Range   Glucose-Capillary 149 (H) 65 - 99 mg/dL  Lactic acid, plasma     Status: None   Collection Time: 11/26/14  9:12 AM  Result Value Ref Range   Lactic Acid, Venous 1.2 0.5 - 2.0 mmol/L   CK total and CKMB (cardiac)     Status: Abnormal   Collection Time: 11/26/14  9:13 AM  Result Value Ref Range   Total CK 25 (L) 38 - 234 U/L   CK, MB 5.7 (H) 0.5 - 5.0 ng/mL   Relative Index RELATIVE INDEX IS INVALID 0.0 - 2.5    Comment: WHEN CK < 100 U/L        Performed at [facility]   Folate     Status: None   Collection Time: 11/26/14  9:13 AM  Result Value Ref Range   Folate >80.0 >5.9 ng/mL    Comment: RESULTS CONFIRMED BY MANUAL DILUTION Performed at [facility]   Reticulocytes     Status: Abnormal   Collection Time: 11/26/14  9:13 AM  Result Value Ref Range   Retic Ct Pct 2.4 0.4 - 3.1 %   RBC. 3.68 (L) 3.87 - 5.11 MIL/uL   Retic Count, Manual 88.3 19.0 - 186.0 K/uL  Glucose, capillary     Status: Abnormal   Collection Time: 11/26/14 11:13 AM  Result Value Ref Range   Glucose-Capillary 165 (H) 65 - 99 mg/dL  MRSA PCR Screening     Status: None   Collection Time: 11/26/14  12:00 PM  Result Value Ref Range   MRSA by PCR NEGATIVE NEGATIVE    Comment:        The GeneXpert MRSA Assay  (FDA approved for NASAL specimens only), is one component of a comprehensive MRSA colonization surveillance program. It is not intended to diagnose MRSA infection nor to guide or monitor treatment for MRSA infections.   Glucose, capillary     Status: Abnormal   Collection Time: 11/26/14  4:26 PM  Result Value Ref Range   Glucose-Capillary 154 (H) 65 - 99 mg/dL  Blood gas, arterial     Status: Abnormal   Collection Time: 11/26/14  4:35 PM  Result Value Ref Range   O2 Content 2.0 L/min   Delivery systems NASAL CANNULA    pH, Arterial 7.355 7.350 - 7.450   pCO2 arterial 24.8 (L) 35.0 - 45.0 mmHg   pO2, Arterial 65.9 (L) 80.0 - 100.0 mmHg   Bicarbonate 13.5 (L) 20.0 - 24.0 mEq/L   TCO2 12.4 0 - 100 mmol/L   Acid-base deficit 10.9 (H) 0.0 - 2.0 mmol/L   O2 Saturation 91.7 %   Patient temperature 37.0    Collection site RIGHT RADIAL    Drawn by [id number]    Sample type ARTERIAL    Allens test (pass/fail) PASS PASS  Sodium, urine, random     Status: None   Collection Time: 11/26/14  5:00 PM  Result Value Ref Range   Sodium, Ur 93 mmol/L  Creatinine, urine, random     Status: None   Collection Time: 11/26/14  5:00 PM  Result Value Ref Range   Creatinine, Urine 38.01 mg/dL  Glucose, capillary     Status: Abnormal   Collection Time: 11/26/14 10:50 PM  Result Value Ref Range   Glucose-Capillary 165 (H) 65 - 99 mg/dL   Comment 1 Notify RN   Basic metabolic panel     Status: Abnormal   Collection Time: 11/27/14  7:07 AM  Result Value Ref Range   Sodium 141 135 - 145 mmol/L   Potassium 3.8 3.5 - 5.1 mmol/L   Chloride 114 (H) 101 - 111 mmol/L   CO2 15 (L) 22 - 32 mmol/L   Glucose, Bld 153 (H) 65 - 99 mg/dL   BUN 28 (H) 6 - 20 mg/dL   Creatinine, Ser 2.24 (H) 0.44 - 1.00 mg/dL   Calcium 8.3 (L) 8.9 - 10.3 mg/dL   GFR calc non Af Amer 19 (L) >60 mL/min   GFR calc Af Amer 22 (L) >60 mL/min    Comment: (NOTE) The eGFR has been calculated using the CKD EPI equation. This  calculation has not been validated in all clinical situations. eGFR's persistently <60 mL/min signify possible Chronic Kidney Disease.    Anion gap 12 5 - 15  CBC with Differential/Platelet     Status: Abnormal   Collection Time: 11/27/14  7:07 AM  Result Value Ref Range   WBC 17.9 (H) 4.0 - 10.5 K/uL   RBC 3.58 (L) 3.87 - 5.11 MIL/uL   Hemoglobin 10.8 (L) 12.0 - 15.0 g/dL   HCT 33.7 (L) 36.0 - 46.0 %   MCV 94.1 78.0 - 100.0 fL   MCH 30.2 26.0 - 34.0 pg   MCHC 32.0 30.0 - 36.0 g/dL   RDW 19.2 (H) 11.5 - 15.5 %   Platelets 453 (H) 150 - 400 K/uL   Neutrophils Relative % 76 43 - 77 %   Neutro Abs 13.6 (H) 1.7 - 7.7 K/uL  Lymphocytes Relative 10 (L) 12 - 46 %   Lymphs Abs 1.8 0.7 - 4.0 K/uL   Monocytes Relative 8 3 - 12 %   Monocytes Absolute 1.5 (H) 0.1 - 1.0 K/uL   Eosinophils Relative 5 0 - 5 %   Eosinophils Absolute 0.9 (H) 0.0 - 0.7 K/uL   Basophils Relative 1 0 - 1 %   Basophils Absolute 0.2 (H) 0.0 - 0.1 K/uL  Glucose, capillary     Status: Abnormal   Collection Time: 11/27/14  8:10 AM  Result Value Ref Range   Glucose-Capillary 155 (H) 65 - 99 mg/dL   Comment 1 Notify RN     ABGS  Recent Labs  11/26/14 1635  PHART 7.355  PO2ART 65.9*  TCO2 12.4  HCO3 13.5*   CULTURES Recent Results (from the past 240 hour(s))  MRSA PCR Screening     Status: None   Collection Time: 11/26/14 12:00 PM  Result Value Ref Range Status   MRSA by PCR NEGATIVE NEGATIVE Final    Comment:        The GeneXpert MRSA Assay (FDA approved for NASAL specimens only), is one component of a comprehensive MRSA colonization surveillance program. It is not intended to diagnose MRSA infection nor to guide or monitor treatment for MRSA infections.    Studies/Results: US Renal  11/26/2014   CLINICAL DATA:  Acute renal insufficiency  EXAM: RENAL / URINARY TRACT ULTRASOUND COMPLETE  COMPARISON:  None.  FINDINGS: Right Kidney:  Length: 10.5 cm. Echogenicity and renal cortical thickness are  within normal limits. No mass, perinephric fluid, or hydronephrosis visualized. No sonographically demonstrable calculus or ureterectasis.  Left Kidney:  Length: 9.2 cm. Echogenicity and renal cortical thickness are within normal limits. No mass, perinephric fluid, or hydronephrosis visualized. No sonographically demonstrable calculus or ureterectasis.  Bladder:  Appears normal for degree of bladder distention.  IMPRESSION: Study within normal limits.   Electronically Signed   By: Lowella Grip III M.D.   On: 11/26/2014 11:02   Dg Chest Port 1 View  11/26/2014   CLINICAL DATA:  Leukocytosis.  Altered level of consciousness.  EXAM: PORTABLE CHEST - 1 VIEW  COMPARISON:  10/29/2014  FINDINGS: Tip of the left upper extremity PICC projects over the upper SVC/distal brachiocephalic vein. Progressive cardiomegaly. Development of vascular congestion and pulmonary edema. There are small bilateral pleural effusions and bibasilar opacities, likely atelectasis. Upper lung zones without focal consolidation. Degenerative change throughout spine. No pneumothorax.  IMPRESSION: 1. Findings consistent with CHF. 2. Tip of the left upper extremity PICC in the proximal SVC/distal brachiocephalic vein.   Electronically Signed   By: Jeb Levering M.D.   On: 11/26/2014 04:28    Medications:  Prior to Admission:  Prescriptions prior to admission  Medication Sig Dispense Refill Last Dose  . acetaminophen (TYLENOL) 650 MG CR tablet Take 650 mg by mouth every 6 (six) hours as needed for pain.   Past Week at Unknown time  . Amino Acids-Protein Hydrolys (FEEDING SUPPLEMENT, PRO-STAT SUGAR FREE 64,) LIQD Take 30 mLs by mouth 3 (three) times daily with meals.    11/25/2014 at Unknown time  . aspirin EC 81 MG tablet Take 1 tablet (81 mg total) by mouth daily. Start after 30 days of aspirin 328m 30 tablet 0 11/25/2014 at Unknown time  . cholecalciferol (VITAMIN D) 1000 UNITS tablet Take 1,000 Units by mouth daily.   11/25/2014 at  Unknown time  . colesevelam (WELCHOL) 625 MG tablet Take 1,875 mg by  mouth 2 (two) times daily with a meal.    11/25/2014 at Unknown time  . ezetimibe (ZETIA) 10 MG tablet Take 10 mg by mouth daily at 6 PM.    11/25/2014 at Unknown time  . hydrALAZINE (APRESOLINE) 25 MG tablet Take 25 mg by mouth daily. Daily if blood pressure is more than 160/90   Past Week at Unknown time  . HYDROcodone-acetaminophen (NORCO) 5-325 MG per tablet Take 1-2 tablets by mouth every 6 (six) hours as needed for moderate pain. (Patient taking differently: Take 1 tablet by mouth every 6 (six) hours as needed for moderate pain. ) 30 tablet 0 Past Week at Unknown time  . LORazepam (ATIVAN) 0.5 MG tablet Take 1 tablet (0.5 mg total) by mouth every 6 (six) hours as needed for anxiety. 30 tablet 0 Past Week at Unknown time  . Methylfol-Methylcob-Acetylcyst 6-2-600 MG TABS Take 1 tablet by mouth daily.   11/25/2014 at Unknown time  . pantoprazole (PROTONIX) 40 MG tablet Take 40 mg by mouth daily.   11/25/2014 at Unknown time  . polyvinyl alcohol (LIQUIFILM TEARS) 1.4 % ophthalmic solution Place 1 drop into both eyes 3 (three) times daily as needed for dry eyes.   Past Week at Unknown time  . rosuvastatin (CRESTOR) 20 MG tablet Take 20 mg by mouth daily.    11/25/2014 at Unknown time  . senna-docusate (SENOKOT-S) 8.6-50 MG per tablet Take 1 tablet by mouth 2 (two) times daily. Hold for loose stool   11/25/2014 at Unknown time  . acetaminophen (TYLENOL) 500 MG tablet Take 1,000 mg by mouth every 6 (six) hours as needed (pain).    09/26/2014 at Unknown time  . guaiFENesin (MUCINEX) 600 MG 12 hr tablet Take 600 mg by mouth 2 (two) times daily. 5 day course started 09/23/14   09/26/2014 at pm  . insulin aspart (NOVOLOG) 100 UNIT/ML injection Inject 1-9 Units into the skin 3 (three) times daily before meals. Per sliding scale: CBG 121-150 1 unit, 151-200 2 units; 201-250 3 units; 251-300 5 units, 301-351 7 units, 351-400 9 units;>401 call MD    09/26/2014 at pm  . ipratropium-albuterol (DUONEB) 0.5-2.5 (3) MG/3ML SOLN Take 3 mLs by nebulization every 6 (six) hours. 5 day course started 09/24/14   09/26/2014 at 0000  . lisinopril (PRINIVIL,ZESTRIL) 10 MG tablet Take 10 mg by mouth daily.     09/26/2014 at Unknown time  . omeprazole (PRILOSEC) 40 MG capsule Take 40 mg by mouth daily at 6 (six) AM.    09/26/2014 at Unknown time  . ondansetron (ZOFRAN) 4 MG tablet Take 1 tablet (4 mg total) by mouth every 8 (eight) hours as needed for nausea or vomiting. 20 tablet 0   . pioglitazone-glimepiride (DUETACT) 30-4 MG per tablet Take 1 tablet by mouth daily at 6 (six) AM.    09/26/2014 at Unknown time  . sitaGLIPtin (JANUVIA) 100 MG tablet Take 100 mg by mouth daily.     09/26/2014 at Unknown time   Scheduled: . aspirin EC  81 mg Oral Daily  . cefTRIAXone (ROCEPHIN)  IV  1 g Intravenous Q24H  . cholecalciferol  1,000 Units Oral Daily  . colesevelam  1,875 mg Oral BID WC  . ezetimibe  10 mg Oral q1800  . feeding supplement (PRO-STAT SUGAR FREE 64)  30 mL Oral BID  . pioglitazone  30 mg Oral Q breakfast   And  . glimepiride  4 mg Oral Q breakfast  . heparin  5,000 Units Subcutaneous  3 times per day  . insulin aspart  0-5 Units Subcutaneous QHS  . insulin aspart  0-9 Units Subcutaneous TID WC  . linagliptin  5 mg Oral Daily  . pantoprazole  40 mg Oral Daily  . rosuvastatin  20 mg Oral q1800  . senna-docusate  1 tablet Oral BID   Continuous: . 0.45 % NaCl with KCl 20 mEq / L 50 mL/hr at 11/26/14 1737   CHE:NIDPOEUMPNTIR, albuterol  Assesment: She was admitted with acute kidney injury. Her renal function is about the same as yesterday but some of this may be related to the fact that she did receive IV Lasix. I think she may be somewhat dehydrated and it's not clear that the respiratory problem that she's having his congestive heart failure. Echocardiogram was ordered but has not resulted yet.  She has confusion which has been ongoing but I think she  is acutely worse with a metabolic encephalopathy.  She has what appears to be a urinary tract infection but culture is not available yet.  She has leukocytosis presumably related to infection  She may be aspirating and will have speech evaluation but I'm not sure how much they can do with her current mental status Active Problems:   AKI (acute kidney injury)   HTN (hypertension), benign   Leukocytosis   Encephalopathy   DM type 2 (diabetes mellitus, type 2)   Essential hypertension   UTI (lower urinary tract infection)    Plan: Continue current IV fluids. Await echocardiogram. Request speech evaluation at least for bedside eval. Repeat chest x-ray today continue IV antibiotics but I will change from Rocephin to Zosyn for now because of concerns about aspiration    LOS: 1 day   Kaavya Puskarich L 11/27/2014, 8:23 AM

## 2014-11-27 NOTE — Clinical Documentation Improvement (Signed)
Supporting Information:  Per H&P After only one liter of IVF, she was clinically in CHF, with bilateral rales, and it was a change.IVF was stopped, and Lasix 40mg  IV given, and she was better. CHEST: Normal respiration, bilateral rales when I saw her.  Since she went into CHF so quickly, will obtain an ECHO as well  6/2 progress note: 5)CHF- CXR showed congestion  6/2 progress note: Resp: diminished breath sounds bilaterally Extremities: edema 2+ edema bilaterally Problem #4 CHF: Patient is still with significant edema  Echo: EF 35-45%6/2  Treatments Daily weights I&O  After study, please clarify acuity and type of CHF on progress note and discharge summary  . Document acuity --Acute --Chronic --Acute on Chronic  . Document type --Diastolic --Systolic --Combined systolic and diastolic --Other --Unable to determine  Thank You,  Lonie Rummell T. Luiz OchoaWilliams RN, MSN, MBA/MHA Clinical Documentation Specialist [email or web address] Office # [phone]

## 2014-11-28 DIAGNOSIS — E1122 Type 2 diabetes mellitus with diabetic chronic kidney disease: Secondary | ICD-10-CM

## 2014-11-28 DIAGNOSIS — N189 Chronic kidney disease, unspecified: Secondary | ICD-10-CM

## 2014-11-28 DIAGNOSIS — R410 Disorientation, unspecified: Secondary | ICD-10-CM

## 2014-11-28 DIAGNOSIS — L8990 Pressure ulcer of unspecified site, unspecified stage: Secondary | ICD-10-CM | POA: Insufficient documentation

## 2014-11-28 DIAGNOSIS — N179 Acute kidney failure, unspecified: Secondary | ICD-10-CM

## 2014-11-28 DIAGNOSIS — I5043 Acute on chronic combined systolic (congestive) and diastolic (congestive) heart failure: Secondary | ICD-10-CM

## 2014-11-28 DIAGNOSIS — N184 Chronic kidney disease, stage 4 (severe): Secondary | ICD-10-CM

## 2014-11-28 DIAGNOSIS — I10 Essential (primary) hypertension: Secondary | ICD-10-CM

## 2014-11-28 LAB — IRON AND TIBC
Iron: 46 ug/dL (ref 28–170)
Saturation Ratios: 25 % (ref 10.4–31.8)
TIBC: 188 ug/dL — ABNORMAL LOW (ref 250–450)
UIBC: 142 ug/dL

## 2014-11-28 LAB — GLUCOSE, CAPILLARY
GLUCOSE-CAPILLARY: 53 mg/dL — AB (ref 65–99)
Glucose-Capillary: 76 mg/dL (ref 65–99)
Glucose-Capillary: 91 mg/dL (ref 65–99)
Glucose-Capillary: 51 mg/dL — ABNORMAL LOW (ref 65–99)
Glucose-Capillary: 40 mg/dL — CL (ref 65–99)

## 2014-11-28 LAB — RENAL FUNCTION PANEL
ALBUMIN: 1.7 g/dL — AB (ref 3.5–5.0)
Anion gap: 12 (ref 5–15)
BUN: 27 mg/dL — ABNORMAL HIGH (ref 6–20)
CALCIUM: 7.8 mg/dL — AB (ref 8.9–10.3)
CHLORIDE: 111 mmol/L (ref 101–111)
CO2: 16 mmol/L — ABNORMAL LOW (ref 22–32)
Creatinine, Ser: 2.62 mg/dL — ABNORMAL HIGH (ref 0.44–1.00)
GFR calc Af Amer: 19 mL/min — ABNORMAL LOW (ref >60)
GFR calc non Af Amer: 16 mL/min — ABNORMAL LOW (ref >60)
Glucose, Bld: 46 mg/dL — ABNORMAL LOW (ref 65–99)
POTASSIUM: 3.9 mmol/L (ref 3.5–5.1)
Phosphorus: 3.3 mg/dL (ref 2.5–4.6)
Sodium: 139 mmol/L (ref 135–145)

## 2014-11-28 LAB — CREATININE, URINE, RANDOM: Creatinine, Urine: 25.9 mg/dL

## 2014-11-28 LAB — BRAIN NATRIURETIC PEPTIDE: B Natriuretic Peptide: 752.6 pg/mL — ABNORMAL HIGH (ref 0.0–100.0)

## 2014-11-28 LAB — URINALYSIS, ROUTINE W REFLEX MICROSCOPIC
Bilirubin Urine: NEGATIVE
Glucose, UA: NEGATIVE mg/dL
KETONES UR: NEGATIVE mg/dL
NITRITE: NEGATIVE
Protein, ur: 30 mg/dL — AB
Specific Gravity, Urine: 1.008 (ref 1.005–1.030)
Urobilinogen, UA: 0.2 mg/dL (ref 0.0–1.0)
pH: 5 (ref 5.0–8.0)

## 2014-11-28 LAB — URINE MICROSCOPIC-ADD ON

## 2014-11-28 LAB — VANCOMYCIN, RANDOM: Vancomycin Rm: 20 ug/mL

## 2014-11-28 LAB — FERRITIN: Ferritin: 242 ng/mL (ref 11–307)

## 2014-11-28 LAB — VITAMIN B12: Vitamin B-12: 4890 pg/mL — ABNORMAL HIGH (ref 180–914)

## 2014-11-28 LAB — SODIUM, URINE, RANDOM: SODIUM UR: 100 mmol/L

## 2014-11-28 MED ORDER — PIPERACILLIN-TAZOBACTAM 3.375 G IVPB
3.3750 g | Freq: Three times a day (TID) | INTRAVENOUS | Status: DC
  Administered 2014-11-28 – 2014-11-29 (×2): 3.375 g via INTRAVENOUS
  Filled 2014-11-28 (×3): qty 50

## 2014-11-28 MED ORDER — PANTOPRAZOLE SODIUM 40 MG IV SOLR
40.0000 mg | INTRAVENOUS | Status: DC
  Administered 2014-11-29 – 2014-11-30 (×3): 40 mg via INTRAVENOUS
  Filled 2014-11-28 (×4): qty 40

## 2014-11-28 MED ORDER — SODIUM BICARBONATE 650 MG PO TABS
650.0000 mg | ORAL_TABLET | Freq: Three times a day (TID) | ORAL | Status: DC
  Administered 2014-11-28 – 2014-12-10 (×23): 650 mg via ORAL
  Filled 2014-11-28 (×38): qty 1

## 2014-11-28 MED ORDER — DEXTROSE 50 % IV SOLN
INTRAVENOUS | Status: AC
  Administered 2014-11-28: 50 mL
  Filled 2014-11-28: qty 50

## 2014-11-28 NOTE — Clinical Social Work Note (Signed)
Patient from [facility] [place] and will return at discharge. CSW continuing to monitor patient's progress and will facilitate discharge back to facility once patient medically stable for discharge.  Genelle BalVanessa Tieasha Larsen, MSW, LCSW Licensed Clinical Social Worker Clinical Social Work Department [facility] [phone]

## 2014-11-28 NOTE — Consult Note (Signed)
CARDIOLOGY CONSULT NOTE   Patient ID: SYMIAH NOWOTNY MRN: [id number], DOB/AGE: [birth year]   Admit date: 11/26/2014 Date of Consult: 11/28/2014   Primary Physician: Fredirick Maudlin, MD Primary Cardiologist: New  Pt. Profile  81 yo female with PMH of HTN, DM, dementia, CKD and PAD present with acute on chronic renal insufficiency and elevated WBC. She was initially felt to be dry, went to flash pulm edema after given IVF in the [facility] ED. Transferred here for HF.   Problem List  Past Medical History  Diagnosis Date  . Diabetes mellitus   . Hypertension   . Osteopenia   . Peripheral vascular disease, unspecified   . GERD (gastroesophageal reflux disease)   . Arthritis   . Shortness of breath     exersion  . Pneumonia     child  . Anemia     hx  . Fall at home October 09, 2013  . DM (diabetes mellitus) type II uncontrolled, periph vascular disorder 09/17/2014  . Hepatitis     "a"    Past Surgical History  Procedure Laterality Date  . Appendectomy  1994  . Eye surgery Bilateral 08  . Lumbar laminectomy/decompression microdiscectomy N/A 07/09/2013    Procedure: LUMBAR LAMINECTOMY/DECOMPRESSION MICRODISCECTOMY LUMBAR TWO TO LUMBAR FIVE;  Surgeon: Karn Cassis, MD;  Location: [facility];  Service: Neurosurgery;  Laterality: N/A;  . Lumbar laminectomy/decompression microdiscectomy Left 09/17/2014    Procedure: Left Lumbar four-five Foraminotomy;  Surgeon: Hilda Lias, MD;  Location: [facility];  Service: Neurosurgery;  Laterality: Left;  Left L4-5 Foraminotomy  . Hip arthroplasty Left 09/28/2014    Procedure: HEMI ARTHROPLASTY;  Surgeon: Sheral Apley, MD;  Location: [facility] OR;  Service: Orthopedics;  Laterality: Left;     Allergies  No Known Allergies  HPI   81 yo female with PMH of HTN, DM, dementia, CKD and PAD who recently have laminectoy in March 2016 with subsequent fall and femoral neck fracture s/p ORIF. She has been living in SNF. She was found to have  leukocytosis and acute on chronic renal insufficiency and sent to [facility] for further evaluation. Patient does not appear to have significant past cardiac history, although her dementia and encephalopathy prevent me from obtaining more accurate past history. Therefore majority of the history of present illness has been obtained from medical record. Per patient, she denies any prior h/o swelling, orthopnea or PND. She never had any CP or prior h/o MI.   On presentation to [facility], her acute on chronic renal insufficiency was initially felt to be prerenal secondary to dehydration. While receiving IV fluid in the ED, she had spontaneous flash pulmonary edema. She subsequently received IV Lasix and was admitted to the internal medicine service. She was also noted to have a UTI. Initial chest x-ray was consistent with heart failure. She has a PICC line placed in the left upper extremity. Nephrology has also been consulted while she was at [facility]. Renal ultrasound was normal. There was also some concern of aspiration. Speech therapy was consulted. He was eventually transferred to [facility] on 11/27/2014 for further evaluation. Cardiology has been consulted for heart failure. Of note, she had echocardiogram done on 11/27/2014 which showed EF approximately 40% poor image quality, mild to moderately calcified aortic annulus, mild to moderate mitral regurg.   Inpatient Medications  . aspirin EC  81 mg Oral Daily  . cholecalciferol  1,000 Units Oral Daily  . colesevelam  1,875 mg Oral BID  WC  . ezetimibe  10 mg Oral q1800  . feeding supplement (PRO-STAT SUGAR FREE 64)  30 mL Oral BID  . furosemide  80 mg Intravenous BID  . heparin  5,000 Units Subcutaneous 3 times per day  . insulin aspart  0-5 Units Subcutaneous QHS  . insulin aspart  0-9 Units Subcutaneous TID WC  . [START ON 11/29/2014] pantoprazole (PROTONIX) IV  40 mg Intravenous Q24H  . piperacillin-tazobactam (ZOSYN)  IV  3.375 g Intravenous  Q8H  . rosuvastatin  20 mg Oral q1800  . senna-docusate  1 tablet Oral BID  . sodium bicarbonate  650 mg Oral TID    Family History Family History  Problem Relation Age of Onset  . Cancer Mother   . Hypertension Mother   . Heart disease Father     before age 79  . Heart attack Father   . Hyperlipidemia Son      Social History History   Social History  . Marital Status: Widowed    Spouse Name: N/A  . Number of Children: N/A  . Years of Education: N/A   Occupational History  . Not on file.   Social History Main Topics  . Smoking status: Former Smoker -- 0.30 packs/day for 1 years    Types: Cigarettes    Quit date: 01/06/1961  . Smokeless tobacco: Never Used  . Alcohol Use: No  . Drug Use: No  . Sexual Activity: No   Other Topics Concern  . Not on file   Social History Narrative     Review of Systems  General:  Alert and follow command, appears to be oriented on the surface however answer does not make much sense: states now is 1960s, president is [place], location is I don't know Confused. Unable to obtain majority of ROS. Denies any CP. Denies any current SOB. Does not remember how she got here.   Physical Exam  Blood pressure 116/59, pulse 76, temperature 97.9 F (36.6 C), temperature source Oral, resp. rate 24, height 5\' 5"  (1.651 m), weight 226 lb 6.4 oz (102.694 kg), SpO2 96 %.  General: Pleasant, NAD Psych: Normal affect. Neuro: Alert, but confused. Follow command.  HEENT: Normal  Neck: Supple without bruits. JVD difficult to assess with neck tissue Lungs:  Resp regular and unlabored. Only able to listen to L lung, poor air movement with expiratory wheezing and basilar rale. Pt states she cannot turn the other side for me to listen to her R lung.  Heart: RRR no s3, s4, or murmurs. Abdomen: Soft, non-tender, non-distended, BS + x 4.  Extremities: No clubbing, cyanosis. DP/PT/Radials 2+ and equal bilaterally. 2-3+ pitting and nonpitting edema.    Labs   Recent Labs  11/26/14 0913  CKTOTAL 25*  CKMB 5.7*   Lab Results  Component Value Date   WBC 17.9* 11/27/2014   HGB 10.8* 11/27/2014   HCT 33.7* 11/27/2014   MCV 94.1 11/27/2014   PLT 453* 11/27/2014    Recent Labs Lab 11/26/14 0228 11/27/14 0707  NA 143 141  K 3.3* 3.8  CL 117* 114*  CO2 16* 15*  BUN 28* 28*  CREATININE 2.14* 2.24*  CALCIUM 8.5* 8.3*  PROT 5.4*  --   BILITOT 0.6  --   ALKPHOS 107  --   ALT 11*  --   AST 13*  --   GLUCOSE 159* 153*    Radiology/Studies  Dg Chest 1 View  11/27/2014   CLINICAL DATA:  Cough  EXAM: CHEST  1 VIEW  COMPARISON:  11/26/2014  FINDINGS: Cardiomegaly again noted. Study is limited by poor inspiration. Persistent mild congestion/ interstitial edema. Left arm PICC line with tip at the junction of SVC with brachiocephalic vein is unchanged in position. There is no pneumothorax. Mild basilar atelectasis. Question small left pleural effusion.  IMPRESSION: Study is limited by poor inspiration. Persistent mild congestion/ interstitial edema. Left arm PICC line with tip at the junction of SVC with brachiocephalic vein is unchanged in position. There is no pneumothorax. Mild basilar atelectasis. Question small left pleural effusion.   Electronically Signed   By: Natasha Mead M.D.   On: 11/27/2014 08:51   Ct Head Wo Contrast  11/27/2014   CLINICAL DATA:  Worsening confusion, incontinence  EXAM: CT HEAD WITHOUT CONTRAST  TECHNIQUE: Contiguous axial images were obtained from the base of the skull through the vertex without intravenous contrast.  COMPARISON:  Brain MRI 10/16/2014  FINDINGS: No intracranial hemorrhage, mass effect or midline shift. Stable cerebral atrophy. Stable periventricular and patchy subcortical chronic white matter disease. Stable lacunar infarct in left corona radiata. No definite acute cortical infarction. No mass lesion is noted on this unenhanced scan. Paranasal sinuses and mastoid air cells are unremarkable.   IMPRESSION: No acute intracranial abnormality. Stable atrophy and chronic white matter disease. Stable lacunar infarct in left corona radiata. No definite acute cortical infarction.   Electronically Signed   By: Natasha Mead M.D.   On: 11/27/2014 08:53   US Renal  11/26/2014   CLINICAL DATA:  Acute renal insufficiency  EXAM: RENAL / URINARY TRACT ULTRASOUND COMPLETE  COMPARISON:  None.  FINDINGS: Right Kidney:  Length: 10.5 cm. Echogenicity and renal cortical thickness are within normal limits. No mass, perinephric fluid, or hydronephrosis visualized. No sonographically demonstrable calculus or ureterectasis.  Left Kidney:  Length: 9.2 cm. Echogenicity and renal cortical thickness are within normal limits. No mass, perinephric fluid, or hydronephrosis visualized. No sonographically demonstrable calculus or ureterectasis.  Bladder:  Appears normal for degree of bladder distention.  IMPRESSION: Study within normal limits.   Electronically Signed   By: Bretta Bang III M.D.   On: 11/26/2014 11:02   Dg Chest Port 1 View  11/26/2014   CLINICAL DATA:  Leukocytosis.  Altered level of consciousness.  EXAM: PORTABLE CHEST - 1 VIEW  COMPARISON:  10/29/2014  FINDINGS: Tip of the left upper extremity PICC projects over the upper SVC/distal brachiocephalic vein. Progressive cardiomegaly. Development of vascular congestion and pulmonary edema. There are small bilateral pleural effusions and bibasilar opacities, likely atelectasis. Upper lung zones without focal consolidation. Degenerative change throughout spine. No pneumothorax.  IMPRESSION: 1. Findings consistent with CHF. 2. Tip of the left upper extremity PICC in the proximal SVC/distal brachiocephalic vein.   Electronically Signed   By: Rubye Oaks M.D.   On: 11/26/2014 04:28    ECG  No new EKG  ASSESSMENT AND PLAN  1. Acute systolic HF  - Echo 11/27/2014 EF 40%, poor study, mild to moderate MR  - she has pretty significant dementia, would not be a good  cath candidate for ischemic eval  - still very much fluid overloaded, will continue IV diuresis.   - if renal function worsen with diuresis despite appear to be volume overloaded, may need RHC to assess cardiac index/output  - if mental status improve in the future, may assess with myoview  - obtain new EKG   2. SVT: bursts of SVT on telemetry   - may add bisoprolol in the  future (hesitant to use coreg with active wheezing)   3. Wheezing: per primary team  - speech assess for aspiration  4. UTI: per primary  5. HTN 6. DM 7. AMS with possible encephalopathy on top of dementia 8. Acute on chronic renal insufficiency 9. PAD    Signed, Azalee Course, PA-C 11/28/2014, 4:19 PM

## 2014-11-28 NOTE — Progress Notes (Signed)
SLP Cancellation Note  Patient Details Name: [name] Marciano MRN: [id number] DOB: 02/04/1931   Cancelled treatment:       Reason Eval/Treat Not Completed:  ([facility] )   Marcene Duoshelsea Sumney MA, CCC-SLP Acute Care Speech Language Pathologist      Kennieth RadSumney, Taffie Eckmann E 11/28/2014, 7:36 AM

## 2014-11-28 NOTE — Consult Note (Signed)
Reason for Consult: Acute Renal Failure Referring Physician: Manson Passey M.D. [facility])  HPI:  81 year old woman with past medical history significant for hypertension, diabetes mellitus without documented retinopathy/nephropathy, PVD and recent laminectomy (March 2016) complicated by a fall/femoral neck fracture status post ORIF after which he was sent to skilled nursing facility. They are, she was found to have worsening leukocytosis and worsening renal function after which she was sent to the emergency room. In between her labs in April and now, she was noted to have an elevated creatinine of 1.6 (unknown date) that was documented in her initial H&P.  Upon initial presentation, she was suspected to have a urinary tract infection (suspicious UA, so far culture negative) and also thought to be volume depleted with prerenal azotemia prompting intravenous fluid administration. This unfortunately tipped her over into decompensated heart failure after just 1 L and thereafter, the patient was started on diuresis. She was transferred here on request of her family for additional care. Prior to transfer, she was seen by nephrology up at [facility] who directed diuretics therapy and instructed discontinuation of her ACE inhibitor. I do not see any record of iodinated intravenous contrast exposure. I do not have records from her skilled nursing facility to see whether she was taking NSAIDs or not. Renal ultrasound negative hydronephrosis. The patient has very poor recall/knowledge of her medical history.  Past Medical History  Diagnosis Date  . Diabetes mellitus   . Hypertension   . Osteopenia   . Peripheral vascular disease, unspecified   . GERD (gastroesophageal reflux disease)   . Arthritis   . Shortness of breath     exersion  . Pneumonia     child  . Anemia     hx  . Fall at home October 09, 2013  . DM (diabetes mellitus) type II uncontrolled, periph vascular disorder 09/17/2014  . Hepatitis     "a"     Past Surgical History  Procedure Laterality Date  . Appendectomy  1994  . Eye surgery Bilateral 08  . Lumbar laminectomy/decompression microdiscectomy N/A 07/09/2013    Procedure: LUMBAR LAMINECTOMY/DECOMPRESSION MICRODISCECTOMY LUMBAR TWO TO LUMBAR FIVE;  Surgeon: Karn Cassis, MD;  Location: [facility];  Service: Neurosurgery;  Laterality: N/A;  . Lumbar laminectomy/decompression microdiscectomy Left 09/17/2014    Procedure: Left Lumbar four-five Foraminotomy;  Surgeon: Hilda Lias, MD;  Location: [facility];  Service: Neurosurgery;  Laterality: Left;  Left L4-5 Foraminotomy  . Hip arthroplasty Left 09/28/2014    Procedure: HEMI ARTHROPLASTY;  Surgeon: Sheral Apley, MD;  Location: [facility] OR;  Service: Orthopedics;  Laterality: Left;    Family History  Problem Relation Age of Onset  . Cancer Mother   . Hypertension Mother   . Heart disease Father     before age 35  . Heart attack Father   . Hyperlipidemia Son     Social History:  reports that she quit smoking about 53 years ago. Her smoking use included Cigarettes. She has a .3 pack-year smoking history. She has never used smokeless tobacco. She reports that she does not drink alcohol or use illicit drugs.  Allergies: No Known Allergies  Medications:  Scheduled: . aspirin EC  81 mg Oral Daily  . cholecalciferol  1,000 Units Oral Daily  . colesevelam  1,875 mg Oral BID WC  . ezetimibe  10 mg Oral q1800  . feeding supplement (PRO-STAT SUGAR FREE 64)  30 mL Oral BID  . furosemide  80 mg Intravenous BID  .  heparin  5,000 Units Subcutaneous 3 times per day  . insulin aspart  0-5 Units Subcutaneous QHS  . insulin aspart  0-9 Units Subcutaneous TID WC  . [START ON 11/29/2014] pantoprazole (PROTONIX) IV  40 mg Intravenous Q24H  . piperacillin-tazobactam (ZOSYN)  IV  3.375 g Intravenous Q8H  . rosuvastatin  20 mg Oral q1800  . senna-docusate  1 tablet Oral BID    BMP Latest Ref Rng 11/27/2014 11/26/2014 09/30/2014  Glucose 65  - 99 mg/dL 540(J) 811(B) 147(W)  BUN 6 - 20 mg/dL 29(F) 62(Z) 11  Creatinine 0.44 - 1.00 mg/dL 3.08(M) 5.78(I) 6.96  Sodium 135 - 145 mmol/L 141 143 138  Potassium 3.5 - 5.1 mmol/L 3.8 3.3(L) 3.6  Chloride 101 - 111 mmol/L 114(H) 117(H) 107  CO2 22 - 32 mmol/L 15(L) 16(L) 25  Calcium 8.9 - 10.3 mg/dL 8.3(L) 8.5(L) 8.1(L)   CBC Latest Ref Rng 11/27/2014 11/26/2014 10/02/2014  WBC 4.0 - 10.5 K/uL 17.9(H) 17.4(H) -  Hemoglobin 12.0 - 15.0 g/dL 10.8(L) 10.5(L) 8.2(L)  Hematocrit 36.0 - 46.0 % 33.7(L) 32.4(L) 25.5(L)  Platelets 150 - 400 K/uL 453(H) 483(H) -     Dg Chest 1 View  11/27/2014   CLINICAL DATA:  Cough  EXAM: CHEST  1 VIEW  COMPARISON:  11/26/2014  FINDINGS: Cardiomegaly again noted. Study is limited by poor inspiration. Persistent mild congestion/ interstitial edema. Left arm PICC line with tip at the junction of SVC with brachiocephalic vein is unchanged in position. There is no pneumothorax. Mild basilar atelectasis. Question small left pleural effusion.  IMPRESSION: Study is limited by poor inspiration. Persistent mild congestion/ interstitial edema. Left arm PICC line with tip at the junction of SVC with brachiocephalic vein is unchanged in position. There is no pneumothorax. Mild basilar atelectasis. Question small left pleural effusion.   Electronically Signed   By: Natasha Mead M.D.   On: 11/27/2014 08:51   Ct Head Wo Contrast  11/27/2014   CLINICAL DATA:  Worsening confusion, incontinence  EXAM: CT HEAD WITHOUT CONTRAST  TECHNIQUE: Contiguous axial images were obtained from the base of the skull through the vertex without intravenous contrast.  COMPARISON:  Brain MRI 10/16/2014  FINDINGS: No intracranial hemorrhage, mass effect or midline shift. Stable cerebral atrophy. Stable periventricular and patchy subcortical chronic white matter disease. Stable lacunar infarct in left corona radiata. No definite acute cortical infarction. No mass lesion is noted on this unenhanced scan. Paranasal  sinuses and mastoid air cells are unremarkable.  IMPRESSION: No acute intracranial abnormality. Stable atrophy and chronic white matter disease. Stable lacunar infarct in left corona radiata. No definite acute cortical infarction.   Electronically Signed   By: Natasha Mead M.D.   On: 11/27/2014 08:53    Review of Systems  Constitutional: Positive for malaise/fatigue. Negative for fever and chills.  HENT: Negative.   Eyes: Negative.   Respiratory: Negative.   Cardiovascular: Negative.   Gastrointestinal: Positive for nausea. Negative for vomiting, abdominal pain, diarrhea and constipation.  Genitourinary: Negative.   Musculoskeletal: Negative.   Skin: Negative.   Neurological: Positive for weakness.  Endo/Heme/Allergies: Negative.    Blood pressure 116/59, pulse 76, temperature 97.9 F (36.6 C), temperature source Oral, resp. rate 24, height  (1.651 m), weight 102.694 kg (226 lb 6.4 oz), SpO2 96 %. Physical Exam  Nursing note and vitals reviewed. Constitutional: She appears well-developed and well-nourished. No distress.  HENT:  Head: Normocephalic and atraumatic.  Nose: Nose normal.  Eyes: EOM are normal. Pupils are  equal, round, and reactive to light.  Neck: Normal range of motion. Neck supple. JVD present.  10 cm JVP  Cardiovascular: Normal rate, regular rhythm and normal heart sounds.  Exam reveals no friction rub.   No murmur heard. Respiratory: Effort normal. She has rales.  Fine bibasal rales  GI: Soft. Bowel sounds are normal. She exhibits no distension. There is no tenderness.  Musculoskeletal: She exhibits edema.  2-3+ bipedal pitting edema  Neurological: She is alert.  Oriented to place vaguely, poorly oriented to time/person  Skin: Skin is warm and dry. No rash noted. No erythema.    Assessment/Plan: 1. Acute renal failure: Currently nonoliguric with foley catheter in place. This appears to be multifactorial and currently likely from cardiorenal mechanisms with  CHF exacerbation. Need to check vancomycin levels in order to ensure that she does not have toxicity/associated ATN. Renal ultrasound negative for any obstruction and urinalysis not clearly supportive of GN but pointing towards interstitial nephritis with a numerous WBCs. Recheck urinalysis today to follow-up on status of WBCs-initial urine electrolytes noted, will recheck today. He does not have any acute electrolyte abnormalities or concerning uremic symptoms to prompt consideration for dialysis at this time. Continue close monitoring of input/output as well as daily labs to help guide further course of action. Continue to avoid nephrotoxic medications/procedures including iodinated contrast and NSAIDs. She appears to be hemodynamically stable at this time with good blood pressures. 2. Anion gap metabolic acidosis: Appears to be secondary to acute renal failure, start oral sodium bicarbonate for acid buffering at this time. 3. CHF exacerbation: Continue to optimize diuretics therapy at this time with daily weights, input/output measurements 4. Anemia: Hemoglobin currently stable, without overt bleed, continue to monitor. 5. Leukocytosis: No clear evidence of source of infection-may need repeat pancultures especially febrile. Suspected to be from demargination.   Appolonia Ackert K. 11/28/2014, 3:18 PM

## 2014-11-28 NOTE — Progress Notes (Signed)
Patient had difficulty swallowing pills whole in applesauce. Recommend crushing pills. Will continue to monitor. Bess KindsGWALTNEY, July Linam B, RN

## 2014-11-28 NOTE — Evaluation (Signed)
Clinical/Bedside Swallow Evaluation Patient Details  Name: Tina [name] MRN: [id number] Date of Birth: [birth year]  Today's Date: 11/28/2014 Time: SLP Start Time (ACUTE ONLY): 1536 SLP Stop Time (ACUTE ONLY): 1554 SLP Time Calculation (min) (ACUTE ONLY): 18 min  Past Medical History:  Past Medical History  Diagnosis Date  . Diabetes mellitus   . Hypertension   . Osteopenia   . Peripheral vascular disease, unspecified   . GERD (gastroesophageal reflux disease)   . Arthritis   . Shortness of breath     exersion  . Pneumonia     child  . Anemia     hx  . Fall at home October 09, 2013  . DM (diabetes mellitus) type II uncontrolled, periph vascular disorder 09/17/2014  . Hepatitis     "a"   Past Surgical History:  Past Surgical History  Procedure Laterality Date  . Appendectomy  1994  . Eye surgery Bilateral 08  . Lumbar laminectomy/decompression microdiscectomy N/A 07/09/2013    Procedure: LUMBAR LAMINECTOMY/DECOMPRESSION MICRODISCECTOMY LUMBAR TWO TO LUMBAR FIVE;  Surgeon: Karn Cassis, MD;  Location: [facility];  Service: Neurosurgery;  Laterality: N/A;  . Lumbar laminectomy/decompression microdiscectomy Left 09/17/2014    Procedure: Left Lumbar four-five Foraminotomy;  Surgeon: Hilda Lias, MD;  Location: [facility];  Service: Neurosurgery;  Laterality: Left;  Left L4-5 Foraminotomy  . Hip arthroplasty Left 09/28/2014    Procedure: HEMI ARTHROPLASTY;  Surgeon: Sheral Apley, MD;  Location: [facility];  Service: Orthopedics;  Laterality: Left;   HPI:  81 year old female with past medical history of dementia, diabetes, hypertension, neurogenic claudication, s/p laminectomy by Dr Jeral Fruit in March 2016, subsequently fell and had left subcapital femoral neck fracture, has had ORIF (done by Dr. Margarita Rana) and then sent to SNF. While there, she was found to have leukocytosis and increasing creatinine. Pt was observed coughing with PO intake.   Assessment / Plan /  Recommendation Clinical Impression  Pt requires frequent encouragement for participation in PO trials, with immediate coughing noted s/p sips of thin liquids. While consumption was very limited, she appeared to have improved tolerance with nectar thick liquids and solids. Pt had good awareness of mild oral residuals, requesting liquid wash to clear. Recommend Dys 3 diet and nectar thick liquids with SLP f/u for tolerance and readiness to advance.    Aspiration Risk  Mild    Diet Recommendation Dysphagia 3 (Mech soft);Nectar   Medication Administration: Whole meds with puree Compensations: Slow rate;Small sips/bites    Other  Recommendations Oral Care Recommendations: Oral care BID Other Recommendations: Order thickener from pharmacy;Prohibited food (jello, ice cream, thin soups);Remove water pitcher   Follow Up Recommendations       Frequency and Duration min 2x/week  2 weeks   Pertinent Vitals/Pain n/a    SLP Swallow Goals     Swallow Study Prior Functional Status       General Other Pertinent Information: 81 year old female with past medical history of dementia, diabetes, hypertension, neurogenic claudication, s/p laminectomy by Dr Jeral Fruit in March 2016, subsequently fell and had left subcapital femoral neck fracture, has had ORIF (done by Dr. Margarita Rana) and then sent to SNF. While there, she was found to have leukocytosis and increasing creatinine. Pt was observed coughing with PO intake. Type of Study: Bedside swallow evaluation Previous Swallow Assessment: none in chart Diet Prior to this Study: Dysphagia 3 (soft);Thin liquids Temperature Spikes Noted: No Respiratory Status: Room air History of Recent Intubation:  No Behavior/Cognition: Alert;Requires cueing Self-Feeding Abilities: Able to feed self Patient Positioning: Upright in bed Baseline Vocal Quality: Normal    Oral/Motor/Sensory Function Overall Oral Motor/Sensory Function: Appears within functional limits  for tasks assessed   Ice Chips Ice chips: Not tested   Thin Liquid Thin Liquid: Impaired Presentation: Cup;Self Fed;Straw Pharyngeal  Phase Impairments: Suspected delayed Swallow;Cough - Immediate    Nectar Thick Nectar Thick Liquid: Impaired Presentation: Self Fed;Straw;Cup Pharyngeal Phase Impairments: Suspected delayed Swallow   Honey Thick Honey Thick Liquid: Not tested   Puree Puree: Within functional limits Presentation: Self Fed;Spoon   Solid    Solid: Impaired Presentation: Self Fed Oral Phase Impairments: Impaired anterior to posterior transit Oral Phase Functional Implications: Oral residue      Maxcine HamLaura Paiewonsky, M.A. CCC-SLP [phone]  Maxcine Hamaiewonsky, Geneieve Duell 11/28/2014,3:59 PM

## 2014-11-28 NOTE — Progress Notes (Signed)
     Subjective:  S/P L hip hemiarthroplasty on 09/28/14. Patient reports pain as mild.  Resting comfortably in bed.   Objective:   VITALS:   Filed Vitals:   11/27/14 1448 11/27/14 1944 11/28/14 0500 11/28/14 0900  BP: 132/79 134/74 125/62 116/59  Pulse: 109 92 80 76  Temp: 97.6 F (36.4 C) 97.5 F (36.4 C) 97.5 F (36.4 C) 97.9 F (36.6 C)  TempSrc: Oral Oral Oral Oral  Resp: 20 20 28 24   Height:  5\' 5"  (1.651 m)    Weight:  102.694 kg (226 lb 6.4 oz)    SpO2: 94% 96% 95% 96%    ABD soft Neurovascular intact Sensation intact distally Intact pulses distally Dorsiflexion/Plantar flexion intact Incision: 2cm healing wound of incision. No active purulent drainage.    Lab Results  Component Value Date   WBC 17.9* 11/27/2014   HGB 10.8* 11/27/2014   HCT 33.7* 11/27/2014   MCV 94.1 11/27/2014   PLT 453* 11/27/2014   BMET    Component Value Date/Time   NA 141 11/27/2014 0707   K 3.8 11/27/2014 0707   CL 114* 11/27/2014 0707   CO2 15* 11/27/2014 0707   GLUCOSE 153* 11/27/2014 0707   BUN 28* 11/27/2014 0707   CREATININE 2.24* 11/27/2014 0707   CALCIUM 8.3* 11/27/2014 0707   GFRNONAA 19* 11/27/2014 0707   GFRAA 22* 11/27/2014 0707     Assessment/Plan:     Principal Problem:   AKI (acute kidney injury) Active Problems:   FTT (failure to thrive) in adult   HTN (hypertension), benign   Leukocytosis   Encephalopathy   DM type 2 (diabetes mellitus, type 2)   Essential hypertension   UTI (lower urinary tract infection)   Pressure ulcer   Up with therapy WBAT in the LLE Incision is healing in comparison to rechecks in the office. The patient had an incisional vac for the past month to help with healing.  She was also on vancomysin that had to be discontinued prior to admission due to abnormal kidney panel. Will have nursing continue a wet to dry dressing daily on the small incisional wound.   Tina [name] 11/28/2014, 2:50 PM Cell [phone]  [phone]

## 2014-11-28 NOTE — Progress Notes (Signed)
Inpatient Diabetes Program Recommendations  AACE/ADA: New Consensus Statement on Inpatient Glycemic Control (2013)  Target Ranges:  Prepandial:   less than 140 mg/dL      Peak postprandial:   less than 180 mg/dL (1-2 hours)      Critically ill patients:  140 - 180 mg/dL     Results for Penelope CoopCURRAN, Sharlet A (MRN [id number]) as of 11/28/2014 09:40  Ref. Range 11/28/2014 08:00 11/28/2014 08:56  Glucose-Capillary Latest Ref Range: 65-99 mg/dL 53 (L) 76     Admit with: UTI/ Acute on Chronic Kidney Disease  History: DM, HTN, Dementia  Home DM Meds: Actos 30 mg daily      Amaryl 4 mg daily      Januvia 100 mg daily      Novolog SSI  Current DM Orders: Actos 30 mg daily            Amaryl 4 mg daily            Januvia 100 mg daily            Novolog Sensitive SSI tid ac + HS   **Patient with Hypoglycemia this AM.    **Not eating well- Very Poor PO intake as of right now.    MD- Please consider discontinuation of oral DM medications (Amarly, Actos, and Tradjenta) until patient's PO intake improves.  Would manage with Novolog SSI along until PO intake improves.     Will follow Ambrose FinlandJeannine Johnston Devaughn Savant RN, MSN, CDE Diabetes Coordinator Inpatient Diabetes Program Team Pager: [phone] (8a-5p)

## 2014-11-28 NOTE — Evaluation (Signed)
Physical Therapy Evaluation Patient Details Name: Tina [name] MRN: [id number] DOB: 08/03/1930 Today's Date: 11/28/2014   History of Present Illness  81 year old female with past medical history of dementia, diabetes, hypertension, neurogenic claudication, s/p laminectomy by Dr Jeral FruitBotero in March 2016, subsequently fell and had left subcapital femoral neck fracture, has had ORIF (done by Dr. Margarita Ranaimothy Murphy) and then sent to SNF. While there, she was found to have leukocytosis and increasing creatinine. Pt was observed coughing with PO intake.   Clinical Impression  Pt admitted with above diagnosis. Pt currently with functional limitations due to the deficits listed below (see PT Problem List). [facility]  Needs max encouragement to participate.  Pt will benefit from skilled PT to increase their independence and safety with mobility to allow discharge to the venue listed below.      Follow Up Recommendations SNF;Supervision/Assistance - 24 hour    Equipment Recommendations  Other (comment) (TBA)    Recommendations for Other Services       Precautions / Restrictions Precautions Precautions: Fall;Posterior Hip Restrictions Weight Bearing Restrictions: Yes LLE Weight Bearing: Weight bearing as tolerated      Mobility  Bed Mobility Overal bed mobility: Needs Assistance;+2 for physical assistance Bed Mobility: Rolling;Sidelying to Sit Rolling: +2 for physical assistance;Max assist Sidelying to sit: Max assist;+2 for physical assistance       General bed mobility comments: Pt needed assist for LEs and elevation of trunk.  Used pad to get pts hip to EOB.   Transfers                    Ambulation/Gait                Stairs            Wheelchair Mobility    Modified Rankin (Stroke Patients Only)       Balance Overall balance assessment: Needs assistance;History of Falls Sitting-balance support: Bilateral upper extremity supported;Feet  supported Sitting balance-Leahy Scale: Zero Sitting balance - Comments: Pt sat EOB 10 minutes.  Poor postural stability with heavy posterior lean.  Noted BM on bed sheet once pt sat up.  Encouraged pt to sit so PT could wash back and change linens.  Pt not very motivated to sit and kept tryingto lie down.  Got linens changed and laid pt down.  Stool had run onto floor as pt had BM while sitting.  Cleaned pt and bed and changed all linens.  Got pt scooted up in bed and ST came in to perform swallow eval.   Postural control: Posterior lean                                   Pertinent Vitals/Pain Pain Assessment: Faces Faces Pain Scale: Hurts little more Pain Location: "all over" per pt Pain Descriptors / Indicators: Aching;Grimacing;Guarding Pain Intervention(s): Limited activity within patient's tolerance;Monitored during session;Repositioned  VSS    Home Living Family/patient expects to be discharged to:: Skilled nursing facility                 Additional Comments: Pt's AMS limited her ability to answer all home living questions (h/o Alzheimer's)    Prior Function Level of Independence: Needs assistance   Gait / Transfers Assistance Needed: was receiving therapy at NH but pt could not report functional status  ADL's / Homemaking Assistance [facility]        Hand Dominance   Dominant Hand: Right    Extremity/Trunk Assessment   Upper Extremity Assessment: Defer to OT evaluation           Lower Extremity Assessment: RLE deficits/detail;LLE deficits/detail RLE Deficits / Details: grossly 2-/5 LLE Deficits / Details: grossly 2-/5  Cervical / Trunk Assessment: Kyphotic  Communication   Communication: No difficulties  Cognition Arousal/Alertness: Awake/alert Behavior During Therapy: Anxious;Impulsive (cursing) Overall Cognitive Status: History of cognitive impairments - at baseline       Memory: Decreased short-term  memory;Decreased recall of precautions              General Comments      Exercises        Assessment/Plan    PT Assessment Patient needs continued PT services  PT Diagnosis Generalized weakness;Acute pain   PT Problem List Decreased activity tolerance;Decreased balance;Decreased mobility;Decreased knowledge of use of DME;Decreased safety awareness;Decreased knowledge of precautions;Pain;Decreased strength;Decreased range of motion  PT Treatment Interventions DME instruction;Gait training;Functional mobility training;Therapeutic activities;Therapeutic exercise;Balance training;Patient/family education   PT Goals (Current goals can be found in the Care Plan section) Acute Rehab PT Goals Patient Stated Goal: unable to state PT Goal Formulation: Patient unable to participate in goal setting Time For Goal Achievement: 12/12/14 Potential to Achieve Goals: Fair    Frequency Min 2X/week   Barriers to discharge Decreased caregiver support      Co-evaluation               End of Session Equipment Utilized During Treatment: Gait belt Activity Tolerance: Patient limited by fatigue;Patient limited by pain Patient left: in bed;with call bell/phone within reach;with bed alarm set (with ST in room) Nurse Communication: Mobility status;Need for lift equipment         Time: 4098-1191 PT Time Calculation (min) (ACUTE ONLY): 31 min   Charges:   PT Evaluation $Initial PT Evaluation Tier I: 1 Procedure PT Treatments $Therapeutic Activity: 8-22 mins   PT G CodesBerline Lopes 12-02-2014, 4:53 PM Martia Dalby,PT Acute Rehabilitation [phone] (pager)

## 2014-11-28 NOTE — Progress Notes (Addendum)
Patient ID: Tina [name], female   DOB: 09/07/1930, 81 y.o.   MRN: [id number] [facility] PROGRESS NOTE  Tina [name] UYQ:[id number] DOB: 04/29/1931 DOA: 11/26/2014 PCP: Fredirick MaudlinHAWKINS,EDWARD L, MD  Brief narrative:    81 year old female with past medical history of dementia, diabetes, hypertension, neurogenic claudication, s/p laminectomy by Dr Jeral FruitBotero in March 2016, subsequently fell and had left subcapital femoral neck fracture, has had ORIF (done by Dr. Margarita Ranaimothy Murphy) and then sent to SNF. While there, she was found to have leukocytosis and increasing creatinine.  [facility] was [facility] to [facility] and was under Dr.Hawkins care but per family request [facility]. She was found to have new elevation in creatinine, 2.14 on admission (back in 09/2014 her Cr was WNL but in 08/2014 it was 1.38). She was also found to have acute systolic and diastolic CHF and was placed on lasix 80 mg IV Q 12 hours. She has been seen by renal while in [facility]. She was initially on rocephin but this was changed to zosyn while in [facility] since there was also a concern that she may be aspirating. SLP consulted.  Barrier to discharge: worsening renal failure for which reason renal was consulted. Also needs SLP evaluation to see if current diet is adequate for her. She has significant cough this am with swallowing. She is on zosyn for possible aspiration. Also, she is receiving IV lasix for acute CHF. Cardiology consulted.   Assessment/Plan:    Principal Problem: Acute encephalopathy - Likely combination of dementia, UTI and possible aspiration  - Pt oriented to place and person this am - She will need PT eval for safe discharge plan  Active Problems:   Acute on chronic kidney disease, stage 4 / Metabolic acidosis  - Likely from UTI or from lasix. Cr in 08/2014 the highest 1.38 and on this admission 2.14. - CO2 of 15, possibly from ARF. May need sodium bicarb supplementation.  - Appreciate renal consult  and recommendations - She received IV fluids on admission but she has LE edema and evidence of CHF so IV fluids stopped, 11/28/2014.    Acute on chronic combined systolic and diastolic CHF - 2 D ECHO on this admission showed EF of 35-40%. In addition, she has LE pitting edema at least +1-2. - The following is the weight trend since 11/26/2014: 101.5 kg --> 100.2 kg --> 102.6 kg - Cardio consulted - Daily weight and strict intake and output - Continue lasix 80 mg IV BID - Replete electrolytes       HTN (hypertension), benign - BP 116/59 - Currently receiving lasix 80 mg IV BID    Hypokalemia - Due to lasix - Supplemented     Aspiration pneumonitis / UTI / Leukocytosis - Leukocytosis likely due to UTI and possible aspiration pneumonia - Initially on rocephin but changed to zosyn to cover for both, UTI and aspiration pneumonitis - Aspiration precaution - Obtain SLP evaluation     DM type 2 (diabetes mellitus, type 2) with renal manifestations - A1c in 08/2014 5.8 indicating good glycemic control - D/C'ed PO antihyperglycemics while aspiration precaution - Use SSI - DM coordinator consulted   Anemia of chronic kidney disease - Hemoglobin 10.8 - Stable    Dyslipidemia - Continue crestor    Pressure ulcer stage 1 - Intact skin with non-blanchable redness  - Care per floor RN   DVT Prophylaxis  - Heparin subQ ordered    Code Status: Full.  Family Communication:  plan of care discussed with the patient Disposition Plan: Needs PT eval for safe discharge plan. Also needs SLP evaluation.   IV access:  Peripheral IV  Procedures and diagnostic studies:    TTE 11/27/14 -  EF 35 -40%, indeterminate diastolic function   Dg Chest 1 View 11/27/2014 Study is limited by poor inspiration. Persistent mild congestion/ interstitial edema. Left arm PICC line with tip at the junction of SVC with brachiocephalic vein is unchanged in position. There is no pneumothorax. Mild basilar atelectasis.  Question small left pleural effusion.     Ct Head Wo Contrast 11/27/2014   No acute intracranial abnormality. Stable atrophy and chronic white matter disease. Stable lacunar infarct in left corona radiata. No definite acute cortical infarction.     US Renal 11/26/2014   Study within normal limits.    Dg Chest Port 1 View 11/26/2014  1. Findings consistent with CHF. 2. Tip of the left upper extremity PICC in the proximal SVC/distal brachiocephalic vein.    Medical Consultants:  Nephrology - Dr. Allena Katz Cardiology  Other Consultants:  SLP Physical therapy Diabetic coordinator  IAnti-Infectives:   Zosyn 11/26/2014 -->    Manson Passey, MD  [facility] Pager [phone]  Time spent in minutes: 25 minutes  If 7PM-7AM, please contact night-coverage [email or web address] Password [email or web address] 11/28/2014, 2:23 PM   LOS: 2 days    HPI/Subjective: No acute overnight events. Patient reports no nausea. She is eating the breakfast and has coughing with eating.   Objective: Filed Vitals:   11/27/14 1448 11/27/14 1944 11/28/14 0500 11/28/14 0900  BP: 132/79 134/74 125/62 116/59  Pulse: 109 92 80 76  Temp: 97.6 F (36.4 C) 97.5 F (36.4 C) 97.5 F (36.4 C) 97.9 F (36.6 C)  TempSrc: Oral Oral Oral Oral  Resp: Height:   (1.651 m)    Weight:  102.694 kg (226 lb 6.4 oz)    SpO2: 94% 96% 95% 96%    Intake/Output Summary (Last 24 hours) at 11/28/14 1423 Last data filed at 11/28/14 1046  Gross per 24 hour  Intake    240 ml  Output     75 ml  Net    165 ml    Exam:   General:  Pt is alert, follows commands appropriately, not in acute distress  Cardiovascular: Regular rate and rhythm, S1/S2 (+)  Respiratory: Clear to auscultation bilaterally, no wheezing, no crackles, no rhonchi  Abdomen: Soft, non tender, non distended, bowel sounds present  Extremities: +1-2 LE pitting edema, pulses DP and PT palpable bilaterally  Neuro: Grossly nonfocal  Data Reviewed: Basic  Metabolic Panel:  Recent Labs Lab 11/26/14 0228 11/27/14 0707  NA 143 141  K 3.3* 3.8  CL 117* 114*  CO2 16* 15*  GLUCOSE 159* 153*  BUN 28* 28*  CREATININE 2.14* 2.24*  CALCIUM 8.5* 8.3*  MG 1.6*  --   PHOS 3.5  --    Liver Function Tests:  Recent Labs Lab 11/26/14 0228  AST 13*  ALT 11*  ALKPHOS 107  BILITOT 0.6  PROT 5.4*  ALBUMIN 2.1*   No results for input(s): LIPASE, AMYLASE in the last 168 hours. No results for input(s): AMMONIA in the last 168 hours. CBC:  Recent Labs Lab 11/26/14 0228 11/27/14 0707  WBC 17.4* 17.9*  NEUTROABS 14.2* 13.6*  HGB 10.5* 10.8*  HCT 32.4* 33.7*  MCV 92.0 94.1  PLT 483* 453*   Cardiac Enzymes:  Recent Labs Lab 11/26/14 0913  CKTOTAL 25*  CKMB 5.7*   BNP: Invalid input(s): POCBNP CBG:  Recent Labs Lab 11/27/14 1659 11/27/14 1941 11/28/14 0800 11/28/14 0856 11/28/14 1136  GLUCAP 88 84 53* 76 91    Recent Results (from the past 240 hour(s))  Urine culture     Status: None   Collection Time: 11/26/14  2:00 AM  Result Value Ref Range Status   Specimen Description URINE, CATHETERIZED  Final   Special Requests NONE  Final   Culture NO GROWTH  Final   Report Status 11/27/2014 FINAL  Final  MRSA PCR Screening     Status: None   Collection Time: 11/26/14 12:00 PM  Result Value Ref Range Status   MRSA by PCR NEGATIVE NEGATIVE Final     Scheduled Meds: . aspirin EC  81 mg Oral Daily  . cholecalciferol  1,000 Units Oral Daily  . colesevelam  1,875 mg Oral BID WC  . ezetimibe  10 mg Oral q1800  . feeding supplement   30 mL Oral BID  . furosemide  80 mg Intravenous BID  . heparin  5,000 Units Subcutaneous 3 times per day  . insulin aspart  0-5 Units Subcutaneous QHS  . insulin aspart  0-9 Units Subcutaneous TID WC  . [START ON 11/29/2014] pantoprazole (PROTONIX) IV  40 mg Intravenous Q24H  . piperacillin-tazobactam (ZOSYN)  IV  3.375 g Intravenous Q8H  . rosuvastatin  20 mg Oral q1800  . senna-docusate  1  tablet Oral BID   Continuous Infusions: . 0.45 % NaCl with KCl 20 mEq / L 50 mL/hr at 11/27/14 1752

## 2014-11-29 DIAGNOSIS — I5021 Acute systolic (congestive) heart failure: Secondary | ICD-10-CM

## 2014-11-29 LAB — GLUCOSE, CAPILLARY
GLUCOSE-CAPILLARY: 37 mg/dL — AB (ref 65–99)
GLUCOSE-CAPILLARY: 119 mg/dL — AB (ref 65–99)
GLUCOSE-CAPILLARY: 94 mg/dL (ref 65–99)
GLUCOSE-CAPILLARY: 41 mg/dL — AB (ref 65–99)
GLUCOSE-CAPILLARY: 33 mg/dL — AB (ref 65–99)
Glucose-Capillary: 140 mg/dL — ABNORMAL HIGH (ref 65–99)
Glucose-Capillary: 52 mg/dL — ABNORMAL LOW (ref 65–99)
Glucose-Capillary: 128 mg/dL — ABNORMAL HIGH (ref 65–99)
Glucose-Capillary: 115 mg/dL — ABNORMAL HIGH (ref 65–99)
Glucose-Capillary: 128 mg/dL — ABNORMAL HIGH (ref 65–99)

## 2014-11-29 LAB — BASIC METABOLIC PANEL
Anion gap: 13 (ref 5–15)
BUN: 29 mg/dL — ABNORMAL HIGH (ref 6–20)
CALCIUM: 7.8 mg/dL — AB (ref 8.9–10.3)
CO2: 16 mmol/L — ABNORMAL LOW (ref 22–32)
CREATININE: 2.71 mg/dL — AB (ref 0.44–1.00)
Chloride: 111 mmol/L (ref 101–111)
GFR calc Af Amer: 18 mL/min — ABNORMAL LOW (ref >60)
GFR calc non Af Amer: 15 mL/min — ABNORMAL LOW (ref >60)
Glucose, Bld: 34 mg/dL — CL (ref 65–99)
Potassium: 3.4 mmol/L — ABNORMAL LOW (ref 3.5–5.1)
Sodium: 140 mmol/L (ref 135–145)

## 2014-11-29 LAB — GLUCOSE, RANDOM: GLUCOSE: 63 mg/dL — AB (ref 65–99)

## 2014-11-29 LAB — UREA NITROGEN, URINE: Urea Nitrogen, Ur: 112 mg/dL

## 2014-11-29 MED ORDER — POTASSIUM CHLORIDE CRYS ER 20 MEQ PO TBCR
40.0000 meq | EXTENDED_RELEASE_TABLET | Freq: Once | ORAL | Status: AC
  Administered 2014-11-29: 40 meq via ORAL
  Filled 2014-11-29: qty 2

## 2014-11-29 MED ORDER — DEXTROSE 50 % IV SOLN
INTRAVENOUS | Status: AC
  Administered 2014-11-29: 50 mL
  Filled 2014-11-29: qty 50

## 2014-11-29 MED ORDER — RESOURCE THICKENUP CLEAR PO POWD
ORAL | Status: DC | PRN
  Filled 2014-11-29: qty 125

## 2014-11-29 MED ORDER — SODIUM CHLORIDE 0.9 % IJ SOLN
10.0000 mL | INTRAMUSCULAR | Status: DC | PRN
  Administered 2014-11-29: 20 mL
  Administered 2014-12-02: 10 mL
  Filled 2014-11-29: qty 40

## 2014-11-29 MED ORDER — PIPERACILLIN-TAZOBACTAM IN DEX 2-0.25 GM/50ML IV SOLN
2.2500 g | Freq: Four times a day (QID) | INTRAVENOUS | Status: DC
  Administered 2014-11-29 – 2014-12-01 (×8): 2.25 g via INTRAVENOUS
  Filled 2014-11-29 (×12): qty 50

## 2014-11-29 MED ORDER — SODIUM CHLORIDE 0.9 % IV SOLN
INTRAVENOUS | Status: DC
  Administered 2014-11-29: 09:00:00 via INTRAVENOUS

## 2014-11-29 MED ORDER — DEXTROSE 50 % IV SOLN
INTRAVENOUS | Status: AC
  Filled 2014-11-29: qty 50

## 2014-11-29 MED ORDER — DEXTROSE 10 % IV SOLN
INTRAVENOUS | Status: DC
  Administered 2014-11-29 – 2014-12-01 (×2): via INTRAVENOUS

## 2014-11-29 MED ORDER — DEXTROSE 50 % IV SOLN
50.0000 mL | Freq: Once | INTRAVENOUS | Status: AC
  Administered 2014-11-29: 50 mL via INTRAVENOUS
  Filled 2014-11-29: qty 50

## 2014-11-29 MED ORDER — METOPROLOL SUCCINATE 12.5 MG HALF TABLET
12.5000 mg | ORAL_TABLET | Freq: Every day | ORAL | Status: DC
  Administered 2014-11-29 – 2014-12-10 (×10): 12.5 mg via ORAL
  Filled 2014-11-29 (×12): qty 1

## 2014-11-29 MED ORDER — DEXTROSE 50 % IV SOLN
50.0000 mL | Freq: Once | INTRAVENOUS | Status: AC
  Administered 2014-11-29: 50 mL via INTRAVENOUS

## 2014-11-29 NOTE — Progress Notes (Signed)
Hypoglycemic Event  CBG: 33  Treatment: D50 IV 50 mL  Symptoms: None  Follow-up CBG: Time:2215 CBG Result:128  Possible Reasons for Event: Inadequate meal intake  Comments/MD notified: M. Lynch PA made aware.  No new orders at this time.  Will continue to monitor.    Tina [name], Tina [name]  Remember to initiate Hypoglycemia Order Set & complete

## 2014-11-29 NOTE — Progress Notes (Signed)
Patient ID: Tina [name], female   DOB: 03/03/1931, 81 y.o.   MRN: [id number]  [facility] Progress Note   Assessment/ Plan:   1. Acute renal failure: Currently nonoliguric with foley catheter in place. This appears to be multifactorial and currently likely from cardiorenal mechanisms with CHF exacerbation. In reviewing her labs-she may indeed have evolved into ATN (ischemic), random vancomycin levels were nontoxic. Urinalysis yesterday appears to be indicative of Foley associated trauma with multiple RBCs and WBCs. No changes in management at this time, continue efforts at diuretic therapy given her overall volume status and continue to monitor daily labs anticipating renal recovery soon after what appears to be a plateau phase of ATN. 2. Anion gap metabolic acidosis: Appears to be secondary to acute renal failure, started on sodium bicarbonate yesterday-we'll continue to follow. 3. CHF exacerbation: Continue to optimize diuretics therapy at this time with daily weights, input/output measurements 4. Anemia: Hemoglobin currently stable, without overt bleed, continue to monitor. 5. Leukocytosis: No clear evidence of source of infection-may need repeat pancultures especially febrile. Suspected to be from demargination.  Subjective:   Reports to be feeling slightly better-still feels weak and having problems with swallowing    Objective:   BP 114/54 mmHg  Pulse 98  Temp(Src) 97.5 F (36.4 C) (Oral)  Resp 28  Ht 5\' 5"  (1.651 m)  Wt 102.684 kg (226 lb 6 oz)  BMI 37.67 kg/m2  SpO2 99%  Intake/Output Summary (Last 24 hours) at 11/29/14 1129 Last data filed at 11/29/14 0900  Gross per 24 hour  Intake    240 ml  Output    300 ml  Net    -60 ml   Weight change: -0.01 kg (-0.4 oz)  Physical Exam: Gen: Appears weak resting in bed CVS: Pulse regular in rate and rhythm, S1 and S2 normal Resp: Fine rales over the bases without wheeze or rhonchi Abd: Soft, flat,  nontender Ext: 2+-3+ lower extremity edema  Imaging: No results found.  Labs: BMET  Recent Labs Lab 11/26/14 0228 11/27/14 0707 11/28/14 1845 11/29/14 0520 11/29/14 0830  NA 143 141 139 140  --   K 3.3* 3.8 3.9 3.4*  --   CL 117* 114* 111 111  --   CO2 16* 15* 16* 16*  --   GLUCOSE 159* 153* 46* 34* 63*  BUN 28* 28* 27* 29*  --   CREATININE 2.14* 2.24* 2.62* 2.71*  --   CALCIUM 8.5* 8.3* 7.8* 7.8*  --   PHOS 3.5  --  3.3  --   --    CBC  Recent Labs Lab 11/26/14 0228 11/27/14 0707  WBC 17.4* 17.9*  NEUTROABS 14.2* 13.6*  HGB 10.5* 10.8*  HCT 32.4* 33.7*  MCV 92.0 94.1  PLT 483* 453*    Medications:    . aspirin EC  81 mg Oral Daily  . cholecalciferol  1,000 Units Oral Daily  . colesevelam  1,875 mg Oral BID WC  . ezetimibe  10 mg Oral q1800  . feeding supplement (PRO-STAT SUGAR FREE 64)  30 mL Oral BID  . heparin  5,000 Units Subcutaneous 3 times per day  . insulin aspart  0-5 Units Subcutaneous QHS  . insulin aspart  0-9 Units Subcutaneous TID WC  . metoprolol succinate  12.5 mg Oral Daily  . pantoprazole (PROTONIX) IV  40 mg Intravenous Q24H  . piperacillin-tazobactam (ZOSYN)  IV  2.25 g Intravenous 4 times per day  . rosuvastatin  20 mg Oral  q1800  . senna-docusate  1 tablet Oral BID  . sodium bicarbonate  650 mg Oral TID      Zetta Bills, MD 11/29/2014, 11:29 AM

## 2014-11-29 NOTE — Progress Notes (Signed)
ANTIBIOTIC CONSULT NOTE Pharmacy Consult for Zosyn Indication: pneumonia  No Known Allergies  Patient Measurements: Height: 5\' 5"  (165.1 cm) Weight: 226 lb 6 oz (102.684 kg) IBW/kg (Calculated) : 57  Vital Signs: Temp: 97.6 F (36.4 C) (06/04 0552) Temp Source: Oral (06/04 0552) BP: 122/61 mmHg (06/04 0552) Pulse Rate: 82 (06/04 0552) Intake/Output from previous day: 06/03 0701 - 06/04 0700 In: 460 [P.O.:360; IV Piggyback:100] Out: 375 [Urine:375] Intake/Output from this shift:    Labs:  Recent Labs  11/26/14 1700 11/27/14 0707 11/28/14 1741 11/28/14 1845 11/29/14 0520  WBC  --  17.9*  --   --   --   HGB  --  10.8*  --   --   --   PLT  --  453*  --   --   --   LABCREA 38.01  --  25.90  --   --   CREATININE  --  2.24*  --  2.62* 2.71*   Estimated Creatinine Clearance: 19.4 mL/min (by C-G formula based on Cr of 2.71).  Recent Labs  11/28/14 1845  VANCORANDOM 20     Medical History: Past Medical History  Diagnosis Date  . Diabetes mellitus   . Hypertension   . Osteopenia   . Peripheral vascular disease, unspecified   . GERD (gastroesophageal reflux disease)   . Arthritis   . Shortness of breath     exersion  . Pneumonia     child  . Anemia     hx  . Fall at home October 09, 2013  . DM (diabetes mellitus) type II uncontrolled, periph vascular disorder 09/17/2014  . Hepatitis     "a"   Anti-infectives    Start     Dose/Rate Route Frequency Ordered Stop   11/29/14 1400  piperacillin-tazobactam (ZOSYN) IVPB 2.25 g     2.25 g 100 mL/hr over 30 Minutes Intravenous 4 times per day 11/29/14 0746     11/28/14 2200  piperacillin-tazobactam (ZOSYN) IVPB 3.375 g  Status:  Discontinued     3.375 g 12.5 mL/hr over 240 Minutes Intravenous Every 8 hours 11/28/14 1500 11/29/14 0746   11/27/14 1000  piperacillin-tazobactam (ZOSYN) IVPB 3.375 g  Status:  Discontinued     3.375 g 12.5 mL/hr over 240 Minutes Intravenous Every 8 hours 11/27/14 0831 11/28/14 1500   11/27/14 0300  cefTRIAXone (ROCEPHIN) 1 g in dextrose 5 % 50 mL IVPB  Status:  Discontinued     1 g 100 mL/hr over 30 Minutes Intravenous Every 24 hours 11/26/14 0802 11/27/14 0829   11/26/14 0745  cefTRIAXone (ROCEPHIN) 1 g in dextrose 5 % 50 mL IVPB  Status:  Discontinued     1 g 100 mL/hr over 30 Minutes Intravenous Every 24 hours 11/26/14 0731 11/26/14 0803   11/26/14 0345  cefTRIAXone (ROCEPHIN) 1 g in dextrose 5 % 50 mL IVPB     1 g 100 mL/hr over 30 Minutes Intravenous  Once 11/26/14 0332 11/26/14 0519      Assessment: 81yo female admitted with suspected aspiration pna.  Scr worsening 2.2 > 2.6 > 2.7, eCrCl ~ 20 ml/min. Will reduce dose of Zosyn.   Goal of Therapy:  Eradicate infection.  Plan:  -Zosyn 2.25 g IV q6h -F/u deescalation, duration of therapy, renal fx   Agapito GamesAlison Chais Fehringer, PharmD, BCPS Clinical Pharmacist Pager: [phone] 11/29/2014 7:49 AM

## 2014-11-29 NOTE — Progress Notes (Signed)
11/29/2014  Hypoglycemic Event  CBG: 52  Treatment: D50 IV 50 mL  Symptoms: None  Follow-up CBG: Time:12:54 CBG Result:128  Possible Reasons for Event: Inadequate meal intake  Comments/MD notified:Notified Izola PriceMyers and Allena KatzPatel. New orders implemented.     [name], Tina Lurz  Remember to initiate Hypoglycemia Order Set & complete

## 2014-11-29 NOTE — Progress Notes (Signed)
Hypoglycemic Event  CBG: 40  Treatment: D50 IV 50 mL  Symptoms: None  Follow-up CBG: Time:2242 CBG Result:140  Possible Reasons for Event: Inadequate meal intake  Comments/MD notified: M.Rayburn GoLynch    Tina [name]  Remember to initiate Hypoglycemia Order Set & complete

## 2014-11-29 NOTE — Progress Notes (Signed)
11/29/2014  Hypoglycemic Event  CBG: 41  Treatment: D50 IV 50 mL  Symptoms: None  Follow-up CBG: Time:1845 CBG Result:115  Possible Reasons for Event: Inadequate meal intake  Comments/MD notified:MD Izola PriceMyers made aware. No new orders at this time  [facility] Reg Med CtrRyanne Hill BSN, RN-BC, RN3 [facility] Phone [phone]  Remember to initiate Hypoglycemia Order Set & complete

## 2014-11-29 NOTE — Progress Notes (Signed)
Hypoglycemic Event  CBG: 37  Treatment: AMP D50  Symptoms: Sweaty  Follow-up CBG: ZOXW:9604Time:0637 CBG Result:119  Possible Reasons for Event: Inadequate meal intake  Comments/MD notified: M. Rayburn GoLynch    Cliffie Gingras I  Remember to initiate Hypoglycemia Order Set & complete

## 2014-11-29 NOTE — Progress Notes (Signed)
Speech Language Pathology Treatment: Dysphagia  Patient Details Name: Tina [name] MRN: [id number] DOB: 06/02/1931 Today's Date: 11/29/2014 Time: [id number] SLP Time Calculation (min) (ACUTE ONLY): 33 min  Assessment / Plan / Recommendation Clinical Impression  Pt anxious for POs with persistent confusion. Impuslivity exhibited with self feeding, requiring tactile cueing for improved swallow safety. Observed directly with nectar thick liquids, mechanical soft and honey thick liquids. Immediate coughing noted following nectar thick liquids concerning for inadequate airway protection. Honey thick liquid trials showed reduction in coughing. Recommend MBS as soon as possible for determination of safest least restrictive diet. Implement diet downgrade honey thick liquids and dysphagia 3 consistencies. Will attempt swallow study in the am.     HPI Other Pertinent Information: 81 year old female with past medical history of dementia, diabetes, hypertension, neurogenic claudication, s/p laminectomy by Dr Jeral FruitBotero in March 2016, subsequently fell and had left subcapital femoral neck fracture, has had ORIF (done by Dr. Margarita Ranaimothy Murphy) and then sent to SNF. While there, she was found to have leukocytosis and increasing creatinine. Pt was observed coughing with PO intake.   Pertinent Vitals Pain Assessment: No/denies pain  SLP Plan  MBS    Recommendations Diet recommendations: Dysphagia 3 (mechanical soft);Honey-thick liquid Liquids provided via: Cup Medication Administration: Crushed with puree Supervision: Patient able to self feed;Full supervision/cueing for compensatory strategies;Staff to assist with self feeding Compensations: Slow rate;Small sips/bites Postural Changes and/or Swallow Maneuvers: Seated upright 90 degrees              Oral Care Recommendations: Oral care BID Plan: MBS    GO    Marcene Duoshelsea Sumney MA, CCC-SLP Acute Care Speech Language Pathologist     Kennieth RadSumney, Kais Monje  E 11/29/2014, 3:52 PM

## 2014-11-29 NOTE — Progress Notes (Signed)
Patient ID: Tina [name], female   DOB: October 19, 1930, 81 y.o.   MRN: [id number] [facility] PROGRESS NOTE  TUWANDA VOKES WUJ:[id number] DOB: 07-18-1930 DOA: 11/26/2014 PCP: Fredirick Maudlin, MD  Brief narrative:    81 year old female with past medical history of dementia, diabetes, hypertension, neurogenic claudication, s/p laminectomy by Dr Jeral Fruit in March 2016, subsequently fell and had left subcapital femoral neck fracture, has had ORIF (done by Dr. Margarita Rana) and then sent to SNF. While there, she was found to have leukocytosis and increasing creatinine.  She was admitted to [facility] and was under Dr.Hawkins care but per family request was transferred to [facility] 11/27/2014. She was found to have new elevation in creatinine, 2.14 on admission (back in 09/2014 her Cr was WNL but in 08/2014 it was 1.38). She was also found to have acute systolic and diastolic CHF and was placed on lasix 80 mg IV Q 12 hours. She has been seen by renal while in [facility]. She was initially on rocephin but this was changed to zosyn while in [facility] since there was also a concern that she may be aspirating. SLP consulted.  Barrier to discharge: worsening renal failure for which reason renal was consulted. Also needs SLP evaluation to see if current diet is adequate for her. She has significant cough this am with swallowing. She is on zosyn for possible aspiration. Also, she is receiving IV lasix for acute CHF. Cardiology consulted.   Assessment/Plan:    Principal Problem: Acute encephalopathy - Likely combination of dementia, UTI and possible aspiration  - Pt oriented to place and person this am - PT evaluation completed 11/28/2014 and recommendation is to d/c to SNF once medically stable   Active Problems:  Acute on chronic kidney disease, stage 4 / Metabolic acidosis  - Likely from cardiorenal mechanisms with CHF exacerbation with evolution to ATNper nephrology team, appreciate renal consult and recommendations -  metabolic acidosis secondary to acute renal failure, continue sodium bicarb supplementation  - She received IV fluids on admission but she has LE edema and evidence of CHF so IV fluids stopped, 11/28/2014. - per nephrology team, no changes in medical regimen for now, continue same diuretic regimen - monitor daily weights, I/O  Acute on chronic combined systolic and diastolic CHF - 2 D ECHO on this admission showed EF of 35-40%. In addition, has LE pitting edema at least +1-2. - The following is the weight trend since 11/26/2014: 101.5 kg --> 100.2 kg --> 102.6 kg - Cardio consulted - Daily weight and strict intake and output to be monitored  - Continue lasix 80 mg IV BID - Replete electrolytes      HTN (hypertension), benign - reasonable inpatient control  - Currently receiving lasix 80 mg IV BID   Hypokalemia - Due to lasix, also noted low mag several days ago but has not been repeated  - check Mg level in AM and supplement if needed  - continue to supplement and repeat BMP in AM   Aspiration pneumonitis / UTI / Leukocytosis - Leukocytosis likely due to UTI and possible aspiration pneumonia - Initially on rocephin but changed to zosyn to cover for both, UTI and aspiration pneumonitis - Aspiration precaution, continue Zosyn day #4/7 - SLP evaluation completed, dys III diet recommended    DM type 2 (diabetes mellitus, type 2) with renal manifestations - A1c in 08/2014 5.8 indicating good glycemic control - D/C'ed PO antihyperglycemics while aspiration precaution - Use SSI - DM coordinator  consulted, recommendations appreciated    Anemia of chronic kidney disease - Hemoglobin 10.8, no signs of active bleeding  - Stable    Dyslipidemia - Continue crestor   Pressure ulcer stage 1 - Intact skin with non-blanchable redness  - Care per floor RN   Morbid obesity - pt meets criteria with BMI > 37 and underlying risk factors of HTN, HLD - Body mass index is 37.67 kg/(m^2).   DVT  Prophylaxis  - Heparin subQ ordered    Code Status: Full.  Family Communication:  plan of care discussed with the patient Disposition Plan: SNF once medically stable   IV access:  Peripheral IV  Procedures and diagnostic studies:    TTE 11/27/14 -  EF 35 -40%, indeterminate diastolic function   Dg Chest 1 View 11/27/2014 Study is limited by poor inspiration. Persistent mild congestion/ interstitial edema. Left arm PICC line with tip at the junction of SVC with brachiocephalic vein is unchanged in position. There is no pneumothorax. Mild basilar atelectasis. Question small left pleural effusion.     Ct Head Wo Contrast 11/27/2014   No acute intracranial abnormality. Stable atrophy and chronic white matter disease. Stable lacunar infarct in left corona radiata. No definite acute cortical infarction.     US Renal 11/26/2014   Study within normal limits.    Dg Chest Port 1 View 11/26/2014  1. Findings consistent with CHF. 2. Tip of the left upper extremity PICC in the proximal SVC/distal brachiocephalic vein.    Medical Consultants:  Nephrology - Dr. Allena Katz Cardiology  Other Consultants:  SLP Physical therapy Diabetic coordinator  IAnti-Infectives:   Zosyn 11/26/2014 -->   Debbora Presto, MD  [facility] Pager [phone]  Time spent in minutes: 25 minutes  If 7PM-7AM, please contact night-coverage [email or web address] Password [email or web address] 11/29/2014, 3:10 PM   LOS: 3 days    HPI/Subjective: No acute overnight events. Patient reports no nausea.   Objective: Filed Vitals:   11/28/14 1823 11/28/14 2132 11/29/14 0552 11/29/14 0935  BP: 120/64 123/34 122/61 114/54  Pulse: 69 80 82 98  Temp: 98.2 F (36.8 C) 97.4 F (36.3 C) 97.6 F (36.4 C) 97.5 F (36.4 C)  TempSrc: Oral Oral Oral Oral  Resp: Height:      Weight:   102.684 kg (226 lb 6 oz)   SpO2: 96% 96% 98% 99%    Intake/Output Summary (Last 24 hours) at 11/29/14 1510 Last data filed at 11/29/14 0900  Gross  per 24 hour  Intake    240 ml  Output    300 ml  Net    -60 ml    Exam:   General:  Pt is alert, follows commands appropriately, not in acute distress  Cardiovascular: Regular rate and rhythm, S1/S2 (+)  Respiratory: Clear to auscultation bilaterally, no wheezing, diminished breath sounds at bases   Abdomen: Soft, non tender, non distended, bowel sounds present  Extremities: +2 LE pitting edema, pulses DP and PT palpable bilaterally  Neuro: Grossly nonfocal  Data Reviewed: Basic Metabolic Panel:  Recent Labs Lab 11/26/14 0228 11/27/14 0707 11/28/14 1845 11/29/14 0520 11/29/14 0830  NA 143 141 139 140  --   K 3.3* 3.8 3.9 3.4*  --   CL 117* 114* 111 111  --   CO2 16* 15* 16* 16*  --   GLUCOSE 159* 153* 46* 34* 63*  BUN 28* 28* 27* 29*  --   CREATININE 2.14* 2.24* 2.62* 2.71*  --  CALCIUM 8.5* 8.3* 7.8* 7.8*  --   MG 1.6*  --   --   --   --   PHOS 3.5  --  3.3  --   --    Liver Function Tests:  Recent Labs Lab 11/26/14 0228 11/28/14 1845  AST 13*  --   ALT 11*  --   ALKPHOS 107  --   BILITOT 0.6  --   PROT 5.4*  --   ALBUMIN 2.1* 1.7*   CBC:  Recent Labs Lab 11/26/14 0228 11/27/14 0707  WBC 17.4* 17.9*  NEUTROABS 14.2* 13.6*  HGB 10.5* 10.8*  HCT 32.4* 33.7*  MCV 92.0 94.1  PLT 483* 453*   Cardiac Enzymes:  Recent Labs Lab 11/26/14 0913  CKTOTAL 25*  CKMB 5.7*   BNP: Invalid input(s): POCBNP CBG:  Recent Labs Lab 11/29/14 0552 11/29/14 0637 11/29/14 0801 11/29/14 1159 11/29/14 1254  GLUCAP 37* 119* 94 52* 128*    Recent Results (from the past 240 hour(s))  Urine culture     Status: None   Collection Time: 11/26/14  2:00 AM  Result Value Ref Range Status   Specimen Description URINE, CATHETERIZED  Final   Special Requests NONE  Final   Culture NO GROWTH  Final   Report Status 11/27/2014 FINAL  Final  MRSA PCR Screening     Status: None   Collection Time: 11/26/14 12:00 PM  Result Value Ref Range Status   MRSA by PCR  NEGATIVE NEGATIVE Final     Scheduled Meds: . aspirin EC  81 mg Oral Daily  . cholecalciferol  1,000 Units Oral Daily  . colesevelam  1,875 mg Oral BID WC  . ezetimibe  10 mg Oral q1800  . feeding supplement   30 mL Oral BID  . furosemide  80 mg Intravenous BID  . heparin  5,000 Units Subcutaneous 3 times per day  . insulin aspart  0-5 Units Subcutaneous QHS  . insulin aspart  0-9 Units Subcutaneous TID WC  . [START ON 11/29/2014] pantoprazole (PROTONIX) IV  40 mg Intravenous Q24H  . piperacillin-tazobactam (ZOSYN)  IV  3.375 g Intravenous Q8H  . rosuvastatin  20 mg Oral q1800  . senna-docusate  1 tablet Oral BID   Continuous Infusions: . sodium chloride 20 mL/hr at 11/29/14 0840  . dextrose 40 mL/hr at 11/29/14 1419

## 2014-11-29 NOTE — Progress Notes (Signed)
Patient ID: Tina [name], female   DOB: [birth year], 81 y.o.   MRN: [id number]     Subjective:    No compliants   Objective:   Temp:  [97.4 F (36.3 C)-98.2 F (36.8 C)] 97.6 F (36.4 C) (06/04 0552) Pulse Rate:  [69-82] 82 (06/04 0552) Resp:  [22-24] 22 (06/04 0552) BP: (116-123)/(34-64) 122/61 mmHg (06/04 0552) SpO2:  [96 %-98 %] 98 % (06/04 0552) Weight:  [226 lb 6 oz (102.684 kg)] 226 lb 6 oz (102.684 kg) (06/04 0552) Last BM Date: 11/28/14  Filed Weights   11/27/14 0528 11/27/14 1944 11/29/14 0552  Weight: 220 lb 14.4 oz (100.2 kg) 226 lb 6.4 oz (102.694 kg) 226 lb 6 oz (102.684 kg)    Intake/Output Summary (Last 24 hours) at 11/29/14 0837 Last data filed at 11/29/14 0831  Gross per 24 hour  Intake    480 ml  Output    375 ml  Net    105 ml    Telemetry: SR and PSVT  Exam:  General: NAD   Resp: crackles bilateral bases  Cardiac: RRR, no m/r/g, elevated JVD  GI: abdomen soft, NT, ND  MSK: 1+ bilateral LE edema  Neuro: no focal deficits   Lab Results:  Basic Metabolic Panel:  Recent Labs Lab 11/26/14 0228 11/27/14 0707 11/28/14 1845 11/29/14 0520  NA 143 141 139 140  K 3.3* 3.8 3.9 3.4*  CL 117* 114* 111 111  CO2 16* 15* 16* 16*  GLUCOSE 159* 153* 46* 34*  BUN 28* 28* 27* 29*  CREATININE 2.14* 2.24* 2.62* 2.71*  CALCIUM 8.5* 8.3* 7.8* 7.8*  MG 1.6*  --   --   --     Liver Function Tests:  Recent Labs Lab 11/26/14 0228 11/28/14 1845  AST 13*  --   ALT 11*  --   ALKPHOS 107  --   BILITOT 0.6  --   PROT 5.4*  --   ALBUMIN 2.1* 1.7*    CBC:  Recent Labs Lab 11/26/14 0228 11/27/14 0707  WBC 17.4* 17.9*  HGB 10.5* 10.8*  HCT 32.4* 33.7*  MCV 92.0 94.1  PLT 483* 453*    Cardiac Enzymes:  Recent Labs Lab 11/26/14 0913  CKTOTAL 25*  CKMB 5.7*    BNP: No results for input(s): PROBNP in the last 8760 hours.  Coagulation: No results for input(s): INR in the last 168 hours.  ECG:   Medications:   Scheduled  Medications: . aspirin EC  81 mg Oral Daily  . cholecalciferol  1,000 Units Oral Daily  . colesevelam  1,875 mg Oral BID WC  . ezetimibe  10 mg Oral q1800  . feeding supplement (PRO-STAT SUGAR FREE 64)  30 mL Oral BID  . furosemide  80 mg Intravenous BID  . heparin  5,000 Units Subcutaneous 3 times per day  . insulin aspart  0-5 Units Subcutaneous QHS  . insulin aspart  0-9 Units Subcutaneous TID WC  . pantoprazole (PROTONIX) IV  40 mg Intravenous Q24H  . piperacillin-tazobactam (ZOSYN)  IV  2.25 g Intravenous 4 times per day  . rosuvastatin  20 mg Oral q1800  . senna-docusate  1 tablet Oral BID  . sodium bicarbonate  650 mg Oral TID     Infusions:     PRN Medications:  acetaminophen, albuterol, RESOURCE THICKENUP CLEAR, sodium chloride     Assessment/Plan    81 yo female hx of HTN, DM, dementia, CKD we are consulted on for SOB and volume  overload.  1. Acute systolic heart failure - echo LVEF 35-40%, indeterminate diastolic function, mild to mod MR - she is on lasix 80mg  IV bid, minimal diuresis accoding to I/Os. Cr and BUN trending up.  - CXR 11/27/14 with mild edema - weights indication up from 220 on 6/1 to 226 6/2 and stable at 226 today. Exam supports volume overload.   - from cardiac standpoint she remains volume overloaded. Diuresis is complicated by her AKI, we will defer diuretic dosing to renal. From there notes concern for possible interstitial nephritis given WBCs in urine.    2. PSVT - will start Toprol XL 12.5mg  daily, avoid aggressive titration while she is volume overloaded.     Dina RichJonathan Nycholas Rayner, M.D.

## 2014-11-30 ENCOUNTER — Inpatient Hospital Stay (HOSPITAL_COMMUNITY): Payer: Medicare Other

## 2014-11-30 LAB — BASIC METABOLIC PANEL
ANION GAP: 12 (ref 5–15)
BUN: 28 mg/dL — ABNORMAL HIGH (ref 6–20)
CALCIUM: 7.6 mg/dL — AB (ref 8.9–10.3)
CHLORIDE: 112 mmol/L — AB (ref 101–111)
CO2: 17 mmol/L — ABNORMAL LOW (ref 22–32)
Creatinine, Ser: 2.88 mg/dL — ABNORMAL HIGH (ref 0.44–1.00)
GFR, EST AFRICAN AMERICAN: 17 mL/min — AB (ref >60)
GFR, EST NON AFRICAN AMERICAN: 14 mL/min — AB (ref >60)
Glucose, Bld: 29 mg/dL — CL (ref 65–99)
Potassium: 2.8 mmol/L — ABNORMAL LOW (ref 3.5–5.1)
SODIUM: 141 mmol/L (ref 135–145)

## 2014-11-30 LAB — GLUCOSE, CAPILLARY
GLUCOSE-CAPILLARY: 28 mg/dL — AB (ref 65–99)
GLUCOSE-CAPILLARY: 155 mg/dL — AB (ref 65–99)
GLUCOSE-CAPILLARY: 24 mg/dL — AB (ref 65–99)
GLUCOSE-CAPILLARY: 26 mg/dL — AB (ref 65–99)
Glucose-Capillary: 124 mg/dL — ABNORMAL HIGH (ref 65–99)
Glucose-Capillary: 70 mg/dL (ref 65–99)
Glucose-Capillary: 57 mg/dL — ABNORMAL LOW (ref 65–99)
Glucose-Capillary: 139 mg/dL — ABNORMAL HIGH (ref 65–99)
Glucose-Capillary: 31 mg/dL — CL (ref 65–99)
Glucose-Capillary: 65 mg/dL (ref 65–99)

## 2014-11-30 LAB — URINE MICROSCOPIC-ADD ON

## 2014-11-30 LAB — URINALYSIS, ROUTINE W REFLEX MICROSCOPIC
BILIRUBIN URINE: NEGATIVE
Glucose, UA: NEGATIVE mg/dL
KETONES UR: NEGATIVE mg/dL
LEUKOCYTES UA: NEGATIVE
NITRITE: NEGATIVE
PH: 5 (ref 5.0–8.0)
Protein, ur: NEGATIVE mg/dL
Specific Gravity, Urine: 1.012 (ref 1.005–1.030)
UROBILINOGEN UA: 0.2 mg/dL (ref 0.0–1.0)

## 2014-11-30 LAB — MAGNESIUM: Magnesium: 1.3 mg/dL — ABNORMAL LOW (ref 1.7–2.4)

## 2014-11-30 MED ORDER — DEXTROSE 50 % IV SOLN
INTRAVENOUS | Status: AC
  Administered 2014-11-30: 50 mL
  Filled 2014-11-30: qty 50

## 2014-11-30 MED ORDER — POTASSIUM CHLORIDE 20 MEQ/15ML (10%) PO SOLN
40.0000 meq | Freq: Two times a day (BID) | ORAL | Status: AC
Start: 2014-11-30 — End: 2014-12-01
  Administered 2014-11-30 (×2): 40 meq via ORAL
  Filled 2014-11-30 (×3): qty 30

## 2014-11-30 MED ORDER — HYDRALAZINE HCL 10 MG PO TABS
10.0000 mg | ORAL_TABLET | Freq: Three times a day (TID) | ORAL | Status: DC
  Administered 2014-11-30 – 2014-12-10 (×11): 10 mg via ORAL
  Filled 2014-11-30 (×34): qty 1

## 2014-11-30 MED ORDER — ISOSORBIDE DINITRATE 5 MG PO TABS
5.0000 mg | ORAL_TABLET | Freq: Three times a day (TID) | ORAL | Status: DC
  Administered 2014-11-30 – 2014-12-10 (×19): 5 mg via ORAL
  Filled 2014-11-30 (×34): qty 1

## 2014-11-30 MED ORDER — MAGNESIUM SULFATE 2 GM/50ML IV SOLN
2.0000 g | Freq: Once | INTRAVENOUS | Status: AC
  Administered 2014-11-30: 2 g via INTRAVENOUS
  Filled 2014-11-30: qty 50

## 2014-11-30 MED ORDER — GLUCOSE 40 % PO GEL
ORAL | Status: AC
Start: 2014-11-30 — End: 2014-12-01
  Filled 2014-11-30: qty 1

## 2014-11-30 MED ORDER — FUROSEMIDE 10 MG/ML IJ SOLN
60.0000 mg | Freq: Two times a day (BID) | INTRAMUSCULAR | Status: DC
  Administered 2014-11-30 (×2): 60 mg via INTRAVENOUS
  Filled 2014-11-30 (×4): qty 6

## 2014-11-30 NOTE — Progress Notes (Signed)
Patient ID: [name] Neis, female   DOB: 05/23/1931, 81 y.o.   MRN: [id number]  [facility] Progress Note    Assessment/ Plan:   1. Acute renal failure: Currently nonoliguric with foley catheter in place. This appears to be multifactorial and currently likely from cardiorenal mechanisms with CHF exacerbation. Vancomycin levels (random) with a nontoxic limits. Labs suggest probable evolution to ATN-repeat urinalysis yesterday with significantly fewer RBC/WBC indicating initial collection likely affected by Foley trauma/irritation. Furosemide discontinued yesterday morning-appears there was a time limit to the order initially placed-restart furosemide she remains volume overloaded 2. Anion gap metabolic acidosis: Appears to be secondary to acute renal failure, continue oral sodium bicarbonate. 3. CHF exacerbation: Continue to optimize diuretics therapy at this time with daily weights, input/output measurements 4. Anemia: Hemoglobin currently stable, without overt bleed, continue to monitor. 5. Leukocytosis: No clear evidence of source of infection-cultures negative to date 6. Hypokalemia: We'll replace potassium via oral route (losses likely secondary to diuresis and restricted intake)  Subjective:   Reports to be very thirsty-denies any chest pain or shortness of breath    Objective:   BP 125/71 mmHg  Pulse 78  Temp(Src) 98.8 F (37.1 C) (Oral)  Resp 18  Ht 5\' 5"  (1.651 m)  Wt 103.42 kg (228 lb)  BMI 37.94 kg/m2  SpO2 97%  Intake/Output Summary (Last 24 hours) at 11/30/14 1108 Last data filed at 11/30/14 1032  Gross per 24 hour  Intake 1123.66 ml  Output   1100 ml  Net  23.66 ml   Weight change: 0.736 kg (1 lb 10 oz)  Physical Exam: Gen: Uncomfortable resting in bed-awaiting nurse technician to get her some fluids CVS: Pulse regular in rate and rhythm Resp: Fine rales bibasally-no wheeze Abd: Soft, obese, nontender Ext: Trace edema of her lower  extremities/back  Imaging: No results found.  Labs: BMET  Recent Labs Lab 11/26/14 0228 11/27/14 0707 11/28/14 1845 11/29/14 0520 11/29/14 0830 11/30/14 0437  NA 143 141 139 140  --  141  K 3.3* 3.8 3.9 3.4*  --  2.8*  CL 117* 114* 111 111  --  112*  CO2 16* 15* 16* 16*  --  17*  GLUCOSE 159* 153* 46* 34* 63* 29*  BUN 28* 28* 27* 29*  --  28*  CREATININE 2.14* 2.24* 2.62* 2.71*  --  2.88*  CALCIUM 8.5* 8.3* 7.8* 7.8*  --  7.6*  PHOS 3.5  --  3.3  --   --   --    CBC  Recent Labs Lab 11/26/14 0228 11/27/14 0707  WBC 17.4* 17.9*  NEUTROABS 14.2* 13.6*  HGB 10.5* 10.8*  HCT 32.4* 33.7*  MCV 92.0 94.1  PLT 483* 453*    Medications:    . aspirin EC  81 mg Oral Daily  . cholecalciferol  1,000 Units Oral Daily  . colesevelam  1,875 mg Oral BID WC  . ezetimibe  10 mg Oral q1800  . feeding supplement (PRO-STAT SUGAR FREE 64)  30 mL Oral BID  . heparin  5,000 Units Subcutaneous 3 times per day  . hydrALAZINE  10 mg Oral 3 times per day  . insulin aspart  0-5 Units Subcutaneous QHS  . isosorbide dinitrate  5 mg Oral TID  . metoprolol succinate  12.5 mg Oral Daily  . pantoprazole (PROTONIX) IV  40 mg Intravenous Q24H  . piperacillin-tazobactam (ZOSYN)  IV  2.25 g Intravenous 4 times per day  . rosuvastatin  20 mg Oral q1800  .  senna-docusate  1 tablet Oral BID  . sodium bicarbonate  650 mg Oral TID    Zetta Bills, MD 11/30/2014, 11:08 AM

## 2014-11-30 NOTE — Progress Notes (Signed)
11/30/2014 6:53 PM  Patient daughter at bedside. Stated that she wanted labs ordered for her mother to check her WBC and wanted her mother to be seen by her own doctor Pearson GrippeJames Kim MD who has been treating the patient for the past 4 months. She stated that she has requested this for two days now and told the supervisor before she was transferred from [facility]. Communicated to the daughter that I would let attending MD know of her concerns with the labs being drawn and the change in physician care. MD made aware. Will continue to assess and monitor the patient.   Family member stated when she left that she would be calling doctor Pearson GrippeJames Kim (Internal Medicine) in the morning.   PACCAR Incyanne Hill BSN, RN-BC, [employer] [facility] Phone [phone]

## 2014-11-30 NOTE — Progress Notes (Addendum)
This morning, NT had difficulty attaining temperature on patient.  Oral temperature recorded at 94.3 F using both Dinamap and portable thermometer.  Checked rectal temperature, which is 97.7 F.  Paged M. Burnadette PeterLynch, PA.  Awaiting call back.  Will continue to monitor.

## 2014-11-30 NOTE — Progress Notes (Signed)
Patient ID: Tina [name], female   DOB: 11/16/1930, 81 y.o.   MRN: [id number] [facility] PROGRESS NOTE  Tina [name] YNW:[id number] DOB: 02-27-1931 DOA: 11/26/2014 PCP: Fredirick Maudlin, MD  Brief narrative:    81 year old female with past medical history of dementia, diabetes, hypertension, neurogenic claudication, s/p laminectomy by Dr Jeral Fruit in March 2016, subsequently fell and had left subcapital femoral neck fracture, has had ORIF (done by Dr. Margarita Rana) and then sent to SNF. While there, she was found to have leukocytosis and increasing creatinine.  She was admitted to [facility] and was under Dr.Hawkins care but per family request was transferred to [facility] 11/27/2014. She was found to have new elevation in creatinine, 2.14 on admission (back in 09/2014 her Cr was WNL but in 08/2014 it was 1.38). She was also found to have acute systolic and diastolic CHF and was placed on lasix 80 mg IV Q 12 hours. She has been seen by renal while in [facility]. She was initially on rocephin but this was changed to zosyn while in [facility] since there was also a concern that she may be aspirating. SLP consulted.  Barrier to discharge: worsening renal failure for which reason renal was consulted. Also needs SLP evaluation to see if current diet is adequate for her. She has significant cough this am with swallowing. She is on zosyn for possible aspiration. Also, she is receiving IV lasix for acute CHF. Cardiology consulted.   Assessment/Plan:    Principal Problem: Acute encephalopathy - Likely combination of dementia, UTI and possible aspiration  - Pt oriented to place and person this am - PT evaluation completed 11/28/2014 and recommendation is to d/c to SNF once medically stable   Active Problems:  Acute on chronic kidney disease, stage 4 / Metabolic acidosis  - Likely from cardiorenal mechanisms with CHF exacerbation with evolution to ATNper nephrology team, appreciate renal consult and recommendations -  metabolic acidosis secondary to acute renal failure, continue sodium bicarb supplementation  - She received IV fluids on admission but due to LE edema and evidence of CHF, IV fluids stopped, 11/28/2014. - per nephrology team, no changes in medical regimen for now, continue same diuretic regimen - monitor daily weights, I/O   Acute on chronic combined systolic and diastolic CHF - 2 D ECHO on this admission showed EF of 35-40%. In addition, has LE pitting edema at least +1-2. - The following is the weight trend since 11/26/2014: 101.5 kg --> 100.2 kg --> 102.6 kg --> 103 kg - Cardio consulted - Daily weight and strict intake and output to be monitored  - Continue lasix 60 mg IV BID - Replete electrolytes      HTN (hypertension), benign - reasonable inpatient control  - Currently receiving lasix 80 mg IV BID   Hypokalemia, hypomagnesemia  - Due to lasix, also noted low mag - supplemented both electrolytes  - continue to supplement and repeat BMP in AM   Aspiration pneumonitis / UTI / Leukocytosis - Leukocytosis likely due to UTI and possible aspiration pneumonia - Initially on rocephin but changed to zosyn to cover for both, UTI and aspiration pneumonitis - Aspiration precaution, continue Zosyn day #5/7, check CBC in AM - SLP evaluation completed, dys III diet recommended    DM type 2 (diabetes mellitus, type 2) with renal manifestations and severe hypoglycemia  - A1c in 08/2014 5.8 indicating good glycemic control - D/C'ed PO antihyperglycemics while aspiration precaution - stop SSI as well - DM coordinator  consulted, recommendations appreciated    Anemia of chronic kidney disease - Hemoglobin overall stable, no signs of active bleeding  - Stable    Dyslipidemia - Continue crestor   Pressure ulcer stage 1 - Intact skin with non-blanchable redness  - Care per floor RN   Morbid obesity - pt meets criteria with BMI > 37 and underlying risk factors of HTN, HLD - Body mass index  is 37.94 kg/(m^2).   DVT Prophylaxis  - Heparin subQ ordered   Code Status: Full.  Family Communication:  plan of care discussed with the patient Disposition Plan: SNF once medically stable   IV access:  Peripheral IV  Procedures and diagnostic studies:    TTE 11/27/14 -  EF 35 -40%, indeterminate diastolic function   Dg Chest 1 View 11/27/2014 Study is limited by poor inspiration. Persistent mild congestion/ interstitial edema. Left arm PICC line with tip at the junction of SVC with brachiocephalic vein is unchanged in position. There is no pneumothorax. Mild basilar atelectasis. Question small left pleural effusion.     Ct Head Wo Contrast 11/27/2014   No acute intracranial abnormality. Stable atrophy and chronic white matter disease. Stable lacunar infarct in left corona radiata. No definite acute cortical infarction.     [place] Renal 11/26/2014   Study within normal limits.    Dg Chest Port 1 View 11/26/2014  1. Findings consistent with CHF. 2. Tip of the left upper extremity PICC in the proximal SVC/distal brachiocephalic vein.    Medical Consultants:  Nephrology - Dr. Allena KatzPatel Cardiology  Other Consultants:  SLP Physical therapy Diabetic coordinator  IAnti-Infectives:   Zosyn 11/26/2014 -->   Debbora PrestoMAGICK-Ayianna Darnold, MD  [facility] Pager [phone]  Time spent in minutes: 25 minutes  If 7PM-7AM, please contact night-coverage [email or web address] Password [email or web address] 11/30/2014, 12:15 PM   LOS: 4 days    HPI/Subjective: No acute overnight events. Patient reports chills this AM, poor oral intake.   Objective: Filed Vitals:   11/30/14 0525 11/30/14 0634 11/30/14 0641 11/30/14 1031  BP: 120/48   125/71  Pulse: 53 63  78  Temp: 94.3 F (34.6 C)  97.7 F (36.5 C) 98.8 F (37.1 C)  TempSrc: Oral  Rectal Oral  Resp: 16   18  Height:      Weight:      SpO2: 97%   97%    Intake/Output Summary (Last 24 hours) at 11/30/14 1215 Last data filed at 11/30/14 1212  Gross per 24 hour   Intake 1753.66 ml  Output   1100 ml  Net 653.66 ml    Exam:   General:  Pt is alert, follows commands appropriately, not in acute distress  Cardiovascular: Regular rate and rhythm, S1/S2 (+)  Respiratory: Clear to auscultation bilaterally, no wheezing, diminished breath sounds at bases with bibasilar crackles   Abdomen: Soft, non tender, non distended, bowel sounds present  Extremities: +2 LE pitting edema, pulses DP and PT palpable bilaterally  Neuro: Grossly nonfocal  Data Reviewed: Basic Metabolic Panel:  Recent Labs Lab 11/26/14 0228 11/27/14 0707 11/28/14 1845 11/29/14 0520 11/29/14 0830 11/30/14 0437  NA 143 141 139 140  --  141  K 3.3* 3.8 3.9 3.4*  --  2.8*  CL 117* 114* 111 111  --  112*  CO2 16* 15* 16* 16*  --  17*  GLUCOSE 159* 153* 46* 34* 63* 29*  BUN 28* 28* 27* 29*  --  28*  CREATININE 2.14* 2.24* 2.62* 2.71*  --  2.88*  CALCIUM 8.5* 8.3* 7.8* 7.8*  --  7.6*  MG 1.6*  --   --   --   --  1.3*  PHOS 3.5  --  3.3  --   --   --    Liver Function Tests:  Recent Labs Lab 11/26/14 0228 11/28/14 1845  AST 13*  --   ALT 11*  --   ALKPHOS 107  --   BILITOT 0.6  --   PROT 5.4*  --   ALBUMIN 2.1* 1.7*   CBC:  Recent Labs Lab 11/26/14 0228 11/27/14 0707  WBC 17.4* 17.9*  NEUTROABS 14.2* 13.6*  HGB 10.5* 10.8*  HCT 32.4* 33.7*  MCV 92.0 94.1  PLT 483* 453*   Cardiac Enzymes:  Recent Labs Lab 11/26/14 0913  CKTOTAL 25*  CKMB 5.7*   BNP: Invalid input(s): POCBNP CBG:  Recent Labs Lab 11/29/14 2215 11/30/14 0544 11/30/14 0612 11/30/14 0754 11/30/14 1209  GLUCAP 128* 28* 124* 70 57*    Recent Results (from the past 240 hour(s))  Urine culture     Status: None   Collection Time: 11/26/14  2:00 AM  Result Value Ref Range Status   Specimen Description URINE, CATHETERIZED  Final   Special Requests NONE  Final   Culture NO GROWTH  Final   Report Status 11/27/2014 FINAL  Final  MRSA PCR Screening     Status: None    Collection Time: 11/26/14 12:00 PM  Result Value Ref Range Status   MRSA by PCR NEGATIVE NEGATIVE Final     Scheduled Meds: . aspirin EC  81 mg Oral Daily  . cholecalciferol  1,000 Units Oral Daily  . colesevelam  1,875 mg Oral BID WC  . ezetimibe  10 mg Oral q1800  . feeding supplement   30 mL Oral BID  . furosemide  80 mg Intravenous BID  . heparin  5,000 Units Subcutaneous 3 times per day  . insulin aspart  0-5 Units Subcutaneous QHS  . insulin aspart  0-9 Units Subcutaneous TID WC  . [START ON 11/29/2014] pantoprazole (PROTONIX) IV  40 mg Intravenous Q24H  . piperacillin-tazobactam (ZOSYN)  IV  3.375 g Intravenous Q8H  . rosuvastatin  20 mg Oral q1800  . senna-docusate  1 tablet Oral BID   Continuous Infusions: . dextrose 20 mL/hr at 11/29/14 2200

## 2014-11-30 NOTE — Progress Notes (Signed)
Subjective:    Denies any SOB  Objective:   Temp:  [94.3 F (34.6 C)-98.5 F (36.9 C)] 97.7 F (36.5 C) (06/05 0641) Pulse Rate:  [53-111] 63 (06/05 0634) Resp:  [16-28] 16 (06/05 0525) BP: (111-120)/(48-61) 120/48 mmHg (06/05 0525) SpO2:  [94 %-99 %] 97 % (06/05 0525) Weight:  [228 lb (103.42 kg)] 228 lb (103.42 kg) (06/04 2135) Last BM Date: 11/29/14  Filed Weights   11/27/14 1944 11/29/14 0552 11/29/14 2135  Weight: 226 lb 6.4 oz (102.694 kg) 226 lb 6 oz (102.684 kg) 228 lb (103.42 kg)    Intake/Output Summary (Last 24 hours) at 11/30/14 0755 Last data filed at 11/30/14 0706  Gross per 24 hour  Intake 1050.33 ml  Output   1100 ml  Net -49.67 ml    Telemetry: SR, sinus tach, PSVT  Exam:  General: NAD  Resp: crackles bilateral bases  Cardiac: RRR, no m/r/g, elevated JVD  ZO:[id number] soft, NT, ND  MSK: no LE edema  Neuro: no focal deficits   Lab Results:  Basic Metabolic Panel:  Recent Labs Lab 11/26/14 0228  11/28/14 1845 11/29/14 0520 11/29/14 0830 11/30/14 0437  NA 143  < > 139 140  --  141  K 3.3*  < > 3.9 3.4*  --  2.8*  CL 117*  < > 111 111  --  112*  CO2 16*  < > 16* 16*  --  17*  GLUCOSE 159*  < > 46* 34* 63* 29*  BUN 28*  < > 27* 29*  --  28*  CREATININE 2.14*  < > 2.62* 2.71*  --  2.88*  CALCIUM 8.5*  < > 7.8* 7.8*  --  7.6*  MG 1.6*  --   --   --   --  1.3*  < > = values in this interval not displayed.  Liver Function Tests:  Recent Labs Lab 11/26/14 0228 11/28/14 1845  AST 13*  --   ALT 11*  --   ALKPHOS 107  --   BILITOT 0.6  --   PROT 5.4*  --   ALBUMIN 2.1* 1.7*    CBC:  Recent Labs Lab 11/26/14 0228 11/27/14 0707  WBC 17.4* 17.9*  HGB 10.5* 10.8*  HCT 32.4* 33.7*  MCV 92.0 94.1  PLT 483* 453*    Cardiac Enzymes:  Recent Labs Lab 11/26/14 0913  CKTOTAL 25*  CKMB 5.7*    BNP: No results for input(s): PROBNP in the last 8760 hours.  Coagulation: No results for input(s): INR in the last 168  hours.  ECG:   Medications:   Scheduled Medications: . aspirin EC  81 mg Oral Daily  . cholecalciferol  1,000 Units Oral Daily  . colesevelam  1,875 mg Oral BID WC  . ezetimibe  10 mg Oral q1800  . feeding supplement (PRO-STAT SUGAR FREE 64)  30 mL Oral BID  . heparin  5,000 Units Subcutaneous 3 times per day  . insulin aspart  0-5 Units Subcutaneous QHS  . metoprolol succinate  12.5 mg Oral Daily  . pantoprazole (PROTONIX) IV  40 mg Intravenous Q24H  . piperacillin-tazobactam (ZOSYN)  IV  2.25 g Intravenous 4 times per day  . rosuvastatin  20 mg Oral q1800  . senna-docusate  1 tablet Oral BID  . sodium bicarbonate  650 mg Oral TID     Infusions: . dextrose 20 mL/hr at 11/29/14 2200     PRN Medications:  acetaminophen, albuterol, RESOURCE THICKENUP CLEAR, sodium  chloride     Assessment/Plan   81 yo female hx of HTN, DM, dementia, CKD we are consulted on for SOB and volume overload.  1. Acute systolic heart failure - echo LVEF 35-40%, indeterminate diastolic function, mild to mod MR - Minimal diuresis accoding to I/Os. Cr and BUN trending up, though suspicion of potential ATN according to renal notes - CXR 11/27/14 with mild edema - weights indication up from 220 on 6/1 to 226 6/2, today up to 228. Exam supports volume overload.   - from cardiac standpoint she remains volume overloaded. Diuresis is complicated by her AKI, we will defer diuretic dosing to renal, currently she is off diuretics.  - started low dose Toprol XL yesterday. Avoid ACE/ARB in setting of AKI. We will try low dose hydral/nitrates for afterload reduction.   2. PSVT - started Toprol XL yesterday - significant hypokalemia and hypomag today, please keep K at 4 and Mg at 2 - would focus on normalizing electrolytes, if PSVT episodes continue can titrate up beta blocker however would try to avoid over aggressive titration in the setting of volume overload.   3. AKI - followed by renal, concern for  possible ATN combined with CHF. She has been on abx as well. Only mildly to moderately decreased LVEF, low flow CHF is unlikely.       Dina RichJonathan Branch, M.D.

## 2014-11-30 NOTE — Progress Notes (Signed)
RN updated patient's daughter regarding patient's progress.  Daughter states that she continuously requested Dr. [name] be made the patient's attending physician.

## 2014-11-30 NOTE — Progress Notes (Signed)
Hypoglycemic Event  CBG: 28  Treatment: D50 IV 50 mL  Symptoms: None  Follow-up CBG: ZOXW:[id number]:0612 CBG Result:124  Possible Reasons for Event: Inadequate meal intake  Comments/MD notified: Notified M. Lynch PA.  No new orders received.  Will continue to monitor.    Tina [name], Tina [name]  Remember to initiate Hypoglycemia Order Set & complete

## 2014-12-01 LAB — BASIC METABOLIC PANEL
Anion gap: 11 (ref 5–15)
BUN: 27 mg/dL — ABNORMAL HIGH (ref 6–20)
CALCIUM: 7.7 mg/dL — AB (ref 8.9–10.3)
CHLORIDE: 110 mmol/L (ref 101–111)
CO2: 19 mmol/L — AB (ref 22–32)
Creatinine, Ser: 3.09 mg/dL — ABNORMAL HIGH (ref 0.44–1.00)
GFR calc Af Amer: 15 mL/min — ABNORMAL LOW (ref >60)
GFR calc non Af Amer: 13 mL/min — ABNORMAL LOW (ref >60)
Glucose, Bld: 42 mg/dL — CL (ref 65–99)
POTASSIUM: 3 mmol/L — AB (ref 3.5–5.1)
Sodium: 140 mmol/L (ref 135–145)

## 2014-12-01 LAB — GLUCOSE, CAPILLARY
GLUCOSE-CAPILLARY: 105 mg/dL — AB (ref 65–99)
GLUCOSE-CAPILLARY: 125 mg/dL — AB (ref 65–99)
GLUCOSE-CAPILLARY: 37 mg/dL — AB (ref 65–99)
GLUCOSE-CAPILLARY: 65 mg/dL (ref 65–99)
GLUCOSE-CAPILLARY: 85 mg/dL (ref 65–99)
Glucose-Capillary: 17 mg/dL — CL (ref 65–99)
Glucose-Capillary: 33 mg/dL — CL (ref 65–99)
Glucose-Capillary: 96 mg/dL (ref 65–99)
Glucose-Capillary: 94 mg/dL (ref 65–99)
Glucose-Capillary: 29 mg/dL — CL (ref 65–99)
Glucose-Capillary: 42 mg/dL — CL (ref 65–99)
Glucose-Capillary: 120 mg/dL — ABNORMAL HIGH (ref 65–99)

## 2014-12-01 LAB — CBC
HEMATOCRIT: 30.7 % — AB (ref 36.0–46.0)
HEMOGLOBIN: 10.1 g/dL — AB (ref 12.0–15.0)
MCH: 29.7 pg (ref 26.0–34.0)
MCHC: 32.9 g/dL (ref 30.0–36.0)
MCV: 90.3 fL (ref 78.0–100.0)
PLATELETS: 281 10*3/uL (ref 150–400)
RBC: 3.4 MIL/uL — ABNORMAL LOW (ref 3.87–5.11)
RDW: 19.3 % — AB (ref 11.5–15.5)
WBC: 15.8 10*3/uL — AB (ref 4.0–10.5)

## 2014-12-01 LAB — CLOSTRIDIUM DIFFICILE BY PCR: Toxigenic C. Difficile by PCR: NEGATIVE

## 2014-12-01 LAB — MAGNESIUM: MAGNESIUM: 1.8 mg/dL (ref 1.7–2.4)

## 2014-12-01 MED ORDER — POTASSIUM CHLORIDE 20 MEQ PO PACK
40.0000 meq | PACK | Freq: Two times a day (BID) | ORAL | Status: AC
  Filled 2014-12-01 (×2): qty 2

## 2014-12-01 MED ORDER — MAGNESIUM SULFATE 2 GM/50ML IV SOLN
2.0000 g | Freq: Once | INTRAVENOUS | Status: AC
  Administered 2014-12-01: 2 g via INTRAVENOUS
  Filled 2014-12-01: qty 50

## 2014-12-01 MED ORDER — FUROSEMIDE 10 MG/ML IJ SOLN
80.0000 mg | Freq: Two times a day (BID) | INTRAMUSCULAR | Status: DC
  Administered 2014-12-01 – 2014-12-02 (×4): 80 mg via INTRAVENOUS
  Filled 2014-12-01 (×6): qty 8

## 2014-12-01 MED ORDER — PANTOPRAZOLE SODIUM 40 MG PO TBEC
40.0000 mg | DELAYED_RELEASE_TABLET | Freq: Every day | ORAL | Status: DC
  Administered 2014-12-04: 40 mg via ORAL
  Filled 2014-12-01 (×3): qty 1

## 2014-12-01 MED ORDER — DEXTROSE 50 % IV SOLN
INTRAVENOUS | Status: AC
  Administered 2014-12-01: 50 mL
  Filled 2014-12-01: qty 50

## 2014-12-01 MED ORDER — POTASSIUM CHLORIDE 20 MEQ PO PACK: 40.0000 meq | PACK | Freq: Two times a day (BID) | ORAL | Status: DC

## 2014-12-01 MED ORDER — ASPIRIN 81 MG PO CHEW
81.0000 mg | CHEWABLE_TABLET | Freq: Every day | ORAL | Status: DC
  Administered 2014-12-02 – 2014-12-10 (×8): 81 mg via ORAL
  Filled 2014-12-01 (×8): qty 1

## 2014-12-01 MED ORDER — POTASSIUM CHLORIDE 20 MEQ/15ML (10%) PO SOLN
40.0000 meq | Freq: Two times a day (BID) | ORAL | Status: DC
  Filled 2014-12-01 (×2): qty 30

## 2014-12-01 MED ORDER — POTASSIUM CHLORIDE 10 MEQ/100ML IV SOLN
10.0000 meq | INTRAVENOUS | Status: AC
  Administered 2014-12-01 (×4): 10 meq via INTRAVENOUS
  Filled 2014-12-01 (×4): qty 100

## 2014-12-01 NOTE — Progress Notes (Signed)
Hypoglycemic Event  CBG: 29  Treatment: D50 IV 50 mL  Symptoms: Pale, Hungry and Nervous/irritable  Follow-up CBG: Time:1642 CBG Result:125 Possible Reasons for Event: Unknown  Comments/MD notified: Dr. Penelope CoopMyers    Rudean Icenhour A  Remember to initiate Hypoglycemia Order Set & complete

## 2014-12-01 NOTE — Progress Notes (Signed)
Hypoglycemic Event  CBG: 17  Treatment: D50 IV 50 mL  Symptoms: Sweaty  Follow-up CBG Time: 0352 CBG Result:105  Possible Reasons for Event: Inadequate meal intake      Tamim Skog O  Remember to initiate Hypoglycemia Order Set & complete

## 2014-12-01 NOTE — Progress Notes (Signed)
Subjective: Confused.  No SOB  Objective: Vital signs in last 24 hours: Temp:  [97.7 F (36.5 C)-99 F (37.2 C)] 99 F (37.2 C) (06/06 0841) Pulse Rate:  [79-113] 113 (06/06 0841) Resp:  [16-19] 19 (06/06 0841) BP: (105-154)/(50-85) 154/73 mmHg (06/06 0841) SpO2:  [97 %-100 %] 98 % (06/06 0841) Weight:  [223 lb 6.4 oz (101.334 kg)] 223 lb 6.4 oz (101.334 kg) (06/05 2006) Last BM Date: 12/01/14  Intake/Output from previous day: 06/05 0701 - 06/06 0700 In: 1310 [P.O.:970; I.V.:240; IV Piggyback:100] Out: 750 [Urine:750] Intake/Output this shift: Total I/O In: 230 [P.O.:180; IV Piggyback:50] Out: 300 [Urine:300]  Medications Scheduled Meds: . aspirin  81 mg Oral Daily  . cholecalciferol  1,000 Units Oral Daily  . colesevelam  1,875 mg Oral BID WC  . ezetimibe  10 mg Oral q1800  . feeding supplement (PRO-STAT SUGAR FREE 64)  30 mL Oral BID  . furosemide  80 mg Intravenous BID  . heparin  5,000 Units Subcutaneous 3 times per day  . hydrALAZINE  10 mg Oral 3 times per day  . insulin aspart  0-5 Units Subcutaneous QHS  . isosorbide dinitrate  5 mg Oral TID  . metoprolol succinate  12.5 mg Oral Daily  . pantoprazole  40 mg Oral QHS  . piperacillin-tazobactam (ZOSYN)  IV  2.25 g Intravenous 4 times per day  . potassium chloride  40 mEq Oral BID  . potassium chloride  40 mEq Oral BID  . rosuvastatin  20 mg Oral q1800  . senna-docusate  1 tablet Oral BID  . sodium bicarbonate  650 mg Oral TID   Continuous Infusions: . dextrose 20 mL/hr at 11/29/14 2200   PRN Meds:.acetaminophen, albuterol, RESOURCE THICKENUP CLEAR, sodium chloride  PE: General appearance: alert, cooperative and no distress Neck: no JVD Lungs: Clear anteriorly.  No wheeze. Heart: Heart Sounds soft.  regular rate and rhythm, no murmur, Abdomen: +BS, nontender Extremities: 1-2+ LEE and UEE  Pulses: 2+ and symmetric Skin: Warm and dry Neurologic: Grossly normal  Lab Results:   Recent Labs  12/01/14 0606  WBC 15.8*  HGB 10.1*  HCT 30.7*  PLT 281   BMET  Recent Labs  11/29/14 0520 11/29/14 0830 11/30/14 0437 12/01/14 0606  NA 140  --  141 140  K 3.4*  --  2.8* 3.0*  CL 111  --  112* 110  CO2 16*  --  17* 19*  GLUCOSE 34* 63* 29* 42*  BUN 29*  --  28* 27*  CREATININE 2.71*  --  2.88* 3.09*  CALCIUM 7.8*  --  7.6* 7.7*     Assessment/Plan     AKI (acute kidney injury)   FTT (failure to thrive) in adult   HTN (hypertension), benign   Leukocytosis   Encephalopathy   DM type 2 (diabetes mellitus, type 2)   Essential hypertension   UTI (lower urinary tract infection)   Pressure ulcer   Acute on chronic combined systolic and diastolic HF (heart failure)   Confusion   Acute systolic heart failure  81 yo female with PMH of HTN, DM, dementia, CKD and PAD who recently have laminectoy in March 2016 with subsequent fall and femoral neck fracture s/p ORIF. She has been living in SNF.   Her acute on chronic renal insufficiency was initially felt to be prerenal secondary to dehydration. While receiving IV fluid in the ED, she had spontaneous flash pulmonary edema. She subsequently received IV Lasix   1.  Acute systolic heart failure - echo LVEF 35-40%, indeterminate diastolic function, mild to mod MR - Net fluids: +0.6L.   Cr and BUN trending up, though suspicion of potential ATN according to renal notes - CXR 11/27/14 with mild edema   CXR 6/5: Stable appearance of mild congestive heart failure, with small bilateral pleural effusions and bibasilar atelectasis. - weights 220 on 6/1, 226 6/2, 6/4 228, today 223.    - Hypoalbuminemia is not helping with edema and diuresis.  Intravascularly, I don't know how volume overloaded she is.  CXR with mild CHF and previous BNP 752.(11/28/14).  Diuresis is complicated by her AKI, we will defer diuretic dosing to renal, currently she is off diuretics.  - started low dose Toprol XL 6/4. Avoid ACE/ARB in setting of AKI. We will try low  dose hydral/nitrates for afterload reduction.   2. PSVT - started Toprol XL yesterday - significant hypokalemia and hypomag today, please keep K at 4 and Mg at 2 - would focus on normalizing electrolytes, if PSVT episodes continue can titrate up beta blocker however would try to avoid over aggressive titration in the setting of volume overload.   3.  PAF Appears she was having some afib RVR yesterday on tele.  She also has periods of PACs.  CHADSVASC 7.  I do not think she is an anticoag candidate.    SR now.   4. AKI - followed by renal, concern for possible ATN combined with CHF. She has been on abx as well. Only mildly to moderately decreased LVEF, low flow CHF is unlikely.  5.  Hypomagnesemia:  Improved to 1.8  6:  Hypokalemia:  Continue to replace    LOS: 5 days    HAGER, BRYAN PA-C 12/01/2014 1:02 PM  Tina [name]

## 2014-12-01 NOTE — Progress Notes (Signed)
Received message from last nights charge RN regarding daughters dissatisfaction with patient not being transferred to the care of Dr. Pearson GrippeJames Kim. Called Daughter [name] via phone at [phone] to discuss with her. She stated she has already spoken with Dr. [name] and that he was agreeable to taking her as his patient while in the hospital. She stated her concern was that the patient had been being treated byDr. [name] during the extended illness over 4 months and she was most comfortable with him continuing to treat her.   Text paged and received call back from [name] made aware of the above and text paged her the daughters contact information for her to call her.   Tina [name], Tina Qualeamara Johnson

## 2014-12-01 NOTE — Progress Notes (Addendum)
Received consult for TPN per pharmacy for reasons of poor oral intake with hypoglycemia. Noted patient is on D10 @ 5620mL/hr, as well as she was on a dysphagia 3 diet prior to today when she was refusing PO intake. She does not have a feeding tube, and it does not appear that she has any reason for her GI tract to not be functioning. TPN carries a significant amount of risk including electrolyte abnormalities, blood stream infections, and refeeding syndrome.  Spoke with Dr. Izola PriceMyers about the above, and she stated the family (daughter is Charity fundraiserN) was thinking TPN would be easiest for the patient, and did not want to place a feeding tube or pursue oral feeds. Patient has been refusing PO intake for ~5 days per Dr. Izola PriceMyers.  Note patient has had a PICC since 10/29/14 per chart records, confirmed with RN that this is still in place.  At this point in the day, it is too late for a TPN bag to be made to be hung tonight. Will order TPN labs and a dietician consult, and we will follow up tomorrow to see if there is another option for this patient.   Saylee Sherrill D. Aleynah Rocchio, PharmD, BCPS Clinical Pharmacist Pager: [phone] 12/01/2014 4:09 PM

## 2014-12-01 NOTE — Progress Notes (Signed)
S: Appetite poor.  Denies pain O:BP 154/73 mmHg  Pulse 113  Temp(Src) 99 F (37.2 C) (Axillary)  Resp 19  Ht 5' 5" (1.651 m)  Wt 101.334 kg (223 lb 6.4 oz)  BMI 37.18 kg/m2  SpO2 98%  Intake/Output Summary (Last 24 hours) at 12/01/14 1000 Last data filed at 12/01/14 0841  Gross per 24 hour  Intake   1300 ml  Output    800 ml  Net    500 ml   Weight change: -2.087 kg (-4 lb 9.6 oz) NOM:[facility] and alert CVS:RRR Resp: Clear ant Abd:+ BS NTND Ext: + edema NEURO:CNI, O only to person, No asterixis   . aspirin EC  81 mg Oral Daily  . cholecalciferol  1,000 Units Oral Daily  . colesevelam  1,875 mg Oral BID WC  . ezetimibe  10 mg Oral q1800  . feeding supplement (PRO-STAT SUGAR FREE 64)  30 mL Oral BID  . furosemide  60 mg Intravenous BID  . heparin  5,000 Units Subcutaneous 3 times per day  . hydrALAZINE  10 mg Oral 3 times per day  . insulin aspart  0-5 Units Subcutaneous QHS  . isosorbide dinitrate  5 mg Oral TID  . metoprolol succinate  12.5 mg Oral Daily  . pantoprazole (PROTONIX) IV  40 mg Intravenous Q24H  . piperacillin-tazobactam (ZOSYN)  IV  2.25 g Intravenous 4 times per day  . potassium chloride  40 mEq Oral BID  . rosuvastatin  20 mg Oral q1800  . senna-docusate  1 tablet Oral BID  . sodium bicarbonate  650 mg Oral TID   Dg Chest Port 1 View  11/30/2014   CLINICAL DATA:  Fever. Leukocytosis. Acute kidney injury. Congestive heart failure.  EXAM: PORTABLE CHEST - 1 VIEW  COMPARISON:  11/27/2014  FINDINGS: Left arm PICC line remains in appropriate position.  Diffuse interstitial infiltrates show no significant change, suspicious for mild interstitial edema. Mild cardiomegaly remains stable. Small bilateral pleural effusions and bibasilar atelectasis also show no significant change.  IMPRESSION: Stable appearance of mild congestive heart failure, with small bilateral pleural effusions and bibasilar atelectasis.   Electronically Signed   By: Earle Gell M.D.   On:  11/30/2014 13:05   BMET    Component Value Date/Time   NA 140 12/01/2014 0606   K 3.0* 12/01/2014 0606   CL 110 12/01/2014 0606   CO2 19* 12/01/2014 0606   GLUCOSE 42* 12/01/2014 0606   BUN 27* 12/01/2014 0606   CREATININE 3.09* 12/01/2014 0606   CALCIUM 7.7* 12/01/2014 0606   GFRNONAA 13* 12/01/2014 0606   GFRAA 15* 12/01/2014 0606   CBC    Component Value Date/Time   WBC 15.8* 12/01/2014 0606   RBC 3.40* 12/01/2014 0606   RBC 3.68* 11/26/2014 0913   HGB 10.1* 12/01/2014 0606   HCT 30.7* 12/01/2014 0606   HCT 24.7* 09/29/2014 0542   PLT 281 12/01/2014 0606   MCV 90.3 12/01/2014 0606   MCH 29.7 12/01/2014 0606   MCHC 32.9 12/01/2014 0606   RDW 19.3* 12/01/2014 0606   LYMPHSABS 1.8 11/27/2014 0707   MONOABS 1.5* 11/27/2014 0707   EOSABS 0.9* 11/27/2014 0707   BASOSABS 0.2* 11/27/2014 0707     Assessment: 1. ARF ? Secondary to vanco, ? interstitial nephritis as had some pyuria and mild eosinophilia.  Now off vanco.  UO improving, Scr sl up 2. Hypokalemia 3. Met acidosis on bicarb 4. Hypomag, SP replacement  Plan: 1. Cont IV lasix 2. I  would DC antibiotics if nothing specific is being treated 3. KCL prn  Give 64mg x 2 today  , T

## 2014-12-01 NOTE — Progress Notes (Signed)
Writer received a call from the patients daughter Markus Jarvis(Andria) this a.m. approximately 0100 regarding attending physician issue that has been noted previously.  Followed-up w/ daughter tonight; she is happy that the issue has been resolved.  She wanted to express that she was not unhappy with the physicians who have been attending her mother.  Her physician request is based on the fact the Dr. [name] has been following her mother outside the hospital and is familiar with her condition.

## 2014-12-01 NOTE — Progress Notes (Signed)
Patient ID: Tina [name] Tricarico, female   DOB: 06/14/1931, 81 y.o.   MRN: [id number] [facility] PROGRESS NOTE  Tina [name] Danser [id number] DOB: 01/20/1931 DOA: 11/26/2014 PCP: Fredirick MaudlinHAWKINS,EDWARD L, MD  Brief narrative:    81 year old female with past medical history of dementia, diabetes, hypertension, neurogenic claudication, s/p laminectomy by Dr Jeral FruitBotero in March 2016, subsequently fell and had left subcapital femoral neck fracture, has had ORIF (done by Dr. Margarita Ranaimothy Murphy) and then sent to SNF. While there, she was found to have leukocytosis and increasing creatinine.  She was admitted to [facility] and was under Dr.Hawkins care but per family request was [facility]. She was found to have new elevation in creatinine, 2.14 on admission (back in 09/2014 her Cr was WNL but in 08/2014 it was 1.38). She was also found to have acute systolic and diastolic CHF and was placed on lasix 80 mg IV Q 12 hours. She has been seen by renal while in [facility]. She was initially on rocephin but this was changed to zosyn while in [facility] since there was also [name] concern that she may be aspirating. SLP consulted.  Barrier to discharge: worsening renal failure for which reason renal was consulted. Also needs SLP evaluation to see if current diet is adequate for her. She has significant cough this am with swallowing. She is on zosyn for possible aspiration. Also, she is receiving IV lasix for acute CHF. Cardiology and nephrology teams both following.   Please note that Dr. [name] has graciously agreed to take over the pt's care from 12/02/2014 per family request.   Assessment/Plan:    Principal Problem: Acute encephalopathy - Likely combination of dementia, UTI and possible aspiration, progressive failure to thrive, poor oral intake   - Pt rather confused this AM, following most of the simple commands  - PT evaluation completed 11/28/2014 and recommendation is to d/c to SNF once medically stable   Active Problems:   Acute on chronic kidney disease, stage 4 / Metabolic acidosis  - Likely from cardiorenal mechanisms with CHF exacerbation with evolution to ATN per nephrology team, appreciate renal consult and recommendations - metabolic acidosis secondary to acute renal failure, continue sodium bicarb supplementation  - She received IV fluids on admission but due to LE edema and evidence of CHF, IV fluids stopped, 11/28/2014. - per nephrology team, no changes in medical regimen for now, continue same diuretic regimen with Lasix 80 mg IV BID - avoid ACEI/ARB's until renal function stabilizes  - monitor daily weights, I/O   Acute on chronic combined systolic and diastolic CHF - 2 D ECHO on this admission showed EF of 35-40%. In addition, has LE pitting edema at least +1-2. - The following is the weight trend since 11/26/2014: 101.5 kg --> 100.2 kg --> 102.6 kg --> 103 kg --> 101.3 kg - CXR 11/27/14 with mild edema - CXR 6/5: Stable appearance of mild congestive heart failure, with small bilateral pleural effusions and bibasilar atelectasis. - Daily weight and strict intake and output to be monitored  - Replete electrolytes    PSVT - started Toprol XL 11/30/14, rate better controlled  - supplement K and Mg and try to keep K at 4 and Mg at 2   PAF - some afib RVR on tele 11/30/14 - CHADSVASC 7, per cardiology pt is not an anticoag candidate.   HTN (hypertension), benign - reasonable inpatient control  - Currently receiving lasix 80 mg IV BID   Hypokalemia, hypomagnesemia  -  Due to lasix, also noted low mag - supplement both electrolytes    Aspiration pneumonitis / UTI / Leukocytosis - Leukocytosis likely due to UTI and possible aspiration pneumonia - Initially on rocephin but changed to zosyn to cover for both, UTI and aspiration pneumonitis - Aspiration precaution, continue Zosyn day #6 and stop after the dose today  - SLP evaluation completed, dys III diet recommended but pt with minimal oral intake - TPN  per pharmacy requested    DM type 2 (diabetes mellitus, type 2) with renal manifestations and severe hypoglycemia  - A1c in 08/2014 5.8 indicating good glycemic control - D/C'ed PO antihyperglycemics while aspiration precaution - stop SSI as well - DM coordinator consulted, recommendations appreciated    Anemia of chronic kidney disease - Hemoglobin overall stable, no signs of active bleeding  - Stable    Dyslipidemia - Continue crestor   Pressure ulcer stage 1 - Intact skin with non-blanchable redness  - Care per floor RN   Morbid obesity - pt meets criteria with BMI > 37 and underlying risk factors of HTN, HLD - Body mass index is 37.18 kg/(m^2).    Severe PCM - in the context of acute illness, no oral intake over 3 days - nutritionist consulted    DVT Prophylaxis  - Heparin subQ   Code Status: Full.  Family Communication:  plan of care discussed with the patient Disposition Plan: SNF once medically stable   IV access:  Peripheral IV  Procedures and diagnostic studies:    TTE 11/27/14 -  EF 35 -40%, indeterminate diastolic function   Dg Chest 1 View 11/27/2014 Study is limited by poor inspiration. Persistent mild congestion/ interstitial edema. Left arm PICC line with tip at the junction of SVC with brachiocephalic vein is unchanged in position. There is no pneumothorax. Mild basilar atelectasis. Question small left pleural effusion.     Ct Head Wo Contrast 11/27/2014   No acute intracranial abnormality. Stable atrophy and chronic white matter disease. Stable lacunar infarct in left corona radiata. No definite acute cortical infarction.     US Renal 11/26/2014   Study within normal limits.    Dg Chest Port 1 View 11/26/2014  1. Findings consistent with CHF. 2. Tip of the left upper extremity PICC in the proximal SVC/distal brachiocephalic vein.    Medical Consultants:  Nephrology - Dr. Allena Katz Cardiology  Other Consultants:  SLP Physical therapy Diabetic  coordinator  IAnti-Infectives:   Zosyn 11/26/2014 --> 12/01/2014  Debbora Presto, MD  [facility] Pager [phone]  Time spent in minutes: 25 minutes  If 7PM-7AM, please contact night-coverage [email or web address] Password [email or web address] 12/01/2014, 3:44 PM   LOS: 5 days    HPI/Subjective: No acute overnight events. Patient reports chills this AM, poor oral intake.   Objective: Filed Vitals:   11/30/14 1717 11/30/14 2006 12/01/14 0541 12/01/14 0841  BP: 125/50 116/60 105/85 154/73  Pulse: 94 79 101 113  Temp: 97.7 F (36.5 C) 98 F (36.7 C) 98 F (36.7 C) 99 F (37.2 C)  TempSrc: Oral Oral Oral Axillary  Resp: 18 16 18 19   Height:      Weight:  101.334 kg (223 lb 6.4 oz)    SpO2: 98% 100% 97% 98%    Intake/Output Summary (Last 24 hours) at 12/01/14 1544 Last data filed at 12/01/14 1510  Gross per 24 hour  Intake 1103.33 ml  Output    600 ml  Net 503.33 ml    Exam:   General:  Pt  is alert, follows commands appropriately, not in acute distress  Cardiovascular: Regular rate and rhythm, S1/S2 (+)  Respiratory: Clear to auscultation bilaterally, no wheezing, diminished breath sounds at bases with bibasilar crackles   Abdomen: Soft, non tender, non distended, bowel sounds present  Extremities: +2 LE pitting edema, pulses DP and PT palpable bilaterally  Neuro: Grossly nonfocal  Data Reviewed: Basic Metabolic Panel:  Recent Labs Lab 11/26/14 0228 11/27/14 0707 11/28/14 1845 11/29/14 0520 11/29/14 0830 11/30/14 0437 12/01/14 0606  NA 143 141 139 140  --  141 140  K 3.3* 3.8 3.9 3.4*  --  2.8* 3.0*  CL 117* 114* 111 111  --  112* 110  CO2 16* 15* 16* 16*  --  17* 19*  GLUCOSE 159* 153* 46* 34* 63* 29* 42*  BUN 28* 28* 27* 29*  --  28* 27*  CREATININE 2.14* 2.24* 2.62* 2.71*  --  2.88* 3.09*  CALCIUM 8.5* 8.3* 7.8* 7.8*  --  7.6* 7.7*  MG 1.6*  --   --   --   --  1.3* 1.8  PHOS 3.5  --  3.3  --   --   --   --    Liver Function Tests:  Recent Labs Lab  11/26/14 0228 11/28/14 1845  AST 13*  --   ALT 11*  --   ALKPHOS 107  --   BILITOT 0.6  --   PROT 5.4*  --   ALBUMIN 2.1* 1.7*   CBC:  Recent Labs Lab 11/26/14 0228 11/27/14 0707 12/01/14 0606  WBC 17.4* 17.9* 15.8*  NEUTROABS 14.2* 13.6*  --   HGB 10.5* 10.8* 10.1*  HCT 32.4* 33.7* 30.7*  MCV 92.0 94.1 90.3  PLT 483* 453* 281   Cardiac Enzymes:  Recent Labs Lab 11/26/14 0913  CKTOTAL 25*  CKMB 5.7*   BNP: Invalid input(s): POCBNP CBG:  Recent Labs Lab 12/01/14 0602 12/01/14 0704 12/01/14 0757 12/01/14 1144 12/01/14 1243  GLUCAP 33* 96 65 37* 94    Recent Results (from the past 240 hour(s))  Urine culture     Status: None   Collection Time: 11/26/14  2:00 AM  Result Value Ref Range Status   Specimen Description URINE, CATHETERIZED  Final   Special Requests NONE  Final   Culture NO GROWTH  Final   Report Status 11/27/2014 FINAL  Final  MRSA PCR Screening     Status: None   Collection Time: 11/26/14 12:00 PM  Result Value Ref Range Status   MRSA by PCR NEGATIVE NEGATIVE Final    . aspirin  81 mg Oral Daily  . cholecalciferol  1,000 Units Oral Daily  . colesevelam  1,875 mg Oral BID WC  . ezetimibe  10 mg Oral q1800  . feeding supplement (PRO-STAT SUGAR FREE 64)  30 mL Oral BID  . furosemide  80 mg Intravenous BID  . heparin  5,000 Units Subcutaneous 3 times per day  . hydrALAZINE  10 mg Oral 3 times per day  . insulin aspart  0-5 Units Subcutaneous QHS  . isosorbide dinitrate  5 mg Oral TID  . metoprolol succinate  12.5 mg Oral Daily  . pantoprazole  40 mg Oral QHS  . piperacillin-tazobactam (ZOSYN)  IV  2.25 g Intravenous 4 times per day  . potassium chloride  40 mEq Oral BID  . potassium chloride  40 mEq Oral BID  . rosuvastatin  20 mg Oral q1800  . senna-docusate  1 tablet Oral BID  .  sodium bicarbonate  650 mg Oral TID     Continuous Infusions: . dextrose 20 mL/hr at 12/01/14 1336

## 2014-12-01 NOTE — Progress Notes (Signed)
SLP Cancellation Note  Patient Details Name: [name] Dadisman MRN: [id number] DOB: 06/15/1931   Cancelled treatment:       Reason Eval/Treat Not Completed: Patient adamantly declining all PO intake at this time, requesting to be left alone, despite SLP encouragement and education relating to swallowing concerns and aspiration risks. Will continue to follow as able.   Maxcine HamLaura Paiewonsky, M.A. CCC-SLP [phone]  Maxcine Hamaiewonsky, Georgenia Salim 12/01/2014, 12:14 PM

## 2014-12-01 NOTE — Progress Notes (Signed)
Late entry for 1200. Pt CBG 37. Pt alert and talking to this nurse at the time of cbg testing. D50 50 mL given IV. CBG reassessed 94. Dr. Izola PriceMyers notified of patient refusing to eat. Continue D10 at 20 mL/hr via PICC, and administer D50 as needed for decreased cbg. Will continue to monitor. Bess KindsGWALTNEY, Nealy Hickmon B, RN

## 2014-12-02 DIAGNOSIS — G9340 Encephalopathy, unspecified: Secondary | ICD-10-CM

## 2014-12-02 LAB — COMPREHENSIVE METABOLIC PANEL
ALT: 14 U/L (ref 14–54)
AST: 22 U/L (ref 15–41)
Albumin: 1.6 g/dL — ABNORMAL LOW (ref 3.5–5.0)
Alkaline Phosphatase: 83 U/L (ref 38–126)
Anion gap: 12 (ref 5–15)
BILIRUBIN TOTAL: 0.3 mg/dL (ref 0.3–1.2)
BUN: 29 mg/dL — ABNORMAL HIGH (ref 6–20)
CALCIUM: 7.8 mg/dL — AB (ref 8.9–10.3)
CHLORIDE: 108 mmol/L (ref 101–111)
CO2: 19 mmol/L — AB (ref 22–32)
CREATININE: 3.19 mg/dL — AB (ref 0.44–1.00)
GFR calc Af Amer: 15 mL/min — ABNORMAL LOW (ref >60)
GFR calc non Af Amer: 13 mL/min — ABNORMAL LOW (ref >60)
GLUCOSE: 41 mg/dL — AB (ref 65–99)
POTASSIUM: 3.2 mmol/L — AB (ref 3.5–5.1)
Sodium: 139 mmol/L (ref 135–145)
TOTAL PROTEIN: 4.4 g/dL — AB (ref 6.5–8.1)

## 2014-12-02 LAB — GLUCOSE, CAPILLARY
GLUCOSE-CAPILLARY: 93 mg/dL (ref 65–99)
GLUCOSE-CAPILLARY: 130 mg/dL — AB (ref 65–99)
Glucose-Capillary: 95 mg/dL (ref 65–99)
Glucose-Capillary: 104 mg/dL — ABNORMAL HIGH (ref 65–99)

## 2014-12-02 LAB — TRIGLYCERIDES: Triglycerides: 182 mg/dL — ABNORMAL HIGH (ref <150)

## 2014-12-02 LAB — DIFFERENTIAL
BASOS PCT: 0 % (ref 0–1)
Basophils Absolute: 0 10*3/uL (ref 0.0–0.1)
Eosinophils Absolute: 4 10*3/uL — ABNORMAL HIGH (ref 0.0–0.7)
Eosinophils Relative: 27 % — ABNORMAL HIGH (ref 0–5)
LYMPHS ABS: 1.2 10*3/uL (ref 0.7–4.0)
Lymphocytes Relative: 8 % — ABNORMAL LOW (ref 12–46)
MONO ABS: 0.7 10*3/uL (ref 0.1–1.0)
Monocytes Relative: 5 % (ref 3–12)
NEUTROS ABS: 9 10*3/uL — AB (ref 1.7–7.7)
Neutrophils Relative %: 60 % (ref 43–77)

## 2014-12-02 LAB — CBC
HCT: 29.9 % — ABNORMAL LOW (ref 36.0–46.0)
Hemoglobin: 9.9 g/dL — ABNORMAL LOW (ref 12.0–15.0)
MCH: 30.2 pg (ref 26.0–34.0)
MCHC: 33.1 g/dL (ref 30.0–36.0)
MCV: 91.2 fL (ref 78.0–100.0)
PLATELETS: 270 10*3/uL (ref 150–400)
RBC: 3.28 MIL/uL — ABNORMAL LOW (ref 3.87–5.11)
RDW: 19.4 % — AB (ref 11.5–15.5)
WBC: 14.9 10*3/uL — ABNORMAL HIGH (ref 4.0–10.5)

## 2014-12-02 LAB — PREALBUMIN: Prealbumin: 27 mg/dL (ref 18–38)

## 2014-12-02 LAB — MAGNESIUM: Magnesium: 2.3 mg/dL (ref 1.7–2.4)

## 2014-12-02 MED ORDER — POTASSIUM CHLORIDE 10 MEQ/100ML IV SOLN
10.0000 meq | Freq: Once | INTRAVENOUS | Status: AC
  Administered 2014-12-02: 10 meq via INTRAVENOUS
  Filled 2014-12-02: qty 100

## 2014-12-02 MED ORDER — DEXTROSE 50 % IV SOLN
INTRAVENOUS | Status: AC
  Administered 2014-12-02: 50 mL
  Filled 2014-12-02: qty 50

## 2014-12-02 MED ORDER — POTASSIUM CHLORIDE 10 MEQ/100ML IV SOLN
10.0000 meq | INTRAVENOUS | Status: AC
  Administered 2014-12-02 (×2): 10 meq via INTRAVENOUS
  Filled 2014-12-02 (×2): qty 100

## 2014-12-02 MED ORDER — ROSUVASTATIN CALCIUM 5 MG PO TABS
5.0000 mg | ORAL_TABLET | Freq: Every day | ORAL | Status: DC
  Administered 2014-12-02 – 2014-12-09 (×5): 5 mg via ORAL
  Filled 2014-12-02 (×9): qty 1

## 2014-12-02 MED ORDER — PRO-STAT SUGAR FREE PO LIQD
30.0000 mL | Freq: Three times a day (TID) | ORAL | Status: DC
Start: 2014-12-02 — End: 2014-12-04
  Administered 2014-12-02: 30 mL via ORAL
  Filled 2014-12-02 (×6): qty 30

## 2014-12-02 MED ORDER — ENSURE ENLIVE PO LIQD
237.0000 mL | Freq: Three times a day (TID) | ORAL | Status: DC
  Administered 2014-12-02 – 2014-12-03 (×2): 237 mL via ORAL

## 2014-12-02 MED ORDER — POTASSIUM CHLORIDE 10 MEQ/100ML IV SOLN
10.0000 meq | Freq: Once | INTRAVENOUS | Status: DC
Start: 2014-12-02 — End: 2014-12-03
  Filled 2014-12-02: qty 100

## 2014-12-02 NOTE — Progress Notes (Signed)
Subjective: Afebrile overnite CHF (ef 35-40%)  I/o net -81,  Pt denies cp, palp, sob.  ARF:  Followed by nephrology, on iv lasix,  Hypoglycemia: on D10 Hypokalemia:  On potassium,  Magnesium level pending.  Encephalopathy: much improved.  Pt is able to converse.    Objective: Vital signs in last 24 hours: Temp:  [98.2 F (36.8 C)-99.2 F (37.3 C)] 98.7 F (37.1 C) (06/07 0437) Pulse Rate:  [85-113] 101 (06/07 0437) Resp:  [18-22] 20 (06/07 0437) BP: (118-154)/(35-73) 134/59 mmHg (06/07 0437) SpO2:  [95 %-98 %] 98 % (06/07 0437) Weight:  [103.874 kg (229 lb)] 103.874 kg (229 lb) (06/06 2056) Weight change: 2.54 kg (5 lb 9.6 oz) Last BM Date: 12/01/14  Intake/Output from previous day: 06/06 0701 - 06/07 0700 In: 1293.3 [P.O.:360; I.V.:433.3; IV Piggyback:500] Out: 1375 [Urine:1375] Intake/Output this shift:    Heent: anicteric Neck: no jvd Heart: rrr s1, s2 Lung: ctab Abd: soft, morbidly obese, nt, +bs Ext: no c/c/trace edema  Lab Results:  Recent Labs  12/01/14 0606  WBC 15.8*  HGB 10.1*  HCT 30.7*  PLT 281   BMET  Recent Labs  12/01/14 0606 12/02/14 0440  NA 140 139  K 3.0* 3.2*  CL 110 108  CO2 19* 19*  GLUCOSE 42* 41*  BUN 27* 29*  CREATININE 3.09* 3.19*  CALCIUM 7.7* 7.8*    Studies/Results: Dg Chest Port 1 View  11/30/2014   CLINICAL DATA:  Fever. Leukocytosis. Acute kidney injury. Congestive heart failure.  EXAM: PORTABLE CHEST - 1 VIEW  COMPARISON:  11/27/2014  FINDINGS: Left arm PICC line remains in appropriate position.  Diffuse interstitial infiltrates show no significant change, suspicious for mild interstitial edema. Mild cardiomegaly remains stable. Small bilateral pleural effusions and bibasilar atelectasis also show no significant change.  IMPRESSION: Stable appearance of mild congestive heart failure, with small bilateral pleural effusions and bibasilar atelectasis.   Electronically Signed   By: Myles RosenthalJohn  Stahl M.D.   On: 11/30/2014 13:05     Medications: I have reviewed the patient's current medications.  Assessment/Plan:  Acute on CRF Secondary to ATN Pt is being followed by nephrology, creatinine is trending up.  Zosyn has been stopped.   Pt remains on lasix 80mg  iv bid Metabolic acidosis on bicarbonate  Hypokalemia On potassium,  Will give slight additional potassium today.   Leukocytosis ? Due to uti vs aspiration pneumonia First day off zosyn today.   PSVT Started on toprol xl 11/30/2014, rate improved.   Severe protein calorie malnutrition TPN to be started today, appreciate pharmacy input  Hypoglycemia Hopefully this will improve with nutrition, TPN  Pafib (CHADSVASC 7) Appreciate cardiology input.   CHF (EF 35-45%) Appreciate cardiology input  Dm2 Hga1c=5.8  Hyperlipidemia Decrease crestor due to renal insufficiency  Aspiration pneumonia Speech therapy Off abx today Will monitor  Anemia Repeat cbc in am  DVT prophylaxis: heparin [place]  Acute encephalopathy resolved, secondary to combination of dementia, aspiration and FTT   LOS: 6 days   Tina [name], Tina [name] 12/02/2014, 7:27 AM

## 2014-12-02 NOTE — Progress Notes (Signed)
PARENTERAL NUTRITION CONSULT NOTE - INITIAL  Pharmacy Consult for TPN Indication: refusal of po intake ~6 days  No Known Allergies  Patient Measurements: Height: 5' 5"  (165.1 cm) Weight: 229 lb (103.874 kg) IBW/kg (Calculated) : 57 Adjusted Body Weight: 69kg  Vital Signs: Temp: 98.9 F (37.2 C) (06/07 0917) Temp Source: Oral (06/07 0917) BP: 125/62 mmHg (06/07 0917) Pulse Rate: 99 (06/07 0917) Intake/Output from previous day: 06/06 0701 - 06/07 0700 In: 1293.3 [P.O.:360; I.V.:433.3; IV Piggyback:500] Out: 1375 [Urine:1375] Intake/Output from this shift: Total I/O In: 60 [P.O.:60] Out: 400 [Urine:400]  Labs:  Recent Labs  12/01/14 0606 12/02/14 0440  WBC 15.8* 14.9*  HGB 10.1* 9.9*  HCT 30.7* 29.9*  PLT 281 270     Recent Labs  11/30/14 0437 12/01/14 0606 12/02/14 0440  NA 141 140 139  K 2.8* 3.0* 3.2*  CL 112* 110 108  CO2 17* 19* 19*  GLUCOSE 29* 42* 41*  BUN 28* 27* 29*  CREATININE 2.88* 3.09* 3.19*  CALCIUM 7.6* 7.7* 7.8*  MG 1.3* 1.8 2.3  PROT  --   --  4.4*  ALBUMIN  --   --  1.6*  AST  --   --  22  ALT  --   --  14  ALKPHOS  --   --  83  BILITOT  --   --  0.3  PREALBUMIN  --   --  27.0  TRIG  --   --  182*   Estimated Creatinine Clearance: 16.6 mL/min (by C-G formula based on Cr of 3.19).    Recent Labs  12/01/14 2109 12/01/14 2146 12/02/14 0802  GLUCAP 42* 120* 93    Medical History: Past Medical History  Diagnosis Date  . Diabetes mellitus   . Hypertension   . Osteopenia   . Peripheral vascular disease, unspecified   . GERD (gastroesophageal reflux disease)   . Arthritis   . Shortness of breath     exersion  . Pneumonia     child  . Anemia     hx  . Fall at home October 09, 2013  . DM (diabetes mellitus) type II uncontrolled, periph vascular disorder 09/17/2014  . Hepatitis     "a"    Insulin Requirements in the past 24 hours:  SSI ordered - not yet used  Assessment: 37 yof admitted from SNF for elevated SCr and  leukocytosis. Pharmacy was consulted to initiate TPN for reasons of poor oral intake and hypoglycemia. Noted patient is on D10@50  as well as dysphagia 3 diet and has refused oral intake for ~6 days. Patient does not have feeding tube and GI tract intact. Per note, Dr. Doyle Askew originally stated the family (daughter is RN) was thinking TPN would be easiest for patient and did not want to place feeding tube or pursue oral feeds. Spoke with Dr. Maudie Mercury this AM to discuss reconsidering a PEG tube and encouraging po intake due to significant risks associated with TPN. Dr. Maudie Mercury to follow-up with family on a decision.  GI: Prealbumin wnl 27. Progressive FTT per MD. Poor po intake - reported pt ate 10% of meal today. SLP consulted. Vit d, ppi, senna-docusate Endo: DM. A1c 5.8. Multiple hypoglycemic episodes with D50 given (CBGs in 40s) Lytes: K low 3.2 (k run x1 per MD), others wnl Renal: AKI on admit, SCr up to 3.19, CrCl~17, UOP good 0.6 on lasix. D10@50 . Lasix 75m IV bid, bicarb Cards: HLD, PSVT - rate improved with toprol started, asa, welchol, zetia,  hydralazine, imdur, crestor Hepatobil: LFTs/alk phos/tbili wnl, TG 182 Neuro: acute encephalopathy, dementia ID: s/p 6 days zosyn for UTI vs. Asp pna. cdiff neg. UC ngf. Afebrile, wbc decreased to 14.9 AC: PAF - CHADSVASC 7 - not anticoag candidate per cards Best Practices: heparin [place], ppi po TPN Access: PICC was placed 10/29/14 - still in place per RN TPN start date:   Current Nutrition:  Dysphagia 3 diet Prostat  Nutritional Goals:  1725-1900 kCal, 70-85 grams of protein per day  Plan:  -Per discussion with Dr. Maudie Mercury, hold TPN for now. Dr. Maudie Mercury to follow-up with family and make final decision. -Recommend against TPN as not indicated per ASPEN guidelines. Instead recommend encouragement of oral intake and if necessary, feeding tube placement and start enteral nutrition -Consider scheduled KCl replacement while on scheduled lasix  Elicia Lamp,  PharmD Clinical Pharmacist - Resident Pager [phone] 12/02/2014 10:58 AM

## 2014-12-02 NOTE — Progress Notes (Signed)
S: Appetite poor.  Denies pain O:BP 125/62 mmHg  Pulse 99  Temp(Src) 98.9 F (37.2 C) (Oral)  Resp 19  Ht _0  (1.651 m)  Wt 103.874 kg (229 lb)  BMI 38.11 kg/m2  SpO2 98%  Intake/Output Summary (Last 24 hours) at 12/02/14 1055 Last data filed at 12/02/14 0918  Gross per 24 hour  Intake 1123.33 ml  Output   1475 ml  Net -351.67 ml   Weight change: 2.54 kg (5 lb 9.6 oz) TIW:PYKDX and alert CVS:RRR Resp: Clear ant Abd:+ BS NTND Ext: + edema NEURO:CNI, O only to person, No asterixis   . aspirin  81 mg Oral Daily  . cholecalciferol  1,000 Units Oral Daily  . colesevelam  1,875 mg Oral BID WC  . ezetimibe  10 mg Oral q1800  . feeding supplement (PRO-STAT SUGAR FREE 64)  30 mL Oral BID  . furosemide  80 mg Intravenous BID  . heparin  5,000 Units Subcutaneous 3 times per day  . hydrALAZINE  10 mg Oral 3 times per day  . insulin aspart  0-5 Units Subcutaneous QHS  . isosorbide dinitrate  5 mg Oral TID  . metoprolol succinate  12.5 mg Oral Daily  . pantoprazole  40 mg Oral QHS  . rosuvastatin  5 mg Oral q1800  . senna-docusate  1 tablet Oral BID  . sodium bicarbonate  650 mg Oral TID   Dg Chest Port 1 View  11/30/2014   CLINICAL DATA:  Fever. Leukocytosis. Acute kidney injury. Congestive heart failure.  EXAM: PORTABLE CHEST - 1 VIEW  COMPARISON:  11/27/2014  FINDINGS: Left arm PICC line remains in appropriate position.  Diffuse interstitial infiltrates show no significant change, suspicious for mild interstitial edema. Mild cardiomegaly remains stable. Small bilateral pleural effusions and bibasilar atelectasis also show no significant change.  IMPRESSION: Stable appearance of mild congestive heart failure, with small bilateral pleural effusions and bibasilar atelectasis.   Electronically Signed   By: Earle Gell M.D.   On: 11/30/2014 13:05   BMET    Component Value Date/Time   NA 139 12/02/2014 0440   K 3.2* 12/02/2014 0440   CL 108 12/02/2014 0440   CO2 19* 12/02/2014  0440   GLUCOSE 41* 12/02/2014 0440   BUN 29* 12/02/2014 0440   CREATININE 3.19* 12/02/2014 0440   CALCIUM 7.8* 12/02/2014 0440   GFRNONAA 13* 12/02/2014 0440   GFRAA 15* 12/02/2014 0440   CBC    Component Value Date/Time   WBC 14.9* 12/02/2014 0440   RBC 3.28* 12/02/2014 0440   RBC 3.68* 11/26/2014 0913   HGB 9.9* 12/02/2014 0440   HCT 29.9* 12/02/2014 0440   HCT 24.7* 09/29/2014 0542   PLT 270 12/02/2014 0440   MCV 91.2 12/02/2014 0440   MCH 30.2 12/02/2014 0440   MCHC 33.1 12/02/2014 0440   RDW 19.4* 12/02/2014 0440   LYMPHSABS 1.2 12/02/2014 0440   MONOABS 0.7 12/02/2014 0440   EOSABS 4.0* 12/02/2014 0440   BASOSABS 0.0 12/02/2014 0440     Assessment: 1. ARF ? Secondary to vanco, ? interstitial nephritis as had some pyuria and mild eosinophilia.  Now off vanco.  UO improving, Scr sl up again but rate of rise sl less 2. Hypokalemia 3. Met acidosis on bicarb 4. Hypomag, SP replacement   Plan: 1. Replace K, she will need more than 1 run of K 2. I would DC IV of D5W and use D50 prn as she is already volume overloaded.   3.  I think her code status should be reevaluated 4. Recheck Scr in AM Tina [name] T

## 2014-12-02 NOTE — Progress Notes (Signed)
Speech Language Pathology Treatment: Dysphagia  Patient Details Name: Tina [name] MRN: [id number] DOB: 07/14/1930 Today's Date: 12/02/2014 Time: [id number] SLP Time Calculation (min) (ACUTE ONLY): 13 min  Assessment / Plan / Recommendation Clinical Impression  Pt is still confused with limited participation, although agreeable to trials of honey thick liquids. She consumed 4 ounces with Mod cues for pacing with no overt signs of aspiration. She was not agreeable to any solid POs. Given limited participation at bedside, question if she would participate in MBS. She appears to be tolerating current textures though, so will continue with these for now. Will continue to follow for tolerance and readiness to advance versus need for objective testing.   HPI Other Pertinent Information: 81 year old female with past medical history of dementia, diabetes, hypertension, neurogenic claudication, s/p laminectomy by Dr Jeral FruitBotero in March 2016, subsequently fell and had left subcapital femoral neck fracture, has had ORIF (done by Dr. Margarita Ranaimothy Murphy) and then sent to SNF. While there, she was found to have leukocytosis and increasing creatinine. Pt was observed coughing with PO intake.   Pertinent Vitals Pain Assessment: No/denies pain  SLP Plan  Continue with current plan of care    Recommendations Diet recommendations: Dysphagia 3 (mechanical soft);Honey-thick liquid Liquids provided via: Cup Medication Administration: Crushed with puree Supervision: Patient able to self feed;Full supervision/cueing for compensatory strategies;Staff to assist with self feeding Compensations: Slow rate;Small sips/bites Postural Changes and/or Swallow Maneuvers: Seated upright 90 degrees       Oral Care Recommendations: Oral care BID Follow up Recommendations: Skilled Nursing facility Plan: Continue with current plan of care    Maxcine HamLaura Paiewonsky, M.A. CCC-SLP [phone]  Maxcine Hamaiewonsky, Joelle [place] 12/02/2014, 12:17  PM

## 2014-12-02 NOTE — Progress Notes (Signed)
Late entry:  Hypoglycemic Event  CBG: 42  Treatment: D50 IV 50 mL  Symptoms: None  Follow-up CBG Time:2146 CBG Result:120  Possible Reasons for Event: Inadequate meal intake  Comments/MD notified:Kirby NP    Anabel BeneQui, Marli Diego  Remember to initiate Hypoglycemia Order Set & complete

## 2014-12-02 NOTE — Progress Notes (Signed)
PT Cancellation Note  Patient Details Name: Tina [name] MRN: [id number] DOB: 09/01/1930   Cancelled Treatment:    Reason Eval/Treat Not Completed: Patient declined, no reason specified;Other (comment) (Talked with her about need to move but refused).  Pt does not understand she should not just lie in bed rather than wait to go back to SNF.   Ivar DrapeStout, Adiba Fargnoli E 12/02/2014, 12:32 PM   Samul Dadauth Tiffanye Hartmann, PT MS Acute Rehab Dept. Number: [facility] [phone] and [facility] [phone]

## 2014-12-02 NOTE — Progress Notes (Signed)
Initial Nutrition Assessment  DOCUMENTATION CODES:  Obesity unspecified  INTERVENTION: Encourage adequate PO intake.   Per ASPEN guidelines, TPN is mostly recommended in patients that do no have a functioning GI tract and in those with a condition that require complete bowel rest. TPN is also associated with increased risks of infections, glucose abnormalities (liver dysfunction), and electrolyte abnormalities.   Provide Ensure Enlive po TID, each supplement provides 350 kcal and 20 grams of protein.  Provide 30 ml Prostat po TID, each supplement provides 100 kcal and 15 grams of protein.  If po intake continues to be poor, recommend enteral nutrition (pending family decision) of Glucerna 1.2 via tube at 20 ml/hr and increase by 10 ml every 4 hours to goal rate of 60 ml/hr with 30 ml Prostat once daily to provide 1828 kcal, 101 grams of protein, and 1166 ml of free water.  RD to continue to monitor.   NUTRITION DIAGNOSIS:  Inadequate oral intake related to  (decreased appetite) as evidenced by meal completion < 25%.  GOAL:  Patient will meet greater than or equal to 90% of their needs  MONITOR:  PO intake, Supplement acceptance, Weight trends, Labs, I & O's  REASON FOR ASSESSMENT:  Consult New TPN/TNA  ASSESSMENT: Pt with past medical history of dementia, diabetes, hypertension, neurogenic claudication, s/p laminectomy March 2016, subsequently fell and had left subcapital femoral neck fracture, has had ORIF and then sent to SNF. Pt found to have leukocytosis and increasing creatinine, CHF, acute on chronic CKD stage 4.   RD consulted for TPN, however per Pharmacy after discussion with MD, TPN on hold for now to follow up with family to make a final decision.   Per ASPEN guidelines, TPN is mostly recommended in patients that do no have a functioning GI tract and in those with a condition that require complete bowel rest. TPN is also associated with increased risks of  infections, glucose abnormalities (liver dysfunction), and electrolyte abnormalities.   Pt has been tolerating her food at meals. Pt does not appear to have a dysfunctional GI tract. Intake has been poor due to a reported lack of appetite resulting in most refusal of PO.  Meal completion this AM was 10%. Pt reports PTA she was eating better with consumption of 2-3 meals a day. Weight has been stable. Of note, pt was a bit confused during time of visit with no family at bedside. Pt is agreeable on trying nutritional supplements. Pt does currently have Prostat ordered and has been consuming most of them. RD to additionally order Ensure to aid in caloric and protein needs. Pt was encouraged to eat her food at meals. If po intake continues to be poor, recommend feeding tube placement and initiation of enteral nutrition.   Pt with no observed significant fat or muscle mass loss.   RD to continue to monitor.   Height:  Ht Readings from Last 1 Encounters:  11/27/14  (1.651 m)    Weight:  Wt Readings from Last 1 Encounters:  12/01/14 229 lb (103.874 kg)    Ideal Body Weight:  56.8 kg  Wt Readings from Last 10 Encounters:  12/01/14 229 lb (103.874 kg)  10/16/14 224 lb (101.606 kg)  09/07/14 224 lb 1.6 oz (101.651 kg)  09/08/14 224 lb (101.606 kg)  08/18/14 230 lb (104.327 kg)  10/11/13 232 lb (105.235 kg)  07/09/13 237 lb (107.502 kg)  08/24/12 240 lb (108.863 kg)  02/17/12 247 lb (112.038 kg)  01/28/11 240 lb (  108.863 kg)    BMI:  Body mass index is 38.11 kg/(m^2). Class II obesity  Estimated Nutritional Needs:  Kcal:  1750-1900  Protein:  100-120 grams  Fluid:  Per MD  Skin:  Wound (see comment) (Stage I pressure ulcer on L heel, and buttocks,+2 LE edema)  Diet Order:  DIET DYS 3 Room service appropriate?: Yes; Fluid consistency:: Honey Thick  EDUCATION NEEDS:  Education needs no appropriate at this time   Intake/Output Summary (Last 24 hours) at 12/02/14  1251 Last data filed at 12/02/14 0918  Gross per 24 hour  Intake 1123.33 ml  Output   1475 ml  Net -351.67 ml    Last BM:  6/6  Marijean NiemannStephanie La, MS, RD, LDN Pager # [phone] After hours/ weekend pager # [phone]

## 2014-12-02 NOTE — Progress Notes (Signed)
D/w daughter, PEG tube placement for nutrition and to prevent hypoglycemia

## 2014-12-02 NOTE — Progress Notes (Addendum)
Lab called regarding CBG of 41.  Attempted to verify patient's blood sugar, but patient refused.  Patient very irritated this morning.  Attending physician, Dr. [name], present in the room at the time and was made aware.  Verbal orders received to administer half an amp of D50 and to recheck CBG after administration.  Half an amp given.  Notified on-coming RN of situation.

## 2014-12-02 NOTE — Progress Notes (Signed)
Subjective: Confused. Asking about what to do with [employer].   No SOB  Objective: Vital signs in last 24 hours: Temp:  [98.2 F (36.8 C)-99.2 F (37.3 C)] 98.9 F (37.2 C) (06/07 0917) Pulse Rate:  [85-101] 99 (06/07 0917) Resp:  [18-22] 19 (06/07 0917) BP: (118-134)/(35-62) 125/62 mmHg (06/07 0917) SpO2:  [95 %-98 %] 98 % (06/07 0917) Weight:  [229 lb (103.874 kg)] 229 lb (103.874 kg) (06/06 2056) Last BM Date: 12/01/14  Intake/Output from previous day: 06/06 0701 - 06/07 0700 In: 1293.3 [P.O.:360; I.V.:433.3; IV Piggyback:500] Out: 1375 [Urine:1375] Intake/Output this shift: Total I/O In: 60 [P.O.:60] Out: 400 [Urine:400]  Medications Scheduled Meds: . aspirin  81 mg Oral Daily  . cholecalciferol  1,000 Units Oral Daily  . colesevelam  1,875 mg Oral BID WC  . ezetimibe  10 mg Oral q1800  . feeding supplement (PRO-STAT SUGAR FREE 64)  30 mL Oral BID  . furosemide  80 mg Intravenous BID  . heparin  5,000 Units Subcutaneous 3 times per day  . hydrALAZINE  10 mg Oral 3 times per day  . insulin aspart  0-5 Units Subcutaneous QHS  . isosorbide dinitrate  5 mg Oral TID  . metoprolol succinate  12.5 mg Oral Daily  . pantoprazole  40 mg Oral QHS  . rosuvastatin  5 mg Oral q1800  . senna-docusate  1 tablet Oral BID  . sodium bicarbonate  650 mg Oral TID   Continuous Infusions: . dextrose 20 mL/hr at 12/01/14 1336   PRN Meds:.acetaminophen, albuterol, RESOURCE THICKENUP CLEAR, sodium chloride  PE: General appearance: confused and no distress Neck: no JVD Lungs: Clear anteriorly.  No wheeze. Heart: Heart Sounds soft.  regular rate and rhythm, no murmur, Abdomen: +BS, nontender Extremities: 1-2+ LEE and UEE  Pulses: 2+ and symmetric Skin: Warm and dry Neurologic: Grossly normal except for confusion  TELE: no adverse rhythms.   Lab Results:   Recent Labs  12/01/14 0606 12/02/14 0440  WBC 15.8* 14.9*  HGB 10.1* 9.9*  HCT 30.7* 29.9*  PLT 281 270    BMET  Recent Labs  11/30/14 0437 12/01/14 0606 12/02/14 0440  NA 141 140 139  K 2.8* 3.0* 3.2*  CL 112* 110 108  CO2 17* 19* 19*  GLUCOSE 29* 42* 41*  BUN 28* 27* 29*  CREATININE 2.88* 3.09* 3.19*  CALCIUM 7.6* 7.7* 7.8*     Assessment/Plan     AKI (acute kidney injury)   FTT (failure to thrive) in adult   HTN (hypertension), benign   Leukocytosis   Encephalopathy   DM type 2 (diabetes mellitus, type 2)   Essential hypertension   UTI (lower urinary tract infection)   Pressure ulcer   Acute on chronic combined systolic and diastolic HF (heart failure)   Confusion   Acute systolic heart failure  81 yo female with PMH of HTN, DM, dementia, CKD and PAD who recently have laminectoy in March 2016 with subsequent fall and femoral neck fracture s/p ORIF. She has been living in SNF.   Her acute on chronic renal insufficiency was initially felt to be prerenal secondary to dehydration. While receiving IV fluid in the ED, she had spontaneous flash pulmonary edema. She subsequently received IV Lasix   1. Acute systolic heart failure - echo LVEF 35-40%, indeterminate diastolic function, mild to mod MR - potential ATN according to renal notes - CXR 11/27/14 with mild edema   CXR 6/5: Stable appearance of mild congestive heart  failure, with small bilateral pleural effusions and bibasilar atelectasis. - weights 220 on 6/1, 226 6/2, 6/4 228, today 229.    - Hypoalbuminemia is not helping with edema and diuresis.  Intravascularly, I don't know how volume overloaded she is.  CXR with mild CHF and previous BNP 752.(11/28/14).  Diuresis is complicated by her AKI, we will defer diuretic dosing to renal, currently she is off diuretics.  - started low dose Toprol XL 6/4. Avoid ACE/ARB in setting of AKI. We will try low dose hydral/nitrates for afterload reduction. Seems to tolerate well.   2. PSVT - started Toprol XL yesterday - significant hypokalemia and hypomag today, please keep K at 4  and Mg at 2 - would focus on normalizing electrolytes, if PSVT episodes continue can titrate up beta blocker however would try to avoid over aggressive titration in the setting of volume overload. Currently doing well with low dose.   3.  PAF Appears she was having some afib RVR previously on tele.  She also has periods of PACs. Artifact noted as well.  CHADSVASC 7.  Not an anticoag candidate given underlying confusion.  SR now.   4. AKI - followed by renal, concern for possible ATN combined with CHF. She has been on abx as well. Only mildly to moderately decreased LVEF, low flow CHF is unlikely.  5.  Hypomagnesemia:  Improved to 1.8  6:  Hypokalemia:  Continue to replace    LOS: 6 days    Donato Schultz MD  12/02/2014 10:53 AM  Anne Fu, Rollyn Scialdone

## 2014-12-03 LAB — BASIC METABOLIC PANEL
ANION GAP: 10 (ref 5–15)
BUN: 27 mg/dL — ABNORMAL HIGH (ref 6–20)
CO2: 19 mmol/L — ABNORMAL LOW (ref 22–32)
Calcium: 8.1 mg/dL — ABNORMAL LOW (ref 8.9–10.3)
Chloride: 110 mmol/L (ref 101–111)
Creatinine, Ser: 2.81 mg/dL — ABNORMAL HIGH (ref 0.44–1.00)
GFR calc Af Amer: 17 mL/min — ABNORMAL LOW (ref >60)
GFR calc non Af Amer: 15 mL/min — ABNORMAL LOW (ref >60)
Glucose, Bld: 120 mg/dL — ABNORMAL HIGH (ref 65–99)
POTASSIUM: 4 mmol/L (ref 3.5–5.1)
Sodium: 139 mmol/L (ref 135–145)

## 2014-12-03 LAB — GLUCOSE, CAPILLARY
GLUCOSE-CAPILLARY: 88 mg/dL (ref 65–99)
GLUCOSE-CAPILLARY: 131 mg/dL — AB (ref 65–99)
Glucose-Capillary: 116 mg/dL — ABNORMAL HIGH (ref 65–99)
Glucose-Capillary: 133 mg/dL — ABNORMAL HIGH (ref 65–99)

## 2014-12-03 MED ORDER — SODIUM CHLORIDE 0.9 % IJ SOLN
10.0000 mL | INTRAMUSCULAR | Status: DC | PRN
  Administered 2014-12-03 – 2014-12-06 (×4): 10 mL
  Filled 2014-12-03 (×4): qty 40

## 2014-12-03 MED ORDER — FUROSEMIDE 40 MG PO TABS
40.0000 mg | ORAL_TABLET | Freq: Two times a day (BID) | ORAL | Status: DC
  Administered 2014-12-03 – 2014-12-06 (×5): 40 mg via ORAL
  Filled 2014-12-03 (×11): qty 1

## 2014-12-03 MED ORDER — HEPARIN SODIUM (PORCINE) 5000 UNIT/ML IJ SOLN
5000.0000 [IU] | Freq: Three times a day (TID) | INTRAMUSCULAR | Status: DC
  Administered 2014-12-05 – 2014-12-10 (×11): 5000 [IU] via SUBCUTANEOUS
  Filled 2014-12-03 (×19): qty 1

## 2014-12-03 MED ORDER — IOHEXOL 300 MG/ML  SOLN: 25.0000 mL | INTRAMUSCULAR | Status: AC

## 2014-12-03 NOTE — Progress Notes (Signed)
Patient Profile: 81 yo female with PMH of HTN, DM, dementia, CKD and PAD who recently have laminectoy in March 2016 with subsequent fall and femoral neck fracture s/p ORIF. She has been living in SNF. Her acute on chronic renal insufficiency was initially felt to be prerenal secondary to dehydration. While receiving IV fluid in the ED, she had spontaneous flash pulmonary edema. She subsequently received IV Lasix.   Subjective: Still confused. Denies any pain or dyspnea. Breathing ok w/o supplemental O2.   Objective: Vital signs in last 24 hours: Temp:  [97.7 F (36.5 C)-99.6 F (37.6 C)] 97.9 F (36.6 C) (06/08 1009) Pulse Rate:  [80-104] 85 (06/08 1009) Resp:  [16-18] 16 (06/08 1009) BP: (102-157)/(45-77) 157/54 mmHg (06/08 1009) SpO2:  [97 %-99 %] 98 % (06/08 1009) Weight:  [219 lb 11.2 oz (99.655 kg)] 219 lb 11.2 oz (99.655 kg) (06/07 2031) Last BM Date: 12/02/14  Intake/Output from previous day: 06/07 0701 - 06/08 0700 In: 60 [P.O.:60] Out: 2500 [Urine:2500] Intake/Output this shift: Total I/O In: 240 [P.O.:240] Out: -   Medications Current Facility-Administered Medications  Medication Dose Route Frequency Provider Last Rate Last Dose  . acetaminophen (TYLENOL) tablet 1,000 mg  1,000 mg Oral Q6H PRN Houston Siren, MD      . albuterol (PROVENTIL) (2.5 MG/3ML) 0.083% nebulizer solution 2.5 mg  2.5 mg Nebulization Q2H PRN Houston Siren, MD      . aspirin chewable tablet 81 mg  81 mg Oral Daily Dorothea Ogle, MD   81 mg at 12/03/14 1030  . cholecalciferol (VITAMIN D) tablet 1,000 Units  1,000 Units Oral Daily Houston Siren, MD   1,000 Units at 12/03/14 1024  . colesevelam [facility]) tablet 1,875 mg  1,875 mg Oral BID WC Houston Siren, MD   1,875 mg at 12/03/14 0900  . dextrose 10 % infusion   Intravenous Continuous Pearson Grippe, MD   Stopped at 12/02/14 2000  . ezetimibe (ZETIA) tablet 10 mg  10 mg Oral q1800 Houston Siren, MD   10 mg at 12/02/14 1707  . feeding supplement (ENSURE ENLIVE) (ENSURE  ENLIVE) liquid 237 mL  237 mL Oral TID BM Stephanie La, RD   237 mL at 12/03/14 0900  . feeding supplement (PRO-STAT SUGAR FREE 64) liquid 30 mL  30 mL Oral TID BM Marijean Niemann, RD   30 mL at 12/02/14 1523  . furosemide (LASIX) tablet 40 mg  40 mg Oral BID Pearson Grippe, MD   40 mg at 12/03/14 1024  . heparin injection 5,000 Units  5,000 Units Subcutaneous 3 times per day Houston Siren, MD   5,000 Units at 12/02/14 1523  . hydrALAZINE (APRESOLINE) tablet 10 mg  10 mg Oral 3 times per day Antoine Poche, MD   10 mg at 12/02/14 1524  . insulin aspart (novoLOG) injection 0-5 Units  0-5 Units Subcutaneous QHS Houston Siren, MD   Stopped at 11/30/14 2200  . isosorbide dinitrate (ISORDIL) tablet 5 mg  5 mg Oral TID Antoine Poche, MD   5 mg at 12/03/14 1024  . metoprolol succinate (TOPROL-XL) 24 hr tablet 12.5 mg  12.5 mg Oral Daily Antoine Poche, MD   12.5 mg at 12/03/14 1024  . pantoprazole (PROTONIX) EC tablet 40 mg  40 mg Oral QHS Herby Abraham, RPH   40 mg at 12/01/14 2121  . potassium chloride 10 mEq in 100 mL IVPB  10 mEq Intravenous Once Pearson Grippe, MD      .  RESOURCE THICKENUP CLEAR   Oral PRN Alison MurrayAlma M Devine, MD      . rosuvastatin (CRESTOR) tablet 5 mg  5 mg Oral q1800 Pearson GrippeJames Kim, MD   5 mg at 12/02/14 1707  . senna-docusate (Senokot-S) tablet 1 tablet  1 tablet Oral BID Houston SirenPeter Le, MD   1 tablet at 12/03/14 1024  . sodium bicarbonate tablet 650 mg  650 mg Oral TID Zetta BillsJay Patel, MD   650 mg at 12/03/14 1036  . sodium chloride 0.9 % injection 10-40 mL  10-40 mL Intracatheter PRN Dorothea OgleIskra M Myers, MD   10 mL at 12/02/14 0440  . sodium chloride 0.9 % injection 10-40 mL  10-40 mL Intracatheter PRN Pearson GrippeJames Kim, MD        PE: General appearance: alert, cooperative, no distress and confused Neck: no carotid bruit and no JVD Lungs: CTAB anteriorly. No wheezing Heart: regular rate and rhythm, S1, S2 normal, no murmur, click, rub or gallop Extremities: 1+ bilateral LEE Pulses: 2+ and symmetric Skin: warm and  dry Neurologic: Grossly normal with mild confusion  Lab Results:   Recent Labs  12/01/14 0606 12/02/14 0440  WBC 15.8* 14.9*  HGB 10.1* 9.9*  HCT 30.7* 29.9*  PLT 281 270   BMET  Recent Labs  12/01/14 0606 12/02/14 0440  NA 140 139  K 3.0* 3.2*  CL 110 108  CO2 19* 19*  GLUCOSE 42* 41*  BUN 27* 29*  CREATININE 3.09* 3.19*  CALCIUM 7.7* 7.8*   Filed Weights   11/30/14 2006 12/01/14 2056 12/02/14 2031  Weight: 223 lb 6.4 oz (101.334 kg) 229 lb (103.874 kg) 219 lb 11.2 oz (99.655 kg)    Assessment/Plan  Principal Problem:   AKI (acute kidney injury) Active Problems:   FTT (failure to thrive) in adult   HTN (hypertension), benign   Leukocytosis   Encephalopathy   DM type 2 (diabetes mellitus, type 2)   Essential hypertension   UTI (lower urinary tract infection)   Pressure ulcer   Acute on chronic combined systolic and diastolic HF (heart failure)   Confusion   Acute systolic heart failure   1. Acute Systolic Heart Failure: echo LVEF 35-40%, indeterminate diastolic function, mild to mod MR. CXR 11/27/14 with mild edema.  CXR 6/5: Stable appearance of mild congestive heart failure, with small bilateral pleural effusions and bibasilar atelectasis. Still with bilateral LEE. No dyspnea. No increased O2 requirements. She is currently on 40 mg Lasix BID. She has acute kidney injury with SCr >3.0. Nephrology following. We will continue to defer diuretic dosing/ continuation to renal.   2. PSVT: NSR on telemetry. Only occasional PVC and couplets, but no other arrhythmias.  Continue BB therapy. Keep K and Mg WNL.   3. PAF: NSR on telemetry. No recurrent atrial fibrillation captured on tele. Rate is well controlled on BB. CHADSVASC 7. Not an anticoag candidate given underlying confusion.  4. AKI: slight bump in SCr overnight. Now at 3.19 (3.09 yesterday). Renal following.   5. Hypomagnesemia: repleated. Normal at 2.3 yesterday.   6. Hypokalemia: K is 3.2. Supplement  with K-Dur.       LOS: 7 days    Brittainy M. Delmer IslamSimmons, PA-C 12/03/2014 11:38 AM  Personally seen and examined. Agree with above. Agree with Dr. Briant CedarMattingly about hyperlipidemia therapy. Will stop Zetia and Welchol and continue crestor  5mg .  Donato SchultzSKAINS, MARK, MD

## 2014-12-03 NOTE — Progress Notes (Signed)
     Subjective:  S/P L hip hemiarthroplasty 09/28/14. Patient reports pain as mild.  Resting comfortably in bed but very confused.  Asking to have a gun made since it is fashionable today.    Objective:   VITALS:   Filed Vitals:   12/02/14 0917 12/02/14 1647 12/02/14 2031 12/03/14 0446  BP: 125/62 136/45 149/54 102/77  Pulse: 99 80 90 104  Temp: 98.9 F (37.2 C) 99.6 F (37.6 C) 98.2 F (36.8 C) 97.7 F (36.5 C)  TempSrc: Oral Axillary Oral Oral  Resp: 19 18 18 16   Height:      Weight:   99.655 kg (219 lb 11.2 oz)   SpO2: 98% 98% 99% 97%   Altered mental status due to dementia ABD soft Neurovascular intact Sensation intact distally Intact pulses distally Dorsiflexion/Plantar flexion intact Incision: scant drainage   Lab Results  Component Value Date   WBC 14.9* 12/02/2014   HGB 9.9* 12/02/2014   HCT 29.9* 12/02/2014   MCV 91.2 12/02/2014   PLT 270 12/02/2014   BMET    Component Value Date/Time   NA 139 12/02/2014 0440   K 3.2* 12/02/2014 0440   CL 108 12/02/2014 0440   CO2 19* 12/02/2014 0440   GLUCOSE 41* 12/02/2014 0440   BUN 29* 12/02/2014 0440   CREATININE 3.19* 12/02/2014 0440   CALCIUM 7.8* 12/02/2014 0440   GFRNONAA 13* 12/02/2014 0440   GFRAA 15* 12/02/2014 0440     Assessment/Plan:     Principal Problem:   AKI (acute kidney injury) Active Problems:   FTT (failure to thrive) in adult   HTN (hypertension), benign   Leukocytosis   Encephalopathy   DM type 2 (diabetes mellitus, type 2)   Essential hypertension   UTI (lower urinary tract infection)   Pressure ulcer   Acute on chronic combined systolic and diastolic HF (heart failure)   Confusion   Acute systolic heart failure   Up with therapy WBAT in the LLE, ok to discontinue posterior hip precautions at this time Will continue to monitor incision.  Seems to be stable at this time with scant serrous drainage.  Will have nursing continue to change the dressings PRN.   Sreeja Spies  Hilda LiasMarie 12/03/2014, 7:49 AM Cell [phone]

## 2014-12-03 NOTE — Progress Notes (Signed)
PT Cancellation Note  Patient Details Name: Tina [name] MRN: [id number] DOB: 01/23/1931   Cancelled Treatment:    Reason Eval/Treat Not Completed: Patient declined, no reason specified.   Spoke with nursing and left another sticky note for MD.  Apparently PA from orthopedics was in and noted incision.  No word on her lack of PT participation.   Ivar DrapeStout, Mehtab Dolberry E 12/03/2014, 5:21 PM   Samul Dadauth Tina [name], PT MS Acute Rehab Dept. Number: [facility] [id number] and [facility] [phone]

## 2014-12-03 NOTE — Progress Notes (Signed)
Patient refused AM labs and medications.  Will notify Dr. [name].

## 2014-12-03 NOTE — Progress Notes (Signed)
Peripherally Inserted Central Catheter/Midline Placement  The IV Nurse has discussed with the patient and/or persons authorized to consent for the patient, the purpose of this procedure and the potential benefits and risks involved with this procedure.  The benefits include less needle sticks, lab draws from the catheter and patient may be discharged home with the catheter.  Risks include, but not limited to, infection, bleeding, blood clot (thrombus formation), and puncture of an artery; nerve damage and irregular heat beat.  Alternatives to this procedure were also discussed.  PICC/Midline Placement Documentation  PICC / Midline Single Lumen 10/29/14 PICC Left Basilic 43 cm 0 cm (Active)     PICC / Midline Double Lumen 12/03/14 PICC Right Basilic 44 cm (Active)  Indication for Insertion or Continuance of Line Limited venous access - need for IV therapy >5 days (PICC only) 12/03/2014 11:00 AM  Exposed Catheter (cm) 0 cm 12/03/2014 11:00 AM  Dressing Change Due 12/10/14 12/03/2014 11:00 AM       Stacie GlazeJoyce, Shayn Madole Horton 12/03/2014, 11:14 AM

## 2014-12-03 NOTE — Progress Notes (Signed)
Subjective: Afebrile overnite CHF (ef 35-40%) I/o net --2440, Pt denies cp, palp, sob. , pt refused labs this am ARF: Followed by nephrology, picc line pulled out awaiting placement Hypoglycemia: on D10 Hypokalemia: On potassium, pt refused labs this am Encephalopathy: much improved. Pt is able to converse.   Objective:  Objective: Vital signs in last 24 hours: Temp:  [97.7 F (36.5 C)-99.6 F (37.6 C)] 97.7 F (36.5 C) (06/08 0446) Pulse Rate:  [80-104] 104 (06/08 0446) Resp:  [16-19] 16 (06/08 0446) BP: (102-149)/(45-77) 102/77 mmHg (06/08 0446) SpO2:  [97 %-99 %] 97 % (06/08 0446) Weight:  [99.655 kg (219 lb 11.2 oz)] 99.655 kg (219 lb 11.2 oz) (06/07 2031) Weight change: -4.218 kg (-9 lb 4.8 oz) Last BM Date: 12/02/14  Intake/Output from previous day: 06/07 0701 - 06/08 0700 In: 60 [P.O.:60] Out: 2500 [Urine:2500] Intake/Output this shift:    Heent: anicteric Neck: no jvd Heart: rrr s1, s2,  Lung: ctab ABd: soft, nt, nd, +bs Ext: no c/c/e  Lab Results:  Recent Labs  12/01/14 0606 12/02/14 0440  WBC 15.8* 14.9*  HGB 10.1* 9.9*  HCT 30.7* 29.9*  PLT 281 270   BMET  Recent Labs  12/01/14 0606 12/02/14 0440  NA 140 139  K 3.0* 3.2*  CL 110 108  CO2 19* 19*  GLUCOSE 42* 41*  BUN 27* 29*  CREATININE 3.09* 3.19*  CALCIUM 7.7* 7.8*    Studies/Results: No results found.  Medications: I have reviewed the patient's current medications.  Assessment/Plan: Acute on CRF Secondary to ATN Pt is being followed by nephrology, creatinine is trending up.  Zosyn has been stopped. Metabolic acidosis on bicarbonate Change to lasix 40mg  po bid  Hypokalemia Picc line today  Leukocytosis ? Due to uti vs aspiration pneumonia 2nd day off zosyn today.   PSVT Started on toprol xl 11/30/2014, rate improved.   Severe protein calorie malnutrition TPN refused to be started by pharmacy and so I have consulted [facility] to place PEG  tube  Hypoglycemia Hopefully this will improve with nutrition,   Pafib (CHADSVASC 7) Appreciate cardiology input.   CHF (EF 35-45%) Appreciate cardiology input  Dm2 Hga1c=5.8  Hyperlipidemia Decrease crestor due to renal insufficiency  Aspiration pneumonia Speech therapy Off abx today Will monitor  Anemia Repeat cbc in am  DVT prophylaxis: heparin [place]  Acute encephalopathy resolved, secondary to combination of dementia, aspiration and FTT  LOS: 7 days   Pearson GrippeKIM, Krista Som 12/03/2014, 8:32 AM

## 2014-12-03 NOTE — Progress Notes (Signed)
Called Dr. [name] regarding patient's refusal for labs this AM and her refusal of last night's mediations.  Also made aware that patient still has no PICC line yet.  Will move one time dose of potassium to day shift.  Patient was also combative this morning.  Attempted to hit RN and NT and was also verbally abusive towards nursing staff.  Non-restraint soft mitts ordered.

## 2014-12-03 NOTE — Progress Notes (Signed)
Spoke with Renal M.D. about the PICC line placement to this patient,he said" well she is not a dialysis patient after all,its o.k."

## 2014-12-03 NOTE — Progress Notes (Signed)
IR PA aware of request for percutaneous gastrostomy tube for malnutrition. Will order CT Abdomen to evaluate anatomy, order am labs and hold tube feeds and sq heparin tonight in case procedure is able to be done and is appropriate tomorrow 6/9  Pattricia BossKoreen Jessah Danser PA-C Interventional Radiology  12/03/14  3:53 PM

## 2014-12-03 NOTE — Progress Notes (Signed)
Patient refused all her afternoon and evening medications,refused to eat his dinner.She verbalized her refusal in a nice calm way by saying'' leave me alone,do not bother,i want to go to sleep".Patient is not agitated nor physically resistive,just verbalized it on her own nice way.

## 2014-12-03 NOTE — Progress Notes (Addendum)
[place] for TPN Indication: refusal of po intake ~6 days?  No Known Allergies  Patient Measurements: Height: 5' 5" (165.1 cm) Weight: 219 lb 11.2 oz (99.655 kg) IBW/kg (Calculated) : 57 Adjusted Body Weight: 69kg  Vital Signs: Temp: 97.7 F (36.5 C) (06/08 0446) Temp Source: Oral (06/08 0446) BP: 102/77 mmHg (06/08 0446) Pulse Rate: 104 (06/08 0446) Intake/Output from previous day: 06/07 0701 - 06/08 0700 In: 60 [P.O.:60] Out: 2500 [Urine:2500] Intake/Output from this shift:    Labs:  Recent Labs  12/01/14 0606 12/02/14 0440  WBC 15.8* 14.9*  HGB 10.1* 9.9*  HCT 30.7* 29.9*  PLT 281 270     Recent Labs  12/01/14 0606 12/02/14 0440  NA 140 139  K 3.0* 3.2*  CL 110 108  CO2 19* 19*  GLUCOSE 42* 41*  BUN 27* 29*  CREATININE 3.09* 3.19*  CALCIUM 7.7* 7.8*  MG 1.8 2.3  PROT  --  4.4*  ALBUMIN  --  1.6*  AST  --  22  ALT  --  14  ALKPHOS  --  83  BILITOT  --  0.3  PREALBUMIN  --  27.0  TRIG  --  182*   Estimated Creatinine Clearance: 16.2 mL/min (by C-G formula based on Cr of 3.19).    Recent Labs  12/02/14 1644 12/02/14 2133 12/03/14 0812  GLUCAP 95 104* 88    Medical History: Past Medical History  Diagnosis Date  . Diabetes mellitus   . Hypertension   . Osteopenia   . Peripheral vascular disease, unspecified   . GERD (gastroesophageal reflux disease)   . Arthritis   . Shortness of breath     exersion  . Pneumonia     child  . Anemia     hx  . Fall at home October 09, 2013  . DM (diabetes mellitus) type II uncontrolled, periph vascular disorder 09/17/2014  . Hepatitis     "a"    Insulin Requirements in the past 24 hours:  SSI ordered - not yet used  Assessment: 54 yof admitted from SNF for elevated SCr and leukocytosis. Pharmacy was consulted to initiate TPN for reasons of poor oral intake and hypoglycemia. Noted patient is on dysphagia 3 diet and has refused oral intake for ~6 days.  Patient does not have feeding tube and GI tract intact. Per note, Dr. Doyle Askew originally stated the family (daughter is RN) was thinking TPN would be easiest for patient and did not want to place feeding tube or pursue oral feeds. Spoke with Dr. Maudie Mercury this AM to discuss reconsidering a PEG tube and encouraging po intake due to significant risks associated with TPN. Dietician's note provided similar recommendation of enteral nutrition due to functioning GI tract. Dr. Maudie Mercury put in orders for PEG tube placement and to start enteral nutrition.  GI: Prealbumin wnl 27. Progressive FTT per MD. Poor po intake - reported pt ate 10% of meal. SLP consulted - to continue current diet. Vit d, ppi, senna-docusate Endo: DM. A1c 5.8. Multiple hypoglycemic episodes with D50 given on 6/7. CBGs improved today 88-130. D10 now off - renal recommends D50 prn Lytes: K low 3.2 (k run x1 per MD), others wnl. No new labs Renal: AKI on admit, SCr up to 3.19, CrCl~17, UOP good 1 on lasix. Lasix po, bicarb Cards: HLD, PSVT - rate improved with toprol started, asa, welchol, zetia, hydralazine, imdur, crestor Hepatobil: LFTs/alk phos/tbili wnl, TG 182 Neuro: acute encephalopathy, dementia ID: s/p  6 days zosyn for UTI vs. Asp pna. cdiff neg. UC ngf. Afebrile, wbc decreased to 14.9 AC: PAF - CHADSVASC 7 - not anticoag candidate per cards Best Practices: heparin [place], ppi po TPN Access: PICC 10/29/14 (pt removed 6/7) - to be replaced today TPN start date: n/a  Current Nutrition:  Dysphagia 3 diet Prostat Ensure Enlive  Nutritional Goals:  1725-1900 kCal, 70-85 grams of protein per day  Plan:  -PEG tube to be placed today per Dr. Maudie Mercury -See dietician note for enteral feed recommendations -Consider scheduled KCl replacement while on scheduled lasix -Consider adding Megace to help stimulate patient's appetite -Pharmacy will d/c TPN consult with enteral nutrition starting. Please re-consult if necessary  Elicia Lamp, PharmD Clinical  Pharmacist - Resident Pager [phone] 12/03/2014 9:08 AM

## 2014-12-03 NOTE — Progress Notes (Signed)
S: " I'm not hungry since the last time I was pregnant" O:BP 102/77 mmHg  Pulse 104  Temp(Src) 97.7 F (36.5 C) (Oral)  Resp 16  Ht _0  (1.651 m)  Wt 99.655 kg (219 lb 11.2 oz)  BMI 36.56 kg/m2  SpO2 97%  Intake/Output Summary (Last 24 hours) at 12/03/14 0943 Last data filed at 12/03/14 0600  Gross per 24 hour  Intake      0 ml  Output   2100 ml  Net  -2100 ml   Weight change: -4.218 kg (-9 lb 4.8 oz) Gen: Sleeping but arousable CVS:RRR Resp: Clear ant Abd:+ BS NTND Ext: tr edema NEURO:CNI, O only to person, No asterixis   . aspirin  81 mg Oral Daily  . cholecalciferol  1,000 Units Oral Daily  . colesevelam  1,875 mg Oral BID WC  . ezetimibe  10 mg Oral q1800  . feeding supplement (ENSURE ENLIVE)  237 mL Oral TID BM  . feeding supplement (PRO-STAT SUGAR FREE 64)  30 mL Oral TID BM  . furosemide  40 mg Oral BID  . heparin  5,000 Units Subcutaneous 3 times per day  . hydrALAZINE  10 mg Oral 3 times per day  . insulin aspart  0-5 Units Subcutaneous QHS  . isosorbide dinitrate  5 mg Oral TID  . metoprolol succinate  12.5 mg Oral Daily  . pantoprazole  40 mg Oral QHS  . potassium chloride  10 mEq Intravenous Once  . rosuvastatin  5 mg Oral q1800  . senna-docusate  1 tablet Oral BID  . sodium bicarbonate  650 mg Oral TID   No results found. BMET    Component Value Date/Time   NA 139 12/02/2014 0440   K 3.2* 12/02/2014 0440   CL 108 12/02/2014 0440   CO2 19* 12/02/2014 0440   GLUCOSE 41* 12/02/2014 0440   BUN 29* 12/02/2014 0440   CREATININE 3.19* 12/02/2014 0440   CALCIUM 7.8* 12/02/2014 0440   GFRNONAA 13* 12/02/2014 0440   GFRAA 15* 12/02/2014 0440   CBC    Component Value Date/Time   WBC 14.9* 12/02/2014 0440   RBC 3.28* 12/02/2014 0440   RBC 3.68* 11/26/2014 0913   HGB 9.9* 12/02/2014 0440   HCT 29.9* 12/02/2014 0440   HCT 24.7* 09/29/2014 0542   PLT 270 12/02/2014 0440   MCV 91.2 12/02/2014 0440   MCH 30.2 12/02/2014 0440   MCHC 33.1 12/02/2014  0440   RDW 19.4* 12/02/2014 0440   LYMPHSABS 1.2 12/02/2014 0440   MONOABS 0.7 12/02/2014 0440   EOSABS 4.0* 12/02/2014 0440   BASOSABS 0.0 12/02/2014 0440     Assessment: 1. ARF ? Secondary to vanco, ? interstitial nephritis as had some pyuria and mild eosinophilia.  Now off vanco.  UO good 2. Hypokalemia 3. Met acidosis on bicarb 4. Hypomag, SP replacement   Plan: 1. Labs not done this AM for some reason. 2. OK to have a PICC if needed as I would not consider her a dx candidate 3. Ordered stat BMET for this AM  4. Not clear on the role of zetia, crestor and welchol in this demented elderly lady Tina [name]

## 2014-12-03 NOTE — Progress Notes (Signed)
Upon assessment at beginning of shift, patient removed PICC line.  Catheter was intact.  No overt bleeding.  Vital signs stable.  Notified Dr. [name] regarding PICC line removal; verbal orders received to place new PICC line.  IV team notified of situation.  RN from IV team arrived to assess patient and stated someone would arrive to place new PICC line.  No IV access has been placed at this time.  Again placed orders for PICC placement.  Will continue to monitor.

## 2014-12-03 NOTE — Clinical Social Work Note (Signed)
CSW continuing to follow patient's progress and she is not yet medically stable for discharge. Patient getting a PICC line today. CSW will continue to monitor patient's progress and facilitate discharge back to [facility] when medically stable.  Genelle BalVanessa Argie Applegate, CSW, LCSW Licensed Clinical Social Worker Clinical Social Work Department [facility] [phone]

## 2014-12-04 ENCOUNTER — Inpatient Hospital Stay (HOSPITAL_COMMUNITY): Payer: Medicare Other

## 2014-12-04 DIAGNOSIS — R627 Adult failure to thrive: Secondary | ICD-10-CM

## 2014-12-04 LAB — CBC WITH DIFFERENTIAL/PLATELET
BASOS ABS: 0 10*3/uL (ref 0.0–0.1)
BASOS ABS: 0 10*3/uL (ref 0.0–0.1)
BASOS PCT: 0 % (ref 0–1)
Basophils Relative: 0 % (ref 0–1)
EOS PCT: 23 % — AB (ref 0–5)
Eosinophils Absolute: 3.5 10*3/uL — ABNORMAL HIGH (ref 0.0–0.7)
Eosinophils Absolute: 3.1 10*3/uL — ABNORMAL HIGH (ref 0.0–0.7)
Eosinophils Relative: 28 % — ABNORMAL HIGH (ref 0–5)
HCT: 30.4 % — ABNORMAL LOW (ref 36.0–46.0)
HEMATOCRIT: 31.3 % — AB (ref 36.0–46.0)
HEMOGLOBIN: 9.9 g/dL — AB (ref 12.0–15.0)
Hemoglobin: 10.2 g/dL — ABNORMAL LOW (ref 12.0–15.0)
LYMPHS ABS: 1.5 10*3/uL (ref 0.7–4.0)
LYMPHS PCT: 11 % — AB (ref 12–46)
Lymphocytes Relative: 12 % (ref 12–46)
Lymphs Abs: 1.5 10*3/uL (ref 0.7–4.0)
MCH: 29.5 pg (ref 26.0–34.0)
MCH: 29.7 pg (ref 26.0–34.0)
MCHC: 32.6 g/dL (ref 30.0–36.0)
MCHC: 32.6 g/dL (ref 30.0–36.0)
MCV: 90.5 fL (ref 78.0–100.0)
MCV: 91.3 fL (ref 78.0–100.0)
Monocytes Absolute: 0.6 10*3/uL (ref 0.1–1.0)
Monocytes Absolute: 0.7 10*3/uL (ref 0.1–1.0)
Monocytes Relative: 5 % (ref 3–12)
Monocytes Relative: 5 % (ref 3–12)
NEUTROS PCT: 61 % (ref 43–77)
Neutro Abs: 7 10*3/uL (ref 1.7–7.7)
Neutro Abs: 8.1 10*3/uL — ABNORMAL HIGH (ref 1.7–7.7)
Neutrophils Relative %: 55 % (ref 43–77)
PLATELETS: 288 10*3/uL (ref 150–400)
Platelets: 291 10*3/uL (ref 150–400)
RBC: 3.36 MIL/uL — ABNORMAL LOW (ref 3.87–5.11)
RBC: 3.43 MIL/uL — AB (ref 3.87–5.11)
RDW: 19.4 % — AB (ref 11.5–15.5)
RDW: 19.3 % — ABNORMAL HIGH (ref 11.5–15.5)
WBC: 12.6 10*3/uL — ABNORMAL HIGH (ref 4.0–10.5)
WBC: 13.4 10*3/uL — ABNORMAL HIGH (ref 4.0–10.5)

## 2014-12-04 LAB — COMPREHENSIVE METABOLIC PANEL
ALK PHOS: 100 U/L (ref 38–126)
ALT: 13 U/L — AB (ref 14–54)
ANION GAP: 11 (ref 5–15)
AST: 22 U/L (ref 15–41)
Albumin: 1.7 g/dL — ABNORMAL LOW (ref 3.5–5.0)
BILIRUBIN TOTAL: 0.8 mg/dL (ref 0.3–1.2)
BUN: 26 mg/dL — ABNORMAL HIGH (ref 6–20)
CO2: 18 mmol/L — ABNORMAL LOW (ref 22–32)
CREATININE: 2.51 mg/dL — AB (ref 0.44–1.00)
Calcium: 8.3 mg/dL — ABNORMAL LOW (ref 8.9–10.3)
Chloride: 111 mmol/L (ref 101–111)
GFR calc Af Amer: 20 mL/min — ABNORMAL LOW (ref >60)
GFR, EST NON AFRICAN AMERICAN: 17 mL/min — AB (ref >60)
Glucose, Bld: 138 mg/dL — ABNORMAL HIGH (ref 65–99)
POTASSIUM: 3.9 mmol/L (ref 3.5–5.1)
Sodium: 140 mmol/L (ref 135–145)
TOTAL PROTEIN: 4.4 g/dL — AB (ref 6.5–8.1)

## 2014-12-04 LAB — GLUCOSE, CAPILLARY
GLUCOSE-CAPILLARY: 138 mg/dL — AB (ref 65–99)
Glucose-Capillary: 136 mg/dL — ABNORMAL HIGH (ref 65–99)
Glucose-Capillary: 155 mg/dL — ABNORMAL HIGH (ref 65–99)

## 2014-12-04 LAB — PROTIME-INR
INR: 1.99 — AB (ref 0.00–1.49)
INR: 2.05 — AB (ref 0.00–1.49)
Prothrombin Time: 22.5 seconds — ABNORMAL HIGH (ref 11.6–15.2)
Prothrombin Time: 22.9 seconds — ABNORMAL HIGH (ref 11.6–15.2)

## 2014-12-04 MED ORDER — LIDOCAINE VISCOUS 2 % MT SOLN
OROMUCOSAL | Status: AC
  Filled 2014-12-04: qty 15

## 2014-12-04 MED ORDER — PRO-STAT SUGAR FREE PO LIQD
30.0000 mL | Freq: Every day | ORAL | Status: DC
  Administered 2014-12-04 – 2014-12-05 (×2): 30 mL
  Filled 2014-12-04 (×4): qty 30

## 2014-12-04 MED ORDER — JEVITY 1.2 CAL PO LIQD: 1000.0000 mL | ORAL | Status: DC

## 2014-12-04 MED ORDER — GLUCERNA 1.2 CAL PO LIQD
1000.0000 mL | ORAL | Status: DC
  Administered 2014-12-04 – 2014-12-05 (×2): 1000 mL
  Filled 2014-12-04 (×9): qty 1000

## 2014-12-04 NOTE — Progress Notes (Signed)
Subjective: Afebrile overnite, pt pulled out picc line, poor po intake still CHF (ef 35-40%) I/o net --2440, Pt denies cp, palp, sob. , pt refused labs this am ARF: Followed by nephrology Hypoglycemia: stable Hypokalemia: On potassium, pt refused labs this am Encephalopathy: much improved. Pt is able to converse.    Objective: Vital signs in last 24 hours: Temp:  [97.2 F (36.2 C)-98.2 F (36.8 C)] 98.2 F (36.8 C) (06/08 2100) Pulse Rate:  [85-87] 87 (06/09 0500) Resp:  [16] 16 (06/09 0500) BP: (120-157)/(54-91) 120/91 mmHg (06/09 0500) SpO2:  [94 %-98 %] 94 % (06/08 2100) Weight:  [97.796 kg (215 lb 9.6 oz)] 97.796 kg (215 lb 9.6 oz) (06/08 2100) Weight change: -1.86 kg (-4 lb 1.6 oz) Last BM Date: 12/02/14  Intake/Output from previous day: 06/08 0701 - 06/09 0700 In: 360 [P.O.:360] Out: 1351 [Urine:1350; Stool:1] Intake/Output this shift:    Heent: anicteric Neck: no jvd Heart: irr, irr, s1, s2 Lung: ctab Abd: soft, nt, nd, +bs Ext: no c/c/e Skin: no rash   Lab Results:  Recent Labs  12/02/14 0440 12/04/14 0618  WBC 14.9* 12.6*  HGB 9.9* 9.9*  HCT 29.9* 30.4*  PLT 270 288   BMET  Recent Labs  12/02/14 0440 12/03/14 1205  NA 139 139  K 3.2* 4.0  CL 108 110  CO2 19* 19*  GLUCOSE 41* 120*  BUN 29* 27*  CREATININE 3.19* 2.81*  CALCIUM 7.8* 8.1*    Studies/Results: Ct Abdomen Wo Contrast  12/04/2014   CLINICAL DATA:  81 year old diabetic hypertensive female with history of hepatitis presenting with malnutrition. Evaluate for percutaneous gastrostomy tube. Prior appendectomy. Initial encounter.  EXAM: CT ABDOMEN WITHOUT CONTRAST  TECHNIQUE: Multidetector CT imaging of the abdomen was performed following the standard protocol without IV contrast.  COMPARISON:  09/30/2010 CT.  FINDINGS: Under distended stomach is in a normal position. Slightly prominent vessels anterior to the lower aspect of the gastric body/pyloric region.  No extra luminal  bowel inflammatory process, free fluid or free air.  Bilateral pleural effusions with basilar atelectasis greater on left.  Coronary artery calcification. Calcification mitral valve and aortic valve. Heart size within normal limits.  Calcified mildly ectatic abdominal aorta without aneurysmal dilation. Calcification proximal common iliac arteries.  Streak artifact and lack of intravenous contrast limited evaluation. No obvious focal hepatic, splenic, pancreatic, renal or adrenal abnormality. No calcified gallstones.  Degenerative changes lower thoracic lumbar spine. Prior lumbar laminectomy.  IMPRESSION: Under distended stomach is in a normal position. Slightly prominent vessels anterior to the lower aspect of the gastric body/pyloric region. No extra luminal bowel inflammatory process, free fluid or free air.  Bilateral pleural effusions with basilar atelectasis greater on left.  Coronary artery calcification. Calcification mitral valve and aortic valve.  Calcified mildly ectatic abdominal aorta without aneurysmal dilation. Calcification proximal common iliac arteries.  Degenerative changes lower thoracic lumbar spine. Prior lumbar laminectomy.   Electronically Signed   By: Lacy Duverney M.D.   On: 12/04/2014 08:07    Medications: I have reviewed the patient's current medications.  Assessment/Plan: Acute on CRF Secondary to ATN Pt is being followed by nephrology, creatinine is trending up.  Zosyn has been stopped., wbc stable Metabolic acidosis on bicarbonate Tolerating lasix 40mg  po bid  Hypokalemia Picc line today  Leukocytosis ? Due to uti vs aspiration pneumonia 2nd day off zosyn today.   PSVT Started on toprol xl 11/30/2014, rate improved.   Severe protein calorie malnutrition Called [facility] yesterday, was told  IR does these procedures,  Have consulted IR for PEG placement  Hypoglycemia Hopefully this will improve with nutrition,   Pafib (CHADSVASC 7) Appreciate cardiology input.    CHF (EF 35-45%) Appreciate cardiology input  Dm2 Hga1c=5.8  Hyperlipidemia Decrease crestor due to renal insufficiency  Aspiration pneumonia Speech therapy Off abx today Will monitor  Anemia Repeat cbc in am  DVT prophylaxis: heparin [place]  Acute encephalopathy resolved, secondary to combination of dementia, aspiration and FTT   LOS: 8 days   Pearson Grippe 12/04/2014, 8:35 AM

## 2014-12-04 NOTE — Progress Notes (Signed)
Nasogastric tube retracted or pulled  3.5 inches out ,Will order KUB portable .

## 2014-12-04 NOTE — Care Management Note (Signed)
Case Management Note  Patient Details  Name: Tina [name] MRN: [id number] Date of Birth: 1930-11-21  Subjective/Objective:           CM following for progression and d/c planning.         Action/Plan: Plan is for pt to return to SNF when medically stable .   Expected Discharge Date:  11/29/14               Expected Discharge Plan:  Skilled Nursing Facility  In-House Referral:  Clinical Social Work  Discharge planning Services  CM Consult, [place]  Post Acute Care Choice:  NA Choice offered to:  NA  DME Arranged:  N/A DME Agency:  NA  HH Arranged:  NA HH Agency:     Status of Service:  In process, will continue to follow  Medicare Important Message Given:  Yes Date Medicare IM Given:  12/04/14 Medicare IM give by:  Johny Shock RN MPH, case manager, [phone] Date Additional Medicare IM Given:    Additional Medicare Important Message give by:     If discussed at Long Length of Stay Meetings, dates discussed:    Additional Comments:  Starlyn Skeans, RN 12/04/2014, 10:57 AM

## 2014-12-04 NOTE — Progress Notes (Signed)
S: Pulled PICC line out O:BP 120/91 mmHg  Pulse 87  Temp(Src) 98.2 F (36.8 C) (Oral)  Resp 16  Ht _0  (1.651 m)  Wt 97.796 kg (215 lb 9.6 oz)  BMI 35.88 kg/m2  SpO2 94%  Intake/Output Summary (Last 24 hours) at 12/04/14 0928 Last data filed at 12/04/14 0700  Gross per 24 hour  Intake    360 ml  Output   1351 ml  Net   -991 ml   Weight change: -1.86 kg (-4 lb 1.6 oz) Gen: awake and alert CVS:RRR Resp: Clear ant Abd:+ BS NTND Ext: 1+ edema Skin Faint rash with excoriations arms and upper chest NEURO:CNI, O only to person, No asterixis   . aspirin  81 mg Oral Daily  . cholecalciferol  1,000 Units Oral Daily  . feeding supplement (ENSURE ENLIVE)  237 mL Oral TID BM  . feeding supplement (PRO-STAT SUGAR FREE 64)  30 mL Oral TID BM  . furosemide  40 mg Oral BID  . [START ON 12/05/2014] heparin subcutaneous  5,000 Units Subcutaneous 3 times per day  . hydrALAZINE  10 mg Oral 3 times per day  . insulin aspart  0-5 Units Subcutaneous QHS  . isosorbide dinitrate  5 mg Oral TID  . metoprolol succinate  12.5 mg Oral Daily  . pantoprazole  40 mg Oral QHS  . rosuvastatin  5 mg Oral q1800  . senna-docusate  1 tablet Oral BID  . sodium bicarbonate  650 mg Oral TID   Ct Abdomen Wo Contrast  12/04/2014   CLINICAL DATA:  81 year old diabetic hypertensive female with history of hepatitis presenting with malnutrition. Evaluate for percutaneous gastrostomy tube. Prior appendectomy. Initial encounter.  EXAM: CT ABDOMEN WITHOUT CONTRAST  TECHNIQUE: Multidetector CT imaging of the abdomen was performed following the standard protocol without IV contrast.  COMPARISON:  09/30/2010 CT.  FINDINGS: Under distended stomach is in a normal position. Slightly prominent vessels anterior to the lower aspect of the gastric body/pyloric region.  No extra luminal bowel inflammatory process, free fluid or free air.  Bilateral pleural effusions with basilar atelectasis greater on left.  Coronary artery  calcification. Calcification mitral valve and aortic valve. Heart size within normal limits.  Calcified mildly ectatic abdominal aorta without aneurysmal dilation. Calcification proximal common iliac arteries.  Streak artifact and lack of intravenous contrast limited evaluation. No obvious focal hepatic, splenic, pancreatic, renal or adrenal abnormality. No calcified gallstones.  Degenerative changes lower thoracic lumbar spine. Prior lumbar laminectomy.  IMPRESSION: Under distended stomach is in a normal position. Slightly prominent vessels anterior to the lower aspect of the gastric body/pyloric region. No extra luminal bowel inflammatory process, free fluid or free air.  Bilateral pleural effusions with basilar atelectasis greater on left.  Coronary artery calcification. Calcification mitral valve and aortic valve.  Calcified mildly ectatic abdominal aorta without aneurysmal dilation. Calcification proximal common iliac arteries.  Degenerative changes lower thoracic lumbar spine. Prior lumbar laminectomy.   Electronically Signed   By: Genia Del M.D.   On: 12/04/2014 08:07   BMET    Component Value Date/Time   NA 139 12/03/2014 1205   K 4.0 12/03/2014 1205   CL 110 12/03/2014 1205   CO2 19* 12/03/2014 1205   GLUCOSE 120* 12/03/2014 1205   BUN 27* 12/03/2014 1205   CREATININE 2.81* 12/03/2014 1205   CALCIUM 8.1* 12/03/2014 1205   GFRNONAA 15* 12/03/2014 1205   GFRAA 17* 12/03/2014 1205   CBC    Component Value Date/Time  WBC 12.6* 12/04/2014 0618   RBC 3.36* 12/04/2014 0618   RBC 3.68* 11/26/2014 0913   HGB 9.9* 12/04/2014 0618   HCT 30.4* 12/04/2014 0618   HCT 24.7* 09/29/2014 0542   PLT 288 12/04/2014 0618   MCV 90.5 12/04/2014 0618   MCH 29.5 12/04/2014 0618   MCHC 32.6 12/04/2014 0618   RDW 19.4* 12/04/2014 0618   LYMPHSABS 1.5 12/04/2014 0618   MONOABS 0.6 12/04/2014 0618   EOSABS 3.5* 12/04/2014 0618   BASOSABS 0.0 12/04/2014 0618     Assessment: 1. ARF ? Secondary  to vanco, ? interstitial nephritis as had some pyuria and mild eosinophilia.  Now off vanco.  UO good 2. Hypokalemia, improved yest 3. Met acidosis on bicarb 4. Hypomag, SP replacement   Plan: 1. No labs this Am again. 2. I think palliative care should get involved before PEG tube placed  3. Await labs [facility] T

## 2014-12-04 NOTE — Progress Notes (Signed)
PARENTERAL NUTRITION CONSULT NOTE  Pharmacy Consult for TPN Indication: refusal of po intake; IR will not place feeding tube due to concern of patient pulling out  No Known Allergies  Patient Measurements: Height: 5' 5"  (165.1 cm) Weight: 215 lb 9.6 oz (97.796 kg) IBW/kg (Calculated) : 57 Adjusted Body Weight: 69kg  Vital Signs: Temp: 98.2 F (36.8 C) (06/09 0946) Temp Source: Oral (06/09 0946) BP: 110/88 mmHg (06/09 0946) Pulse Rate: 83 (06/09 0946) Intake/Output from previous day: 06/08 0701 - 06/09 0700 In: 360 [P.O.:360] Out: 1351 [Urine:1350; Stool:1] Intake/Output from this shift:    Labs:  Recent Labs  12/02/14 0440 12/04/14 0618  WBC 14.9* 12.6*  HGB 9.9* 9.9*  HCT 29.9* 30.4*  PLT 270 288  INR  --  1.99*     Recent Labs  12/02/14 0440 12/03/14 1205  NA 139 139  K 3.2* 4.0  CL 108 110  CO2 19* 19*  GLUCOSE 41* 120*  BUN 29* 27*  CREATININE 3.19* 2.81*  CALCIUM 7.8* 8.1*  MG 2.3  --   PROT 4.4*  --   ALBUMIN 1.6*  --   AST 22  --   ALT 14  --   ALKPHOS 83  --   BILITOT 0.3  --   PREALBUMIN 27.0  --   TRIG 182*  --    Estimated Creatinine Clearance: 18.2 mL/min (by C-G formula based on Cr of 2.81).    Recent Labs  12/03/14 1242 12/03/14 1559 12/03/14 2202  GLUCAP 116* 133* 131*    Medical History: Past Medical History  Diagnosis Date  . Diabetes mellitus   . Hypertension   . Osteopenia   . Peripheral vascular disease, unspecified   . GERD (gastroesophageal reflux disease)   . Arthritis   . Shortness of breath     exersion  . Pneumonia     child  . Anemia     hx  . Fall at home October 09, 2013  . DM (diabetes mellitus) type II uncontrolled, periph vascular disorder 09/17/2014  . Hepatitis     "a"    Insulin Requirements in the past 24 hours:  SSI - none used in 24h  Assessment: 62 yof admitted from SNF for elevated SCr and leukocytosis. Pharmacy was consulted to initiate TPN for reasons of poor oral intake and  hypoglycemia. Noted patient is on dysphagia 3 diet and has refused oral intake for ~7 days. GI tract intact. Per note, Dr. Doyle Askew originally stated the family (daughter is RN) was thinking TPN would be easiest for patient and did not want to place feeding tube or pursue oral feeds. Spoke with Dr. Maudie Mercury to discuss reconsidering a PEG tube and encouraging po intake due to significant risks associated with TPN. Concerned TPN putting patient at risk for infection, electrolyte abnormalities, glucose abnormalities, and liver dysfunction - per ASPEN guidelines, this is not an appropriate indication for TPN. Dietician's note provided similar recommendation of enteral nutrition due to functioning GI tract.  Dr. Maudie Mercury called TPN pharmacist on 6/9 to re-consult for TPN due to refusal of po intake and IR refusal to place PEG tube due to concern of patient pulling out (note: IR has not written note stating this yet). However pt pulled out PICC line placed yesterday this AM. No TPN access currently.  Spoke with RD Colletta Maryland who received orders from Dr. Maudie Mercury to start tube feeds as pt had NGT placed this AM.  Pt refused all medications yesterday. Per renal, palliative care consult  reasonable  GI: Prealbumin wnl 27. Progressive FTT per MD. Poor po intake - 25% of one meal yesterday per chart. SLP consulted - to continue current diet recs. Vit d, ppi, senna-docusate  Endo: DM. A1c 5.8. Multiple hypoglycemic episodes with D50 given on 6/7. CBGs improved today 88-130  Lytes:  SCr down 3.19>>2.81, CrCl~18. lytes wnl. No new labs  Renal: AKI on admit, SCr up to 3.19, CrCl~17, UOP good 0.6. Lasix po, bicarb. D10@20   Cards: HLD, PSVT - rate improved with toprol started, asa, hydralazine, imdur, crestor  Hepatobil: LFTs/alk phos/tbili wnl, TG 182  Neuro: acute encephalopathy, dementia - confused and agitated  ID: s/p 6 days zosyn for UTI vs. Asp pna. cdiff neg. UC ngf. Afebrile, wbc decreased to 12.6  AC: PAF - CHADSVASC 7  - not anticoag candidate per cards  Best Practices: heparin [place], ppi po TPN Access: Long-term PICC pulled out and replaced 6/8; pulled out again by patient 6/9 - orders to replace today TPN start date: 6/9>>  Current Nutrition:  Dysphagia 3 diet Prostat - refused all doses yesterday Ensure Enlive - 1 dose yesterday, refused 2  Nutritional Goals:  1725-1900 kCal, 100-120 grams of protein per day  Plan:  -Do not start TPN as pt has NGT and tube feeds are starting. Will f/u tolerance and access -Will leave TPN consult for now but will sign off if not needed -Recommend considering Megace for appetite stimulation -Will f/u goals of care  Elicia Lamp, PharmD Clinical Pharmacist - Resident Pager [phone] 12/04/2014 10:16 AM

## 2014-12-04 NOTE — Progress Notes (Signed)
Patient Profile: 81 yo female with PMH of HTN, DM, dementia, CKD and PAD who recently have laminectoy in March 2016 with subsequent fall and femoral neck fracture s/p ORIF. She has been living in SNF. Her acute on chronic renal insufficiency was initially felt to be prerenal secondary to dehydration. While receiving IV fluid in the ED, she had spontaneous flash pulmonary edema. She subsequently received IV Lasix.     Subjective: Still confused. Denies any pain or dyspnea. Breathing ok w/o supplemental O2. Pulled PICC line out.   Objective: Vital signs in last 24 hours: Temp:  [97.2 F (36.2 C)-98.2 F (36.8 C)] 98.2 F (36.8 C) (06/09 0946) Pulse Rate:  [83-87] 83 (06/09 0946) Resp:  [16] 16 (06/09 0946) BP: (110-149)/(56-91) 110/88 mmHg (06/09 0946) SpO2:  [94 %-98 %] 97 % (06/09 0946) Weight:  [215 lb 9.6 oz (97.796 kg)] 215 lb 9.6 oz (97.796 kg) (06/08 2100) Last BM Date: 12/02/14  Intake/Output from previous day: 06/08 0701 - 06/09 0700 In: 360 [P.O.:360] Out: 1351 [Urine:1350; Stool:1] Intake/Output this shift: Total I/O In: 100 [P.O.:100] Out: 0   Medications Current Facility-Administered Medications  Medication Dose Route Frequency Provider Last Rate Last Dose  . acetaminophen (TYLENOL) tablet 1,000 mg  1,000 mg Oral Q6H PRN Houston Siren, MD      . albuterol (PROVENTIL) (2.5 MG/3ML) 0.083% nebulizer solution 2.5 mg  2.5 mg Nebulization Q2H PRN Houston Siren, MD      . aspirin chewable tablet 81 mg  81 mg Oral Daily Dorothea Ogle, MD   81 mg at 12/03/14 1030  . cholecalciferol (VITAMIN D) tablet 1,000 Units  1,000 Units Oral Daily Houston Siren, MD   1,000 Units at 12/03/14 1024  . feeding supplement (GLUCERNA 1.2 CAL) liquid 1,000 mL  1,000 mL Per Tube Continuous Marijean Niemann, RD      . feeding supplement (PRO-STAT SUGAR FREE 64) liquid 30 mL  30 mL Per Tube Daily Marijean Niemann, RD      . furosemide (LASIX) tablet 40 mg  40 mg Oral BID Pearson Grippe, MD   40 mg at 12/03/14 1024    . [START ON 12/05/2014] heparin injection 5,000 Units  5,000 Units Subcutaneous 3 times per day Pearson Grippe, MD      . hydrALAZINE (APRESOLINE) tablet 10 mg  10 mg Oral 3 times per day Antoine Poche, MD   10 mg at 12/02/14 1524  . insulin aspart (novoLOG) injection 0-5 Units  0-5 Units Subcutaneous QHS Houston Siren, MD   Stopped at 11/30/14 2200  . isosorbide dinitrate (ISORDIL) tablet 5 mg  5 mg Oral TID Antoine Poche, MD   5 mg at 12/03/14 1502  . lidocaine (XYLOCAINE) 2 % viscous mouth solution           . metoprolol succinate (TOPROL-XL) 24 hr tablet 12.5 mg  12.5 mg Oral Daily Antoine Poche, MD   12.5 mg at 12/03/14 1024  . pantoprazole (PROTONIX) EC tablet 40 mg  40 mg Oral QHS Herby Abraham, RPH   40 mg at 12/01/14 2121  . RESOURCE THICKENUP CLEAR   Oral PRN [name], MD      . rosuvastatin (CRESTOR) tablet 5 mg  5 mg Oral q1800 Pearson Grippe, MD   5 mg at 12/02/14 1707  . senna-docusate (Senokot-S) tablet 1 tablet  1 tablet Oral BID Houston Siren, MD   1 tablet at 12/03/14 1024  . sodium bicarbonate tablet 650 mg  650 mg Oral TID Zetta Bills, MD   650 mg at 12/03/14 1502  . sodium chloride 0.9 % injection 10-40 mL  10-40 mL Intracatheter PRN Dorothea Ogle, MD   10 mL at 12/02/14 0440  . sodium chloride 0.9 % injection 10-40 mL  10-40 mL Intracatheter PRN Pearson Grippe, MD   10 mL at 12/03/14 1630    PE: General appearance: alert, cooperative, no distress and confused Neck: no carotid bruit and no JVD Lungs: CTAB anteriorly. No wheezing Heart: regular rate and rhythm, S1, S2 normal, no murmur, click, rub or gallop Extremities: 1+ bilateral LEE Pulses: 2+ and symmetric Skin: warm and dry Neurologic: Grossly normal with mild confusion  Lab Results:   Recent Labs  12/02/14 0440 12/04/14 0618 12/04/14 1202  WBC 14.9* 12.6* 13.4*  HGB 9.9* 9.9* 10.2*  HCT 29.9* 30.4* 31.3*  PLT 270 288 291   BMET  Recent Labs  12/02/14 0440 12/03/14 1205 12/04/14 1202  NA 139 139 140  K  3.2* 4.0 3.9  CL 108 110 111  CO2 19* 19* 18*  GLUCOSE 41* 120* 138*  BUN 29* 27* 26*  CREATININE 3.19* 2.81* 2.51*  CALCIUM 7.8* 8.1* 8.3*   Filed Weights   12/01/14 2056 12/02/14 2031 12/03/14 2100  Weight: 229 lb (103.874 kg) 219 lb 11.2 oz (99.655 kg) 215 lb 9.6 oz (97.796 kg)    Assessment/Plan  Principal Problem:   AKI (acute kidney injury) Active Problems:   FTT (failure to thrive) in adult   HTN (hypertension), benign   Leukocytosis   Encephalopathy   DM type 2 (diabetes mellitus, type 2)   Essential hypertension   UTI (lower urinary tract infection)   Pressure ulcer   Acute on chronic combined systolic and diastolic HF (heart failure)   Confusion   Acute systolic heart failure   1. Acute Systolic Heart Failure: echo LVEF 35-40%, indeterminate diastolic function, mild to mod MR. CXR 11/27/14 with mild edema.  CXR 6/5: Stable appearance of mild congestive heart failure, with small bilateral pleural effusions and bibasilar atelectasis. Still with bilateral LEE. No dyspnea. No increased O2 requirements. She is currently on 40 mg Lasix BID. She has acute kidney injury with SCr >3.0. Nephrology following. We will continue to defer diuretic dosing/ continuation to renal.   2. PSVT: NSR on telemetry. Only occasional PVC and couplets, but no other arrhythmias.  Continue BB therapy. Keep K and Mg WNL.   3. PAF: NSR on telemetry. No recurrent atrial fibrillation captured on tele. Rate is well controlled on BB. CHADSVASC 7. Not an anticoag candidate given underlying confusion.  4. AKI: slight bump in SCr overnight. Now at 3.19 (3.09 yesterday). Renal following.   5. Hypomagnesemia: repleated.   6. Hyperlipidemia - continue only Crestor (PVD).   No further recommendations at this time. Will sign off. Please call if ?  Donato Schultz, MD

## 2014-12-04 NOTE — Progress Notes (Signed)
Nutrition Follow-up  DOCUMENTATION CODES:  Obesity unspecified  INTERVENTION: Initiate Glucerna 1.2 via NGT at 20 ml/hr and increase by 10 ml every 8 hours to goal rate of 60 ml/hr with 30 ml Prostat once daily per tube to provide 1828 kcal (100% of needs), 101 grams of protein, and 1166 ml of free water.  Monitor magnesium, potassium, and phosphorus daily for at least 3 days, MD to replete as needed, as pt is at risk for refeeding syndrome given refusal of most po intake for 8 days.  Discontinue Ensure.  RD to continue to monitor.  NUTRITION DIAGNOSIS:  Inadequate oral intake related to  (decreased appetite) as evidenced by meal completion < 25%; ongoing  GOAL:  Patient will meet greater than or equal to 90% of their needs; not met  MONITOR:  TF tolerance, Weight trends, Labs, I & O's  REASON FOR ASSESSMENT:  Consult Enteral/tube feeding initiation and management  ASSESSMENT: Pt with past medical history of dementia, diabetes, hypertension, neurogenic claudication, s/p laminectomy March 2016, subsequently fell and had left subcapital femoral neck fracture, has had ORIF and then sent to SNF. Pt found to have leukocytosis and increasing creatinine, CHF, acute on chronic CKD stage 4.   PICC has been pulled by pt. NGT tube placed this AM. Per chart, IR will not place PEG do to concerns of pulling it out. Per RN, pt has continued poor po intake with refusal at most times. Nutritional supplements have additionally been refused. Noted pt is at risk for refeeding syndrome due to refusal of most po over 8 days. Plans for tube feedings to be advanced at a slow rate. RD to continue to monitor.   Labs and medications reviewed.  Height:  Ht Readings from Last 1 Encounters:  11/27/14 5' 5"  (1.651 m)    Weight:  Wt Readings from Last 1 Encounters:  12/03/14 215 lb 9.6 oz (97.796 kg)    Ideal Body Weight:  56.8 kg  Wt Readings from Last 10 Encounters:  12/03/14 215 lb 9.6 oz  (97.796 kg)  10/16/14 224 lb (101.606 kg)  09/07/14 224 lb 1.6 oz (101.651 kg)  09/08/14 224 lb (101.606 kg)  08/18/14 230 lb (104.327 kg)  10/11/13 232 lb (105.235 kg)  07/09/13 237 lb (107.502 kg)  07/05/13 237 lb 6.4 oz (107.684 kg)  08/24/12 240 lb (108.863 kg)  02/17/12 247 lb (112.038 kg)    BMI:  Body mass index is 35.88 kg/(m^2). Class II obesity  Estimated Nutritional Needs:  Kcal:  1750-1900  Protein:  100-120 grams  Fluid:  Per MD  Skin:  Wound (see comment) (Stage I pressure ulcer on L heel and buttocks, +2 RLE, +1 LLE edema)  Diet Order:  Diet NPO time specified Except for: Sips with Meds  EDUCATION NEEDS:  Education needs no appropriate at this time   Intake/Output Summary (Last 24 hours) at 12/04/14 1210 Last data filed at 12/04/14 0900  Gross per 24 hour  Intake    220 ml  Output   1351 ml  Net  -1131 ml    Last BM:  6/7  Kallie Locks, MS, RD, LDN Pager # [phone] After hours/ weekend pager # [phone]

## 2014-12-04 NOTE — Progress Notes (Signed)
SLP Cancellation Note  Patient Details Name: Tina [name] MRN: [id number] DOB: 1931-02-10   Cancelled treatment:       Reason Eval/Treat Not Completed: Medical issues which prohibited therapy: pt currently NPO pending possible PEG placement. Would recommend consideration of palliative care consult prior to placement.   Maxcine Ham, M.A. CCC-SLP [phone]  Maxcine Ham 12/04/2014, 12:18 PM

## 2014-12-04 NOTE — Progress Notes (Signed)
Leave a voicemail massage to Marylene Land' s voicemail box.

## 2014-12-04 NOTE — Progress Notes (Signed)
IV nurse notified this RN that patient had pulled out her PICC line. Upon assessment, sight was clean and dry. Patient was confused and agitated and refused vital signs and care. MD notified of PICC line removal and of patient's confusion. Will continue to monitor.   Feliciana Rossetti, RN, BSN

## 2014-12-04 NOTE — Progress Notes (Signed)
Assessed patient's skin before the bilateral soft wrist restraint placed on the patient.Patient arm skin bilaterally from shoulder to hands have a lot of redness,bruised and swollen.Will assess every two hours.

## 2014-12-04 NOTE — Progress Notes (Signed)
PT Cancellation Note  Patient Details Name: AHSOKA VEVERKA MRN: [id number] DOB: 06-Jul-1930   Cancelled Treatment:    Reason Eval/Treat Not Completed: Medical issues which prohibited therapy.  Sedated and restrained to use PEG after she extracted her PICC line last night.   Ivar Drape 12/04/2014, 10:57 AM   Samul Dada, PT MS Acute Rehab Dept. Number: [facility] [id number] and [facility] [phone]

## 2014-12-04 NOTE — Progress Notes (Signed)
Bilateral soft wrist restraint initiated with order from M.D.Daughter made aware,her response was 'how her mother handled the tube?''.Nurse told her that i could not answered her at this time becouse she was sleeping but as per report from the radiolgy department she was combative about it".Dughter said ''she is going to see her mother later on".

## 2014-12-05 LAB — COMPREHENSIVE METABOLIC PANEL
ALK PHOS: 101 U/L (ref 38–126)
ALT: 15 U/L (ref 14–54)
ANION GAP: 12 (ref 5–15)
AST: 24 U/L (ref 15–41)
Albumin: 1.8 g/dL — ABNORMAL LOW (ref 3.5–5.0)
BUN: 26 mg/dL — ABNORMAL HIGH (ref 6–20)
CO2: 20 mmol/L — ABNORMAL LOW (ref 22–32)
Calcium: 8.3 mg/dL — ABNORMAL LOW (ref 8.9–10.3)
Chloride: 109 mmol/L (ref 101–111)
Creatinine, Ser: 2.36 mg/dL — ABNORMAL HIGH (ref 0.44–1.00)
GFR, EST AFRICAN AMERICAN: 21 mL/min — AB (ref >60)
GFR, EST NON AFRICAN AMERICAN: 18 mL/min — AB (ref >60)
GLUCOSE: 164 mg/dL — AB (ref 65–99)
Potassium: 3.4 mmol/L — ABNORMAL LOW (ref 3.5–5.1)
SODIUM: 141 mmol/L (ref 135–145)
Total Bilirubin: 0.5 mg/dL (ref 0.3–1.2)
Total Protein: 4.3 g/dL — ABNORMAL LOW (ref 6.5–8.1)

## 2014-12-05 LAB — GLUCOSE, CAPILLARY
GLUCOSE-CAPILLARY: 133 mg/dL — AB (ref 65–99)
GLUCOSE-CAPILLARY: 141 mg/dL — AB (ref 65–99)
GLUCOSE-CAPILLARY: 160 mg/dL — AB (ref 65–99)
GLUCOSE-CAPILLARY: 161 mg/dL — AB (ref 65–99)
GLUCOSE-CAPILLARY: 172 mg/dL — AB (ref 65–99)
GLUCOSE-CAPILLARY: 175 mg/dL — AB (ref 65–99)
Glucose-Capillary: 180 mg/dL — ABNORMAL HIGH (ref 65–99)

## 2014-12-05 LAB — PHOSPHORUS: Phosphorus: 3.9 mg/dL (ref 2.5–4.6)

## 2014-12-05 LAB — MAGNESIUM: Magnesium: 1.9 mg/dL (ref 1.7–2.4)

## 2014-12-05 MED ORDER — POTASSIUM CHLORIDE CRYS ER 20 MEQ PO TBCR
20.0000 meq | EXTENDED_RELEASE_TABLET | Freq: Two times a day (BID) | ORAL | Status: AC
  Administered 2014-12-05: 20 meq via ORAL
  Filled 2014-12-05: qty 1

## 2014-12-05 MED ORDER — FUROSEMIDE 10 MG/ML IJ SOLN
20.0000 mg | Freq: Once | INTRAMUSCULAR | Status: AC
  Administered 2014-12-05: 20 mg via INTRAVENOUS
  Filled 2014-12-05: qty 2

## 2014-12-05 NOTE — Progress Notes (Signed)
Patient has been very calm and cooperative. No attempts to pull at PICC line or NGT when restraints removed intermittently when RN in room. Bilateral soft wrist restraints discontinued at this time. Patient educated on not pulling at tubes/lines. Will continue to monitor.  Leanna Battles, RN.

## 2014-12-05 NOTE — Progress Notes (Signed)
PARENTERAL NUTRITION CONSULT NOTE  Pharmacy Consult for TPN Indication: refusal of po intake and no enteral access  No Known Allergies  Patient Measurements: Height: 5' 5"  (165.1 cm) Weight: 216 lb 3.2 oz (98.068 kg) IBW/kg (Calculated) : 57 Adjusted Body Weight: 69kg  Vital Signs: Temp: 97.7 F (36.5 C) (06/10 0434) Temp Source: Oral (06/10 0434) BP: 133/44 mmHg (06/10 0434) Pulse Rate: 90 (06/10 0434) Intake/Output from previous day: 06/09 0701 - 06/10 0700 In: 540 [P.O.:460; NG/GT:80] Out: 800 [Urine:800] Intake/Output from this shift:    Labs:  Recent Labs  12/04/14 0618 12/04/14 1202  WBC 12.6* 13.4*  HGB 9.9* 10.2*  HCT 30.4* 31.3*  PLT 288 291  INR 1.99* 2.05*     Recent Labs  12/03/14 1205 12/04/14 1202 12/05/14 0610  NA 139 140 141  K 4.0 3.9 3.4*  CL 110 111 109  CO2 19* 18* 20*  GLUCOSE 120* 138* 164*  BUN 27* 26* 26*  CREATININE 2.81* 2.51* 2.36*  CALCIUM 8.1* 8.3* 8.3*  MG  --   --  1.9  PHOS  --   --  3.9  PROT  --  4.4* 4.3*  ALBUMIN  --  1.7* 1.8*  AST  --  22 24  ALT  --  13* 15  ALKPHOS  --  100 101  BILITOT  --  0.8 0.5   Estimated Creatinine Clearance: 21.7 mL/min (by C-G formula based on Cr of 2.36).    Recent Labs  12/04/14 2012 12/05/14 0011 12/05/14 0431  GLUCAP 155* 141* 133*    Medical History: Past Medical History  Diagnosis Date  . Diabetes mellitus   . Hypertension   . Osteopenia   . Peripheral vascular disease, unspecified   . GERD (gastroesophageal reflux disease)   . Arthritis   . Shortness of breath     exersion  . Pneumonia     child  . Anemia     hx  . Fall at home October 09, 2013  . DM (diabetes mellitus) type II uncontrolled, periph vascular disorder 09/17/2014  . Hepatitis     "a"    Insulin Requirements in the past 24 hours:  SSI - none used in 24h  Assessment: 63 yof admitted from SNF for elevated SCr and leukocytosis. Pharmacy was consulted to initiate TPN for reasons of poor oral  intake and hypoglycemia. Noted patient is on dysphagia 3 diet and has refused oral intake for ~7 days. GI tract intact. Per ASPEN guidelines and RD recommendations, TPN was appropriately deferred.  Dr. Maudie Mercury called TPN pharmacist on 6/9 to re-consult for TPN due to no PEG tube placement. However pt pulled out PICC line. No TPN access. Spoke with RD Colletta Maryland who received orders from Dr. Maudie Mercury to start tube feeds as pt had NGT placed on 6/9.  GI: Prealbumin wnl 27. Progressive FTT per MD. Poor po intake overall but did eat 100% of one meal yesterday per chart. Started enteral feeds on 6/9 - Tolerating tube feeds with minimal residuals. SLP consulted - to continue current diet recs. Vit d, ppi, senna-docusate  Endo: DM. A1c 5.8. Multiple hypoglycemic episodes with D50 given on 6/7. CBGs 133-164  Lytes:  K 3.4 (to replace PICC per MD note), other lytes wnl  Renal: AKI on admit, SCr down to 2.51>>2.36, CrCl~21, UOP low 0.3. Lasix po, bicarb  Cards: HLD, PSVT - rate improved with toprol started, asa, hydralazine, imdur, crestor  Hepatobil: LFTs/alk phos/tbili wnl, TG 182  Neuro: acute encephalopathy,  dementia - confused and agitated  ID: s/p 6 days zosyn for UTI vs. Asp pna. cdiff neg. UC ngf. Afebrile, wbc decreased to 13.4  AC: PAF - CHADSVASC 7 - not anticoag candidate per cards. INR 2.05  Best Practices: heparin [place], ppi po TPN Access: Long-term PICC pulled and replaced 6/8; pulled out again by patient 6/9 - to be replaced 6/10 NGT placed for enteral feeds 6/9 - partially pulled out, back in place  TPN start date: n/a  Current Nutrition:  NPO Prostat - received dose yesterday Glucerna 1.2 - restarted after NGT partially removed  Nutritional Goals:  1725-1900 kCal, 100-120 grams of protein per day  Plan:  -Discussed case with Dr. Maudie Mercury and agreed to sign off consult for now. Pharmacy will not continue to follow -Recommend considering Megace for appetite stimulation  Tina [name],  PharmD Clinical Pharmacist - Resident Pager [phone] 12/05/2014 8:20 AM

## 2014-12-05 NOTE — Progress Notes (Signed)
SLP Cancellation Note  Patient Details Name: Tina [name] MRN: [id number] DOB: 04-28-1931   Cancelled treatment:       Reason Eval/Treat Not Completed: Patient declined, no reason specified   Naylee Frankowski,PAT, M.S., CCC-SLP 12/05/2014, 1:35 PM

## 2014-12-05 NOTE — Progress Notes (Signed)
Nutrition Follow-up  DOCUMENTATION CODES:  Obesity unspecified  INTERVENTION:  Continue advancing Glucerna 1.2 via NGT by 10 ml every 8 hours to goal rate of 60 ml/hr with 30 ml Prostat once daily per tube to provide 1828 kcal (100% of needs), 101 grams of protein, and 1166 ml of free water.  Monitor magnesium, potassium, and phosphorus daily for at least another 2 days, MD to replete as needed, as pt is at risk for refeeding syndrome given refusal of most po intake for 8 days prior to TF.  RD to continue to monitor  NUTRITION DIAGNOSIS:  Inadequate oral intake related to  (decreased appetite) as evidenced by meal completion < 25%; NGT in place- TF started;ongoing  GOAL:  Patient will meet greater than or equal to 90% of their needs; progressing  MONITOR:  TF tolerance, Weight trends, Labs, I & O's  REASON FOR ASSESSMENT:  Consult Enteral/tube feeding initiation and management  ASSESSMENT: Pt with past medical history of dementia, diabetes, hypertension, neurogenic claudication, s/p laminectomy March 2016, subsequently fell and had left subcapital femoral neck fracture, has had ORIF and then sent to SNF. Pt found to have leukocytosis and increasing creatinine, CHF, acute on chronic CKD stage 4.   Spoke with RN, pt has been tolerating her tube feedings with very low residuals. Noted NGT was partially pulled last night, however now is in place. Pt on restraints. During time of visit this AM, TF rate was at 30 ml/hr. RN reports she will advance as scheduled. RD to continue to monitor.   Labs: Low potassium, CO2, calcium, and GFR. High BUN and creatinine. Phosphorous and Magnesium WNL.  Height:  Ht Readings from Last 1 Encounters:  11/27/14 5\' 5"  (1.651 m)    Weight:  Wt Readings from Last 1 Encounters:  12/04/14 216 lb 3.2 oz (98.068 kg)    Ideal Body Weight:  56.8 kg  Wt Readings from Last 10 Encounters:  12/04/14 216 lb 3.2 oz (98.068 kg)  10/16/14 224 lb  (101.606 kg)  09/07/14 224 lb 1.6 oz (101.651 kg)  09/08/14 224 lb (101.606 kg)  08/18/14 230 lb (104.327 kg)  10/11/13 232 lb (105.235 kg)  07/09/13 237 lb (107.502 kg)  07/05/13 237 lb 6.4 oz (107.684 kg)  08/24/12 240 lb (108.863 kg)  02/17/12 247 lb (112.038 kg)    BMI:  Body mass index is 35.98 kg/(m^2).  Estimated Nutritional Needs:  Kcal:  1750-1900  Protein:  100-120 grams  Fluid:  Per MD  Skin:  Wound (see comment) (Stage I pressure ulcer on L heel and buttocks, +2 RLE, +1 LLE edema)  Diet Order:  Diet NPO time specified Except for: Sips with Meds  EDUCATION NEEDS:  Education needs no appropriate at this time   Intake/Output Summary (Last 24 hours) at 12/05/14 1416 Last data filed at 12/05/14 1400  Gross per 24 hour  Intake    425 ml  Output   1100 ml  Net   -675 ml    Last BM:  6/7  Roslyn Smiling, MS, RD, LDN Pager # [phone] After hours/ weekend pager # [phone]

## 2014-12-05 NOTE — Progress Notes (Signed)
Subjective: Afebrile overnite, pt pulled out picc line which was replaced.  FEN:  Consulted IR who thought that TPN was a better choice.  Pt had NGT so pharmacy thought that feeding through that would be a better choice,  poor po intake still , pt amenable to PEG.  Severe protein calorie malnutrition:  CHF (ef 35-40%) I/o net --2440, Pt denies cp, palp, sob. , pt refused labs this am  ARF: Followed by nephrology  Hypoglycemia: stable  Hypokalemia resolved Encephalopathy:  Pt is able to converse. Intermittently confused at times.  Pt has been refractory and disruptive in her treatment.     Objective: Vital signs in last 24 hours: Temp:  [97.7 F (36.5 C)-98.2 F (36.8 C)] 97.7 F (36.5 C) (06/10 0434) Pulse Rate:  [83-90] 90 (06/10 0434) Resp:  [16-17] 17 (06/10 0434) BP: (110-133)/(38-88) 133/44 mmHg (06/10 0434) SpO2:  [94 %-98 %] 94 % (06/10 0434) Weight:  [98.068 kg (216 lb 3.2 oz)] 98.068 kg (216 lb 3.2 oz) (06/09 2015) Weight change: 0.272 kg (9.6 oz) Last BM Date: 12/02/14  Intake/Output from previous day: 06/09 0701 - 06/10 0700 In: 540 [P.O.:460; NG/GT:80] Out: 800 [Urine:800] Intake/Output this shift: Total I/O In: 80 [NG/GT:80] Out: 300 [Urine:300]  Heent: anicteric Neck: no jvd Heart: irr, irr, s1, s2 Lung: ctab Abd: soft, nt, nd, +bs Ext: no c/c/e   Lab Results:  Recent Labs  12/04/14 0618 12/04/14 1202  WBC 12.6* 13.4*  HGB 9.9* 10.2*  HCT 30.4* 31.3*  PLT 288 291   BMET  Recent Labs  12/03/14 1205 12/04/14 1202  NA 139 140  K 4.0 3.9  CL 110 111  CO2 19* 18*  GLUCOSE 120* 138*  BUN 27* 26*  CREATININE 2.81* 2.51*  CALCIUM 8.1* 8.3*    Studies/Results: Ct Abdomen Wo Contrast  12/04/2014   CLINICAL DATA:  81 year old diabetic hypertensive female with history of hepatitis presenting with malnutrition. Evaluate for percutaneous gastrostomy tube. Prior appendectomy. Initial encounter.  EXAM: CT ABDOMEN WITHOUT CONTRAST  TECHNIQUE:  Multidetector CT imaging of the abdomen was performed following the standard protocol without IV contrast.  COMPARISON:  09/30/2010 CT.  FINDINGS: Under distended stomach is in a normal position. Slightly prominent vessels anterior to the lower aspect of the gastric body/pyloric region.  No extra luminal bowel inflammatory process, free fluid or free air.  Bilateral pleural effusions with basilar atelectasis greater on left.  Coronary artery calcification. Calcification mitral valve and aortic valve. Heart size within normal limits.  Calcified mildly ectatic abdominal aorta without aneurysmal dilation. Calcification proximal common iliac arteries.  Streak artifact and lack of intravenous contrast limited evaluation. No obvious focal hepatic, splenic, pancreatic, renal or adrenal abnormality. No calcified gallstones.  Degenerative changes lower thoracic lumbar spine. Prior lumbar laminectomy.  IMPRESSION: Under distended stomach is in a normal position. Slightly prominent vessels anterior to the lower aspect of the gastric body/pyloric region. No extra luminal bowel inflammatory process, free fluid or free air.  Bilateral pleural effusions with basilar atelectasis greater on left.  Coronary artery calcification. Calcification mitral valve and aortic valve.  Calcified mildly ectatic abdominal aorta without aneurysmal dilation. Calcification proximal common iliac arteries.  Degenerative changes lower thoracic lumbar spine. Prior lumbar laminectomy.   Electronically Signed   By: Lacy Duverney M.D.   On: 12/04/2014 08:07   Dg Abd 1 View  12/04/2014   CLINICAL DATA:  Abdominal pain.  Nasogastric tube placement  EXAM: ABDOMEN - 1 VIEW  COMPARISON:  12/04/2014  FINDINGS: Nasogastric tube is seen with tip overlying the gastric antrum or pylorus. No evidence of dilated bowel loops.  IMPRESSION: Nasogastric tube tip overlying the gastric antrum or pylorus.   Electronically Signed   By: Myles Rosenthal M.D.   On: 12/04/2014 21:42    Dg Abd 1 View  12/04/2014   CLINICAL DATA:  NG tube placement.  EXAM: ABDOMEN - 1 VIEW  COMPARISON:  Earlier film, same date.  FINDINGS: The NG tube tip is in the pyloric region or duodenum bulb. The bowel gas pattern is unremarkable.  IMPRESSION: NG tube tip is in the region of the pylorus or duodenum bulb.   Electronically Signed   By: Rudie Meyer M.D.   On: 12/04/2014 16:25   Dg Abd 1 View  12/04/2014   CLINICAL DATA:  Nasogastric tube placement by ede.  EXAM: NASO G TUBE PLACEMENT WITH FL AND WITH RAD; ABDOMEN - 1 VIEW  FLUOROSCOPY TIME:  Fluoroscopy Time (in minutes and seconds): 4 seconds  Number of Acquired Images:  1  COMPARISON:  None  FINDINGS: A single fluoro spot images reveals an esophagogastric tube whose tip lies in the region of the distal stomach and proximal port well below the expected location of the GE junction.  IMPRESSION: Nasogastric tube which appears be appropriately positioned within the stomach.   Electronically Signed   By: David  [place] M.D.   On: 12/04/2014 10:20   Dg Basil Dess Tube Plc W/fl W/rad  12/04/2014   CLINICAL DATA:  Nasogastric tube placement by ede.  EXAM: NASO G TUBE PLACEMENT WITH FL AND WITH RAD; ABDOMEN - 1 VIEW  FLUOROSCOPY TIME:  Fluoroscopy Time (in minutes and seconds): 4 seconds  Number of Acquired Images:  1  COMPARISON:  None  FINDINGS: A single fluoro spot images reveals an esophagogastric tube whose tip lies in the region of the distal stomach and proximal port well below the expected location of the GE junction.  IMPRESSION: Nasogastric tube which appears be appropriately positioned within the stomach.   Electronically Signed   By: David  [place] M.D.   On: 12/04/2014 10:20    Medications: I have reviewed the patient's current medications.  Assessment/Plan: Acute on CRF Secondary to ATN Pt is being followed by nephrology, creatinine is trending down Metabolic acidosis on bicarbonate Tolerating lasix 40mg  po bid  Hypokalemia Picc line  today  Leukocytosis ? Due to uti vs aspiration pneumonia 4 day off zosyn today.   PSVT Started on toprol xl 11/30/2014, rate improved.   Severe protein calorie malnutrition IR thought TPN would be a better choice. Pharmacy decided that they wanted to hold off on TPN Cont NGT and tube feedings  Hypoglycemia Hopefully this will improve with nutrition,   Pafib (CHADSVASC 7) Appreciate cardiology input.   CHF (EF 35-45%) Appreciate cardiology input  Dm2 Hga1c=5.8  Hyperlipidemia On lower dose than normal of crestor due to renal insufficiency  Aspiration pneumonia Speech therapy Off abx today Will monitor  Anemia Repeat cbc in am  DVT prophylaxis: heparin [place]  Acute encephalopathy resolved, secondary to combination of dementia, aspiration and FTT  LOS: 9 days   Pearson Grippe 12/05/2014, 6:03 AM

## 2014-12-05 NOTE — Progress Notes (Signed)
Pt educated on reinsertion of NG tube,pt verbalized that she would let me reinsert tube.Once I attempted to reinsert NG tube pt stated"I don"t want that tube in my nose it"s to painful,and makes me throw up."The patient refused and would not follow simple commands.DR on call will be notified of refusal of NG tube.Will continue to monitor.

## 2014-12-05 NOTE — Progress Notes (Signed)
S: Resting O:BP 128/51 mmHg  Pulse 86  Temp(Src) 97.7 F (36.5 C) (Oral)  Resp 17  Ht 5' 5"  (1.651 m)  Wt 98.068 kg (216 lb 3.2 oz)  BMI 35.98 kg/m2  SpO2 95%  Intake/Output Summary (Last 24 hours) at 12/05/14 1015 Last data filed at 12/05/14 0930  Gross per 24 hour  Intake    530 ml  Output   1100 ml  Net   -570 ml   Weight change: 0.272 kg (9.6 oz) Gen: sleeping CVS:RRR Resp: Clear ant Abd:+ BS NTND Ext: 1+ edema Skin Faint rash with excoriations arms and upper chest NEURO: No asterixis Lt PICC line Feeding tube in   . aspirin  81 mg Oral Daily  . cholecalciferol  1,000 Units Oral Daily  . feeding supplement (PRO-STAT SUGAR FREE 64)  30 mL Per Tube Daily  . furosemide  40 mg Oral BID  . heparin subcutaneous  5,000 Units Subcutaneous 3 times per day  . hydrALAZINE  10 mg Oral 3 times per day  . insulin aspart  0-5 Units Subcutaneous QHS  . isosorbide dinitrate  5 mg Oral TID  . metoprolol succinate  12.5 mg Oral Daily  . pantoprazole  40 mg Oral QHS  . rosuvastatin  5 mg Oral q1800  . senna-docusate  1 tablet Oral BID  . sodium bicarbonate  650 mg Oral TID   Ct Abdomen Wo Contrast  12/04/2014   CLINICAL DATA:  81 year old diabetic hypertensive female with history of hepatitis presenting with malnutrition. Evaluate for percutaneous gastrostomy tube. Prior appendectomy. Initial encounter.  EXAM: CT ABDOMEN WITHOUT CONTRAST  TECHNIQUE: Multidetector CT imaging of the abdomen was performed following the standard protocol without IV contrast.  COMPARISON:  09/30/2010 CT.  FINDINGS: Under distended stomach is in a normal position. Slightly prominent vessels anterior to the lower aspect of the gastric body/pyloric region.  No extra luminal bowel inflammatory process, free fluid or free air.  Bilateral pleural effusions with basilar atelectasis greater on left.  Coronary artery calcification. Calcification mitral valve and aortic valve. Heart size within normal limits.  Calcified  mildly ectatic abdominal aorta without aneurysmal dilation. Calcification proximal common iliac arteries.  Streak artifact and lack of intravenous contrast limited evaluation. No obvious focal hepatic, splenic, pancreatic, renal or adrenal abnormality. No calcified gallstones.  Degenerative changes lower thoracic lumbar spine. Prior lumbar laminectomy.  IMPRESSION: Under distended stomach is in a normal position. Slightly prominent vessels anterior to the lower aspect of the gastric body/pyloric region. No extra luminal bowel inflammatory process, free fluid or free air.  Bilateral pleural effusions with basilar atelectasis greater on left.  Coronary artery calcification. Calcification mitral valve and aortic valve.  Calcified mildly ectatic abdominal aorta without aneurysmal dilation. Calcification proximal common iliac arteries.  Degenerative changes lower thoracic lumbar spine. Prior lumbar laminectomy.   Electronically Signed   By: Genia Del M.D.   On: 12/04/2014 08:07   Dg Abd 1 View  12/04/2014   CLINICAL DATA:  Abdominal pain.  Nasogastric tube placement  EXAM: ABDOMEN - 1 VIEW  COMPARISON:  12/04/2014  FINDINGS: Nasogastric tube is seen with tip overlying the gastric antrum or pylorus. No evidence of dilated bowel loops.  IMPRESSION: Nasogastric tube tip overlying the gastric antrum or pylorus.   Electronically Signed   By: Earle Gell M.D.   On: 12/04/2014 21:42   Dg Abd 1 View  12/04/2014   CLINICAL DATA:  NG tube placement.  EXAM: ABDOMEN - 1 VIEW  COMPARISON:  Earlier film, same date.  FINDINGS: The NG tube tip is in the pyloric region or duodenum bulb. The bowel gas pattern is unremarkable.  IMPRESSION: NG tube tip is in the region of the pylorus or duodenum bulb.   Electronically Signed   By: Marijo Sanes M.D.   On: 12/04/2014 16:25   Dg Abd 1 View  12/04/2014   CLINICAL DATA:  Nasogastric tube placement by ede.  EXAM: NASO G TUBE PLACEMENT WITH FL AND WITH RAD; ABDOMEN - 1 VIEW  FLUOROSCOPY  TIME:  Fluoroscopy Time (in minutes and seconds): 4 seconds  Number of Acquired Images:  1  COMPARISON:  None  FINDINGS: A single fluoro spot images reveals an esophagogastric tube whose tip lies in the region of the distal stomach and proximal port well below the expected location of the GE junction.  IMPRESSION: Nasogastric tube which appears be appropriately positioned within the stomach.   Electronically Signed   By: David  Martinique M.D.   On: 12/04/2014 10:20   Dg Loyce Dys Tube Plc W/fl W/rad  12/04/2014   CLINICAL DATA:  Nasogastric tube placement by ede.  EXAM: NASO G TUBE PLACEMENT WITH FL AND WITH RAD; ABDOMEN - 1 VIEW  FLUOROSCOPY TIME:  Fluoroscopy Time (in minutes and seconds): 4 seconds  Number of Acquired Images:  1  COMPARISON:  None  FINDINGS: A single fluoro spot images reveals an esophagogastric tube whose tip lies in the region of the distal stomach and proximal port well below the expected location of the GE junction.  IMPRESSION: Nasogastric tube which appears be appropriately positioned within the stomach.   Electronically Signed   By: David  Martinique M.D.   On: 12/04/2014 10:20   BMET    Component Value Date/Time   NA 141 12/05/2014 0610   K 3.4* 12/05/2014 0610   CL 109 12/05/2014 0610   CO2 20* 12/05/2014 0610   GLUCOSE 164* 12/05/2014 0610   BUN 26* 12/05/2014 0610   CREATININE 2.36* 12/05/2014 0610   CALCIUM 8.3* 12/05/2014 0610   GFRNONAA 18* 12/05/2014 0610   GFRAA 21* 12/05/2014 0610   CBC    Component Value Date/Time   WBC 13.4* 12/04/2014 1202   RBC 3.43* 12/04/2014 1202   RBC 3.68* 11/26/2014 0913   HGB 10.2* 12/04/2014 1202   HCT 31.3* 12/04/2014 1202   HCT 24.7* 09/29/2014 0542   PLT 291 12/04/2014 1202   MCV 91.3 12/04/2014 1202   MCH 29.7 12/04/2014 1202   MCHC 32.6 12/04/2014 1202   RDW 19.3* 12/04/2014 1202   LYMPHSABS 1.5 12/04/2014 1202   MONOABS 0.7 12/04/2014 1202   EOSABS 3.1* 12/04/2014 1202   BASOSABS 0.0 12/04/2014 1202      Assessment: 1. ARF ? Secondary to vanco, ? interstitial nephritis as had some pyuria and mild eosinophilia.  Now off vanco.  UO fair, Scr trending down 2. Hypokalemia, improved yest 3. Met acidosis on bicarb 4. Hypomag, SP replacement   Plan: 1. Replace K 2. Daily Scr [place] T

## 2014-12-05 NOTE — Progress Notes (Signed)
Patient pulled out NGT approx 15 minutes after restraints were removed. PICC line remains intact. Soft wrist restraint placed on right wrist; Dr. Selena Batten notified. New orders received from Dr. Selena Batten to continued RW restraint, but to not attempt to reinsert NGT until 2000 or 2100 this evening. Patient also moved to 6E16 for safety and placed on tele box #16.   Leanna Battles, RN.

## 2014-12-05 NOTE — Progress Notes (Signed)
Patient attempts to pull on tubes and PICC line. Continues to refuse NG tube placement at this time. Dr Selena Batten notified. New orders received. Patient placed on bilateral soft wrist restraints. Will continue to monitor.

## 2014-12-06 LAB — GLUCOSE, CAPILLARY
Glucose-Capillary: 131 mg/dL — ABNORMAL HIGH (ref 65–99)
Glucose-Capillary: 133 mg/dL — ABNORMAL HIGH (ref 65–99)
Glucose-Capillary: 135 mg/dL — ABNORMAL HIGH (ref 65–99)
Glucose-Capillary: 139 mg/dL — ABNORMAL HIGH (ref 65–99)
Glucose-Capillary: 169 mg/dL — ABNORMAL HIGH (ref 65–99)

## 2014-12-06 LAB — CBC WITH DIFFERENTIAL/PLATELET
BASOS ABS: 0.1 10*3/uL (ref 0.0–0.1)
BASOS PCT: 0 % (ref 0–1)
Eosinophils Absolute: 3 10*3/uL — ABNORMAL HIGH (ref 0.0–0.7)
Eosinophils Relative: 24 % — ABNORMAL HIGH (ref 0–5)
HCT: 29.6 % — ABNORMAL LOW (ref 36.0–46.0)
Hemoglobin: 9.5 g/dL — ABNORMAL LOW (ref 12.0–15.0)
Lymphocytes Relative: 15 % (ref 12–46)
Lymphs Abs: 1.9 10*3/uL (ref 0.7–4.0)
MCH: 29.6 pg (ref 26.0–34.0)
MCHC: 32.1 g/dL (ref 30.0–36.0)
MCV: 92.2 fL (ref 78.0–100.0)
MONOS PCT: 5 % (ref 3–12)
Monocytes Absolute: 0.7 10*3/uL (ref 0.1–1.0)
NEUTROS ABS: 7.1 10*3/uL (ref 1.7–7.7)
Neutrophils Relative %: 56 % (ref 43–77)
PLATELETS: 308 10*3/uL (ref 150–400)
RBC: 3.21 MIL/uL — ABNORMAL LOW (ref 3.87–5.11)
RDW: 19.4 % — ABNORMAL HIGH (ref 11.5–15.5)
WBC: 12.8 10*3/uL — AB (ref 4.0–10.5)

## 2014-12-06 LAB — BASIC METABOLIC PANEL
ANION GAP: 11 (ref 5–15)
BUN: 30 mg/dL — AB (ref 6–20)
CO2: 23 mmol/L (ref 22–32)
Calcium: 8.2 mg/dL — ABNORMAL LOW (ref 8.9–10.3)
Chloride: 108 mmol/L (ref 101–111)
Creatinine, Ser: 2.16 mg/dL — ABNORMAL HIGH (ref 0.44–1.00)
GFR calc Af Amer: 23 mL/min — ABNORMAL LOW (ref >60)
GFR calc non Af Amer: 20 mL/min — ABNORMAL LOW (ref >60)
GLUCOSE: 133 mg/dL — AB (ref 65–99)
POTASSIUM: 3.2 mmol/L — AB (ref 3.5–5.1)
SODIUM: 142 mmol/L (ref 135–145)

## 2014-12-06 MED ORDER — DIPHENHYDRAMINE HCL 50 MG/ML IJ SOLN
25.0000 mg | Freq: Four times a day (QID) | INTRAMUSCULAR | Status: DC | PRN
  Administered 2014-12-06 – 2014-12-08 (×2): 25 mg via INTRAVENOUS
  Filled 2014-12-06 (×2): qty 1

## 2014-12-06 MED ORDER — WHITE PETROLATUM GEL
Status: AC
  Administered 2014-12-06: 08:00:00
  Filled 2014-12-06: qty 1

## 2014-12-06 MED ORDER — POTASSIUM CHLORIDE 10 MEQ/100ML IV SOLN
10.0000 meq | INTRAVENOUS | Status: AC
  Administered 2014-12-06 (×3): 10 meq via INTRAVENOUS
  Filled 2014-12-06 (×3): qty 100

## 2014-12-06 MED ORDER — PANTOPRAZOLE SODIUM 40 MG IV SOLR
40.0000 mg | Freq: Every day | INTRAVENOUS | Status: DC
  Administered 2014-12-07 – 2014-12-09 (×2): 40 mg via INTRAVENOUS
  Filled 2014-12-06 (×6): qty 40

## 2014-12-06 MED ORDER — INSULIN ASPART 100 UNIT/ML ~~LOC~~ SOLN
0.0000 [IU] | Freq: Four times a day (QID) | SUBCUTANEOUS | Status: DC
  Administered 2014-12-06: 2 [IU] via SUBCUTANEOUS
  Administered 2014-12-07 (×2): 3 [IU] via SUBCUTANEOUS
  Administered 2014-12-08: 2 [IU] via SUBCUTANEOUS
  Administered 2014-12-08 (×2): 3 [IU] via SUBCUTANEOUS
  Administered 2014-12-09 (×3): 1 [IU] via SUBCUTANEOUS
  Administered 2014-12-10 (×3): 2 [IU] via SUBCUTANEOUS

## 2014-12-06 MED ORDER — CLINIMIX E/DEXTROSE (5/15) 5 % IV SOLN
INTRAVENOUS | Status: AC
  Administered 2014-12-06: 18:00:00 via INTRAVENOUS
  Filled 2014-12-06: qty 960

## 2014-12-06 MED ORDER — CAMPHOR-MENTHOL 0.5-0.5 % EX LOTN
TOPICAL_LOTION | CUTANEOUS | Status: DC | PRN
  Administered 2014-12-06: 14:00:00 via TOPICAL
  Filled 2014-12-06: qty 222

## 2014-12-06 NOTE — Progress Notes (Signed)
PARENTERAL NUTRITION CONSULT NOTE  Pharmacy Consult for TPN Indication: refusal of po intake and no enteral access  No Known Allergies  Patient Measurements: Height: 5' 5"  (165.1 cm) Weight: 207 lb 12.8 oz (94.257 kg) IBW/kg (Calculated) : 57 Adjusted Body Weight: 69kg  Vital Signs: Temp: 98.5 F (36.9 C) (06/11 0827) Temp Source: Oral (06/11 0827) BP: 156/60 mmHg (06/11 0827) Pulse Rate: 75 (06/11 0827) Intake/Output from previous day: 06/10 0701 - 06/11 0700 In: 287.5 [NG/GT:287.5] Out: 1325 [Urine:1325] Intake/Output from this shift:    Labs:  Recent Labs  12/04/14 0618 12/04/14 1202  WBC 12.6* 13.4*  HGB 9.9* 10.2*  HCT 30.4* 31.3*  PLT 288 291  INR 1.99* 2.05*     Recent Labs  12/04/14 1202 12/05/14 0610 12/06/14 0400  NA 140 141 142  K 3.9 3.4* 3.2*  CL 111 109 108  CO2 18* 20* 23  GLUCOSE 138* 164* 133*  BUN 26* 26* 30*  CREATININE 2.51* 2.36* 2.16*  CALCIUM 8.3* 8.3* 8.2*  MG  --  1.9  --   PHOS  --  3.9  --   PROT 4.4* 4.3*  --   ALBUMIN 1.7* 1.8*  --   AST 22 24  --   ALT 13* 15  --   ALKPHOS 100 101  --   BILITOT 0.8 0.5  --    Estimated Creatinine Clearance: 23.2 mL/min (by C-G formula based on Cr of 2.16).    Recent Labs  12/06/14 0039 12/06/14 0540 12/06/14 0735  GLUCAP 135* 131* 139*    Medical History: Past Medical History  Diagnosis Date  . Diabetes mellitus   . Hypertension   . Osteopenia   . Peripheral vascular disease, unspecified   . GERD (gastroesophageal reflux disease)   . Arthritis   . Shortness of breath     exersion  . Pneumonia     child  . Anemia     hx  . Fall at home October 09, 2013  . DM (diabetes mellitus) type II uncontrolled, periph vascular disorder 09/17/2014  . Hepatitis     "a"    Insulin Requirements in the past 24 hours:  SSI - none used in 24h  Assessment: 31 yof admitted from SNF for elevated SCr and leukocytosis. Pharmacy was consulted to initiate TPN for reasons of poor oral  intake and hypoglycemia. Noted patient is on dysphagia 3 diet and has refused oral intake for ~7 days. GI tract intact. Per ASPEN guidelines and RD recommendations, TPN was appropriately deferred. - 6/9: Dr. Maudie Mercury called TPN pharmacist on 6/9 to re-consult for TPN due to no PEG tube placement. However pt pulled out PICC line. No TPN access. Spoke with RD Colletta Maryland who received orders from Dr. Maudie Mercury to start tube feeds as pt had NGT placed on 6/9. - 6/10: MD consult: IR didn't want to place PEG, afraid she might pull out, very poor nutritional status, thanks.  I have discussed this w pharmacy prev, pt pulled NGT tube out (refused reinsertion), we made attempt at enteral feeding. too  GI: Prealbumin wnl 27. Progressive FTT per MD. Started enteral feeds on 6/9, then pulled out NG tube. Now pulled at PICC line. SLP eva: Declined. Meds: PPI, senna-docusate  Endo: DM. A1c 5.8. Multiple hypoglycemic episodes with D50 given on 6/7. CBGs 131-180. No SSI charted.  Lytes:  K 3.2 lower (to replace PICC per MD note) only 1 dose of 20mq received 6/10, other lytes wnl  Renal: AKI on admit (?  Vanco?, ATN), SCr down to 2.51>>2.16, CrCl~23, UOP 0.6. Lasix po, bicarb for metabolic acidosis  Cards: CHf (EF 35-45%) HLD, BP 156/60, HR 75 Meds: asa81, po Lasix, hydralazine, isordil, Toprol XL, crestor  Hepatobil: LFTs/alk phos/tbili wnl, TG 182 elevated  Neuro: acute encephalopathy, dementia - confused and agitated  ID: s/p 6 days zosyn for UTI vs. Asp pna. cdiff neg. UC ngf. Afebrile, wbc decreased to 13.4  AC: PAF - CHADSVASC 7 - not anticoag candidate per cards. INR 2.05  Best Practices: heparin [place], ppi po  TPN Access: Long-term PICC pulled and replaced 6/8; pulled out again by patient 6/10. PICC line currently intact 6/11  TPN start date: 6/11  Current Nutrition:  TF stopped 6/10 after NG tube pulled out.  Nutritional Goals:  1725-1900 kCal, 100-120 grams of protein per day Clinimix E 5/15 at 83 ml/hr with  lipids on MWF averages: 1619 kcal/day and 99.6g protein daily  Plan:  - Start Clinimix E 5/15 at 93m/hr tonight. No lipids. - At $1635/day with a functioning gut, TPN is not a long-term option. - Kruns x 3 ordered by MD - Recheck labs in AM. - Add SSI - Palliative care consult?   Kwamane Whack S. RAlford Highland PharmD, BWesterville Endoscopy Center LLCClinical Staff Pharmacist Pager [phone]  12/06/2014 9:25 AM

## 2014-12-06 NOTE — Progress Notes (Signed)
Notified Dr. Selena Batten of patient complaining of feeling itchy all over her arms/chest. Appears to have a small rash, but could just be from patient scratching. Patient's left wrist is currently out of restraints - Dr. Selena Batten aware and okay with this. New orders received for prn benadryl and sarna lotion. Also asked about patient's PO medications which she is currently unable to take; he stated he ordered a new speech eval and will go by their recommendations. Will continue to monitor and keep patient comfortable.  Leanna Battles, RN.

## 2014-12-06 NOTE — Progress Notes (Signed)
S: Pull out feeding tube O:BP 156/60 mmHg  Pulse 75  Temp(Src) 98.5 F (36.9 C) (Oral)  Resp 17  Ht _0  (1.651 m)  Wt 94.257 kg (207 lb 12.8 oz)  BMI 34.58 kg/m2  SpO2 98%  Intake/Output Summary (Last 24 hours) at 12/06/14 0951 Last data filed at 12/06/14 5916  Gross per 24 hour  Intake  197.5 ml  Output   1025 ml  Net -827.5 ml   Weight change: -3.81 kg (-8 lb 6.4 oz) Gen: sleeping CVS:RRR Resp: Clear ant Abd:+ BS NTND Ext: 1+ edema Skin Faint rash with excoriations arms and upper chest NEURO: No asterixis Lt PICC line    . aspirin  81 mg Oral Daily  . cholecalciferol  1,000 Units Oral Daily  . feeding supplement (PRO-STAT SUGAR FREE 64)  30 mL Per Tube Daily  . furosemide  40 mg Oral BID  . heparin subcutaneous  5,000 Units Subcutaneous 3 times per day  . hydrALAZINE  10 mg Oral 3 times per day  . insulin aspart  0-5 Units Subcutaneous QHS  . isosorbide dinitrate  5 mg Oral TID  . metoprolol succinate  12.5 mg Oral Daily  . pantoprazole  40 mg Oral QHS  . potassium chloride  10 mEq Intravenous Q1 Hr x 3  . potassium chloride SA  20 mEq Oral BID  . rosuvastatin  5 mg Oral q1800  . senna-docusate  1 tablet Oral BID  . sodium bicarbonate  650 mg Oral TID   Dg Abd 1 View  12/04/2014   CLINICAL DATA:  Abdominal pain.  Nasogastric tube placement  EXAM: ABDOMEN - 1 VIEW  COMPARISON:  12/04/2014  FINDINGS: Nasogastric tube is seen with tip overlying the gastric antrum or pylorus. No evidence of dilated bowel loops.  IMPRESSION: Nasogastric tube tip overlying the gastric antrum or pylorus.   Electronically Signed   By: Earle Gell M.D.   On: 12/04/2014 21:42   Dg Abd 1 View  12/04/2014   CLINICAL DATA:  NG tube placement.  EXAM: ABDOMEN - 1 VIEW  COMPARISON:  Earlier film, same date.  FINDINGS: The NG tube tip is in the pyloric region or duodenum bulb. The bowel gas pattern is unremarkable.  IMPRESSION: NG tube tip is in the region of the pylorus or duodenum bulb.    Electronically Signed   By: Marijo Sanes M.D.   On: 12/04/2014 16:25   Dg Abd 1 View  12/04/2014   CLINICAL DATA:  Nasogastric tube placement by ede.  EXAM: NASO G TUBE PLACEMENT WITH FL AND WITH RAD; ABDOMEN - 1 VIEW  FLUOROSCOPY TIME:  Fluoroscopy Time (in minutes and seconds): 4 seconds  Number of Acquired Images:  1  COMPARISON:  None  FINDINGS: A single fluoro spot images reveals an esophagogastric tube whose tip lies in the region of the distal stomach and proximal port well below the expected location of the GE junction.  IMPRESSION: Nasogastric tube which appears be appropriately positioned within the stomach.   Electronically Signed   By: David  Martinique M.D.   On: 12/04/2014 10:20   Dg Loyce Dys Tube Plc W/fl W/rad  12/04/2014   CLINICAL DATA:  Nasogastric tube placement by ede.  EXAM: NASO G TUBE PLACEMENT WITH FL AND WITH RAD; ABDOMEN - 1 VIEW  FLUOROSCOPY TIME:  Fluoroscopy Time (in minutes and seconds): 4 seconds  Number of Acquired Images:  1  COMPARISON:  None  FINDINGS: A single fluoro spot images reveals  an esophagogastric tube whose tip lies in the region of the distal stomach and proximal port well below the expected location of the GE junction.  IMPRESSION: Nasogastric tube which appears be appropriately positioned within the stomach.   Electronically Signed   By: David  Martinique M.D.   On: 12/04/2014 10:20   BMET    Component Value Date/Time   NA 142 12/06/2014 0400   K 3.2* 12/06/2014 0400   CL 108 12/06/2014 0400   CO2 23 12/06/2014 0400   GLUCOSE 133* 12/06/2014 0400   BUN 30* 12/06/2014 0400   CREATININE 2.16* 12/06/2014 0400   CALCIUM 8.2* 12/06/2014 0400   GFRNONAA 20* 12/06/2014 0400   GFRAA 23* 12/06/2014 0400   CBC    Component Value Date/Time   WBC 13.4* 12/04/2014 1202   RBC 3.43* 12/04/2014 1202   RBC 3.68* 11/26/2014 0913   HGB 10.2* 12/04/2014 1202   HCT 31.3* 12/04/2014 1202   HCT 24.7* 09/29/2014 0542   PLT 291 12/04/2014 1202   MCV 91.3 12/04/2014 1202    MCH 29.7 12/04/2014 1202   MCHC 32.6 12/04/2014 1202   RDW 19.3* 12/04/2014 1202   LYMPHSABS 1.5 12/04/2014 1202   MONOABS 0.7 12/04/2014 1202   EOSABS 3.1* 12/04/2014 1202   BASOSABS 0.0 12/04/2014 1202     Assessment: 1. ARF ? Secondary to vanco, ? interstitial nephritis as had some pyuria and mild eosinophilia.  Now off vanco.  UO fair, Scr cont to trend down 2. Hypokalemia, 3. Met acidosis on bicarb 4. Hypomag, SP replacement   Plan: 1. Replace K 2. Daily Scr 3. I am adding nothing at this time. Renal fx should cont to improve.  Again, given her underlying dementia a conservative approach would be prudent.  I will sign off, call if further renal issues Donovin Kraemer T

## 2014-12-06 NOTE — Progress Notes (Signed)
Speech Language Pathology Treatment: Dysphagia  Patient Details Name: Tina [name] MRN: [id number] DOB: 04-Oct-1930 Today's Date: 12/06/2014 Time: 7218-2883 SLP Time Calculation (min) (ACUTE ONLY): 34 min  Assessment / Plan / Recommendation Clinical Impression  New orders received for swallow evaluation.  Pt has remained on our caseload, but has been minimally participatory, refusing solid foods, has pulled NG tube.  Daughter present during session.  Pt begging for water;  improved mentation today.  Consumed several cups of water with only occasional, intermittent delayed cough (<10% of swallows), normal oral phase and attention to material. Consumed crackers with no s/s of difficulty and adequate mastication.  With hx of dementia, fluctuating MS, aspiration is an inherent risk.  Discussed risk with daughter, who verbalizes understanding.  Pt's daughter mentioned multiple times that she is agreeable to pursuing PEG if necessary.  We discussed the value of educating herself re: the benefits/burdens of PEGs, particularly in a person with dementia, for whom PEGs are generally contraindicated.  The daughter, describing herself as a Engineer, civil (consulting), asserts that she understands and that she and her mother are not opposed to a PEG.    Recommend allowing full liquid diet per pt's request at this time.  Please advance per preferences (capable of eating regular diet should she desire).  No further SLP intervention is warranted.  Explained to dtr and pt's RN that we will sign off.     HPI Other Pertinent Information: 81 year old female with past medical history of dementia, diabetes, hypertension, neurogenic claudication, s/p laminectomy by Dr Jeral Fruit in March 2016, subsequently fell and had left subcapital femoral neck fracture, has had ORIF (done by Dr. Margarita Rana) and then sent to SNF. While there, she was found to have leukocytosis and increasing creatinine. Pt was observed coughing with PO intake.    Pertinent Vitals Pain Assessment: No/denies pain  SLP Plan  D/C SLP   Recommendations Diet recommendations:  (full liquids) Liquids provided via: Cup Medication Administration: Crushed with puree Supervision: Patient able to self feed;Full supervision/cueing for compensatory strategies Compensations: Slow rate;Small sips/bites Postural Changes and/or Swallow Maneuvers: Seated upright 90 degrees              Oral Care Recommendations: Oral care BID Follow up Recommendations: Skilled Nursing facility Plan: Continue with current plan of care    GO     Tina [name] 12/06/2014, 5:38 PM

## 2014-12-06 NOTE — Progress Notes (Signed)
Per verbal orders from Dr. Selena Batten, ok to renew wrist restraint.  Will put in order.  Will continue to monitor.

## 2014-12-06 NOTE — Progress Notes (Signed)
Subjective: Afebrile overnite, pt pulled out NGT.   CHF (ef 35-40%)  Pt denies cp, palp, sob. ARF: Followed by nephrology, improving Hypoglycemia: stable Hypokalemia: On potassium, Encephalopathy: much improved. Pt is able to converse.   Objective: Vital signs in last 24 hours: Temp:  [97.7 F (36.5 C)-98.7 F (37.1 C)] 98 F (36.7 C) (06/11 0542) Pulse Rate:  [76-86] 76 (06/11 0542) Resp:  [17-18] 17 (06/11 0542) BP: (128-151)/(41-56) 146/56 mmHg (06/11 0542) SpO2:  [93 %-97 %] 95 % (06/11 0542) Weight:  [94.257 kg (207 lb 12.8 oz)] 94.257 kg (207 lb 12.8 oz) (06/11 0542) Weight change: -3.81 kg (-8 lb 6.4 oz) Last BM Date: 12/05/14  Intake/Output from previous day: 06/10 0701 - 06/11 0700 In: 287.5 [NG/GT:287.5] Out: 1325 [Urine:1325] Intake/Output this shift:    Heent: anicteric Neck: no jvd Heart: rrr s1, s2 Lung: ctab Abd: soft, nt, nd, +bs Ext: no c/c/e  Lab Results:  Recent Labs  12/04/14 0618 12/04/14 1202  WBC 12.6* 13.4*  HGB 9.9* 10.2*  HCT 30.4* 31.3*  PLT 288 291   BMET  Recent Labs  12/05/14 0610 12/06/14 0400  NA 141 142  K 3.4* 3.2*  CL 109 108  CO2 20* 23  GLUCOSE 164* 133*  BUN 26* 30*  CREATININE 2.36* 2.16*  CALCIUM 8.3* 8.2*    Studies/Results: Ct Abdomen Wo Contrast  12/04/2014   CLINICAL DATA:  81 year old diabetic hypertensive female with history of hepatitis presenting with malnutrition. Evaluate for percutaneous gastrostomy tube. Prior appendectomy. Initial encounter.  EXAM: CT ABDOMEN WITHOUT CONTRAST  TECHNIQUE: Multidetector CT imaging of the abdomen was performed following the standard protocol without IV contrast.  COMPARISON:  09/30/2010 CT.  FINDINGS: Under distended stomach is in a normal position. Slightly prominent vessels anterior to the lower aspect of the gastric body/pyloric region.  No extra luminal bowel inflammatory process, free fluid or free air.  Bilateral pleural effusions with basilar atelectasis  greater on left.  Coronary artery calcification. Calcification mitral valve and aortic valve. Heart size within normal limits.  Calcified mildly ectatic abdominal aorta without aneurysmal dilation. Calcification proximal common iliac arteries.  Streak artifact and lack of intravenous contrast limited evaluation. No obvious focal hepatic, splenic, pancreatic, renal or adrenal abnormality. No calcified gallstones.  Degenerative changes lower thoracic lumbar spine. Prior lumbar laminectomy.  IMPRESSION: Under distended stomach is in a normal position. Slightly prominent vessels anterior to the lower aspect of the gastric body/pyloric region. No extra luminal bowel inflammatory process, free fluid or free air.  Bilateral pleural effusions with basilar atelectasis greater on left.  Coronary artery calcification. Calcification mitral valve and aortic valve.  Calcified mildly ectatic abdominal aorta without aneurysmal dilation. Calcification proximal common iliac arteries.  Degenerative changes lower thoracic lumbar spine. Prior lumbar laminectomy.   Electronically Signed   By: Lacy Duverney M.D.   On: 12/04/2014 08:07   Dg Abd 1 View  12/04/2014   CLINICAL DATA:  Abdominal pain.  Nasogastric tube placement  EXAM: ABDOMEN - 1 VIEW  COMPARISON:  12/04/2014  FINDINGS: Nasogastric tube is seen with tip overlying the gastric antrum or pylorus. No evidence of dilated bowel loops.  IMPRESSION: Nasogastric tube tip overlying the gastric antrum or pylorus.   Electronically Signed   By: Myles Rosenthal M.D.   On: 12/04/2014 21:42   Dg Abd 1 View  12/04/2014   CLINICAL DATA:  NG tube placement.  EXAM: ABDOMEN - 1 VIEW  COMPARISON:  Earlier film, same date.  FINDINGS: The  NG tube tip is in the pyloric region or duodenum bulb. The bowel gas pattern is unremarkable.  IMPRESSION: NG tube tip is in the region of the pylorus or duodenum bulb.   Electronically Signed   By: Rudie Meyer M.D.   On: 12/04/2014 16:25   Dg Abd 1  View  12/04/2014   CLINICAL DATA:  Nasogastric tube placement by ede.  EXAM: NASO G TUBE PLACEMENT WITH FL AND WITH RAD; ABDOMEN - 1 VIEW  FLUOROSCOPY TIME:  Fluoroscopy Time (in minutes and seconds): 4 seconds  Number of Acquired Images:  1  COMPARISON:  None  FINDINGS: A single fluoro spot images reveals an esophagogastric tube whose tip lies in the region of the distal stomach and proximal port well below the expected location of the GE junction.  IMPRESSION: Nasogastric tube which appears be appropriately positioned within the stomach.   Electronically Signed   By: David  [place] M.D.   On: 12/04/2014 10:20   Dg Basil Dess Tube Plc W/fl W/rad  12/04/2014   CLINICAL DATA:  Nasogastric tube placement by ede.  EXAM: NASO G TUBE PLACEMENT WITH FL AND WITH RAD; ABDOMEN - 1 VIEW  FLUOROSCOPY TIME:  Fluoroscopy Time (in minutes and seconds): 4 seconds  Number of Acquired Images:  1  COMPARISON:  None  FINDINGS: A single fluoro spot images reveals an esophagogastric tube whose tip lies in the region of the distal stomach and proximal port well below the expected location of the GE junction.  IMPRESSION: Nasogastric tube which appears be appropriately positioned within the stomach.   Electronically Signed   By: David  [place] M.D.   On: 12/04/2014 10:20    Medications: I have reviewed the patient's current medications.  Assessment/Plan: Acute on CRF Secondary to ATN Pt is being followed by nephrology, creatinine is trending down Metabolic acidosis on bicarbonate   Hypokalemia Replete, check cmp in am  Leukocytosis ? Due to uti vs aspiration pneumonia Wbc stable  PSVT Started on toprol xl 11/30/2014, rate improved.   Severe protein calorie malnutrition IR thought TPN would be a better choice. Pharmacy decided that they wanted to hold off on TPN Due to NGT in place, but pt pulled this out.  We are requesting TPN consult again today  Hypoglycemia Hopefully this will improve with nutrition,   Pafib  (CHADSVASC 7) Appreciate cardiology input.   CHF (EF 35-45%) Appreciate cardiology input  Dm2 Hga1c=5.8  Hyperlipidemia On lower dose than normal of crestor due to renal insufficiency  Aspiration pneumonia Speech therapy to reevaluate swallowing to see if can have diet Off abx Will monitor  Anemia Repeat cbc in am  DVT prophylaxis: heparin [place]  LOS: 10 days   Pearson Grippe 12/06/2014, 7:38 AM

## 2014-12-07 LAB — GLUCOSE, CAPILLARY
GLUCOSE-CAPILLARY: 183 mg/dL — AB (ref 65–99)
GLUCOSE-CAPILLARY: 195 mg/dL — AB (ref 65–99)
GLUCOSE-CAPILLARY: 237 mg/dL — AB (ref 65–99)
GLUCOSE-CAPILLARY: 243 mg/dL — AB (ref 65–99)

## 2014-12-07 MED ORDER — FUROSEMIDE 10 MG/ML IJ SOLN
20.0000 mg | Freq: Two times a day (BID) | INTRAMUSCULAR | Status: DC
  Administered 2014-12-07 – 2014-12-10 (×3): 20 mg via INTRAVENOUS
  Filled 2014-12-07 (×7): qty 2

## 2014-12-07 MED ORDER — TRACE MINERALS CR-CU-F-FE-I-MN-MO-SE-ZN IV SOLN
INTRAVENOUS | Status: AC
  Administered 2014-12-07: 17:00:00 via INTRAVENOUS
  Filled 2014-12-07: qty 960

## 2014-12-07 MED ORDER — POTASSIUM CHLORIDE CRYS ER 20 MEQ PO TBCR
20.0000 meq | EXTENDED_RELEASE_TABLET | Freq: Every day | ORAL | Status: DC
  Administered 2014-12-07 – 2014-12-10 (×4): 20 meq via ORAL
  Filled 2014-12-07 (×5): qty 1

## 2014-12-07 MED ORDER — FUROSEMIDE 20 MG PO TABS
20.0000 mg | ORAL_TABLET | Freq: Two times a day (BID) | ORAL | Status: DC
  Filled 2014-12-07 (×3): qty 1

## 2014-12-07 MED ORDER — FUROSEMIDE 40 MG PO TABS
40.0000 mg | ORAL_TABLET | Freq: Two times a day (BID) | ORAL | Status: DC
  Administered 2014-12-07: 40 mg via ORAL
  Filled 2014-12-07 (×3): qty 1

## 2014-12-07 NOTE — Progress Notes (Signed)
Patient refusing medications last night and this morning.  Also refused lab draws this morning.  Will notify Dr. Selena Batten.  Will continue to monitor.

## 2014-12-07 NOTE — Progress Notes (Signed)
Attempted to call Dr. Selena Batten.  He stated he will be at the unit very soon and will speak to RN at that time.  Will pass on information to day shift RN, if unable to speak to Dr. Selena Batten.

## 2014-12-07 NOTE — Progress Notes (Signed)
PARENTERAL NUTRITION CONSULT NOTE  Pharmacy Consult for TPN Indication: refusal of po intake and no enteral access  No Known Allergies  Patient Measurements: Height: _0  (165.1 cm) Weight: 210 lb (95.255 kg) IBW/kg (Calculated) : 57 Adjusted Body Weight: 69kg  Vital Signs: Temp: 98.5 F (36.9 C) (06/12 0510) Temp Source: Oral (06/12 0510) BP: 144/53 mmHg (06/12 0510) Pulse Rate: 86 (06/12 0510) Intake/Output from previous day: 06/11 0701 - 06/12 0700 In: 1270 [P.O.:480; IV Piggyback:300; TPN:490] Out: 1025 [Urine:1025] Intake/Output from this shift:    Labs:  Recent Labs  12/04/14 1202 12/06/14 1145  WBC 13.4* 12.8*  HGB 10.2* 9.5*  HCT 31.3* 29.6*  PLT 291 308  INR 2.05*  --      Recent Labs  12/04/14 1202 12/05/14 0610 12/06/14 0400  NA 140 141 142  K 3.9 3.4* 3.2*  CL 111 109 108  CO2 18* 20* 23  GLUCOSE 138* 164* 133*  BUN 26* 26* 30*  CREATININE 2.51* 2.36* 2.16*  CALCIUM 8.3* 8.3* 8.2*  MG  --  1.9  --   PHOS  --  3.9  --   PROT 4.4* 4.3*  --   ALBUMIN 1.7* 1.8*  --   AST 22 24  --   ALT 13* 15  --   ALKPHOS 100 101  --   BILITOT 0.8 0.5  --    Estimated Creatinine Clearance: 23.3 mL/min (by C-G formula based on Cr of 2.16).    Recent Labs  12/06/14 1845 12/07/14 0021 12/07/14 0631  GLUCAP 169* 183* 195*    Medical History: Past Medical History  Diagnosis Date  . Diabetes mellitus   . Hypertension   . Osteopenia   . Peripheral vascular disease, unspecified   . GERD (gastroesophageal reflux disease)   . Arthritis   . Shortness of breath     exersion  . Pneumonia     child  . Anemia     hx  . Fall at home October 09, 2013  . DM (diabetes mellitus) type II uncontrolled, periph vascular disorder 09/17/2014  . Hepatitis     "a"    Insulin Requirements in the past 24 hours:  2 units SSI since TPN started (CBG 169, 183, 195)  Assessment: 83 yof admitted from SNF for elevated SCr and leukocytosis. Pharmacy was consulted to  initiate TPN for reasons of poor oral intake and hypoglycemia. Noted patient is on dysphagia 3 diet and has refused oral intake for ~7 days. GI tract intact. Per ASPEN guidelines and RD recommendations, TPN was appropriately deferred. - 6/9: Dr. Maudie Mercury called TPN pharmacist on 6/9 to re-consult for TPN due to no PEG tube placement. However pt pulled out PICC line. No TPN access. Spoke with RD Colletta Maryland who received orders from Dr. Maudie Mercury to start tube feeds as pt had NGT placed on 6/9. - 6/10: MD consult: IR didn't want to place PEG, afraid she might pull out, very poor nutritional status, thanks.  I have discussed this w pharmacy prev, pt pulled NGT tube out (refused reinsertion), we made attempt at enteral feeding. Too - 6/11: Please see speech consult  GI: Prealbumin wnl 27. Progressive FTT per MD. Started enteral feeds on 6/9, then pulled out NG tube. Now pulled at PICC line. Pass swallow eval on 6/11 with daughter agreeable to try PEG. Patient continues to refuse her po meds and her lab draws. Small amounts of po intake noted 6/11 and 6/12. Meds: PPI, senna-docusate  Endo: DM. A1c  5.8. Multiple hypoglycemic episodes with D50 given on 6/7. CBGs 169, 183, 195 since TPN started 6/11.  Lytes:  K 3.2 lower. Gave Kruns x 3 on 6/11. Patient refusing labs currently.  Renal: AKI on admit (?Vanco?, ATN), SCr down to 2.51>>2.16, CrCl~23, UOP 0.4. Lasix po, bicarb for metabolic acidosis  Cards: CHf (EF 35-45%) HLD, BP 156/60, HR 75 Meds: asa81, po Lasix, hydralazine, isordil, Toprol XL, crestor  Hepatobil: LFTs/alk phos/tbili wnl, TG 182 elevated  Neuro: acute encephalopathy, dementia - confused and agitated  ID: s/p 6 days zosyn for UTI vs. Asp pna. cdiff neg. UC ngf. Afebrile, wbc decreased to 13.4  AC: PAF - CHADSVASC 7 - not anticoag candidate per cards. INR 2.05  Best Practices: heparin [place], ppi po  TPN Access: Long-term PICC pulled and replaced 6/8; pulled out again by patient 6/10. PICC line  currently intact 6/11  TPN start date: 6/11  Current Nutrition:  TF stopped 6/10 after NG tube pulled out.  Nutritional Goals:  1725-1900 kCal, 100-120 grams of protein per day Clinimix E 5/15 at 83 ml/hr with lipids on MWF averages: 1619 kcal/day and 99.6g protein daily  Plan:  - Continue Clinimix E 5/15 at 79m/hr tonight. No lipids. I do not feel I can safely increase the rate at this time if patient is refusing labs. She needs to be scrutinized for refeeding syndrome and electrolyte replacement. - At $1635/day with a functioning gut, TPN is not a long-term option. - Recheck labs in AM. - Add SSI - Palliative care consult?   Magdalena Skilton S. RAlford Highland PharmD, BEmory Johns Creek HospitalClinical Staff Pharmacist Pager [phone]  12/07/2014 7:56 AM

## 2014-12-07 NOTE — Progress Notes (Signed)
Subjective: Afebrile overnite, pt pulled out NGT yesterday, on TPN, seems to be doing well, confused at times CHF (ef 35-40%) Pt denies cp, palp, sob. ARF: Followed by nephrology, improving Hypoglycemia: stable Hypokalemia: On potassium, Encephalopathy: much improved. Pt is able to converse.   Objective: Vital signs in last 24 hours: Temp:  [98.3 F (36.8 C)-98.5 F (36.9 C)] 98.5 F (36.9 C) (06/12 0510) Pulse Rate:  [75-91] 86 (06/12 0510) Resp:  [17-25] 25 (06/12 0510) BP: (144-156)/(50-66) 144/53 mmHg (06/12 0510) SpO2:  [96 %-100 %] 96 % (06/12 0510) Weight:  [95.255 kg (210 lb)] 95.255 kg (210 lb) (06/11 2104) Weight change: 0.998 kg (2 lb 3.2 oz) Last BM Date: 12/05/14  Intake/Output from previous day: 06/11 0701 - 06/12 0700 In: 1110 [P.O.:480; IV Piggyback:300; TPN:330] Out: 1025 [Urine:1025] Intake/Output this shift:    Heent: anicteric Neck: no jvd Heart: rrr s1, s2 Lung: ctab Abd: soft, nt, nd, +bs Ext: no c/c/e picc in the left arm  Lab Results:  Recent Labs  12/04/14 1202 12/06/14 1145  WBC 13.4* 12.8*  HGB 10.2* 9.5*  HCT 31.3* 29.6*  PLT 291 308   BMET  Recent Labs  12/05/14 0610 12/06/14 0400  NA 141 142  K 3.4* 3.2*  CL 109 108  CO2 20* 23  GLUCOSE 164* 133*  BUN 26* 30*  CREATININE 2.36* 2.16*  CALCIUM 8.3* 8.2*    Studies/Results: No results found.  Medications: I have reviewed the patient's current medications.  Assessment/Plan: Acute on CRF Secondary to ATN Pt is being followed by nephrology, creatinine is trending down Metabolic acidosis on bicarbonate   Hypokalemia Replete, check cmp in am  Leukocytosis ? Due to uti vs aspiration pneumonia Wbc stable  PSVT Started on toprol xl 11/30/2014, rate improved.   Severe protein calorie malnutrition Cont tpn, appreciate pharmacy input   Hypoglycemia Hopefully this will improve with nutrition,   Pafib (CHADSVASC 7) Appreciate cardiology input.   CHF  (EF 35-45%) Appreciate cardiology input, using iv lasix, since pt refusing po medications intermittently  Dm2 Hga1c=5.8  Hyperlipidemia On lower dose than normal of crestor due to renal insufficiency  Aspiration pneumonia Speech therapy evaluated,  Appreciate input.  On liquid diet and will advance as tolerated  Anemia Repeat cbc in am  DVT prophylaxis: heparin [place]  LOS: 11 days   Pearson Grippe 12/07/2014, 7:24 AM

## 2014-12-08 LAB — COMPREHENSIVE METABOLIC PANEL
ALK PHOS: 90 U/L (ref 38–126)
ALT: 12 U/L — ABNORMAL LOW (ref 14–54)
ANION GAP: 9 (ref 5–15)
AST: 22 U/L (ref 15–41)
Albumin: 1.7 g/dL — ABNORMAL LOW (ref 3.5–5.0)
BUN: 25 mg/dL — AB (ref 6–20)
CO2: 25 mmol/L (ref 22–32)
Calcium: 8.1 mg/dL — ABNORMAL LOW (ref 8.9–10.3)
Chloride: 103 mmol/L (ref 101–111)
Creatinine, Ser: 1.67 mg/dL — ABNORMAL HIGH (ref 0.44–1.00)
GFR calc non Af Amer: 28 mL/min — ABNORMAL LOW (ref >60)
GFR, EST AFRICAN AMERICAN: 32 mL/min — AB (ref >60)
GLUCOSE: 200 mg/dL — AB (ref 65–99)
Potassium: 3.5 mmol/L (ref 3.5–5.1)
SODIUM: 137 mmol/L (ref 135–145)
TOTAL PROTEIN: 4.3 g/dL — AB (ref 6.5–8.1)
Total Bilirubin: 0.5 mg/dL (ref 0.3–1.2)

## 2014-12-08 LAB — PHOSPHORUS: Phosphorus: 2.9 mg/dL (ref 2.5–4.6)

## 2014-12-08 LAB — GLUCOSE, CAPILLARY
Glucose-Capillary: 164 mg/dL — ABNORMAL HIGH (ref 65–99)
Glucose-Capillary: 193 mg/dL — ABNORMAL HIGH (ref 65–99)
Glucose-Capillary: 221 mg/dL — ABNORMAL HIGH (ref 65–99)
Glucose-Capillary: 247 mg/dL — ABNORMAL HIGH (ref 65–99)

## 2014-12-08 LAB — PREALBUMIN: Prealbumin: 21.1 mg/dL (ref 18–38)

## 2014-12-08 LAB — MAGNESIUM: Magnesium: 1.4 mg/dL — ABNORMAL LOW (ref 1.7–2.4)

## 2014-12-08 LAB — TRIGLYCERIDES: Triglycerides: 167 mg/dL — ABNORMAL HIGH (ref <150)

## 2014-12-08 MED ORDER — TRACE MINERALS CR-CU-F-FE-I-MN-MO-SE-ZN IV SOLN
INTRAVENOUS | Status: AC
  Filled 2014-12-08: qty 960

## 2014-12-08 MED ORDER — POTASSIUM CHLORIDE 10 MEQ/50ML IV SOLN
10.0000 meq | INTRAVENOUS | Status: DC
  Filled 2014-12-08 (×4): qty 50

## 2014-12-08 MED ORDER — FAT EMULSION 20 % IV EMUL
240.0000 mL | INTRAVENOUS | Status: AC
  Filled 2014-12-08: qty 250

## 2014-12-08 MED ORDER — POTASSIUM CHLORIDE 10 MEQ/100ML IV SOLN
10.0000 meq | INTRAVENOUS | Status: AC
  Administered 2014-12-08 (×2): 10 meq via INTRAVENOUS
  Filled 2014-12-08 (×4): qty 100

## 2014-12-08 MED ORDER — GLUCERNA SHAKE PO LIQD
237.0000 mL | Freq: Three times a day (TID) | ORAL | Status: DC
  Administered 2014-12-08 – 2014-12-10 (×4): 237 mL via ORAL

## 2014-12-08 MED ORDER — MAGNESIUM SULFATE 2 GM/50ML IV SOLN
2.0000 g | Freq: Once | INTRAVENOUS | Status: AC
  Administered 2014-12-08: 2 g via INTRAVENOUS
  Filled 2014-12-08: qty 50

## 2014-12-08 MED ORDER — PRO-STAT SUGAR FREE PO LIQD
30.0000 mL | Freq: Two times a day (BID) | ORAL | Status: DC
  Filled 2014-12-08 (×4): qty 30

## 2014-12-08 NOTE — Progress Notes (Signed)
Physical Therapy Treatment Patient Details Name: Tina [name] MRN: [id number] DOB: [birth year] Today's Date: 12/08/2014    History of Present Illness 81 year old female with past medical history of dementia, diabetes, hypertension, neurogenic claudication, s/p laminectomy by Dr Jeral Fruit in March 2016, subsequently fell and had left subcapital femoral neck fracture, has had ORIF (done by Dr. Margarita Rana) and then sent to SNF. While there, she was found to have leukocytosis and increasing creatinine. Pt was observed coughing with PO intake.     PT Comments    Session focused on PROM and AAROM exercises with pt education to why mobility is important for her function, and to see what tactics work to get pt willing to engage in the session. Pt hesitant to work with PT, needs encouragement. Pt refused to sit EOB, and was requesting to go back to sleep during session. Additional physical therapy is recommended to address functional deficits outlined below.    Concern for ankle PF contractures from being in bed for a prolonged time. Recommend Prevalon Boots or resting foot splints.  Posted Posterior Hip precautions in room for staff and family to be aware.    Follow Up Recommendations  SNF;Supervision/Assistance - 24 hour     Equipment Recommendations  Other (comment)    Recommendations for Other Services      Precautions / Restrictions Precautions Precautions: Fall;Posterior Hip Restrictions Weight Bearing Restrictions: No    Mobility  Bed Mobility    Pt refused any and all bed mobility.               Transfers                    Ambulation/Gait                 Stairs            Wheelchair Mobility    Modified Rankin (Stroke Patients Only)       Balance                                    Cognition Arousal/Alertness: Awake/alert Behavior During Therapy: Anxious (pt wishing to go back to sleep) Overall Cognitive Status:  History of cognitive impairments - at baseline                      Exercises General Exercises - Upper Extremity Shoulder Flexion: AAROM;10 reps;Right;Supine (HOB elevated) Elbow Flexion: 5 reps;Left;AAROM;Supine (HOB elevated) Wrist Flexion: 5 reps;Right;AROM;Supine (HOB elevated ) General Exercises - Lower Extremity Ankle Circles/Pumps: PROM;Both;10 reps;Supine Hip ABduction/ADduction: PROM;Both;10 reps;Supine (HOB elevated), increased resistance on Left Hip Flexion/Marching: PROM;Both;10 reps;Supine (HOB elevated)    General Comments General comments (skin integrity, edema, etc.): Pt scratching and picking at rash on R shoulder.  Edema noted on R UE , repositioned R arm under pillow and reapplied R wrist restraint after session.       Pertinent Vitals/Pain Pain Assessment: Faces Faces Pain Scale: Hurts even more Pain Location: "all over" per pt Pain Descriptors / Indicators: Grimacing;Discomfort;Tiring Pain Intervention(s): Limited activity within patient's tolerance;Monitored during session    Home Living                      Prior Function            PT Goals (current goals can now be found in the care plan section) Acute Rehab  PT Goals PT Goal Formulation: Patient unable to participate in goal setting Time For Goal Achievement: 12/12/14 Potential to Achieve Goals: Fair Progress towards PT goals: Progressing toward goals    Frequency  Min 2X/week    PT Plan Current plan remains appropriate    Co-evaluation             End of Session Equipment Utilized During Treatment: Activity Tolerance: Patient limited by fatigue (Treatment limited by patient's wishes to go back to sleep ) Patient left: in bed;with call bell/phone within reach;with bed alarm set;with restraints reapplied     Time: 1456-1516 PT Time Calculation (min) (ACUTE ONLY): 20 min  Charges:                       G CodesOlene Craven, SPT Acute Rehabilitation  Services Office: [phone]   Olene Craven 12/08/2014, 3:42 PM

## 2014-12-08 NOTE — Progress Notes (Signed)
Pt became very angry and refused medicines and any other daily cares at this time. She uses foul language and states for [place] to leave her alone and let her sleep. Will continue to monitor.

## 2014-12-08 NOTE — Progress Notes (Signed)
Attempted to exchange SL PICC for DL PICC, upon which resistance met and inability to advance PICC to the SVC. Dr Maudie Mercury notified and pt referred to IR.

## 2014-12-08 NOTE — Plan of Care (Signed)
Problem: Phase II Progression Outcomes Goal: Tolerating diet Outcome: Not Progressing Pt refuses to eat. Fluids/ Food offered and encouraged. Pt still declines.   Tina [name]

## 2014-12-08 NOTE — Progress Notes (Signed)
Attempted to give patient bedtime medicine. Patient continues to yell "I won't take them now, I'll take them tomorrow. You all won't leave me alone. I just want to sleep". Attempted to speak with patient to take medications for her to get better. Patient hollers "Get out. I just want to sleep". Will continue to monitor.

## 2014-12-08 NOTE — Progress Notes (Signed)
Subjective: Afebrile overnite, slightly more somnolent today.  confused at times CHF (ef 35-40%) Pt denies cp, palp, sob. ARF: improving Hypoglycemia: stable Hypokalemia: On potassium, Encephalopathy: much improved. Pt is able to converse.  Objective: Vital signs in last 24 hours: Temp:  [97.8 F (36.6 C)-98.9 F (37.2 C)] 98.9 F (37.2 C) (06/13 2102) Pulse Rate:  [81-86] 84 (06/13 2102) Resp:  [17-18] 18 (06/13 2102) BP: (135-154)/(56-60) 154/59 mmHg (06/13 2102) SpO2:  [94 %-100 %] 96 % (06/13 2102) Weight:  [95.618 kg (210 lb 12.8 oz)] 95.618 kg (210 lb 12.8 oz) (06/13 2102) Weight change:  Last BM Date: 12/07/14  Intake/Output from previous day: 06/12 0701 - 06/13 0700 In: 1860 [P.O.:940; TPN:920] Out: 1650 [Urine:1650] Intake/Output this shift:    Heent: anicteric Neck: no jvd Heart: rrr s1, s2 Lung: ctab Abd: soft, nt, nd, +bs Ext: no c/c /  Trace edema.   Lab Results:  Recent Labs  12/06/14 1145  WBC 12.8*  HGB 9.5*  HCT 29.6*  PLT 308   BMET  Recent Labs  12/06/14 0400 12/08/14 0545  NA 142 137  K 3.2* 3.5  CL 108 103  CO2 23 25  GLUCOSE 133* 200*  BUN 30* 25*  CREATININE 2.16* 1.67*  CALCIUM 8.2* 8.1*    Studies/Results: No results found.  Medications: I have reviewed the patient's current medications.  Assessment/Plan: Acute on CRF Secondary to ATN Pt is being followed by nephrology, creatinine is trending down Metabolic acidosis on bicarbonate   Hypokalemia Resolved.  check cmp in am  Leukocytosis ? Due to uti vs aspiration pneumonia Wbc stable  PSVT Started on toprol xl 11/30/2014, rate improved.   Severe protein calorie malnutrition Cont tpn, appreciate pharmacy input SNF will accept TPN   Hypoglycemia Hopefully this will improve with nutrition,   Pafib (CHADSVASC 7) Appreciate cardiology input.   CHF (EF 35-45%) Appreciate cardiology input, using iv lasix, since pt refusing po medications  intermittently  Dm2 Hga1c=5.8  Hyperlipidemia On lower dose than normal of crestor due to renal insufficiency  Aspiration pneumonia Speech therapy evaluated, Appreciate input. On liquid diet and will advance as tolerated  Anemia Repeat cbc in am  DVT prophylaxis: heparin [place]  LOS: 12 days   Pearson Grippe 12/08/2014, 9:28 PM

## 2014-12-08 NOTE — Progress Notes (Signed)
PARENTERAL NUTRITION CONSULT NOTE  Pharmacy Consult:  TPN Indication:  Refusal of PO intake and no enteral access  No Known Allergies  Patient Measurements: Height: 5' 5"  (165.1 cm) Weight: 210 lb (95.255 kg) IBW/kg (Calculated) : 57 Adjusted Body Weight: 69kg  Vital Signs: Temp: 98.5 F (36.9 C) (06/13 0546) BP: 144/56 mmHg (06/13 0546) Pulse Rate: 81 (06/13 0546) Intake/Output from previous day: 06/12 0701 - 06/13 0700 In: 1860 [P.O.:940; TPN:920] Out: 1650 [Urine:1650]  Labs:  Recent Labs  12/06/14 1145  WBC 12.8*  HGB 9.5*  HCT 29.6*  PLT 308     Recent Labs  12/06/14 0400 12/08/14 0545  NA 142 137  K 3.2* 3.5  CL 108 103  CO2 23 25  GLUCOSE 133* 200*  BUN 30* 25*  CREATININE 2.16* 1.67*  CALCIUM 8.2* 8.1*  MG  --  1.4*  PHOS  --  2.9  PROT  --  4.3*  ALBUMIN  --  1.7*  AST  --  22  ALT  --  12*  ALKPHOS  --  90  BILITOT  --  0.5  PREALBUMIN  --  21.1  TRIG  --  167*   Estimated Creatinine Clearance: 30.2 mL/min (by C-G formula based on Cr of 1.67).    Recent Labs  12/07/14 1856 12/08/14 0016 12/08/14 0801  GLUCAP 237* 221* 193*     Insulin Requirements in the past 24 hours:  6 units SSI  Assessment: 83 YOF admitted from SNF for elevated SCr and leukocytosis. Pharmacy was consulted to initiate TPN for reasons of poor oral intake and hypoglycemia.  TPN was deferred given her intact GI tract and ASPEN guidelines recommending TPN only for indications in which patient's GI tract is non-functional.  It has been challenging to meet patient's nutritional needs by other means such as PO intake and enteral nutrition.    GI: prealbumin WNL but trending down given progressive refusal of PO intake/FTT.  Started enteral feeds on 6/9, then pulled out NG tube. Also pulled at PICC line. Passed swallow eval on 6/11.  Patient continues to refuse her PO meds and lab draws. Small amounts of PO intake noted on 6/11 and 6/12.  Meds: PPI, senna-docusate Endo:  DM2 with A1c 5.8%.  Multiple hypoglycemic episodes with D50 given on 6/7. CBGs elevated since TPN started 6/11. Lytes: K+ low normal (69mq PO daily), Mag low at 1.4, others WNL Renal: AKI on admit (?vanc, ATN) - SCr down 1.67, CrCL 30 ml/min Cards: CHF (EF 35-45%), HTN, PVD, HLD - VSS - ASA81, Lasix (increased to 233mIV BID), hydralazine, Isordil, Toprol XL, Crestor Hepatobil: LFTs/alk phos/tbili wnl, TG decreased to 167 Neuro: acute encephalopathy, dementia - confused and agitated ID: s/p 6d Zosyn for UTI vs. asp PNA. Cdiff and UCx negative - afebrile, WBC improving AC: PAF - CHADSVASC 7 - not anticoag candidate per Cards. INR 2.05 Best Practices: heparin SQ TPN Access: long-term PICC pulled and replaced 6/8; pulled out again by patient 6/10, replaced 12/06/14 TPN start date: 12/06/14  Current Nutrition:  Clinimix Prostat daily (none received) TF stopped 12/05/14 after NG tube pulled out.  Nutritional Goals:  1750-1900 kCal, 100-120 grams of protein per day   Plan:  - Continue Clinimix E 5/15 at 40 ml/hr (goal rate ~83 ml/hr) + IVFE at 10 ml/hr.  Plan to increase TPN rate once CBGs are better controlled and risk of refeeding is no longer a concern. - Daily multivitamin and trace elements - KCL x 4  runs in additional to daily KCL - Mag sulfate 2gm IV x 1 - Add 15 units regular insulin to TPN + continue SSI - F/U AM labs, CBGs - At $1635/day with a functioning gut, TPN is not an optimal long-term option. - Consider palliative care consult    Ileana Chalupa D. Mina Marble, PharmD, BCPS Pager:  [phone] 12/08/2014, 9:39 AM

## 2014-12-08 NOTE — Progress Notes (Signed)
Nutrition Follow-up  DOCUMENTATION CODES:  Obesity unspecified  INTERVENTION:  TPN per Pharmacy.  Discontinue TF formula.   Provide Glucerna Shake po TID, each supplement provides 220 kcal and 10 grams of protein.  Provide 30 ml Prostat po BID, each supplement provides 100 kcal and 15 grams of protein.  Encourage adequate PO intake.   Recommend transitioning to full PO's or EN when able as TPN is not an optimal long term option.   RD to continue to monitor.   NUTRITION DIAGNOSIS:  Inadequate oral intake related to  (decreased appetite) as evidenced by meal completion < 25%; ongoing  GOAL:  Patient will meet greater than or equal to 90% of their needs; progressing  MONITOR:  PO intake, Supplement acceptance, Weight trends, Labs, I & O's  REASON FOR ASSESSMENT:  Consult New TPN/TNA  ASSESSMENT: Pt with past medical history of dementia, diabetes, hypertension, neurogenic claudication, s/p laminectomy March 2016, subsequently fell and had left subcapital femoral neck fracture, has had ORIF and then sent to SNF. Pt found to have leukocytosis and increasing creatinine, CHF, acute on chronic CKD stage 4.   NGT pulled 6/10. TF stopped. RD consulted for new TPN. Per Pharmacy, Clinimix E 5/15 running at 40 ml/hr with IVFE at 10 ml/hr with goal at 83 ml/hr with lipids MWF to average 1619 kcal and 100 grams of protein/day. Plans to increase TPN once CBG's are better controlled and risk for refeeding is not a concern. Recommend transitioning to full po's or EN when able as TPN is not an optimal long term option.   Pt is currently on a full liquid diet. Meal completion has been 0-50%. Pt is been refusing po's intermittently. RD to order nutritional supplements to maximize po intake.   Labs: Low calcium, ALT, GFR, and magnesium. High BUN and creatinine.  Phosphorous and potassium WNL.   Height:  Ht Readings from Last 1 Encounters:  11/27/14 5\' 5"  (1.651 m)    Weight:  Wt  Readings from Last 1 Encounters:  12/06/14 210 lb (95.255 kg)  Admit wt (6/1) 223 lbs *Weight trending down*  Ideal Body Weight:  56.8 kg  Wt Readings from Last 10 Encounters:  12/06/14 210 lb (95.255 kg)  10/16/14 224 lb (101.606 kg)  09/07/14 224 lb 1.6 oz (101.651 kg)  09/08/14 224 lb (101.606 kg)  08/18/14 230 lb (104.327 kg)  10/11/13 232 lb (105.235 kg)  07/09/13 237 lb (107.502 kg)  07/05/13 237 lb 6.4 oz (107.684 kg)  08/24/12 240 lb (108.863 kg)  02/17/12 247 lb (112.038 kg)    BMI:  Body mass index is 34.95 kg/(m^2).  Estimated Nutritional Needs:  Kcal:  1750-1900  Protein:  100-120 grams  Fluid:  Per MD  Skin:  Wound (see comment) (Stage I pressure on L heel and buttocks, incision on L hip, +1 RUE, non-pitting LUE, +2 LE edema)  Diet Order:  Diet full liquid Room service appropriate?: Yes; Fluid consistency:: Thin TPN (CLINIMIX-E) Adult TPN (CLINIMIX-E) Adult  EDUCATION NEEDS:  Education needs no appropriate at this time   Intake/Output Summary (Last 24 hours) at 12/08/14 1233 Last data filed at 12/08/14 1029  Gross per 24 hour  Intake   1810 ml  Output   1700 ml  Net    110 ml    Last BM:  6/12  Roslyn Smiling, MS, RD, LDN Pager # [phone] After hours/ weekend pager # [phone]

## 2014-12-08 NOTE — Progress Notes (Signed)
Pt loss PICC access during an attempt to exchange single lumen for double lumen PICC by IV team RN. Dr. Selena Batten paged for instructions. Given order for pt to have PICC placed by IR. IR currently closed. Per Dr. Selena Batten TPN can be held and pt can have PICC placed in the morning. Pt currently eating dinner. Will continue to monitor pt.    [name]

## 2014-12-09 ENCOUNTER — Inpatient Hospital Stay (HOSPITAL_COMMUNITY): Payer: Medicare Other

## 2014-12-09 LAB — MAGNESIUM: MAGNESIUM: 1.9 mg/dL (ref 1.7–2.4)

## 2014-12-09 LAB — BASIC METABOLIC PANEL
Anion gap: 9 (ref 5–15)
BUN: 25 mg/dL — ABNORMAL HIGH (ref 6–20)
CHLORIDE: 103 mmol/L (ref 101–111)
CO2: 25 mmol/L (ref 22–32)
Calcium: 8.4 mg/dL — ABNORMAL LOW (ref 8.9–10.3)
Creatinine, Ser: 1.62 mg/dL — ABNORMAL HIGH (ref 0.44–1.00)
GFR, EST AFRICAN AMERICAN: 33 mL/min — AB (ref >60)
GFR, EST NON AFRICAN AMERICAN: 29 mL/min — AB (ref >60)
Glucose, Bld: 125 mg/dL — ABNORMAL HIGH (ref 65–99)
POTASSIUM: 4.1 mmol/L (ref 3.5–5.1)
SODIUM: 137 mmol/L (ref 135–145)

## 2014-12-09 LAB — GLUCOSE, CAPILLARY
GLUCOSE-CAPILLARY: 134 mg/dL — AB (ref 65–99)
GLUCOSE-CAPILLARY: 137 mg/dL — AB (ref 65–99)
Glucose-Capillary: 143 mg/dL — ABNORMAL HIGH (ref 65–99)
Glucose-Capillary: 145 mg/dL — ABNORMAL HIGH (ref 65–99)

## 2014-12-09 LAB — PHOSPHORUS: PHOSPHORUS: 3.1 mg/dL (ref 2.5–4.6)

## 2014-12-09 MED ORDER — MAGNESIUM SULFATE IN D5W 10-5 MG/ML-% IV SOLN
1.0000 g | Freq: Once | INTRAVENOUS | Status: AC
  Administered 2014-12-09: 1 g via INTRAVENOUS
  Filled 2014-12-09: qty 100

## 2014-12-09 MED ORDER — FAT EMULSION 20 % IV EMUL
240.0000 mL | INTRAVENOUS | Status: AC
  Administered 2014-12-09: 240 mL via INTRAVENOUS
  Filled 2014-12-09: qty 250

## 2014-12-09 MED ORDER — TRACE MINERALS CR-CU-F-FE-I-MN-MO-SE-ZN IV SOLN
INTRAVENOUS | Status: AC
  Administered 2014-12-09: 18:00:00 via INTRAVENOUS
  Filled 2014-12-09: qty 960

## 2014-12-09 MED ORDER — SODIUM CHLORIDE 0.9 % IJ SOLN
10.0000 mL | INTRAMUSCULAR | Status: DC | PRN
  Administered 2014-12-10 (×3): 10 mL
  Filled 2014-12-09 (×3): qty 40

## 2014-12-09 NOTE — Progress Notes (Signed)
PARENTERAL NUTRITION CONSULT NOTE  Pharmacy Consult:  TPN Indication:  Refusal of PO intake and no enteral access  No Known Allergies  Patient Measurements: Height: 5' 5"  (165.1 cm) Weight: 210 lb 12.8 oz (95.618 kg) IBW/kg (Calculated) : 57 Adjusted Body Weight: 69kg  Vital Signs: Temp: 98.7 F (37.1 C) (06/14 0758) Temp Source: Oral (06/14 0758) BP: 119/42 mmHg (06/14 0758) Pulse Rate: 79 (06/14 0758) Intake/Output from previous day: 06/13 0701 - 06/14 0700 In: 220 [P.O.:220] Out: 850 [Urine:850]  Labs:  Recent Labs  12/06/14 1145  WBC 12.8*  HGB 9.5*  HCT 29.6*  PLT 308     Recent Labs  12/08/14 0545 12/09/14 0528  NA 137 137  K 3.5 4.1  CL 103 103  CO2 25 25  GLUCOSE 200* 125*  BUN 25* 25*  CREATININE 1.67* 1.62*  CALCIUM 8.1* 8.4*  MG 1.4* 1.9  PHOS 2.9 3.1  PROT 4.3*  --   ALBUMIN 1.7*  --   AST 22  --   ALT 12*  --   ALKPHOS 90  --   BILITOT 0.5  --   PREALBUMIN 21.1  --   TRIG 167*  --    Estimated Creatinine Clearance: 31.1 mL/min (by C-G formula based on Cr of 1.62).    Recent Labs  12/08/14 1751 12/09/14 0008 12/09/14 0719  GLUCAP 164* 145* 134*     Insulin Requirements in the past 24 hours:  6 units SSI  Assessment: 83 YOF admitted from SNF for elevated SCr and leukocytosis. Pharmacy was consulted to initiate TPN for reasons of poor oral intake and hypoglycemia.  TPN was deferred given her intact GI tract and ASPEN guidelines recommending TPN only for indications in which patient's GI tract is non-functional.  It has been challenging to meet patient's nutritional needs by other means such as PO intake and enteral nutrition.    GI: prealbumin WNL but trending down given progressive refusal of PO intake/FTT.  Started enteral feeds on 6/9, then pulled out NG tube. Also pulled at PICC line. Passed swallow eval on 6/11.  Patient continues to refuse her PO meds and lab draws intermittently. Small amounts of PO intake noted on 6/11 and  6/12 but none on 6/13. No TPN charted yesterday as access lost. Meds: PPI, senna-docusate  Endo: DM2 with A1c 5.8%.  Multiple hypoglycemic episodes with D50 given on 6/7. CBGs elevated since TPN started 6/11. Now 125-247 (down since TPN off with loss of access)  Lytes: K wnl (58mq PO daily and 2 k runs yesterday), Mg 1.9, others WNL  Renal: AKI on admit (?vanc, ATN) - SCr down 1.62, CrCL 31 ml/min, UOP 0.4 ml/kg/h. On bicarb, refused lasix yesterday  Cards: CHF (EF 35-45%), HTN, PVD, HLD - VSS - ASA81, Lasix (increased to 249mIV BID), hydralazine, Isordil, Toprol XL, Crestor  Hepatobil: LFTs/alk phos/tbili wnl, TG decreased to 167  Neuro: acute encephalopathy, dementia - confused and agitated  ID: s/p 6d Zosyn for UTI vs. asp PNA. Cdiff and UCx negative - afebrile, WBC improving  AC: PAF - CHADSVASC 7 - not anticoag candidate per Cards. INR 2.05  Best Practices: heparin SQ TPN Access: long-term PICC pulled and replaced 6/8; pulled out again by patient 6/10, replaced 12/06/14; PICC lost 6/13 - referred to IR TPN start date: 12/06/14  Current Nutrition:  Clinimix Glucerna Shake po TID - one dose charted yesterday Prostat daily (none received) TF stopped 12/05/14 after NG tube pulled out Full liquid - no intake  yesterday  Nutritional Goals:  1750-1900 kCal, 100-120 grams of protein per day   Plan:  - Continue Clinimix E 5/15 at 40 ml/hr (goal rate ~83 ml/hr) + IVFE at 10 ml/hr.  Plan to increase TPN rate once CBGs are better controlled and risk of refeeding is no longer a concern. Access will also need to be re-established. - Daily multivitamin and trace elements - Mag sulfate 1gm IV x 1 to ensure lytes at goal with refeeding concern - Continue 15 units regular insulin in TPN (when restarted) + continue SSI - F/U AM labs, CBGs - At $1635/day with a functioning gut, TPN is not an optimal long-term option. Recommend transitioning to full po's or EN when able - Consider palliative  care consult   Elicia Lamp, PharmD Clinical Pharmacist - Resident Pager [phone] 12/09/2014 9:11 AM

## 2014-12-09 NOTE — Progress Notes (Signed)
Subjective: Afebrile overnite, slightly more awake today.  confused at times CHF (ef 35-40%) Pt denies cp, palp, sob. ARF: improving Hypoglycemia: stable Hypokalemia: On potassium, Encephalopathy: much improved. Pt is able to converse.   Objective: Vital signs in last 24 hours: Temp:  [97.9 F (36.6 C)-98.7 F (37.1 C)] 98.3 F (36.8 C) (06/14 2148) Pulse Rate:  [79-95] 95 (06/14 2148) Resp:  [17-18] 18 (06/14 2148) BP: (96-130)/(42-85) 96/45 mmHg (06/14 2148) SpO2:  [92 %-93 %] 92 % (06/14 2148) Weight change:  Last BM Date: 12/09/14  Intake/Output from previous day: 06/13 0701 - 06/14 0700 In: 220 [P.O.:220] Out: 850 [Urine:850] Intake/Output this shift: Total I/O In: 60 [P.O.:60] Out: -   Heent: anicteric Neck: no jvd Heart: rrr s1, s2,  Lung: ctab Abd: soft, nt, nd, +bs Ext: no c/c/e  Lab Results: No results for input(s): WBC, HGB, HCT, PLT in the last 72 hours. BMET  Recent Labs  12/08/14 0545 12/09/14 0528  NA 137 137  K 3.5 4.1  CL 103 103  CO2 25 25  GLUCOSE 200* 125*  BUN 25* 25*  CREATININE 1.67* 1.62*  CALCIUM 8.1* 8.4*    Studies/Results: No results found.  Medications: I have reviewed the patient's current medications.  Assessment/Plan: Acute on CRF Secondary to ATN Pt is being followed by nephrology, creatinine is trending down Metabolic acidosis on bicarbonate   Hypokalemia Resolved. check cmp in am  Leukocytosis ? Due to uti vs aspiration pneumonia Wbc stable  PSVT Started on toprol xl 11/30/2014, rate improved.   Severe protein calorie malnutrition Cont tpn, appreciate pharmacy input SNF will accept TPN, awaiting Picc line placement   Hypoglycemia Hopefully this will improve with nutrition,   Pafib (CHADSVASC 7) Appreciate cardiology input.   CHF (EF 35-45%) Appreciate cardiology input, using iv lasix, since pt refusing po medications intermittently  Dm2 Hga1c=5.8  Hyperlipidemia On lower dose than  normal of crestor due to renal insufficiency  Aspiration pneumonia Speech therapy evaluated, Appreciate input. On liquid diet and will advance as tolerated  Anemia Repeat cbc in am  DVT prophylaxis: heparin [place]  DIspo: SNF tomorrow   LOS: 13 days   Pearson Grippe 12/09/2014, 10:26 PM

## 2014-12-09 NOTE — Care Management Note (Signed)
Case Management Note  Patient Details  Name: Tina [name] MRN: [id number] Date of Birth: [birth year]  Subjective/Objective:         CM following for progression and d/c planning.           Action/Plan: 12/09/14 Met with pt and discussed d/c plans, this pt is alert at this time and discussed her return to SNF with this CM.    Expected Discharge Date:  12/10/2014           Expected Discharge Plan:  [place]  In-House Referral:  Clinical Social Work  Discharge planning Services  CM Consult, [place]  Post Acute Care Choice:  NA Choice offered to:  NA  DME Arranged:  N/A DME Agency:  NA  HH Arranged:  NA HH Agency:     Status of Service:  In process, will continue to follow  Medicare Important Message Given:  Yes Date Medicare IM Given:  12/04/14 Medicare IM give by:  Jasmine Pang RN MPH, case manager, [phone] Date Additional Medicare IM Given:  12/09/14 Additional Medicare Important Message give by:  Jasmine Pang RN MPH case manager [phone]  If discussed at [place] of Stay Meetings, dates discussed:    Additional Comments:  Adron Bene, RN 12/09/2014, 11:30 AM

## 2014-12-09 NOTE — Progress Notes (Addendum)
Patient refused last night and this morning medications. Refusing blood sugar check at this time. Notified Dr. Selena Batten. No new orders at this time.

## 2014-12-09 NOTE — Procedures (Signed)
RUE PICC SVC RA 40 cm No comp/EBL

## 2014-12-10 LAB — BASIC METABOLIC PANEL
Anion gap: 8 (ref 5–15)
BUN: 26 mg/dL — AB (ref 6–20)
CALCIUM: 8.3 mg/dL — AB (ref 8.9–10.3)
CO2: 26 mmol/L (ref 22–32)
Chloride: 102 mmol/L (ref 101–111)
Creatinine, Ser: 1.65 mg/dL — ABNORMAL HIGH (ref 0.44–1.00)
GFR calc Af Amer: 33 mL/min — ABNORMAL LOW (ref >60)
GFR, EST NON AFRICAN AMERICAN: 28 mL/min — AB (ref >60)
GLUCOSE: 155 mg/dL — AB (ref 65–99)
Potassium: 3.8 mmol/L (ref 3.5–5.1)
SODIUM: 136 mmol/L (ref 135–145)

## 2014-12-10 LAB — GLUCOSE, CAPILLARY
GLUCOSE-CAPILLARY: 164 mg/dL — AB (ref 65–99)
GLUCOSE-CAPILLARY: 194 mg/dL — AB (ref 65–99)
Glucose-Capillary: 165 mg/dL — ABNORMAL HIGH (ref 65–99)

## 2014-12-10 LAB — MAGNESIUM: Magnesium: 2.2 mg/dL (ref 1.7–2.4)

## 2014-12-10 LAB — PHOSPHORUS: Phosphorus: 3.7 mg/dL (ref 2.5–4.6)

## 2014-12-10 MED ORDER — HYDRALAZINE HCL 10 MG PO TABS: 10.0000 mg | ORAL_TABLET | Freq: Three times a day (TID) | ORAL | Status: DC

## 2014-12-10 MED ORDER — FAT EMULSION 20 % IV EMUL
240.0000 mL | INTRAVENOUS | Status: DC
  Filled 2014-12-10: qty 250

## 2014-12-10 MED ORDER — FUROSEMIDE 40 MG PO TABS: 40.0000 mg | ORAL_TABLET | Freq: Two times a day (BID) | ORAL | Status: DC

## 2014-12-10 MED ORDER — FUROSEMIDE 10 MG/ML IJ SOLN
40.0000 mg | Freq: Once | INTRAMUSCULAR | Status: AC
  Administered 2014-12-10: 40 mg via INTRAVENOUS
  Filled 2014-12-10: qty 4

## 2014-12-10 MED ORDER — SODIUM BICARBONATE 650 MG PO TABS: 650.0000 mg | ORAL_TABLET | Freq: Three times a day (TID) | ORAL | Status: AC

## 2014-12-10 MED ORDER — POTASSIUM CHLORIDE CRYS ER 20 MEQ PO TBCR: 20.0000 meq | EXTENDED_RELEASE_TABLET | Freq: Every day | ORAL | Status: DC

## 2014-12-10 MED ORDER — ALBUTEROL SULFATE (2.5 MG/3ML) 0.083% IN NEBU: 2.5000 mg | INHALATION_SOLUTION | RESPIRATORY_TRACT | Status: AC | PRN

## 2014-12-10 MED ORDER — ALBUTEROL SULFATE (2.5 MG/3ML) 0.083% IN NEBU: 2.5000 mg | INHALATION_SOLUTION | RESPIRATORY_TRACT | Status: DC | PRN

## 2014-12-10 MED ORDER — FUROSEMIDE 10 MG/ML IJ SOLN: 40.0000 mg | Freq: Once | INTRAMUSCULAR | Status: DC

## 2014-12-10 MED ORDER — METOPROLOL SUCCINATE ER 25 MG PO TB24: 12.5000 mg | ORAL_TABLET | Freq: Every day | ORAL | Status: DC

## 2014-12-10 MED ORDER — GLUCERNA SHAKE PO LIQD: 237.0000 mL | Freq: Three times a day (TID) | ORAL | Status: AC

## 2014-12-10 MED ORDER — HEPARIN SOD (PORK) LOCK FLUSH 100 UNIT/ML IV SOLN
250.0000 [IU] | INTRAVENOUS | Status: AC | PRN
  Administered 2014-12-10 (×2): 250 [IU]

## 2014-12-10 MED ORDER — M.V.I. ADULT IV INJ
INTRAVENOUS | Status: DC
  Filled 2014-12-10: qty 1992

## 2014-12-10 MED ORDER — FUROSEMIDE 40 MG PO TABS
40.0000 mg | ORAL_TABLET | Freq: Two times a day (BID) | ORAL | Status: DC
  Filled 2014-12-10 (×2): qty 1

## 2014-12-10 MED ORDER — CAMPHOR-MENTHOL 0.5-0.5 % EX LOTN: TOPICAL_LOTION | CUTANEOUS | Status: AC | PRN

## 2014-12-10 NOTE — Discharge Instructions (Signed)
Please check daily weight. TPN Clinimix-E at 40 mL per hour, Fat emulsion 20% ( ) at 10 mL per hour, check cbc cmp, tsh, bnp, ammonia  in 3 days

## 2014-12-10 NOTE — Progress Notes (Addendum)
PARENTERAL NUTRITION CONSULT NOTE  Pharmacy Consult:  TPN Indication:  Refusal of PO intake and no enteral access  No Known Allergies  Patient Measurements: Height: 5' 5"  (165.1 cm) Weight: 210 lb 12.8 oz (95.618 kg) IBW/kg (Calculated) : 57 Adjusted Body Weight: 69kg  Vital Signs: Temp: 99.1 F (37.3 C) (06/15 0439) Temp Source: Oral (06/15 0439) BP: 110/35 mmHg (06/15 0439) Pulse Rate: 84 (06/15 0439) Intake/Output from previous day: 06/14 0701 - 06/15 0700 In: 180 [P.O.:180] Out: 700 [Urine:700]  Labs: No results for input(s): WBC, HGB, HCT, PLT, APTT, INR in the last 72 hours.   Recent Labs  12/08/14 0545 12/09/14 0528 12/10/14 0404  NA 137 137 136  K 3.5 4.1 3.8  CL 103 103 102  CO2 25 25 26   GLUCOSE 200* 125* 155*  BUN 25* 25* 26*  CREATININE 1.67* 1.62* 1.65*  CALCIUM 8.1* 8.4* 8.3*  MG 1.4* 1.9 2.2  PHOS 2.9 3.1 3.7  PROT 4.3*  --   --   ALBUMIN 1.7*  --   --   AST 22  --   --   ALT 12*  --   --   ALKPHOS 90  --   --   BILITOT 0.5  --   --   PREALBUMIN 21.1  --   --   TRIG 167*  --   --    Estimated Creatinine Clearance: 30.6 mL/min (by C-G formula based on Cr of 1.65).    Recent Labs  12/09/14 1721 12/10/14 0030 12/10/14 0609  GLUCAP 143* 164* 165*     Insulin Requirements in the past 24 hours:  6 units SSI  Assessment: 83 YOF admitted from SNF for elevated SCr and leukocytosis. Pharmacy was consulted to initiate TPN for reasons of poor oral intake and hypoglycemia.  TPN was deferred given her intact GI tract and ASPEN guidelines recommending TPN only for indications in which patient's GI tract is non-functional.  It has been challenging to meet patient's nutritional needs by other means such as PO intake and enteral nutrition.    GI: prealbumin WNL but trending down given progressive refusal of PO intake/FTT.  Started enteral feeds on 6/9, then pulled out NG tube. Also pulled at PICC line. Passed swallow eval on 6/11.  Patient continues  to refuse her PO meds and lab draws intermittently. Small amounts of PO intake noted on 6/11 and 6/12 but none on 6/13. No TPN charted 6/13 as access lost. Meds: PPI, senna-docusate  Endo: DM2 with A1c 5.8%.  Multiple hypoglycemic episodes with D50 given on 6/7. CBGs elevated since TPN started 6/11. Now 137-165 (increased since TPN hung after PICC replaced)  Lytes: K wnl (34mq PO daily), Mg 2.2 s/p Mg 1g on 6/14, others WNL  Renal: AKI on admit (?vanc, ATN) - SCr stable 1.65, CrCL 30 ml/min, UOP 0.3 ml/kg/h. On bicarb, refused 1 lasix dose yesterday  Cards: CHF (EF 35-45%), HTN, PVD, HLD - BP soft, HR ok - ASA81, Lasix (increased to 238mIV BID), hydralazine, Isordil, Toprol XL, Crestor  Hepatobil: LFTs/alk phos/tbili wnl, TG decreased to 167  Neuro: acute encephalopathy, dementia - confused and agitated  ID: s/p 6d Zosyn for UTI vs. asp PNA. Cdiff and UCx negative - afebrile, WBC improving  AC: PAF - CHADSVASC 7 - not anticoag candidate per Cards. INR 2.05  Best Practices: heparin SQ TPN Access: long-term PICC pulled and replaced 6/8; pulled out again by patient 6/10, replaced 12/06/14; PICC lost 6/13 - replaced 6/14 TPN  start date: 12/06/14  Current Nutrition:  Clinimix Glucerna Shake po TID - one dose charted yesterday Prostat daily (none received) TF stopped 12/05/14 after NG tube pulled out Full liquid - 25% of 1 meal charted yesterday  Nutritional Goals:  1750-1900 kCal, 100-120 grams of protein per day   Plan:  - Increase Clinimix E 5/15 to goal rate 83 ml/hr + IVFE at 10 ml/hr.  - Daily multivitamin and trace elements - Continue 30 units regular insulin in TPN + SSI - F/U AM labs, CBGs - TPN is not an optimal long-term option. Recommend transitioning to full po's or EN when able - Consider palliative care consult   Elicia Lamp, PharmD Clinical Pharmacist - Resident Pager [phone] 12/10/2014 8:37 AM   ==================  Addendum: - patient to be discharged on  TPN per MD, plan as above.  TPN will provide 1894 kCal and 100gm protein daily.   Anette Barra D. Mina Marble, PharmD, BCPS Pager:  [phone] 12/10/2014, 2:11 PM

## 2014-12-10 NOTE — Discharge Summary (Signed)
Physician Discharge Summary  Patient ID: Tina [name] MRN: [id number] DOB/AGE: 79/29/1932 79 y.o.  Admit date: 11/26/2014 Discharge date: 12/10/2014  Admission Diagnoses: ARF UTI Dm2 Hypertension Encephalopathy  Discharge Diagnoses:  Principal Problem:   AKI (acute kidney injury) Active Problems:   FTT (failure to thrive) in adult   HTN (hypertension), benign   Leukocytosis   Encephalopathy   DM type 2 (diabetes mellitus, type 2)   Essential hypertension   UTI (lower urinary tract infection)   Pressure ulcer   Acute on chronic combined systolic and diastolic HF (heart failure)   Confusion   Acute systolic heart failure   Discharged Condition: stable  Hospital Course:  81 y.o. female with hx of neurogenic claudication, s/p laminectomy by Dr Jeral Fruit in March 2016, subsequently fell and had left subcapital femoral neck Fx, had ORIF by Dr Eulah Pont, sent to SNF, and sent in the ER as she was found to have leukocytosis. She also has hx of dementia, DM2, HTN, and Macrocytic anemia. Her leukocytosis has been there since her last admission, and it was felt at that time to be stress demargination. Evaluation in the ER showed Cr of 2.1, with K of 3.3, an increase from her prior baseline of 1.6. She was also found to have a UTI with UA having TNTC WBCs. The EDP thought she was volume depleted, and her AKI was due to prerenal azotemia, and IVF was given to her. After only one liter of IVF, she was clinically in CHF, with bilateral rales, and it was a change. IVF was stopped, and Lasix  IV given, and she was better. Hospitalist was asked to admit her for leukocytosis, UTI, and AKI on CKD> Pt was admitted to [facility] and then transferred to [facility].   Cardiology and Nephrology were consulted,  Pt was tx with iv lasix, Creatinine worsened to peak of 3.19, thought secondary to ATN,  Pt transferred from [facility] to my service after 6 days.  Pt was noncompliant with treatment and feeding and  pulled out her picc line x2 and also pulled out NGT.  Due to poor po intake and severe protein calorie malnutrition pt was started on TPN.  IR didn't want to place peg due to fact that pt might pull it out.  Pt appears to be tolerating TPN and creatinine has improved.  Pt will be discharged back to SNF today.   Consults: cardiology and nephrology  Significant Diagnostic Studies: labs: echo EF 35-40%  Treatments: pt was treated with iv abx   Discharge Exam: Blood pressure 110/35, pulse 84, temperature 99.1 F (37.3 C), temperature source Oral, resp. rate 16, height  (1.651 m), weight 95.618 kg (210 lb 12.8 oz), SpO2 92 %. Heent: anicteric Neck: no jvd Heart: rrr s1, s2 Lung: ctab Abd: soft, morbidly obese, nt, nd, +bs Ext: no c/c/  1+ edema.   Acute on CRF Secondary to ATN Metabolic acidosis on bicarbonate Check cmp in 2 days   Hypokalemia Resolved. check cmp in 2 days  Leukocytosis ? Due to uti vs aspiration pneumonia, resolved Wbc stable Check cbc in 2 days  PSVT Started on toprol xl 11/30/2014, rate improved.   Severe protein calorie malnutrition Cont tpn, appreciate pharmacy input, see instruction below SNF will accept TPN   Hypoglycemia Hopefully this will improve with nutrition,   Pafib (CHADSVASC 7) Appreciate cardiology input.   CHF (EF 35-45%) Appreciate cardiology input, back to po lasix, adjust dose according to weight, please weight daily at [facility]  Dm2 Hga1c=5.8  Hyperlipidemia On lower dose than normal of crestor due to renal insufficiency  Aspiration pneumonia Speech therapy evaluated, Appreciate input. On liquid diet and will advance as tolerated Have speech evaluate at SNF  Anemia Repeat cbc in 2 days   Disposition: 03-Skilled Nursing Facility     Medication List    STOP taking these medications        colesevelam 625 MG tablet  Commonly known as:  WELCHOL     HYDROcodone-acetaminophen 5-325 MG per tablet  Commonly known  as:  NORCO     phenylephrine 0.5 % nasal solution  Commonly known as:  NEO-SYNEPHRINE     rosuvastatin 20 MG tablet  Commonly known as:  CRESTOR     traMADol 50 MG tablet  Commonly known as:  ULTRAM      TAKE these medications        acetaminophen 650 MG CR tablet  Commonly known as:  TYLENOL  Take 650 mg by mouth every 6 (six) hours as needed for pain.     albuterol (2.5 MG/3ML) 0.083% nebulizer solution  Commonly known as:  PROVENTIL  Take 3 mLs (2.5 mg total) by nebulization every 4 (four) hours as needed for wheezing.     aspirin EC 81 MG tablet  Take 1 tablet (81 mg total) by mouth daily. Start after 30 days of aspirin 325mg      camphor-menthol lotion  Commonly known as:  SARNA  Apply topically as needed for itching.     cholecalciferol 1000 UNITS tablet  Commonly known as:  VITAMIN D  Take 1,000 Units by mouth daily.     ezetimibe 10 MG tablet  Commonly known as:  ZETIA  Take 10 mg by mouth daily at 6 PM.     feeding supplement (GLUCERNA SHAKE) Liqd  Take 237 mLs by mouth 3 (three) times daily between meals.     feeding supplement (PRO-STAT SUGAR FREE 64) Liqd  Take 30 mLs by mouth 3 (three) times daily with meals.     furosemide 10 MG/ML injection  Commonly known as:  LASIX  Inject 4 mLs (40 mg total) into the vein once.     furosemide 40 MG tablet  Commonly known as:  LASIX  Take 1 tablet (40 mg total) by mouth 2 (two) times daily.     guaiFENesin 600 MG 12 hr tablet  Commonly known as:  MUCINEX  Take 600 mg by mouth 2 (two) times daily. 5 day course started 09/23/14     hydrALAZINE 10 MG tablet  Commonly known as:  APRESOLINE  Take 1 tablet (10 mg total) by mouth every 8 (eight) hours.     insulin aspart 100 UNIT/ML injection  Commonly known as:  novoLOG  Inject 1-9 Units into the skin 3 (three) times daily before meals. Per sliding scale: CBG 121-150 1 unit, 151-200 2 units; 201-250 3 units; 251-300 5 units, 301-351 7 units, 351-400 9 units;>401  call MD     LORazepam 0.5 MG tablet  Commonly known as:  ATIVAN  Take 1 tablet (0.5 mg total) by mouth every 6 (six) hours as needed for anxiety.     Methylfol-Methylcob-Acetylcyst 6-2-600 MG Tabs  Take 1 tablet by mouth daily.     metoprolol succinate 25 MG 24 hr tablet  Commonly known as:  TOPROL-XL  Take 0.5 tablets (12.5 mg total) by mouth daily.     ondansetron 4 MG tablet  Commonly known as:  ZOFRAN  Take 1 tablet (  4 mg total) by mouth every 8 (eight) hours as needed for nausea or vomiting.     pantoprazole 40 MG tablet  Commonly known as:  PROTONIX  Take 40 mg by mouth daily.     polyvinyl alcohol 1.4 % ophthalmic solution  Commonly known as:  LIQUIFILM TEARS  Place 1 drop into both eyes 3 (three) times daily as needed for dry eyes.     potassium chloride SA 20 MEQ tablet  Commonly known as:  K-DUR,KLOR-CON  Take 1 tablet (20 mEq total) by mouth daily.     senna-docusate 8.6-50 MG per tablet  Commonly known as:  Senokot-S  Take 1 tablet by mouth 2 (two) times daily. Hold for loose stool     sodium bicarbonate 650 MG tablet  Take 1 tablet (650 mg total) by mouth 3 (three) times daily.           Follow-up Information    Follow up with HAWKINS,EDWARD L, MD.   Specialty:  Pulmonary Disease   Contact information:   [address] [place] [address] [phone]       Follow up with Pearson Grippe, MD.   Specialty:  Internal Medicine   Contact information:   [address] [place] [address] [phone]       Follow up with SNF physician or NP In 2 days.      SignedPearson Grippe 12/10/2014, 9:21 AM

## 2014-12-10 NOTE — Clinical Social Work Note (Signed)
Patient medically stable for discharge back to [facility] today. Facility contacted and discharge summary transmitted. CSW attempted to reach daughter Jaci Standard ([phone]) and message left for her to call CSW. Patient will be transported by ambulance back to facility.   Genelle Bal, MSW, LCSW Licensed Clinical Social Worker Clinical Social Work Department [employer] [phone]

## 2014-12-12 ENCOUNTER — Ambulatory Visit (HOSPITAL_COMMUNITY)
Admission: RE | Admit: 2014-12-12 | Discharge: 2014-12-12 | Disposition: A | Discharge status: Home / Self Care | Payer: Medicare Other | Source: Ambulatory Visit | Attending: Internal Medicine | Admitting: Internal Medicine

## 2014-12-12 ENCOUNTER — Other Ambulatory Visit (HOSPITAL_COMMUNITY): Payer: Self-pay | Admitting: Internal Medicine

## 2014-12-12 ENCOUNTER — Inpatient Hospital Stay (HOSPITAL_COMMUNITY): Admission: RE | Admit: 2014-12-12 | Payer: Self-pay | Source: Ambulatory Visit

## 2014-12-12 ENCOUNTER — Telehealth: Payer: Self-pay | Admitting: Gastroenterology

## 2014-12-12 DIAGNOSIS — R627 Adult failure to thrive: Secondary | ICD-10-CM

## 2014-12-12 DIAGNOSIS — Z452 Encounter for adjustment and management of vascular access device: Secondary | ICD-10-CM | POA: Insufficient documentation

## 2014-12-12 MED ORDER — LIDOCAINE HCL 1 % IJ SOLN
INTRAMUSCULAR | Status: AC
  Filled 2014-12-12: qty 20

## 2014-12-12 MED ORDER — HEPARIN SOD (PORK) LOCK FLUSH 100 UNIT/ML IV SOLN
500.0000 [IU] | Freq: Once | INTRAVENOUS | Status: AC
  Administered 2014-12-12: 500 [IU]

## 2014-12-12 MED ORDER — HEPARIN SOD (PORK) LOCK FLUSH 100 UNIT/ML IV SOLN
INTRAVENOUS | Status: AC
  Administered 2014-12-12: 500 [IU]
  Filled 2014-12-12: qty 5

## 2014-12-12 NOTE — Procedures (Signed)
Interventional Radiology Procedure Note  Procedure: Placement of a right IJ approach tunneled catheter.  Double lumen.  Power injectable.  Ligated at 17cm.  Tip is positioned at the superior cavoatrial junction and catheter is ready for immediate use.  Complications: No immediate Recommendations:  - Ok to shower tomorrow - Do not submerge  - Routine care   Signed,  Yvone Neu. Loreta Ave, DO

## 2014-12-12 NOTE — Telephone Encounter (Signed)
SPOKE TO DENISE. PT AGITATED AND RADIOLOGY AFRAID SHE WOULD PULLOUT A PEG. PICC LINE PLACED INSTEAD JUN 14. AGREE IF PT AGITATED SHE IS AT RISK FOR PULLING OUT A PEG. BEING HIGH RISK OF PULLING OUT PEG PREVENTS PT FROM BEING A CANDIDATE FOR PEG. NOW HAS PICC LINE FOR TPN. DENISE MAY CALL ME IN 7 DAYS IF PT'S MENTAL STATUS IMPROVES.

## 2014-12-29 ENCOUNTER — Encounter (HOSPITAL_COMMUNITY): Payer: Self-pay | Admitting: Emergency Medicine

## 2014-12-29 ENCOUNTER — Emergency Department (HOSPITAL_COMMUNITY): Payer: Medicare Other

## 2014-12-29 ENCOUNTER — Inpatient Hospital Stay (HOSPITAL_COMMUNITY)
Admission: EM | Admit: 2014-12-29 | Discharge: 2015-01-07 | DRG: 871 | Disposition: A | Discharge status: Skilled Nursing Facility | Payer: Medicare Other | Attending: Internal Medicine | Admitting: Internal Medicine

## 2014-12-29 DIAGNOSIS — E860 Dehydration: Secondary | ICD-10-CM | POA: Diagnosis present

## 2014-12-29 DIAGNOSIS — D649 Anemia, unspecified: Secondary | ICD-10-CM | POA: Diagnosis present

## 2014-12-29 DIAGNOSIS — F0390 Unspecified dementia without behavioral disturbance: Secondary | ICD-10-CM | POA: Diagnosis present

## 2014-12-29 DIAGNOSIS — I5022 Chronic systolic (congestive) heart failure: Secondary | ICD-10-CM | POA: Diagnosis present

## 2014-12-29 DIAGNOSIS — E872 Acidosis, unspecified: Secondary | ICD-10-CM | POA: Diagnosis present

## 2014-12-29 DIAGNOSIS — R627 Adult failure to thrive: Secondary | ICD-10-CM | POA: Diagnosis present

## 2014-12-29 DIAGNOSIS — E1151 Type 2 diabetes mellitus with diabetic peripheral angiopathy without gangrene: Secondary | ICD-10-CM | POA: Diagnosis present

## 2014-12-29 DIAGNOSIS — Z8249 Family history of ischemic heart disease and other diseases of the circulatory system: Secondary | ICD-10-CM

## 2014-12-29 DIAGNOSIS — N183 Chronic kidney disease, stage 3 unspecified: Secondary | ICD-10-CM | POA: Diagnosis present

## 2014-12-29 DIAGNOSIS — N179 Acute kidney failure, unspecified: Secondary | ICD-10-CM | POA: Diagnosis present

## 2014-12-29 DIAGNOSIS — A419 Sepsis, unspecified organism: Principal | ICD-10-CM | POA: Diagnosis present

## 2014-12-29 DIAGNOSIS — E119 Type 2 diabetes mellitus without complications: Secondary | ICD-10-CM

## 2014-12-29 DIAGNOSIS — E43 Unspecified severe protein-calorie malnutrition: Secondary | ICD-10-CM | POA: Diagnosis present

## 2014-12-29 DIAGNOSIS — Z6834 Body mass index (BMI) 34.0-34.9, adult: Secondary | ICD-10-CM | POA: Diagnosis not present

## 2014-12-29 DIAGNOSIS — Z515 Encounter for palliative care: Secondary | ICD-10-CM

## 2014-12-29 DIAGNOSIS — D72829 Elevated white blood cell count, unspecified: Secondary | ICD-10-CM | POA: Diagnosis not present

## 2014-12-29 DIAGNOSIS — I129 Hypertensive chronic kidney disease with stage 1 through stage 4 chronic kidney disease, or unspecified chronic kidney disease: Secondary | ICD-10-CM | POA: Diagnosis present

## 2014-12-29 DIAGNOSIS — E1129 Type 2 diabetes mellitus with other diabetic kidney complication: Secondary | ICD-10-CM | POA: Diagnosis not present

## 2014-12-29 DIAGNOSIS — N390 Urinary tract infection, site not specified: Secondary | ICD-10-CM | POA: Diagnosis present

## 2014-12-29 DIAGNOSIS — J189 Pneumonia, unspecified organism: Secondary | ICD-10-CM | POA: Diagnosis present

## 2014-12-29 DIAGNOSIS — L8990 Pressure ulcer of unspecified site, unspecified stage: Secondary | ICD-10-CM | POA: Diagnosis present

## 2014-12-29 DIAGNOSIS — Y95 Nosocomial condition: Secondary | ICD-10-CM | POA: Diagnosis present

## 2014-12-29 DIAGNOSIS — Z794 Long term (current) use of insulin: Secondary | ICD-10-CM

## 2014-12-29 DIAGNOSIS — Z87891 Personal history of nicotine dependence: Secondary | ICD-10-CM | POA: Diagnosis not present

## 2014-12-29 DIAGNOSIS — F329 Major depressive disorder, single episode, unspecified: Secondary | ICD-10-CM | POA: Diagnosis present

## 2014-12-29 DIAGNOSIS — N289 Disorder of kidney and ureter, unspecified: Secondary | ICD-10-CM

## 2014-12-29 DIAGNOSIS — K219 Gastro-esophageal reflux disease without esophagitis: Secondary | ICD-10-CM | POA: Diagnosis present

## 2014-12-29 DIAGNOSIS — I959 Hypotension, unspecified: Secondary | ICD-10-CM | POA: Diagnosis present

## 2014-12-29 HISTORY — DX: Heart failure, unspecified: I50.9

## 2014-12-29 LAB — COMPREHENSIVE METABOLIC PANEL
ALT: 21 U/L (ref 14–54)
AST: 45 U/L — ABNORMAL HIGH (ref 15–41)
Albumin: 2.3 g/dL — ABNORMAL LOW (ref 3.5–5.0)
Alkaline Phosphatase: 113 U/L (ref 38–126)
Anion gap: 19 — ABNORMAL HIGH (ref 5–15)
BUN: 78 mg/dL — AB (ref 6–20)
CHLORIDE: 104 mmol/L (ref 101–111)
CO2: 8 mmol/L — ABNORMAL LOW (ref 22–32)
Calcium: 9.3 mg/dL (ref 8.9–10.3)
Creatinine, Ser: 2.57 mg/dL — ABNORMAL HIGH (ref 0.44–1.00)
GFR calc Af Amer: 19 mL/min — ABNORMAL LOW (ref >60)
GFR calc non Af Amer: 16 mL/min — ABNORMAL LOW (ref >60)
Glucose, Bld: 345 mg/dL — ABNORMAL HIGH (ref 65–99)
Potassium: 4.7 mmol/L (ref 3.5–5.1)
Sodium: 131 mmol/L — ABNORMAL LOW (ref 135–145)
Total Bilirubin: 0.5 mg/dL (ref 0.3–1.2)
Total Protein: 6.4 g/dL — ABNORMAL LOW (ref 6.5–8.1)

## 2014-12-29 LAB — CBC WITH DIFFERENTIAL/PLATELET
Basophils Absolute: 0 10*3/uL (ref 0.0–0.1)
Basophils Relative: 0 % (ref 0–1)
EOS ABS: 0.4 10*3/uL (ref 0.0–0.7)
EOS PCT: 1 % (ref 0–5)
HCT: 36.3 % (ref 36.0–46.0)
Hemoglobin: 11.7 g/dL — ABNORMAL LOW (ref 12.0–15.0)
LYMPHS ABS: 1.4 10*3/uL (ref 0.7–4.0)
LYMPHS PCT: 4 % — AB (ref 12–46)
MCH: 30.4 pg (ref 26.0–34.0)
MCHC: 32.2 g/dL (ref 30.0–36.0)
MCV: 94.3 fL (ref 78.0–100.0)
MONOS PCT: 3 % (ref 3–12)
Monocytes Absolute: 1.1 10*3/uL — ABNORMAL HIGH (ref 0.1–1.0)
Neutro Abs: 32.5 10*3/uL — ABNORMAL HIGH (ref 1.7–7.7)
Neutrophils Relative %: 92 % — ABNORMAL HIGH (ref 43–77)
Platelets: 368 10*3/uL (ref 150–400)
RBC: 3.85 MIL/uL — ABNORMAL LOW (ref 3.87–5.11)
RDW: 18.4 % — ABNORMAL HIGH (ref 11.5–15.5)
WBC: 35.4 10*3/uL — AB (ref 4.0–10.5)

## 2014-12-29 LAB — URINALYSIS, ROUTINE W REFLEX MICROSCOPIC
Bilirubin Urine: NEGATIVE
Glucose, UA: NEGATIVE mg/dL
Ketones, ur: NEGATIVE mg/dL
NITRITE: NEGATIVE
PH: 5.5 (ref 5.0–8.0)
Protein, ur: 100 mg/dL — AB
Specific Gravity, Urine: 1.025 (ref 1.005–1.030)
Urobilinogen, UA: 0.2 mg/dL (ref 0.0–1.0)

## 2014-12-29 LAB — URINE MICROSCOPIC-ADD ON

## 2014-12-29 LAB — LACTIC ACID, PLASMA: Lactic Acid, Venous: 6 mmol/L (ref 0.5–2.0)

## 2014-12-29 LAB — LIPASE, BLOOD: Lipase: 19 U/L — ABNORMAL LOW (ref 22–51)

## 2014-12-29 LAB — TROPONIN I

## 2014-12-29 MED ORDER — ONDANSETRON HCL 4 MG/2ML IJ SOLN
4.0000 mg | Freq: Once | INTRAMUSCULAR | Status: AC
  Administered 2014-12-29: 4 mg via INTRAVENOUS

## 2014-12-29 MED ORDER — ONDANSETRON HCL 4 MG/2ML IJ SOLN: 4.0000 mg | Freq: Once | INTRAMUSCULAR | Status: DC

## 2014-12-29 MED ORDER — SODIUM CHLORIDE 0.9 % IV BOLUS (SEPSIS)
1000.0000 mL | Freq: Once | INTRAVENOUS | Status: DC
  Administered 2014-12-30: 1000 mL via INTRAVENOUS

## 2014-12-29 MED ORDER — VANCOMYCIN HCL IN DEXTROSE 1-5 GM/200ML-% IV SOLN
1000.0000 mg | Freq: Once | INTRAVENOUS | Status: AC
  Administered 2014-12-30: 1000 mg via INTRAVENOUS
  Filled 2014-12-29: qty 200

## 2014-12-29 MED ORDER — ONDANSETRON HCL 4 MG/2ML IJ SOLN
INTRAMUSCULAR | Status: AC
  Administered 2014-12-29: 4 mg
  Filled 2014-12-29: qty 2

## 2014-12-29 MED ORDER — DEXTROSE 5 % IV SOLN
1.0000 g | Freq: Once | INTRAVENOUS | Status: AC
  Administered 2014-12-30: 1 g via INTRAVENOUS
  Filled 2014-12-29: qty 1

## 2014-12-29 MED ORDER — SODIUM CHLORIDE 0.9 % IV SOLN
INTRAVENOUS | Status: AC
  Administered 2014-12-30 – 2014-12-31 (×3): via INTRAVENOUS

## 2014-12-29 MED ORDER — SODIUM CHLORIDE 0.9 % IV SOLN
INTRAVENOUS | Status: DC
  Administered 2014-12-29: 22:00:00 via INTRAVENOUS

## 2014-12-29 NOTE — ED Notes (Signed)
Patient's daughter called and is on the way from work.

## 2014-12-29 NOTE — Progress Notes (Addendum)
ANTIBIOTIC CONSULT NOTE-Preliminary  Pharmacy Consult for Ceftazidime and Vancomycin Indication: Empiric coverage  No Known Allergies  Patient Measurements: Height: 5\' 1"  (154.9 cm) Weight: 210 lb (95.255 kg) IBW/kg (Calculated) : 47.8   Vital Signs: Temp: 98.2 F (36.8 C) (07/04 2137) Temp Source: Rectal (07/04 2137) BP: 110/75 mmHg (07/04 2245) Pulse Rate: 103 (07/04 2137)  Labs:  Recent Labs  12/29/14 2158  WBC 35.4*  HGB 11.7*  PLT 368  CREATININE 2.57*    Estimated Creatinine Clearance: 18.1 mL/min (by C-G formula based on Cr of 2.57).  No results for input(s): VANCOTROUGH, VANCOPEAK, VANCORANDOM, GENTTROUGH, GENTPEAK, GENTRANDOM, TOBRATROUGH, TOBRAPEAK, TOBRARND, AMIKACINPEAK, AMIKACINTROU, AMIKACIN in the last 72 hours.   Microbiology: Recent Results (from the past 720 hour(s))  Clostridium Difficile by PCR (not at [facility])     Status: None   Collection Time: 12/01/14 10:44 AM  Result Value Ref Range Status   C difficile by pcr NEGATIVE NEGATIVE Final    Medical History: Past Medical History  Diagnosis Date  . Diabetes mellitus   . Hypertension   . Osteopenia   . Peripheral vascular disease, unspecified   . GERD (gastroesophageal reflux disease)   . Arthritis   . Shortness of breath     exersion  . Pneumonia     child  . Anemia     hx  . Fall at home October 09, 2013  . DM (diabetes mellitus) type II uncontrolled, periph vascular disorder 09/17/2014  . Hepatitis     "a"  . CHF (congestive heart failure) 11/2014    EF 35-40%    Medications:  Ceftazidime 1 gram IV x 1 dose. Vancomycin 1 gram IV x 1 dose  Assessment: Pt. brought in from nursing home with hypotension and lethargy.  Elevated WBC of 35.4 and cultures sent. Estimated CrCl of 18.11ml/min.   Plan:  Preliminary review of pertinent patient information completed.  Protocol will be initiated with a one-time dose of Ceftazidime 1 gram and Vancomycin 1 gram IV.  Jeani HawkingAnnie Penn clinical  pharmacist will complete review during morning rounds to assess patient and finalize treatment regimen.  Hurley CiscoMendenhall, Quinntin Malter D, [facility] 12/29/2014,11:22 PM

## 2014-12-29 NOTE — ED Notes (Signed)
CRITICAL VALUE ALERT  Critical value received: Lactic Acid 6.0  Date of notification:  12/29/14  Time of notification:  2247  Critical value read back:Yes.    Nurse who received alert:  Georg RuddleJustin RN  MD notified (1st page):  Clarene DukeMcManus  Time of first page:  2248  MD notified (2nd page):  Time of second page:  Responding MD:  Clarene DukeMcManus  Time MD responded:  2248

## 2014-12-29 NOTE — ED Notes (Signed)
Pt comes from [facility] with hypotension and lethargic. Pt vomited enroute to facility.

## 2014-12-29 NOTE — ED Provider Notes (Signed)
CSN: [id number]     Arrival date & time 12/29/14  2123 History   First MD Initiated Contact with Patient 12/29/14 2133     Chief Complaint  Patient presents with  . Hypotension      The history is provided by the nursing home and the EMS personnel. The history is limited by the condition of the patient (Hx dementia).  Pt was seen at 2135.  Per EMS and NH report: Pt sent from [facility] nursing facility with c/o "hypotension" and "lethargy" which started an unknown time ago. NH states pt's SBP was "70's." EMS states pt had one episode of N/V en route to the ED. Pt herself has significant hx of dementia and currently denies any complaints, stating she "is ok."    Past Medical History  Diagnosis Date  . Diabetes mellitus   . Hypertension   . Osteopenia   . Peripheral vascular disease, unspecified   . GERD (gastroesophageal reflux disease)   . Arthritis   . Shortness of breath     exersion  . Pneumonia     child  . Anemia     hx  . Fall at home October 09, 2013  . DM (diabetes mellitus) type II uncontrolled, periph vascular disorder 09/17/2014  . Hepatitis     "a"   Past Surgical History  Procedure Laterality Date  . Appendectomy  1994  . Eye surgery Bilateral 08  . Lumbar laminectomy/decompression microdiscectomy N/A 07/09/2013    Procedure: LUMBAR LAMINECTOMY/DECOMPRESSION MICRODISCECTOMY LUMBAR TWO TO LUMBAR FIVE;  Surgeon: Karn Cassis, MD;  Location: [facility];  Service: Neurosurgery;  Laterality: N/A;  . Lumbar laminectomy/decompression microdiscectomy Left 09/17/2014    Procedure: Left Lumbar four-five Foraminotomy;  Surgeon: Hilda Lias, MD;  Location: [facility];  Service: Neurosurgery;  Laterality: Left;  Left L4-5 Foraminotomy  . Hip arthroplasty Left 09/28/2014    Procedure: HEMI ARTHROPLASTY;  Surgeon: Sheral Apley, MD;  Location: [facility] OR;  Service: Orthopedics;  Laterality: Left;   Family History  Problem Relation Age of Onset  . Cancer Mother   .  Hypertension Mother   . Heart disease Father     before age 20  . Heart attack Father   . Hyperlipidemia Son    History  Substance Use Topics  . Smoking status: Former Smoker -- 0.30 packs/day for 1 years    Types: Cigarettes    Quit date: 01/06/1961  . Smokeless tobacco: Never Used  . Alcohol Use: No   OB History    Gravida Para Term Preterm AB TAB SAB Ectopic Multiple Living   3 3 3             Review of Systems  Unable to perform ROS: Dementia      Allergies  Review of patient's allergies indicates no known allergies.  Home Medications   Prior to Admission medications   Medication Sig Start Date End Date Taking? Authorizing Provider  acetaminophen (TYLENOL) 650 MG CR tablet Take 650 mg by mouth every 6 (six) hours as needed for pain.   Yes Historical Provider, MD  albuterol (PROVENTIL) (2.5 MG/3ML) 0.083% nebulizer solution Take 3 mLs (2.5 mg total) by nebulization every 4 (four) hours as needed for wheezing. 12/10/14  Yes Pearson Grippe, MD  Amino Acids-Protein Hydrolys (FEEDING SUPPLEMENT, PRO-STAT SUGAR FREE 64,) LIQD Take 30 mLs by mouth 3 (three) times daily with meals.    Yes Historical Provider, MD  atorvastatin (LIPITOR) 20 MG tablet Take 20 mg by  mouth daily.   Yes Historical Provider, MD  camphor-menthol Wynelle Fanny(SARNA) lotion Apply topically as needed for itching. 12/10/14  Yes Pearson GrippeJames Kim, MD  cholecalciferol (VITAMIN D) 1000 UNITS tablet Take 1,000 Units by mouth daily.   Yes Historical Provider, MD  ezetimibe (ZETIA) 10 MG tablet Take 10 mg by mouth daily at 6 PM.    Yes Historical Provider, MD  feeding supplement, GLUCERNA SHAKE, (GLUCERNA SHAKE) LIQD Take 237 mLs by mouth 3 (three) times daily between meals. 12/10/14  Yes Pearson GrippeJames Kim, MD  furosemide (LASIX) 10 MG/ML injection Inject 4 mLs (40 mg total) into the vein once. Patient taking differently: Inject 40 mg into the vein daily.  12/10/14  Yes Pearson GrippeJames Kim, MD  guaiFENesin (MUCINEX) 600 MG 12 hr tablet Take 600 mg by mouth 2  (two) times daily.    Yes Historical Provider, MD  hydrALAZINE (APRESOLINE) 10 MG tablet Take 1 tablet (10 mg total) by mouth every 8 (eight) hours. 12/10/14  Yes Pearson GrippeJames Kim, MD  insulin aspart (NOVOLOG) 100 UNIT/ML injection Inject 1-9 Units into the skin 3 (three) times daily before meals. Per sliding scale: CBG 121-150 1 unit, 151-200 2 units; 201-250 3 units; 251-300 5 units, 301-351 7 units, 351-400 9 units;>401 call MD   Yes Historical Provider, MD  LORazepam (ATIVAN) 0.5 MG tablet Take 1 tablet (0.5 mg total) by mouth every 6 (six) hours as needed for anxiety. 09/30/14  Yes Catarina Hartshornavid Tat, MD  Melatonin-Pyridoxine (MELATONEX PO) Take 3 mg by mouth at bedtime.   Yes Historical Provider, MD  Methylfol-Methylcob-Acetylcyst 6-2-600 MG TABS Take 1 tablet by mouth daily.   Yes Historical Provider, MD  metoprolol succinate (TOPROL-XL) 25 MG 24 hr tablet Take 0.5 tablets (12.5 mg total) by mouth daily. 12/10/14  Yes Pearson GrippeJames Kim, MD  omeprazole (PRILOSEC) 20 MG capsule Take 20 mg by mouth daily.   Yes Historical Provider, MD  ondansetron (ZOFRAN) 4 MG tablet Take 1 tablet (4 mg total) by mouth every 8 (eight) hours as needed for nausea or vomiting. 09/28/14  Yes Brittney Kelly, PA-C  piperacillin-tazobactam (ZOSYN) 3.375 GM/50ML IVPB Inject 3.375 g into the vein every 6 (six) hours. Starting 12/28/2014 x 7 days for UTI   Yes Historical Provider, MD  polyvinyl alcohol (LIQUIFILM TEARS) 1.4 % ophthalmic solution Place 1 drop into both eyes 3 (three) times daily as needed for dry eyes.   Yes Historical Provider, MD  potassium chloride SA (K-DUR,KLOR-CON) 20 MEQ tablet Take 1 tablet (20 mEq total) by mouth daily. 12/10/14  Yes Pearson GrippeJames Kim, MD  senna-docusate (SENOKOT-S) 8.6-50 MG per tablet Take 1 tablet by mouth 2 (two) times daily. Hold for loose stool   Yes Historical Provider, MD  sodium bicarbonate 650 MG tablet Take 1 tablet (650 mg total) by mouth 3 (three) times daily. 12/10/14  Yes Pearson GrippeJames Kim, MD  furosemide (LASIX) 40  MG tablet Take 1 tablet (40 mg total) by mouth 2 (two) times daily. Patient not taking: Reported on 12/29/2014 12/10/14   Pearson GrippeJames Kim, MD   BP 113/77 mmHg  Pulse 103  Temp(Src) 98.2 F (36.8 C) (Rectal)  Resp 26  Ht 5\' 1"  (1.549 m)  Wt 210 lb (95.255 kg)  BMI 39.70 kg/m2  SpO2 95%  Filed Vitals:   12/29/14 2200 12/29/14 2215 12/29/14 2230 12/29/14 2245  BP: 104/90 99/37 93/35  110/75  Pulse:      Temp:      TempSrc:      Resp: 26 39 23 22  Height:  Weight:      SpO2:        Physical Exam 2140: Physical examination:  Nursing notes reviewed; Vital signs and O2 SAT reviewed;  Constitutional: Well developed, Well nourished, In no acute distress; Head:  Normocephalic, atraumatic; Eyes: EOMI, PERRL, No scleral icterus; ENMT: Mouth and pharynx normal, Mucous membranes dry; Neck: Supple, Full range of motion,; Cardiovascular: Tachycardic rate and rhythm, No gallop; Respiratory: Breath sounds coarse & equal bilaterally, No wheezes.  Speaking full sentences with ease, Normal respiratory effort/excursion; Chest: Nontender, Movement normal; Abdomen: Soft, Nontender, Nondistended, Normal bowel sounds; Genitourinary: +foley in place with thick cloudy yellow urine in tubing and bag.; Extremities: Pulses normal, No deformity. +1 pedal edema bilat without calf asymmetry. Soft boots on bilat LE's.; Neuro: Awake, alert, confused re: time, place, events per hx dementia. No facial droop. Speech clear. Moves extremities on stretcher spontaneously and to command..; Skin: Color normal, Warm, Dry.   ED Course  Procedures      EKG Interpretation   Date/Time:  Monday December 29 2014 21:34:58 EDT Ventricular Rate:  101 PR Interval:  186 QRS Duration: 83 QT Interval:  366 QTC Calculation: 474 R Axis:   33 Text Interpretation:  Sinus tachycardia Low voltage, precordial leads  Artifact When compared with ECG of 11/28/2014 Rate faster Confirmed by  [facility]  MD, Nicholos Johns [phone]) on 12/29/2014 10:53:57 PM        MDM  MDM Reviewed: previous chart, nursing note and vitals Reviewed previous: labs and ECG Interpretation: labs, ECG and x-ray Total time providing critical care: 30-74 minutes. This excludes time spent performing separately reportable procedures and services. Consults: admitting MD   CRITICAL CARE Performed by: Laray Anger Total critical care time: 35 Critical care time was exclusive of separately billable procedures and treating other patients. Critical care was necessary to treat or prevent imminent or life-threatening deterioration. Critical care was time spent personally by me on the following activities: development of treatment plan with patient and/or surrogate as well as nursing, discussions with consultants, evaluation of patient's response to treatment, examination of patient, obtaining history from patient or surrogate, ordering and performing treatments and interventions, ordering and review of laboratory studies, ordering and review of radiographic studies, pulse oximetry and re-evaluation of patient's condition.    Results for orders placed or performed during the hospital encounter of 12/29/14  Urinalysis, Routine w reflex microscopic (not at [facility])  Result Value Ref Range   Color, Urine YELLOW YELLOW   APPearance CLOUDY (A) CLEAR   Specific Gravity, Urine 1.025 1.005 - 1.030   pH 5.5 5.0 - 8.0   Glucose, UA NEGATIVE NEGATIVE mg/dL   Hgb urine dipstick LARGE (A) NEGATIVE   Bilirubin Urine NEGATIVE NEGATIVE   Ketones, ur NEGATIVE NEGATIVE mg/dL   Protein, ur 914 (A) NEGATIVE mg/dL   Urobilinogen, UA 0.2 0.0 - 1.0 mg/dL   Nitrite NEGATIVE NEGATIVE   Leukocytes, UA LARGE (A) NEGATIVE  Lactic acid, plasma  Result Value Ref Range   Lactic Acid, Venous 6.0 (HH) 0.5 - 2.0 mmol/L  Troponin I  Result Value Ref Range   Troponin I <0.03 <0.031 ng/mL  CBC with Differential  Result Value Ref Range   WBC 35.4 (H) 4.0 - 10.5 K/uL   RBC 3.85 (L) 3.87 - 5.11 MIL/uL    Hemoglobin 11.7 (L) 12.0 - 15.0 g/dL   HCT 78.2 95.6 - 21.3 %   MCV 94.3 78.0 - 100.0 fL   MCH 30.4 26.0 - 34.0 pg   MCHC  32.2 30.0 - 36.0 g/dL   RDW 16.1 (H) 09.6 - 04.5 %   Platelets 368 150 - 400 K/uL   Neutrophils Relative % 92 (H) 43 - 77 %   Lymphocytes Relative 4 (L) 12 - 46 %   Monocytes Relative 3 3 - 12 %   Eosinophils Relative 1 0 - 5 %   Basophils Relative 0 0 - 1 %   Neutro Abs 32.5 (H) 1.7 - 7.7 K/uL   Lymphs Abs 1.4 0.7 - 4.0 K/uL   Monocytes Absolute 1.1 (H) 0.1 - 1.0 K/uL   Eosinophils Absolute 0.4 0.0 - 0.7 K/uL   Basophils Absolute 0.0 0.0 - 0.1 K/uL   WBC Morphology MILD LEFT SHIFT (1-5% METAS, OCC MYELO, OCC BANDS)   Comprehensive metabolic panel  Result Value Ref Range   Sodium 131 (L) 135 - 145 mmol/L   Potassium 4.7 3.5 - 5.1 mmol/L   Chloride 104 101 - 111 mmol/L   CO2 8 (L) 22 - 32 mmol/L   Glucose, Bld 345 (H) 65 - 99 mg/dL   BUN 78 (H) 6 - 20 mg/dL   Creatinine, Ser 4.09 (H) 0.44 - 1.00 mg/dL   Calcium 9.3 8.9 - 81.1 mg/dL   Total Protein 6.4 (L) 6.5 - 8.1 g/dL   Albumin 2.3 (L) 3.5 - 5.0 g/dL   AST 45 (H) 15 - 41 U/L   ALT 21 14 - 54 U/L   Alkaline Phosphatase 113 38 - 126 U/L   Total Bilirubin 0.5 0.3 - 1.2 mg/dL   GFR calc non Af Amer 16 (L) >60 mL/min   GFR calc Af Amer 19 (L) >60 mL/min   Anion gap 19 (H) 5 - 15  Lipase, blood  Result Value Ref Range   Lipase 19 (L) 22 - 51 U/L  Urine microscopic-add on  Result Value Ref Range   WBC, UA TOO NUMEROUS TO COUNT <3 WBC/hpf    Dg Chest Port 1 View 12/29/2014   CLINICAL DATA:  Acute onset of vomiting, with severe fatigue and generalized weakness. Initial encounter.  EXAM: PORTABLE CHEST - 1 VIEW  COMPARISON:  Chest radiograph performed 11/30/2014  FINDINGS: The lungs are hypoexpanded. Mild left basilar airspace opacity could reflect mild pneumonia, given the patient's symptoms. No pleural effusion or pneumothorax is seen.  The cardiomediastinal silhouette is borderline normal in size. A  right-sided tunneled catheter is noted ending about the mid SVC. No acute osseous abnormalities are identified.  IMPRESSION: Lungs hypoexpanded. Mild left basilar opacity could reflect mild pneumonia, given the patient's symptoms.   Electronically Signed   By: Roanna Raider M.D.   On: 12/29/2014 22:01   Results for FELICIANA, NARAYAN (MRN [id number]) as of 12/29/2014 23:37  Ref. Range 12/08/2014 05:45 12/09/2014 05:28 12/10/2014 04:04 12/29/2014 21:58  BUN Latest Ref Range: 6-20 mg/dL 25 (H) 25 (H) 26 (H) 78 (H)  Creatinine Latest Ref Range: 0.44-1.00 mg/dL 2.13 (H) 0.86 (H) 5.78 (H) 2.57 (H)    2325:  Afebrile via rectal temp. Pt normotensive on arrival to ED, now SBP starting to drift down into 90's. Judicious IVF boluses ordered d/t pt's hx CHF (EF 35-40% on last Echo) with SBP increasing to 107, then 110.  BUN/Cr elevated from baseline. +UTI on Udip, and mild opacity on CXR: will dose IV abx for UTI and HCAP after UC and BC obtained. Pt is currently smiling and talking with daughter at bedside, resps easy, NAD. Pt continues to deny any complaints. Dx and  testing d/w pt and family.  Questions answered.  Verb understanding, agreeable to admit.  T/C to [facility] Dr. Sharl Ma, case discussed, including:  HPI, pertinent PM/SHx, VS/PE, dx testing, ED course and treatment:  Agreeable to admit, requests to write temporary orders, obtain stepdown bed to team APAdmits.     Samuel Jester, DO 12/31/14 1126

## 2014-12-30 DIAGNOSIS — J189 Pneumonia, unspecified organism: Secondary | ICD-10-CM | POA: Diagnosis present

## 2014-12-30 DIAGNOSIS — E872 Acidosis, unspecified: Secondary | ICD-10-CM | POA: Diagnosis present

## 2014-12-30 DIAGNOSIS — I5022 Chronic systolic (congestive) heart failure: Secondary | ICD-10-CM | POA: Diagnosis present

## 2014-12-30 DIAGNOSIS — D72829 Elevated white blood cell count, unspecified: Secondary | ICD-10-CM

## 2014-12-30 DIAGNOSIS — A419 Sepsis, unspecified organism: Secondary | ICD-10-CM | POA: Diagnosis present

## 2014-12-30 DIAGNOSIS — E1129 Type 2 diabetes mellitus with other diabetic kidney complication: Secondary | ICD-10-CM

## 2014-12-30 DIAGNOSIS — N183 Chronic kidney disease, stage 3 unspecified: Secondary | ICD-10-CM | POA: Diagnosis present

## 2014-12-30 DIAGNOSIS — E860 Dehydration: Secondary | ICD-10-CM

## 2014-12-30 DIAGNOSIS — N179 Acute kidney failure, unspecified: Secondary | ICD-10-CM

## 2014-12-30 LAB — CBC
HEMATOCRIT: 30.6 % — AB (ref 36.0–46.0)
HEMOGLOBIN: 9.9 g/dL — AB (ref 12.0–15.0)
MCH: 29.9 pg (ref 26.0–34.0)
MCHC: 32.4 g/dL (ref 30.0–36.0)
MCV: 92.4 fL (ref 78.0–100.0)
Platelets: 339 10*3/uL (ref 150–400)
RBC: 3.31 MIL/uL — ABNORMAL LOW (ref 3.87–5.11)
RDW: 18.2 % — ABNORMAL HIGH (ref 11.5–15.5)
WBC: 32.3 10*3/uL — ABNORMAL HIGH (ref 4.0–10.5)

## 2014-12-30 LAB — LACTIC ACID, PLASMA
LACTIC ACID, VENOUS: 4 mmol/L — AB (ref 0.5–2.0)
Lactic Acid, Venous: 2.1 mmol/L (ref 0.5–2.0)

## 2014-12-30 LAB — GLUCOSE, CAPILLARY
GLUCOSE-CAPILLARY: 250 mg/dL — AB (ref 65–99)
GLUCOSE-CAPILLARY: 174 mg/dL — AB (ref 65–99)
Glucose-Capillary: 166 mg/dL — ABNORMAL HIGH (ref 65–99)
Glucose-Capillary: 161 mg/dL — ABNORMAL HIGH (ref 65–99)
Glucose-Capillary: 190 mg/dL — ABNORMAL HIGH (ref 65–99)

## 2014-12-30 LAB — COMPREHENSIVE METABOLIC PANEL
ALBUMIN: 2 g/dL — AB (ref 3.5–5.0)
ALK PHOS: 91 U/L (ref 38–126)
ALT: 28 U/L (ref 14–54)
ANION GAP: 14 (ref 5–15)
AST: 62 U/L — ABNORMAL HIGH (ref 15–41)
BUN: 79 mg/dL — AB (ref 6–20)
CO2: 12 mmol/L — ABNORMAL LOW (ref 22–32)
Calcium: 8.6 mg/dL — ABNORMAL LOW (ref 8.9–10.3)
Chloride: 108 mmol/L (ref 101–111)
Creatinine, Ser: 2.41 mg/dL — ABNORMAL HIGH (ref 0.44–1.00)
GFR calc Af Amer: 21 mL/min — ABNORMAL LOW (ref >60)
GFR calc non Af Amer: 18 mL/min — ABNORMAL LOW (ref >60)
Glucose, Bld: 204 mg/dL — ABNORMAL HIGH (ref 65–99)
POTASSIUM: 4.2 mmol/L (ref 3.5–5.1)
Sodium: 134 mmol/L — ABNORMAL LOW (ref 135–145)
TOTAL PROTEIN: 5.6 g/dL — AB (ref 6.5–8.1)
Total Bilirubin: 0.4 mg/dL (ref 0.3–1.2)

## 2014-12-30 LAB — MRSA PCR SCREENING: MRSA BY PCR: NEGATIVE

## 2014-12-30 MED ORDER — SODIUM BICARBONATE 650 MG PO TABS
650.0000 mg | ORAL_TABLET | Freq: Three times a day (TID) | ORAL | Status: DC
  Administered 2014-12-31 – 2015-01-07 (×11): 650 mg via ORAL
  Filled 2014-12-30 (×15): qty 1

## 2014-12-30 MED ORDER — EZETIMIBE 10 MG PO TABS: 10.0000 mg | ORAL_TABLET | Freq: Every day | ORAL | Status: DC

## 2014-12-30 MED ORDER — ENOXAPARIN SODIUM 40 MG/0.4ML ~~LOC~~ SOLN
40.0000 mg | SUBCUTANEOUS | Status: DC
  Administered 2014-12-30: 40 mg via SUBCUTANEOUS
  Filled 2014-12-30: qty 0.4

## 2014-12-30 MED ORDER — ONDANSETRON HCL 4 MG/2ML IJ SOLN
4.0000 mg | Freq: Four times a day (QID) | INTRAMUSCULAR | Status: DC | PRN
  Administered 2014-12-30: 4 mg via INTRAVENOUS
  Filled 2014-12-30: qty 2

## 2014-12-30 MED ORDER — ENOXAPARIN SODIUM 30 MG/0.3ML ~~LOC~~ SOLN
30.0000 mg | SUBCUTANEOUS | Status: DC
  Administered 2014-12-31 – 2015-01-01 (×2): 30 mg via SUBCUTANEOUS
  Filled 2014-12-30 (×2): qty 0.3

## 2014-12-30 MED ORDER — VANCOMYCIN HCL IN DEXTROSE 1-5 GM/200ML-% IV SOLN
1000.0000 mg | INTRAVENOUS | Status: DC
  Administered 2014-12-31: 1000 mg via INTRAVENOUS
  Filled 2014-12-30: qty 200

## 2014-12-30 MED ORDER — PANTOPRAZOLE SODIUM 40 MG PO TBEC
40.0000 mg | DELAYED_RELEASE_TABLET | Freq: Every day | ORAL | Status: DC
  Filled 2014-12-30: qty 1

## 2014-12-30 MED ORDER — DEXTROSE 5 % IV SOLN
INTRAVENOUS | Status: AC
  Filled 2014-12-30: qty 1

## 2014-12-30 MED ORDER — ONDANSETRON HCL 4 MG PO TABS: 4.0000 mg | ORAL_TABLET | Freq: Four times a day (QID) | ORAL | Status: DC | PRN

## 2014-12-30 MED ORDER — CAMPHOR-MENTHOL 0.5-0.5 % EX LOTN
TOPICAL_LOTION | CUTANEOUS | Status: DC | PRN
  Administered 2015-01-01: 08:00:00 via TOPICAL
  Filled 2014-12-30: qty 222

## 2014-12-30 MED ORDER — ATORVASTATIN CALCIUM 20 MG PO TABS
20.0000 mg | ORAL_TABLET | Freq: Every day | ORAL | Status: DC
  Filled 2014-12-30: qty 1

## 2014-12-30 MED ORDER — INSULIN ASPART 100 UNIT/ML ~~LOC~~ SOLN
0.0000 [IU] | Freq: Three times a day (TID) | SUBCUTANEOUS | Status: DC
  Administered 2014-12-30 (×3): 2 [IU] via SUBCUTANEOUS

## 2014-12-30 MED ORDER — DEXTROSE 5 % IV SOLN
1.0000 g | INTRAVENOUS | Status: DC
  Administered 2014-12-30 – 2015-01-01 (×3): 1 g via INTRAVENOUS
  Filled 2014-12-30 (×6): qty 1

## 2014-12-30 NOTE — Plan of Care (Signed)
Problem: Consults Goal: General Medical Patient Education See Patient Education Module for specific education. Outcome: Not Progressing Pt is oriented to self & intermittently to place  Goal: Skin Care Protocol Initiated - if Braden Score 18 or less If consults are not indicated, leave blank or document N/A Outcome: Progressing Pt has 2 stage 2 decubitus's on R buttock & R gluteal fold with both covered with pink foam drsgs Goal: Diabetes Guidelines if Diabetic/Glucose > 140 If diabetic or lab glucose is > 140 mg/dl - Initiate Diabetes/Hyperglycemia Guidelines & Document Interventions  Outcome: Progressing Pt is on s/s insulin coverage

## 2014-12-30 NOTE — Progress Notes (Signed)
Patient admitted to the hospital earlier this morning by Dr. Sharl MaLama.  Patient seen and examined. She is confused. She is laying in bed, appears chronically ill, has some crackles at left base, but otherwise clear lung sounds. Trace edema in lower extremities. Abdominal exam is otherwise benign.  She was admitted to the hospital with acute on chronic renal failure, metabolic/lactic acidosis, possible pneumonia, sepsis. She has a history of systolic CHF with EF 35-40% and is very prone to going into heart failure. She has low albumin, is essentially bed bound and her daughter feels that she is failing to thrive.  She has been started on intravenous antibiotics and IV fluids. Will monitor respiratory status closely with ongoing hydration. Blood pressures have improved and lactic acid is trending down. Serum bicarbonate improving. Will continue current treatments. Discussed the need for palliative care consult with her daughter who is agreeable to meet with palliative care service. Continue to monitor closely.  Tina [name]

## 2014-12-30 NOTE — H&P (Signed)
PCP:   Pearson Grippe, MD   Chief Complaint:  Hypotension  HPI:  81 year old female who  has a past medical history of Diabetes mellitus; Hypertension; Osteopenia; Peripheral vascular disease, unspecified; GERD (gastroesophageal reflux disease); Arthritis; Shortness of breath; Pneumonia; Anemia; Fall at home (October 09, 2013); DM (diabetes mellitus) type II uncontrolled, periph vascular disorder (09/17/2014); Hepatitis; and CHF (congestive heart failure) (11/2014). Today patient was brought to the ED from skilled facility after patient was found to be hypotensive and lethargic. As per nursing staff at the skilled facility patient's SBP was in 70s. Patient had one episode of vomiting en route to the ED. Patient has history of dementia and denies any complaints. She denies chest pain, no shortness of breath no abdominal pain no nausea vomiting or diarrhea. Patient knows that she is at [facility] hospital. In the ED patient was found to be in acute kidney injury with elevated lactic acid 6.0. Patient responded to the IV fluids and her blood pressure has not improved. Patient does have a history of metabolic acidosis and was getting sodium bicarbonate tablets 3 times a day. Patient was discharged on Lasix 40 mg by mouth twice a day on 12/09/2014.  Today's chest x-ray revealed pneumonia. And patient is now being admitted for healthcare associated pneumonia.  Allergies:  No Known Allergies    Past Medical History  Diagnosis Date  . Diabetes mellitus   . Hypertension   . Osteopenia   . Peripheral vascular disease, unspecified   . GERD (gastroesophageal reflux disease)   . Arthritis   . Shortness of breath     exersion  . Pneumonia     child  . Anemia     hx  . Fall at home October 09, 2013  . DM (diabetes mellitus) type II uncontrolled, periph vascular disorder 09/17/2014  . Hepatitis     "a"  . CHF (congestive heart failure) 11/2014    EF 35-40%    Past Surgical History  Procedure Laterality Date   . Appendectomy  1994  . Eye surgery Bilateral 08  . Lumbar laminectomy/decompression microdiscectomy N/A 07/09/2013    Procedure: LUMBAR LAMINECTOMY/DECOMPRESSION MICRODISCECTOMY LUMBAR TWO TO LUMBAR FIVE;  Surgeon: Karn Cassis, MD;  Location: [facility];  Service: Neurosurgery;  Laterality: N/A;  . Lumbar laminectomy/decompression microdiscectomy Left 09/17/2014    Procedure: Left Lumbar four-five Foraminotomy;  Surgeon: Hilda Lias, MD;  Location: [facility];  Service: Neurosurgery;  Laterality: Left;  Left L4-5 Foraminotomy  . Hip arthroplasty Left 09/28/2014    Procedure: HEMI ARTHROPLASTY;  Surgeon: Sheral Apley, MD;  Location: [facility];  Service: Orthopedics;  Laterality: Left;    Prior to Admission medications   Medication Sig Start Date End Date Taking? Authorizing Provider  acetaminophen (TYLENOL) 650 MG CR tablet Take 650 mg by mouth every 6 (six) hours as needed for pain.   Yes Historical Provider, MD  albuterol (PROVENTIL) (2.5 MG/3ML) 0.083% nebulizer solution Take 3 mLs (2.5 mg total) by nebulization every 4 (four) hours as needed for wheezing. 12/10/14  Yes Pearson Grippe, MD  Amino Acids-Protein Hydrolys (FEEDING SUPPLEMENT, PRO-STAT SUGAR FREE 64,) LIQD Take 30 mLs by mouth 3 (three) times daily with meals.    Yes Historical Provider, MD  atorvastatin (LIPITOR) 20 MG tablet Take 20 mg by mouth daily.   Yes Historical Provider, MD  camphor-menthol Wynelle Fanny) lotion Apply topically as needed for itching. 12/10/14  Yes Pearson Grippe, MD  cholecalciferol (VITAMIN D) 1000 UNITS tablet Take 1,000  Units by mouth daily.   Yes Historical Provider, MD  ezetimibe (ZETIA) 10 MG tablet Take 10 mg by mouth daily at 6 PM.    Yes Historical Provider, MD  feeding supplement, GLUCERNA SHAKE, (GLUCERNA SHAKE) LIQD Take 237 mLs by mouth 3 (three) times daily between meals. 12/10/14  Yes Pearson Grippe, MD  furosemide (LASIX) 10 MG/ML injection Inject 4 mLs (40 mg total) into the vein once. Patient taking  differently: Inject 40 mg into the vein daily.  12/10/14  Yes Pearson Grippe, MD  guaiFENesin (MUCINEX) 600 MG 12 hr tablet Take 600 mg by mouth 2 (two) times daily.    Yes Historical Provider, MD  hydrALAZINE (APRESOLINE) 10 MG tablet Take 1 tablet (10 mg total) by mouth every 8 (eight) hours. 12/10/14  Yes Pearson Grippe, MD  insulin aspart (NOVOLOG) 100 UNIT/ML injection Inject 1-9 Units into the skin 3 (three) times daily before meals. Per sliding scale: CBG 121-150 1 unit, 151-200 2 units; 201-250 3 units; 251-300 5 units, 301-351 7 units, 351-400 9 units;>401 call MD   Yes Historical Provider, MD  LORazepam (ATIVAN) 0.5 MG tablet Take 1 tablet (0.5 mg total) by mouth every 6 (six) hours as needed for anxiety. 09/30/14  Yes Catarina Hartshorn, MD  Melatonin-Pyridoxine (MELATONEX PO) Take 3 mg by mouth at bedtime.   Yes Historical Provider, MD  Methylfol-Methylcob-Acetylcyst 6-2-600 MG TABS Take 1 tablet by mouth daily.   Yes Historical Provider, MD  metoprolol succinate (TOPROL-XL) 25 MG 24 hr tablet Take 0.5 tablets (12.5 mg total) by mouth daily. 12/10/14  Yes Pearson Grippe, MD  omeprazole (PRILOSEC) 20 MG capsule Take 20 mg by mouth daily.   Yes Historical Provider, MD  ondansetron (ZOFRAN) 4 MG tablet Take 1 tablet (4 mg total) by mouth every 8 (eight) hours as needed for nausea or vomiting. 09/28/14  Yes Brittney Kelly, PA-C  piperacillin-tazobactam (ZOSYN) 3.375 GM/50ML IVPB Inject 3.375 g into the vein every 6 (six) hours. Starting 12/28/2014 x 7 days for UTI   Yes Historical Provider, MD  polyvinyl alcohol (LIQUIFILM TEARS) 1.4 % ophthalmic solution Place 1 drop into both eyes 3 (three) times daily as needed for dry eyes.   Yes Historical Provider, MD  potassium chloride SA (K-DUR,KLOR-CON) 20 MEQ tablet Take 1 tablet (20 mEq total) by mouth daily. 12/10/14  Yes Pearson Grippe, MD  senna-docusate (SENOKOT-S) 8.6-50 MG per tablet Take 1 tablet by mouth 2 (two) times daily. Hold for loose stool   Yes Historical Provider, MD    sodium bicarbonate 650 MG tablet Take 1 tablet (650 mg total) by mouth 3 (three) times daily. 12/10/14  Yes Pearson Grippe, MD  furosemide (LASIX) 40 MG tablet Take 1 tablet (40 mg total) by mouth 2 (two) times daily. Patient not taking: Reported on 12/29/2014 12/10/14   Pearson Grippe, MD    Social History:  reports that she quit smoking about 54 years ago. Her smoking use included Cigarettes. She has a .3 pack-year smoking history. She has never used smokeless tobacco. She reports that she does not drink alcohol or use illicit drugs.  Family History  Problem Relation Age of Onset  . Cancer Mother   . Hypertension Mother   . Heart disease Father     before age 61  . Heart attack Father   . Hyperlipidemia Son     Ceasar Mons Weights   12/29/14 2137  Weight: 95.255 kg (210 lb)      Review of Systems:  As the history  of present illness, rest of the review of systems are unobtainable due to underlying dementia   Physical Exam: Blood pressure 110/75, pulse 103, temperature 98.2 F (36.8 C), temperature source Rectal, resp. rate 22, height 5\' 1"  (1.549 m), weight 95.255 kg (210 lb), SpO2 95 %. Constitutional:   Patient is a well-developed and well-nourished female* in no acute distress and cooperative with exam. Head: Normocephalic and atraumatic Mouth: Mucus membranes moist Eyes: PERRL, EOMI, conjunctivae normal Neck: Supple, No Thyromegaly Cardiovascular: RRR, S1 normal, S2 normal Pulmonary/Chest: CTAB, no wheezes, rales, or rhonchi Abdominal: Soft. Non-tender, non-distended, bowel sounds are normal, no masses, organomegaly, or guarding present.  Neurological: Alert and oriented 2, moving all extremities Extremities : No Cyanosis, Clubbing or Edema  Labs on Admission:  Basic Metabolic Panel:  Recent Labs Lab 12/29/14 2158  NA 131*  K 4.7  CL 104  CO2 8*  GLUCOSE 345*  BUN 78*  CREATININE 2.57*  CALCIUM 9.3   Liver Function Tests:  Recent Labs Lab 12/29/14 2158  AST 45*   ALT 21  ALKPHOS 113  BILITOT 0.5  PROT 6.4*  ALBUMIN 2.3*    Recent Labs Lab 12/29/14 2158  LIPASE 19*   No results for input(s): AMMONIA in the last 168 hours. CBC:  Recent Labs Lab 12/29/14 2158  WBC 35.4*  NEUTROABS 32.5*  HGB 11.7*  HCT 36.3  MCV 94.3  PLT 368   Cardiac Enzymes:  Recent Labs Lab 12/29/14 2158  TROPONINI <0.03    BNP (last 3 results)  Recent Labs  11/28/14 1924  BNP 752.6*    ProBNP (last 3 results) No results for input(s): PROBNP in the last 8760 hours.  CBG: No results for input(s): GLUCAP in the last 168 hours.  Radiological Exams on Admission: Dg Chest Port 1 View  12/29/2014   CLINICAL DATA:  Acute onset of vomiting, with severe fatigue and generalized weakness. Initial encounter.  EXAM: PORTABLE CHEST - 1 VIEW  COMPARISON:  Chest radiograph performed 11/30/2014  FINDINGS: The lungs are hypoexpanded. Mild left basilar airspace opacity could reflect mild pneumonia, given the patient's symptoms. No pleural effusion or pneumothorax is seen.  The cardiomediastinal silhouette is borderline normal in size. A right-sided tunneled catheter is noted ending about the mid SVC. No acute osseous abnormalities are identified.  IMPRESSION: Lungs hypoexpanded. Mild left basilar opacity could reflect mild pneumonia, given the patient's symptoms.   Electronically Signed   By: Roanna RaiderJeffery  Chang M.D.   On: 12/29/2014 22:01    EKG: Independently reviewed. Sinus tachycardia   Assessment/Plan Active Problems:   AKI (acute kidney injury)   Leukocytosis   DM type 2 (diabetes mellitus, type 2)   HCAP (healthcare-associated pneumonia)  Healthcare associated pneumonia/sepsis Patient presenting with hypotension, elevated lactic acid, chest x-ray showing pneumonia. We'll start the patient on vancomycin and Ceftazidime. Follow blood culture results.  Hypotension Secondary to sepsis as well as Lasix. Will hold the Lasix, patient has responded well to the IV  fluid challenge. Will continue with gentle IV hydration as patient has a history of CHF.  Leukocytosis Patient has significant evidence white count 35,000, antibiotic started due to HCAP, repeat CBC in a.m.  Diabetes mellitus Start sliding scale insulin with NovoLog  Acute kidney injury on CKD Patients being creatinine today is 78/2.57, previous BUN/creatinine on 12/10/2014 was 26/1.65. Started on IV fluids. Repeat BMP in a.m.  Metabolic acidosis Patient has significant metabolic acidosis, lactic acid 6.0. Likely from hypoperfusion and infection. We will repeat lactic acid  in 3 hours, in the meantime continue sodium bicarbonate 650 mg 3 times a day    Code status: Full code  Family discussion: No family at bedside   Time Spent on Admission: 60 min  Aaliyah Cancro S [facility] Pager: [phone] 12/30/2014, 12:13 AM  If 7PM-7AM, please contact night-coverage  [email or web address]  Password [email or web address]

## 2014-12-30 NOTE — Progress Notes (Signed)
Dr Sharl MaLama updated on Lactic acid of 4.0  & cbg=250

## 2014-12-30 NOTE — Progress Notes (Signed)
Dr Kerry HoughMemon updated on Pt's lactic acid=2.1

## 2014-12-30 NOTE — Progress Notes (Signed)
ANTIBIOTIC CONSULT NOTE  Pharmacy Consult for Vancomycin / Ceftazidime Indication: pneumonia / UTI / Sepsis  No Known Allergies  Patient Measurements: Height: 5' 3.5" (161.3 cm) Weight: 193 lb 5.5 oz (87.7 kg) IBW/kg (Calculated) : 53.55 Vital Signs: Temp: 97.7 F (36.5 C) (07/05 0400) Temp Source: Oral (07/05 0400) BP: 113/60 mmHg (07/05 0600) Pulse Rate: 100 (07/05 0600) Intake/Output from previous day: 07/04 0701 - 07/05 0700 In: 776.7 [I.V.:526.7; IV Piggyback:250] Out: 150 [Urine:150] Intake/Output from this shift:    Labs:  Recent Labs  12/29/14 2158 12/30/14 0520  WBC 35.4* 32.3*  HGB 11.7* 9.9*  PLT 368 339  CREATININE 2.57* 2.41*   Estimated Creatinine Clearance: 19.4 mL/min (by C-G formula based on Cr of 2.41). No results for input(s): VANCOTROUGH, VANCOPEAK, VANCORANDOM, GENTTROUGH, GENTPEAK, GENTRANDOM, TOBRATROUGH, TOBRAPEAK, TOBRARND, AMIKACINPEAK, AMIKACINTROU, AMIKACIN in the last 72 hours.   Microbiology: Recent Results (from the past 720 hour(s))  Clostridium Difficile by PCR (not [facility])     Status: None   Collection Time: 12/01/14 10:44 AM  Result Value Ref Range Status   C difficile by pcr NEGATIVE NEGATIVE Final  Blood culture (routine x 2)     Status: None (Preliminary result)   Collection Time: 12/29/14  9:58 PM  Result Value Ref Range Status   Specimen Description BLOOD PORT  Final   Special Requests   Final    BOTTLES DRAWN AEROBIC AND ANAEROBIC AEB 11CC ANA 6CC   Culture PENDING  Incomplete   Report Status PENDING  Incomplete  Blood culture (routine x 2)     Status: None (Preliminary result)   Collection Time: 12/30/14 12:00 AM  Result Value Ref Range Status   Specimen Description BLOOD PORT  Final   Special Requests BOTTLES DRAWN AEROBIC ONLY 8CC  Final   Culture PENDING  Incomplete   Report Status PENDING  Incomplete  MRSA PCR Screening     Status: None   Collection Time: 12/30/14 12:21 AM  Result Value Ref Range Status   MRSA by PCR NEGATIVE NEGATIVE Final    Comment:        The GeneXpert MRSA Assay (FDA approved for NASAL specimens only), is one component of a comprehensive MRSA colonization surveillance program. It is not intended to diagnose MRSA infection nor to guide or monitor treatment for MRSA infections.     Anti-infectives    Start     Dose/Rate Route Frequency Ordered Stop   12/29/14 2345  vancomycin (VANCOCIN) IVPB 1000 mg/200 mL premix     1,000 mg 200 mL/hr over 60 Minutes Intravenous  Once 12/29/14 2333 12/30/14 0107   12/29/14 2330  cefTAZidime (FORTAZ) 1 g in dextrose 5 % 50 mL IVPB     1 g 100 mL/hr over 30 Minutes Intravenous  Once 12/29/14 2314 12/30/14 0213      Assessment: Okay for Protocol, initial doses given last PM.  Acute kidney injury in patient with baseline renal dysfunction.  Elevated WBC and lactic acid.  Goal of Therapy:  Vancomycin trough level 15-20 mcg/ml  Plan:  Ceftazidime 1gm IV every 24 hours. Vancomycin 1gm IV every 48 hours. Measure antibiotic drug levels at steady state Follow up culture results  Mady GemmaHayes, Carrieann Spielberg R 12/30/2014,8:02 AM

## 2014-12-30 NOTE — Progress Notes (Signed)
Present with patient for a brief visit.  

## 2014-12-31 DIAGNOSIS — N390 Urinary tract infection, site not specified: Secondary | ICD-10-CM

## 2014-12-31 DIAGNOSIS — R627 Adult failure to thrive: Secondary | ICD-10-CM

## 2014-12-31 DIAGNOSIS — Z515 Encounter for palliative care: Secondary | ICD-10-CM

## 2014-12-31 LAB — COMPREHENSIVE METABOLIC PANEL
ALT: 26 U/L (ref 14–54)
ANION GAP: 11 (ref 5–15)
AST: 39 U/L (ref 15–41)
Albumin: 1.6 g/dL — ABNORMAL LOW (ref 3.5–5.0)
Alkaline Phosphatase: 77 U/L (ref 38–126)
BILIRUBIN TOTAL: 0.5 mg/dL (ref 0.3–1.2)
BUN: 94 mg/dL — ABNORMAL HIGH (ref 6–20)
CHLORIDE: 111 mmol/L (ref 101–111)
CO2: 13 mmol/L — AB (ref 22–32)
Calcium: 8.3 mg/dL — ABNORMAL LOW (ref 8.9–10.3)
Creatinine, Ser: 2.31 mg/dL — ABNORMAL HIGH (ref 0.44–1.00)
GFR calc Af Amer: 22 mL/min — ABNORMAL LOW (ref >60)
GFR calc non Af Amer: 19 mL/min — ABNORMAL LOW (ref >60)
Glucose, Bld: 129 mg/dL — ABNORMAL HIGH (ref 65–99)
POTASSIUM: 3.5 mmol/L (ref 3.5–5.1)
SODIUM: 135 mmol/L (ref 135–145)
TOTAL PROTEIN: 4.7 g/dL — AB (ref 6.5–8.1)

## 2014-12-31 LAB — GLUCOSE, CAPILLARY
GLUCOSE-CAPILLARY: 96 mg/dL (ref 65–99)
Glucose-Capillary: 114 mg/dL — ABNORMAL HIGH (ref 65–99)
Glucose-Capillary: 203 mg/dL — ABNORMAL HIGH (ref 65–99)
Glucose-Capillary: 138 mg/dL — ABNORMAL HIGH (ref 65–99)

## 2014-12-31 LAB — CBC
HEMATOCRIT: 22.6 % — AB (ref 36.0–46.0)
Hemoglobin: 7.5 g/dL — ABNORMAL LOW (ref 12.0–15.0)
MCH: 30.5 pg (ref 26.0–34.0)
MCHC: 33.2 g/dL (ref 30.0–36.0)
MCV: 91.9 fL (ref 78.0–100.0)
PLATELETS: 299 10*3/uL (ref 150–400)
RBC: 2.46 MIL/uL — ABNORMAL LOW (ref 3.87–5.11)
RDW: 18.5 % — ABNORMAL HIGH (ref 11.5–15.5)
WBC: 21.3 10*3/uL — AB (ref 4.0–10.5)

## 2014-12-31 LAB — OCCULT BLOOD X 1 CARD TO LAB, STOOL: Fecal Occult Bld: POSITIVE — AB

## 2014-12-31 LAB — URINE CULTURE

## 2014-12-31 LAB — CLOSTRIDIUM DIFFICILE BY PCR: CDIFFPCR: NEGATIVE

## 2014-12-31 LAB — HEMOGLOBIN A1C
HEMOGLOBIN A1C: 7.6 % — AB (ref 4.8–5.6)
Mean Plasma Glucose: 171 mg/dL

## 2014-12-31 MED ORDER — LEVOFLOXACIN IN D5W 500 MG/100ML IV SOLN
500.0000 mg | INTRAVENOUS | Status: DC
  Administered 2015-01-02: 500 mg via INTRAVENOUS
  Filled 2014-12-31: qty 100

## 2014-12-31 MED ORDER — FAMOTIDINE IN NACL 20-0.9 MG/50ML-% IV SOLN
20.0000 mg | INTRAVENOUS | Status: DC
  Administered 2014-12-31 – 2015-01-06 (×7): 20 mg via INTRAVENOUS
  Filled 2014-12-31 (×9): qty 50

## 2014-12-31 MED ORDER — TRACE MINERALS CR-CU-MN-SE-ZN 10-1000-500-60 MCG/ML IV SOLN
INTRAVENOUS | Status: AC
  Administered 2014-12-31 – 2015-01-01 (×2): via INTRAVENOUS
  Filled 2014-12-31: qty 1200

## 2014-12-31 MED ORDER — SODIUM CHLORIDE 0.9 % IV SOLN
INTRAVENOUS | Status: DC
  Administered 2014-12-31 – 2015-01-02 (×3): via INTRAVENOUS

## 2014-12-31 MED ORDER — INSULIN ASPART 100 UNIT/ML ~~LOC~~ SOLN
0.0000 [IU] | SUBCUTANEOUS | Status: DC
  Administered 2014-12-31: 3 [IU] via SUBCUTANEOUS
  Administered 2014-12-31 – 2015-01-01 (×2): 1 [IU] via SUBCUTANEOUS
  Administered 2015-01-01: 2 [IU] via SUBCUTANEOUS
  Administered 2015-01-01: 1 [IU] via SUBCUTANEOUS

## 2014-12-31 MED ORDER — LEVOFLOXACIN IN D5W 750 MG/150ML IV SOLN
750.0000 mg | Freq: Once | INTRAVENOUS | Status: AC
  Administered 2014-12-31: 750 mg via INTRAVENOUS

## 2014-12-31 MED ORDER — TRACE MINERALS CR-CU-MN-SE-ZN 10-1000-500-60 MCG/ML IV SOLN: INTRAVENOUS | Status: DC

## 2014-12-31 MED ORDER — ACETAMINOPHEN 325 MG PO TABS
650.0000 mg | ORAL_TABLET | Freq: Four times a day (QID) | ORAL | Status: DC | PRN
  Administered 2014-12-31 (×2): 650 mg via ORAL
  Filled 2014-12-31 (×2): qty 2

## 2014-12-31 NOTE — Care Management Note (Signed)
Case Management Note  Patient Details  Name: [name] Gerads MRN: [id number] Date of Birth: 09/27/1930  Expected Discharge Date:                  Expected Discharge Plan:  Skilled Nursing Facility  In-House Referral:  Clinical Social Work  Discharge planning Services  CM Consult  Post Acute Care Choice:  NA Choice offered to:  NA  DME Arranged:    DME Agency:     HH Arranged:    HH Agency:     Status of Service:  In process, will continue to follow  Medicare Important Message Given:    Date Medicare IM Given:    Medicare IM give by:    Date Additional Medicare IM Given:    Additional Medicare Important Message give by:     If discussed at Long Length of Stay Meetings, dates discussed:    Additional Comments: Pt is from [facility]. Pt admitted for HCAP. Pt plans to return to SNF at DC. MD has made palliative care consult. Plan for return to SNF at DC. Palliative care consult has been made. No CM needs anticipated.  Malcolm Metrohildress, Gabriana Wilmott Demske, RN 12/31/2014, 8:49 AM

## 2014-12-31 NOTE — Progress Notes (Signed)
PT HAS BEEN COMPLAINING OF LEFT LOWER LEG PAIN. DR [name] CALLED AND ORDER RECEIVED FOR PRN PO TYLENOL WHICH PT DID TAKE ORALLY FOR HER PAIN W/ WATER.

## 2014-12-31 NOTE — Progress Notes (Signed)
PARENTERAL NUTRITION CONSULT NOTE - INITIAL  Pharmacy Consult for TPN Indication: intolerance to enteral feeding / protein - calorie malnutrition  No Known Allergies  Patient Measurements: Height: 5' 3.5" (161.3 cm) Weight: 199 lb 15.3 oz (90.7 kg) IBW/kg (Calculated) : 53.55 Adjusted Body Weight: 73.2Kg  Vital Signs: Temp: 97.4 F (36.3 C) (07/06 1219) Temp Source: Oral (07/06 1219) BP: 122/55 mmHg (07/06 1200) Pulse Rate: 94 (07/06 1200) Intake/Output from previous day: 07/05 0701 - 07/06 0700 In: 2470.4 [P.O.:480; I.V.:1940.4; IV Piggyback:50] Out: 775 [Urine:775] Intake/Output from this shift: Total I/O In: 710 [P.O.:60; I.V.:450; IV Piggyback:200] Out: 350 [Urine:350]  Labs:  Recent Labs  12/29/14 2158 12/30/14 0520 12/31/14 0440  WBC 35.4* 32.3* 21.3*  HGB 11.7* 9.9* 7.5*  HCT 36.3 30.6* 22.6*  PLT 368 339 299    Recent Labs  12/29/14 2158 12/30/14 0520 12/31/14 0440  NA 131* 134* 135  K 4.7 4.2 3.5  CL 104 108 111  CO2 8* 12* 13*  GLUCOSE 345* 204* 129*  BUN 78* 79* 94*  CREATININE 2.57* 2.41* 2.31*  CALCIUM 9.3 8.6* 8.3*  PROT 6.4* 5.6* 4.7*  ALBUMIN 2.3* 2.0* 1.6*  AST 45* 62* 39  ALT 21 28 26   ALKPHOS 113 91 77  BILITOT 0.5 0.4 0.5   Estimated Creatinine Clearance: 20.6 mL/min (by C-G formula based on Cr of 2.31).    Recent Labs  12/30/14 2116 12/31/14 0806 12/31/14 1135  GLUCAP 174* 114* 203*   Medical History: Past Medical History  Diagnosis Date  . Diabetes mellitus   . Hypertension   . Osteopenia   . Peripheral vascular disease, unspecified   . GERD (gastroesophageal reflux disease)   . Arthritis   . Shortness of breath     exersion  . Pneumonia     child  . Anemia     hx  . Fall at home October 09, 2013  . DM (diabetes mellitus) type II uncontrolled, periph vascular disorder 09/17/2014  . Hepatitis     "a"  . CHF (congestive heart failure) 11/2014    EF 35-40%   Insulin Requirements in the past 24 hours:  9  UNITS  Current Nutrition:  none  Assessment: 81yo obese female with ARF.  BMI ~ 35.  Pt admitted with possible pneumonia, sepsis, metabolic/lactic acidosis and low albumin.  Pt is essentially bed bound with failure to thrive.  Estimated Creatinine Clearance: 20.6 mL/min (by C-G formula based on Cr of 2.31).  Nutritional Goals:  742 kCal, 108 grams of protein per day (per IBW)  GOAL:  Meet ~ 80% of calorie needs and 100% of protein needs during 1st week of ICU stay (based on Ideal Body Weight).  F/U recommendations from clinical dietician.  Plan:  Begin Clinimix E 5/15 today at 6pm at 4250ml/hr Adjust NS IVF to 4525ml/hr when TPN hung Titrate TPN rate slowly to goal of 3090ml/hr if able to tolerate. (pt w/ ARF) Continue Sliding Scale Insulin q4hrs Monitor labs, renal fxn, electrolytes, fluid status, and glucose tolerance Defer starting lipids for now.  Margo AyeHall, Marsa Matteo A 12/31/2014,1:11 PM

## 2014-12-31 NOTE — Progress Notes (Signed)
Subjective: ARF: improving slowly Hcap:  Pt is on vanco, ceftazidime D#2 wbc improving but might be dilutional pox stable Metabolic acidosis improving FEN: po intake poor DM2:  bs stable Anemia: no brbpr per staff.  Delirium:  Pt has attempted to remove dressing to central line,  She has been placed in mittens due to history of pulling out picc lines in the past.   Objective: Vital signs in last 24 hours: Temp:  [97.5 F (36.4 C)-98.4 F (36.9 C)] 98.4 F (36.9 C) (07/06 0400) Pulse Rate:  [78-116] 78 (07/06 0600) Resp:  [16-34] 23 (07/06 0600) BP: (108-147)/(35-107) 147/48 mmHg (07/06 0600) SpO2:  [91 %-98 %] 97 % (07/06 0600) Weight:  [90.7 kg (199 lb 15.3 oz)] 90.7 kg (199 lb 15.3 oz) (07/06 0500) Weight change: -4.555 kg (-10 lb 0.7 oz) Last BM Date: 12/29/14  Intake/Output from previous day: 07/05 0701 - 07/06 0700 In: 2470.4 [P.O.:480; I.V.:1940.4; IV Piggyback:50] Out: 775 [Urine:775] Intake/Output this shift:    Heent: anicteric Neck: no jvd Heart: rrr s1, s2 Lung: crackles at bilateral bases, no wheezing Abd: soft, obese, nt, +bs Ext: trace edema Skin: slight ulcer , see rn notes for details  Lab Results:  Recent Labs  12/30/14 0520 12/31/14 0440  WBC 32.3* 21.3*  HGB 9.9* 7.5*  HCT 30.6* 22.6*  PLT 339 299   BMET  Recent Labs  12/30/14 0520 12/31/14 0440  NA 134* 135  K 4.2 3.5  CL 108 111  CO2 12* 13*  GLUCOSE 204* 129*  BUN 79* 94*  CREATININE 2.41* 2.31*  CALCIUM 8.6* 8.3*    Studies/Results: Dg Chest Port 1 View  12/29/2014   CLINICAL DATA:  Acute onset of vomiting, with severe fatigue and generalized weakness. Initial encounter.  EXAM: PORTABLE CHEST - 1 VIEW  COMPARISON:  Chest radiograph performed 11/30/2014  FINDINGS: The lungs are hypoexpanded. Mild left basilar airspace opacity could reflect mild pneumonia, given the patient's symptoms. No pleural effusion or pneumothorax is seen.  The cardiomediastinal silhouette is borderline  normal in size. A right-sided tunneled catheter is noted ending about the mid SVC. No acute osseous abnormalities are identified.  IMPRESSION: Lungs hypoexpanded. Mild left basilar opacity could reflect mild pneumonia, given the patient's symptoms.   Electronically Signed   By: Roanna RaiderJeffery  Chang M.D.   On: 12/29/2014 22:01    Medications: I have reviewed the patient's current medications.  Assessment/Plan: Hcap:  Blood cultures pending, no growth so far Add levaquin per pharmacy consultation Cont vanco, cont ceftazidime D#2  UTI Awaiting culture results Cont current abx  ARF Improving Holding lasix  CHF EF(35- 40%) Not in chf clinically  Metabolic acidosis Improving  Dm2 Cont fsbs , change to q4h Sensitive sliding scale  Pressure ulcer Wound care consult  FEN TPN temporarily since she has such severe protein calorie malnutrition and this will also improve her wound healing, no peg due to fact that she would be at risk of pulling out peg  Delirium due to infection appears to be improving  Code Status: FULL CODE  DVT prophylaxis: lovenox   LOS: 2 days   Pearson GrippeKIM, Riely Oetken 12/31/2014, 7:03 AM

## 2014-12-31 NOTE — Clinical Social Work Note (Addendum)
Clinical Social Work Assessment  Patient Details  Name: Tina [name] MRN: [id number] Date of Birth: 01/07/1931  Date of referral:  12/31/14               Reason for consult:  Facility Placement                Permission sought to share information with:    Permission granted to share information::     Name::        Agency::     Relationship::     Contact Information:     Housing/Transportation Living arrangements for the past 2 months:  [facility] Source of Information:  Facility Patient Interpreter Needed:  None Criminal Activity/Legal Involvement Pertinent to Current Situation/Hospitalization:  No - Comment as needed Significant Relationships:  Adult Children Lives with:  Facility Resident Do you feel safe going back to the place where you live?  Yes Need for family participation in patient care:  Yes (Comment)  Care giving concerns:  Facility Resident   Social Worker assessment / plan:  Due to patient's confustion, CSW did not assess patient.  CSW spoke with Eunice Blaseebbie at [facility].  Debbie advised that patient has been in the facility since 3/16. She indicated that staff assists patient in getting in the wheelchair, however patient will not remain in the wheelchair long before she ask to get back in bed.  She stated that patient's daughter visits with her frequently.  She indicated that patient can be unkind to facility staff at times.  She advised that facility staff assist patient with ADLs.  Eunice BlaseDebbie indicted that patient can return to the facility upon discharge.  CSW spoke with patient's daughter, [name], who confirmed Debbie's statements and advised that she desired for patient to return to [facility].  Employment status:  Retired Health and safety inspectornsurance information:  Medicare PT Recommendations:  Not assessed at this time Information / Referral to community resources:     Patient/Family's Response to care:  Facility agreeable to patient's return at discharge.   Patient/Family's  Understanding of and Emotional Response to Diagnosis, Current Treatment, and Prognosis:  Patient's daughter reports that patient will need to be in a SNF due to her current diagnosis, treatment and prognosis.   Emotional Assessment Appearance:  Developmentally appropriate Attitude/Demeanor/Rapport:  Unable to Assess Affect (typically observed):  Unable to Assess Orientation:    Alcohol / Substance use:  Not Applicable Psych involvement (Current and /or in the community):  No (Comment)  Discharge Needs  Concerns to be addressed:  Discharge Planning Concerns Readmission within the last 30 days:  No Current discharge risk:  None Barriers to Discharge:  No Barriers Identified   Annice NeedySettle, Keshawna Dix D, LCSW 12/31/2014, 3:42 PM [phone]

## 2014-12-31 NOTE — Progress Notes (Signed)
ANTIBIOTIC CONSULT NOTE  Pharmacy Consult for Vancomycin / Ceftazidime / Levaquin Indication: pneumonia / UTI / Sepsis  No Known Allergies  Patient Measurements: Height: 5' 3.5" (161.3 cm) Weight: 199 lb 15.3 oz (90.7 kg) IBW/kg (Calculated) : 53.55 Vital Signs: Temp: 97.4 F (36.3 C) (07/06 1219) Temp Source: Oral (07/06 1219) BP: 122/55 mmHg (07/06 1200) Pulse Rate: 94 (07/06 1200) Intake/Output from previous day: 07/05 0701 - 07/06 0700 In: 2470.4 [P.O.:480; I.V.:1940.4; IV Piggyback:50] Out: 775 [Urine:775] Intake/Output from this shift: Total I/O In: 710 [P.O.:60; I.V.:450; IV Piggyback:200] Out: 350 [Urine:350]  Labs:  Recent Labs  12/29/14 2158 12/30/14 0520 12/31/14 0440  WBC 35.4* 32.3* 21.3*  HGB 11.7* 9.9* 7.5*  PLT 368 339 299  CREATININE 2.57* 2.41* 2.31*   Estimated Creatinine Clearance: 20.6 mL/min (by C-G formula based on Cr of 2.31). No results for input(s): VANCOTROUGH, VANCOPEAK, VANCORANDOM, GENTTROUGH, GENTPEAK, GENTRANDOM, TOBRATROUGH, TOBRAPEAK, TOBRARND, AMIKACINPEAK, AMIKACINTROU, AMIKACIN in the last 72 hours.   Microbiology: Recent Results (from the past 720 hour(s))  Urine culture     Status: None   Collection Time: 12/29/14  9:35 PM  Result Value Ref Range Status   Specimen Description URINE, CATHETERIZED  Final   Special Requests NONE  Final   Culture   Final    MULTIPLE SPECIES PRESENT, SUGGEST RECOLLECTION IF CLINICALLY INDICATED [facility]    Report Status 12/31/2014 FINAL  Final  Blood culture (routine x 2)     Status: None (Preliminary result)   Collection Time: 12/29/14  9:58 PM  Result Value Ref Range Status   Specimen Description BLOOD PORT  Final   Special Requests   Final    BOTTLES DRAWN AEROBIC AND ANAEROBIC AEB 11CC ANA 6CC   Culture PENDING  Incomplete   Report Status PENDING  Incomplete  Blood culture (routine x 2)     Status: None (Preliminary result)   Collection Time: 12/30/14 12:00 AM   Result Value Ref Range Status   Specimen Description BLOOD PORT  Final   Special Requests BOTTLES DRAWN AEROBIC ONLY 8CC  Final   Culture PENDING  Incomplete   Report Status PENDING  Incomplete  MRSA PCR Screening     Status: None   Collection Time: 12/30/14 12:21 AM  Result Value Ref Range Status   MRSA by PCR NEGATIVE NEGATIVE Final    Comment:        The GeneXpert MRSA Assay (FDA approved for NASAL specimens only), is one component of a comprehensive MRSA colonization surveillance program. It is not intended to diagnose MRSA infection nor to guide or monitor treatment for MRSA infections.     Anti-infectives    Start     Dose/Rate Route Frequency Ordered Stop   01/02/15 1000  levofloxacin (LEVAQUIN) IVPB 500 mg     500 mg 100 mL/hr over 60 Minutes Intravenous Every 48 hours 12/31/14 1133     12/31/14 2100  vancomycin (VANCOCIN) IVPB 1000 mg/200 mL premix     1,000 mg 200 mL/hr over 60 Minutes Intravenous Every 48 hours 12/30/14 0812     12/31/14 0800  levofloxacin (LEVAQUIN) IVPB 750 mg     750 mg 100 mL/hr over 90 Minutes Intravenous  Once 12/31/14 0750 12/31/14 1134   12/30/14 2200  cefTAZidime (FORTAZ) 1 g in dextrose 5 % 50 mL IVPB     1 g 100 mL/hr over 30 Minutes Intravenous Every 24 hours 12/30/14 0812     12/29/14 2345  vancomycin (VANCOCIN)  IVPB 1000 mg/200 mL premix     1,000 mg 200 mL/hr over 60 Minutes Intravenous  Once 12/29/14 2333 12/30/14 0107   12/29/14 2330  cefTAZidime (FORTAZ) 1 g in dextrose 5 % 50 mL IVPB     1 g 100 mL/hr over 30 Minutes Intravenous  Once 12/29/14 2314 12/30/14 0213     Assessment: Okay for Protocol, initial doses given last PM.  Acute kidney injury in patient with baseline renal dysfunction.  Elevated WBC and lactic acid.  Goal of Therapy:  Vancomycin trough level 15-20 mcg/ml  Plan:  Ceftazidime 1gm IV every 24 hours. Vancomycin 1gm IV every 48 hours. Levaquin  IV x 1 then  IV q48hrs Measure antibiotic drug  levels at steady state Follow up culture results  Tina [name] A 12/31/2014,1:55 PM

## 2014-12-31 NOTE — Progress Notes (Signed)
PT'S DAUGHTER AND OTHER FAMILY MEMBER JUST ARRIVED TO ROOM.  STOOLS SPECIMENS SENT TO LAB  TO R/O OCCULT BLOOD AND C-DIFF

## 2014-12-31 NOTE — Clinical Documentation Improvement (Signed)
A cause and effect relationship may not be assumed and must be documented by a provider. Please clarify the relationship, if any, between Sepsis and foley catheter/UTI.  Are the conditions: ? Due to or associated with each other ? Other (please specify) ? Unrelated to each other ? Unable to determine ? Unknown    Contributing Factors: Sign & Symptoms:Genitourinary: +foley in place with thick cloudy yellow urine in tubing and bag.   Diagnostics:UTI & Sepsis   Thank You, Nevin BloodgoodJoan B Rona Tomson, RN, BSN, CCDS,Clinical Documentation Specialist:  [phone]  [phone]=Cell - Health Information Management

## 2014-12-31 NOTE — Plan of Care (Signed)
Problem: Phase I Progression Outcomes Goal: Initial discharge plan identified Outcome: Progressing PLAN IS FOR PT TO GO BACK TO SNF Goal: Voiding-avoid urinary catheter unless indicated Outcome: Not Applicable Date Met:  66/06/00 PT HAS A CHRONIC FOLEY CATH Goal: Other Phase I Outcomes/Goals Outcome: Not Progressing PT IS REFUSING TO EAT AND DRINK.

## 2014-12-31 NOTE — Consult Note (Signed)
WOC wound consult note Reason for Consult: sacral pressure ulcer Wound type: Stage II Pressure ulcer bilateral buttocks MASD (moisture associated skin damage) inner thighs and lower buttocks Pressure Ulcer POA: Yes Measurement: See flow sheet Wound bed: partial thickness skin ulcers on the upper buttocks, lower buttocks with moisture associated skin damage.  Drainage (amount, consistency, odor) scant Periwound: some blanchable redness, she has been having frequent diarrhea so her redness is a result of this Dressing procedure/placement/frequency: Silicone foam to protect the areas on the bilateral upper buttocks from further injury. Turn and reposition frequently.  She is on a SPORT bed while in ICU for pressure redistribution.  Use zinc based barrier cream to protect the inner thigh wounds from moisture and exposure to stool.  Discussed POC with bedside nurse.  Re consult if needed, will not follow at this time. Thanks  Divina Neale [employer] RN, CWOCN [phone])

## 2015-01-01 ENCOUNTER — Encounter (HOSPITAL_COMMUNITY): Payer: Self-pay | Admitting: Primary Care

## 2015-01-01 DIAGNOSIS — Z515 Encounter for palliative care: Secondary | ICD-10-CM

## 2015-01-01 DIAGNOSIS — N390 Urinary tract infection, site not specified: Secondary | ICD-10-CM | POA: Insufficient documentation

## 2015-01-01 LAB — CBC WITH DIFFERENTIAL/PLATELET
Basophils Absolute: 0 10*3/uL (ref 0.0–0.1)
Basophils Relative: 0 % (ref 0–1)
EOS ABS: 1.7 10*3/uL — AB (ref 0.0–0.7)
EOS PCT: 12 % — AB (ref 0–5)
HEMATOCRIT: 22.1 % — AB (ref 36.0–46.0)
Hemoglobin: 7.3 g/dL — ABNORMAL LOW (ref 12.0–15.0)
Lymphocytes Relative: 9 % — ABNORMAL LOW (ref 12–46)
Lymphs Abs: 1.2 10*3/uL (ref 0.7–4.0)
MCH: 30.7 pg (ref 26.0–34.0)
MCHC: 33 g/dL (ref 30.0–36.0)
MCV: 92.9 fL (ref 78.0–100.0)
MONO ABS: 0.7 10*3/uL (ref 0.1–1.0)
Monocytes Relative: 5 % (ref 3–12)
Neutro Abs: 10.6 10*3/uL — ABNORMAL HIGH (ref 1.7–7.7)
Neutrophils Relative %: 74 % (ref 43–77)
Platelets: 299 10*3/uL (ref 150–400)
RBC: 2.38 MIL/uL — ABNORMAL LOW (ref 3.87–5.11)
RDW: 18.6 % — AB (ref 11.5–15.5)
WBC: 14.2 10*3/uL — ABNORMAL HIGH (ref 4.0–10.5)

## 2015-01-01 LAB — COMPREHENSIVE METABOLIC PANEL
ALT: 28 U/L (ref 14–54)
AST: 31 U/L (ref 15–41)
Albumin: 1.7 g/dL — ABNORMAL LOW (ref 3.5–5.0)
Alkaline Phosphatase: 81 U/L (ref 38–126)
Anion gap: 9 (ref 5–15)
BUN: 79 mg/dL — AB (ref 6–20)
CALCIUM: 8.8 mg/dL — AB (ref 8.9–10.3)
CHLORIDE: 113 mmol/L — AB (ref 101–111)
CO2: 13 mmol/L — ABNORMAL LOW (ref 22–32)
CREATININE: 1.92 mg/dL — AB (ref 0.44–1.00)
GFR calc Af Amer: 27 mL/min — ABNORMAL LOW (ref >60)
GFR calc non Af Amer: 23 mL/min — ABNORMAL LOW (ref >60)
Glucose, Bld: 168 mg/dL — ABNORMAL HIGH (ref 65–99)
Potassium: 3.7 mmol/L (ref 3.5–5.1)
Sodium: 135 mmol/L (ref 135–145)
Total Bilirubin: 0.4 mg/dL (ref 0.3–1.2)
Total Protein: 4.9 g/dL — ABNORMAL LOW (ref 6.5–8.1)

## 2015-01-01 LAB — GLUCOSE, CAPILLARY
GLUCOSE-CAPILLARY: 192 mg/dL — AB (ref 65–99)
Glucose-Capillary: 135 mg/dL — ABNORMAL HIGH (ref 65–99)
Glucose-Capillary: 136 mg/dL — ABNORMAL HIGH (ref 65–99)
Glucose-Capillary: 159 mg/dL — ABNORMAL HIGH (ref 65–99)
Glucose-Capillary: 195 mg/dL — ABNORMAL HIGH (ref 65–99)

## 2015-01-01 LAB — PREALBUMIN: Prealbumin: 18.7 mg/dL (ref 18–38)

## 2015-01-01 LAB — TRIGLYCERIDES: TRIGLYCERIDES: 232 mg/dL — AB (ref <150)

## 2015-01-01 LAB — PHOSPHORUS: PHOSPHORUS: 4.4 mg/dL (ref 2.5–4.6)

## 2015-01-01 LAB — VITAMIN B12: Vitamin B-12: 840 pg/mL (ref 180–914)

## 2015-01-01 LAB — FERRITIN: FERRITIN: 126 ng/mL (ref 11–307)

## 2015-01-01 LAB — MAGNESIUM: Magnesium: 1.6 mg/dL — ABNORMAL LOW (ref 1.7–2.4)

## 2015-01-01 LAB — PREPARE RBC (CROSSMATCH)

## 2015-01-01 MED ORDER — CARVEDILOL 3.125 MG PO TABS
3.1250 mg | ORAL_TABLET | Freq: Two times a day (BID) | ORAL | Status: DC
  Administered 2015-01-01 – 2015-01-02 (×4): 3.125 mg via ORAL
  Filled 2015-01-01 (×4): qty 1

## 2015-01-01 MED ORDER — HYDRALAZINE HCL 20 MG/ML IJ SOLN
5.0000 mg | Freq: Four times a day (QID) | INTRAMUSCULAR | Status: DC | PRN
Start: 2015-01-01 — End: 2015-01-07
  Administered 2015-01-01: 5 mg via INTRAVENOUS
  Filled 2015-01-01: qty 1

## 2015-01-01 MED ORDER — DIPHENHYDRAMINE HCL 25 MG PO CAPS
25.0000 mg | ORAL_CAPSULE | Freq: Once | ORAL | Status: AC
  Administered 2015-01-01: 25 mg via ORAL
  Filled 2015-01-01: qty 1

## 2015-01-01 MED ORDER — SERTRALINE HCL 50 MG PO TABS
50.0000 mg | ORAL_TABLET | Freq: Every day | ORAL | Status: DC
  Administered 2015-01-01 – 2015-01-03 (×3): 50 mg via ORAL
  Filled 2015-01-01 (×4): qty 1

## 2015-01-01 MED ORDER — SODIUM CHLORIDE 0.9 % IV SOLN
Freq: Once | INTRAVENOUS | Status: AC
  Administered 2015-01-01: 15:00:00 via INTRAVENOUS

## 2015-01-01 MED ORDER — M.V.I. ADULT IV INJ
INJECTION | INTRAVENOUS | Status: AC
  Administered 2015-01-01: 18:00:00 via INTRAVENOUS
  Filled 2015-01-01: qty 1560

## 2015-01-01 MED ORDER — INSULIN ASPART 100 UNIT/ML ~~LOC~~ SOLN
0.0000 [IU] | Freq: Four times a day (QID) | SUBCUTANEOUS | Status: DC
Start: 2015-01-01 — End: 2015-01-06
  Administered 2015-01-01 (×2): 2 [IU] via SUBCUTANEOUS
  Administered 2015-01-02: 5 [IU] via SUBCUTANEOUS
  Administered 2015-01-02: 3 [IU] via SUBCUTANEOUS
  Administered 2015-01-02: 5 [IU] via SUBCUTANEOUS
  Administered 2015-01-02: 2 [IU] via SUBCUTANEOUS
  Administered 2015-01-03: 5 [IU] via SUBCUTANEOUS
  Administered 2015-01-03: 3 [IU] via SUBCUTANEOUS
  Administered 2015-01-03: 5 [IU] via SUBCUTANEOUS
  Administered 2015-01-03: 3 [IU] via SUBCUTANEOUS
  Administered 2015-01-04: 5 [IU] via SUBCUTANEOUS
  Administered 2015-01-04: 3 [IU] via SUBCUTANEOUS
  Administered 2015-01-04: 5 [IU] via SUBCUTANEOUS
  Administered 2015-01-04: 2 [IU] via SUBCUTANEOUS
  Administered 2015-01-05 (×2): 3 [IU] via SUBCUTANEOUS
  Administered 2015-01-05 (×2): 2 [IU] via SUBCUTANEOUS
  Administered 2015-01-06 (×3): 3 [IU] via SUBCUTANEOUS

## 2015-01-01 MED ORDER — MAGNESIUM SULFATE 2 GM/50ML IV SOLN
2.0000 g | Freq: Once | INTRAVENOUS | Status: AC
  Administered 2015-01-01: 2 g via INTRAVENOUS
  Filled 2015-01-01: qty 50

## 2015-01-01 MED ORDER — VANCOMYCIN HCL IN DEXTROSE 750-5 MG/150ML-% IV SOLN
750.0000 mg | INTRAVENOUS | Status: DC
  Administered 2015-01-01: 750 mg via INTRAVENOUS
  Filled 2015-01-01 (×4): qty 150

## 2015-01-01 MED ORDER — ACETAMINOPHEN 325 MG PO TABS
650.0000 mg | ORAL_TABLET | Freq: Once | ORAL | Status: AC
  Administered 2015-01-01: 650 mg via ORAL
  Filled 2015-01-01: qty 2

## 2015-01-01 NOTE — Consult Note (Signed)
Consultation Note Date: 01/01/2015   Patient Name: Tina [name]  DOB: Jul 07, 1930  MRN: [id number]  Age / Sex: 81 y.o., female   PCP: Pearson Grippe, MD Referring Physician: Pearson Grippe, MD  Reason for Consultation: Establishing goals of care and Psychosocial/spiritual support  Palliative Care Assessment and Plan Summary of Established Goals of Care and Medical Treatment Preferences   Clinical Assessment/Narrative: Tina [name] is sitting up in her bed today.  She will often call out for people passing by to come in her room to talk to her/do things for her.  She talks with me today about what seems to be patient care tasks, and "how the next case will turn out".  She is a former Warden/ranger.  I reassure her that her patients are being cared for.  I ask her helps take care of her and she states "the man at the office."  We have a limited discussion about her health concerns today, and she does agree that she feels safe.   7/7 Call to Ms Storti's daughter, [name], who states she has limited time to talk today, and she will be unable to meet until evening hours tomorrow.  She states that Tina [name]'s decline started in February 2016.  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  We talk briefly about [name]'s illness and current condition.  We begin to talk about code status and [name] states that she is a Engineer, civil (consulting) and has run her own codes.  She talks about her brothers who will come in 7/13 and 14 and they have limited understanding of medical interventions and where their mother is with her health.   [name] states "she wouldn't want to live like that", but then states she was there Tuesday and her mother was very lucid and stated that she wanted to live.  Other times she says "let me go".  I advise [name] that PMT will continue to follow Tina [name] while she is in the hospital.    Contacts/Participants in Discussion: Primary  Decision Maker: [name] is unable to make informed decisions regarding her health care.  She has daughter [name] and brother Vylette Strubel listed as contacts.  [name] states she has two brothers who are expected to come in town 7/13 and 23.   HCPOA: Unsure, none listed  Code Status/Advance Care Planning:  FULL code at this time. Spoke with daughter, [name], who states they have decided to keep Tina [name] as a full code for now.  She states she needs to talk with her brothers more about these choices.   Symptom Management:   Pain: Tylenol 650 mg PO Q 6 hours PRN  N/V: Zofran 4 mg PO/IV Q 6 hours PRN  Palliative Prophylaxis: Recommend Senna S 1-2 tabs QHS  Psycho-social/Spiritual:   Support System: [name] is a resident at [facility].  Her daughter helps with her care. Her sons do not live in town.   Prognosis: Unable to determine  Discharge Planning:  Skilled Nursing Facility for rehab with Palliative care service follow-up       Chief Complaint:  Hypotension History of Present Illness: 81 year old female who  has a past medical history of Diabetes mellitus; Hypertension; Osteopenia; Peripheral vascular disease, unspecified; GERD (gastroesophageal reflux disease); Arthritis; Shortness of breath; Pneumonia; Anemia; Fall at home (October 09, 2013); DM (diabetes mellitus) type II uncontrolled, periph vascular disorder (09/17/2014); Hepatitis; and CHF (congestive heart failure) (11/2014).  Today patient was brought to the ED from [facility] skilled facility after patient was found to be hypotensive and lethargic. As per nursing staff at the skilled facility patient's SBP was in 70s. Patient had one episode of vomiting en route to the ED. Patient has history of dementia and denies any complaints. She denies chest pain, no shortness of breath no abdominal pain no nausea vomiting or diarrhea. Patient knows that she is at [facility]. In the ED patient was found to be in acute kidney  injury with elevated lactic acid 6.0. Patient responded to the IV fluids and her blood pressure has not improved. Patient does have a history of metabolic acidosis and was getting sodium bicarbonate tablets 3 times a day. Patient was discharged on Lasix 40 mg by mouth twice a day on 12/09/2014.   Today's chest x-ray revealed pneumonia. And patient is now being admitted for healthcare associated pneumonia. She has since needed PRBC and has been receiving TPN.    Primary Diagnoses  Present on Admission:  . Leukocytosis . HCAP (healthcare-associated pneumonia) . AKI (acute kidney injury) . FTT (failure to thrive) in adult . Chronic systolic CHF (congestive heart failure) . Dehydration . Lactic acidosis . Sepsis . CKD (chronic kidney disease) stage 3, GFR 30-59 ml/min . Pressure ulcer . UTI (lower urinary tract infection)  Palliative Review of Systems: Pain:  Denies Dyspnea:  Denies Anxiety:  Denies N/V:  Denies    I have reviewed the medical record, interviewed the patient and family, and examined the patient. The following aspects are pertinent.  Past Medical History  Diagnosis Date  . Diabetes mellitus   . Hypertension   . Osteopenia   . Peripheral vascular disease, unspecified   . GERD (gastroesophageal reflux disease)   . Arthritis   . Shortness of breath     exersion  . Pneumonia     child  . Anemia     hx  . Fall at home October 09, 2013  . DM (diabetes mellitus) type II uncontrolled, periph vascular disorder 09/17/2014  . Hepatitis     "a"  . CHF (congestive heart failure) 11/2014    EF 35-40%   History   Social History  . Marital Status: Widowed    Spouse Name: N/A  . Number of Children: N/A  . Years of Education: N/A   Social History Main Topics  . Smoking status: Former Smoker -- 0.30 packs/day for 1 years    Types: Cigarettes    Quit date: 01/06/1961  . Smokeless tobacco: Never Used  . Alcohol Use: No  . Drug Use: No  . Sexual Activity: No   Other  Topics Concern  . None   Social History Narrative   Family History  Problem Relation Age of Onset  . Cancer Mother   . Hypertension Mother   . Heart disease Father     before age 93  . Heart attack Father   . Hyperlipidemia Son    Scheduled Meds: . sodium chloride   Intravenous Once  . carvedilol  3.125 mg Oral BID WC  . cefTAZidime (FORTAZ)  IV  1 g Intravenous Q24H  . enoxaparin (LOVENOX) injection  30 mg Subcutaneous Q24H  . famotidine (PEPCID) IV  20 mg Intravenous Q24H  . insulin aspart  0-9 Units Subcutaneous Q6H  . [START ON 01/02/2015] levofloxacin (LEVAQUIN) IV  500 mg Intravenous Q48H  . sertraline  50 mg Oral Daily  . sodium bicarbonate  650 mg Oral TID  .  vancomycin  750 mg Intravenous Q24H   Continuous Infusions: . Marland Kitchen.TPN (CLINIMIX-E) Adult 50 mL/hr at 01/01/15 1000  . Marland Kitchen.TPN (CLINIMIX-E) Adult    . sodium chloride 10 mL/hr at 01/01/15 1308   PRN Meds:.acetaminophen, camphor-menthol, hydrALAZINE, ondansetron **OR** ondansetron (ZOFRAN) IV Medications Prior to Admission:  Prior to Admission medications   Medication Sig Start Date End Date Taking? Authorizing Provider  acetaminophen (TYLENOL) 650 MG CR tablet Take 650 mg by mouth every 6 (six) hours as needed for pain.   Yes Historical Provider, MD  albuterol (PROVENTIL) (2.5 MG/3ML) 0.083% nebulizer solution Take 3 mLs (2.5 mg total) by nebulization every 4 (four) hours as needed for wheezing. 12/10/14  Yes Pearson GrippeJames Kim, MD  Amino Acids-Protein Hydrolys (FEEDING SUPPLEMENT, PRO-STAT SUGAR FREE 64,) LIQD Take 30 mLs by mouth 3 (three) times daily with meals.    Yes Historical Provider, MD  atorvastatin (LIPITOR) 20 MG tablet Take 20 mg by mouth daily.   Yes Historical Provider, MD  camphor-menthol Wynelle Fanny(SARNA) lotion Apply topically as needed for itching. 12/10/14  Yes Pearson GrippeJames Kim, MD  cholecalciferol (VITAMIN D) 1000 UNITS tablet Take 1,000 Units by mouth daily.   Yes Historical Provider, MD  ezetimibe (ZETIA) 10 MG tablet Take 10  mg by mouth daily at 6 PM.    Yes Historical Provider, MD  feeding supplement, GLUCERNA SHAKE, (GLUCERNA SHAKE) LIQD Take 237 mLs by mouth 3 (three) times daily between meals. 12/10/14  Yes Pearson GrippeJames Kim, MD  furosemide (LASIX) 10 MG/ML injection Inject 4 mLs (40 mg total) into the vein once. Patient taking differently: Inject 40 mg into the vein daily.  12/10/14  Yes Pearson GrippeJames Kim, MD  guaiFENesin (MUCINEX) 600 MG 12 hr tablet Take 600 mg by mouth 2 (two) times daily.    Yes Historical Provider, MD  hydrALAZINE (APRESOLINE) 10 MG tablet Take 1 tablet (10 mg total) by mouth every 8 (eight) hours. 12/10/14  Yes Pearson GrippeJames Kim, MD  insulin aspart (NOVOLOG) 100 UNIT/ML injection Inject 1-9 Units into the skin 3 (three) times daily before meals. Per sliding scale: CBG 121-150 1 unit, 151-200 2 units; 201-250 3 units; 251-300 5 units, 301-351 7 units, 351-400 9 units;>401 call MD   Yes Historical Provider, MD  LORazepam (ATIVAN) 0.5 MG tablet Take 1 tablet (0.5 mg total) by mouth every 6 (six) hours as needed for anxiety. 09/30/14  Yes Catarina Hartshornavid Tat, MD  Melatonin-Pyridoxine (MELATONEX PO) Take 3 mg by mouth at bedtime.   Yes Historical Provider, MD  Methylfol-Methylcob-Acetylcyst 6-2-600 MG TABS Take 1 tablet by mouth daily.   Yes Historical Provider, MD  metoprolol succinate (TOPROL-XL) 25 MG 24 hr tablet Take 0.5 tablets (12.5 mg total) by mouth daily. 12/10/14  Yes Pearson GrippeJames Kim, MD  omeprazole (PRILOSEC) 20 MG capsule Take 20 mg by mouth daily.   Yes Historical Provider, MD  ondansetron (ZOFRAN) 4 MG tablet Take 1 tablet (4 mg total) by mouth every 8 (eight) hours as needed for nausea or vomiting. 09/28/14  Yes Brittney Kelly, PA-C  piperacillin-tazobactam (ZOSYN) 3.375 GM/50ML IVPB Inject 3.375 g into the vein every 6 (six) hours. Starting 12/28/2014 x 7 days for UTI   Yes Historical Provider, MD  polyvinyl alcohol (LIQUIFILM TEARS) 1.4 % ophthalmic solution Place 1 drop into both eyes 3 (three) times daily as needed for dry eyes.    Yes Historical Provider, MD  potassium chloride SA (K-DUR,KLOR-CON) 20 MEQ tablet Take 1 tablet (20 mEq total) by mouth daily. 12/10/14  Yes Pearson GrippeJames Kim, MD  senna-docusate (SENOKOT-S) 8.6-50 MG per tablet Take 1 tablet by mouth 2 (two) times daily. Hold for loose stool   Yes Historical Provider, MD  sodium bicarbonate 650 MG tablet Take 1 tablet (650 mg total) by mouth 3 (three) times daily. 12/10/14  Yes Pearson Grippe, MD  furosemide (LASIX) 40 MG tablet Take 1 tablet (40 mg total) by mouth 2 (two) times daily. Patient not taking: Reported on 12/29/2014 12/10/14   Pearson Grippe, MD   No Known Allergies CBC:    Component Value Date/Time   WBC 14.2* 01/01/2015 0510   HGB 7.3* 01/01/2015 0510   HCT 22.1* 01/01/2015 0510   HCT 24.7* 09/29/2014 0542   PLT 299 01/01/2015 0510   MCV 92.9 01/01/2015 0510   NEUTROABS 10.6* 01/01/2015 0510   LYMPHSABS 1.2 01/01/2015 0510   MONOABS 0.7 01/01/2015 0510   EOSABS 1.7* 01/01/2015 0510   BASOSABS 0.0 01/01/2015 0510   Comprehensive Metabolic Panel:    Component Value Date/Time   NA 135 01/01/2015 0510   K 3.7 01/01/2015 0510   CL 113* 01/01/2015 0510   CO2 13* 01/01/2015 0510   BUN 79* 01/01/2015 0510   CREATININE 1.92* 01/01/2015 0510   GLUCOSE 168* 01/01/2015 0510   CALCIUM 8.8* 01/01/2015 0510   AST 31 01/01/2015 0510   ALT 28 01/01/2015 0510   ALKPHOS 81 01/01/2015 0510   BILITOT 0.4 01/01/2015 0510   PROT 4.9* 01/01/2015 0510   ALBUMIN 1.7* 01/01/2015 0510    Physical Exam: Vital Signs: BP 134/51 mmHg  Pulse 74  Temp(Src) 98.7 F (37.1 C) (Oral)  Resp 23  Ht 5' 3.5" (1.613 m)  Wt 91 kg (200 lb 9.9 oz)  BMI 34.98 kg/m2  SpO2 96% SpO2: SpO2: 96 % O2 Device: O2 Device: Not Delivered O2 Flow Rate: O2 Flow Rate (L/min): 3 L/min Intake/output summary:  Intake/Output Summary (Last 24 hours) at 01/01/15 1456 Last data filed at 01/01/15 1218  Gross per 24 hour  Intake   2140 ml  Output   1450 ml  Net    690 ml   LBM: Last BM Date:  12/31/14 Baseline Weight: Weight: 95.255 kg (210 lb) Most recent weight: Weight: 91 kg (200 lb 9.9 oz)  Exam Findings:  Constitutional:  Ill appearing, frail, lying in bed, she makes and keeps eye contact, 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 Resp:  Even non labored breathing GI:  Abd soft, obese, non tender JX:[id number] indwelling foley Musc:  Muscle wasting to lower extremities.            Palliative Performance Scale: 30%              Additional Data Reviewed: Recent Labs     12/31/14  0440  01/01/15  0510  WBC  21.3*  14.2*  HGB  7.5*  7.3*  PLT  299  299  NA  135  135  BUN  94*  79*  CREATININE  2.31*  1.92*     Time In: 1355 Time Out: 1500 Time Total: 65 Greater than 50%  of this time was spent counseling and coordinating care related to the above assessment and plan. Discussion and plan of care shared with Nursing.   Signed by: Katheran Awe, NP  Katheran Awe, NP  01/01/2015, 2:56 PM  Please contact Palliative Medicine Team phone at [phone] for questions and concerns.

## 2015-01-01 NOTE — Progress Notes (Addendum)
PARENTERAL NUTRITION CONSULT NOTE - follow up  Pharmacy Consult for TPN Indication: intolerance to enteral feeding / protein - calorie malnutrition / wound healing  No Known Allergies  Patient Measurements: Height: 5' 3.5" (161.3 cm) Weight: 200 lb 9.9 oz (91 kg) IBW/kg (Calculated) : 53.55 Adjusted Body Weight: 73.2Kg  Vital Signs: Temp: 100.7 F (38.2 C) (07/07 0802) Temp Source: Oral (07/07 0802) BP: 160/63 mmHg (07/07 0815) Pulse Rate: 91 (07/07 0815) Intake/Output from previous day: 07/06 0701 - 07/07 0700 In: 2355 [P.O.:180; I.V.:1125; IV Piggyback:450; TPN:600] Out: 1200 [Urine:1200] Intake/Output from this shift:    Labs:  Recent Labs  12/30/14 0520 12/31/14 0440 01/01/15 0510  WBC 32.3* 21.3* 14.2*  HGB 9.9* 7.5* 7.3*  HCT 30.6* 22.6* 22.1*  PLT 339 299 299    Recent Labs  12/30/14 0520 12/31/14 0440 01/01/15 0510  NA 134* 135 135  K 4.2 3.5 3.7  CL 108 111 113*  CO2 12* 13* 13*  GLUCOSE 204* 129* 168*  BUN 79* 94* 79*  CREATININE 2.41* 2.31* 1.92*  CALCIUM 8.6* 8.3* 8.8*  MG  --   --  1.6*  PHOS  --   --  4.4  PROT 5.6* 4.7* 4.9*  ALBUMIN 2.0* 1.6* 1.7*  AST 62* 39 31  ALT 28 26 28   ALKPHOS 91 77 81  BILITOT 0.4 0.5 0.4  TRIG  --   --  232*   Estimated Creatinine Clearance: 24.9 mL/min (by C-G formula based on Cr of 1.92).    Recent Labs  01/01/15 0005 01/01/15 0413 01/01/15 0752  GLUCAP 136* 159* 135*   Medical History: Past Medical History  Diagnosis Date  . Diabetes mellitus   . Hypertension   . Osteopenia   . Peripheral vascular disease, unspecified   . GERD (gastroesophageal reflux disease)   . Arthritis   . Shortness of breath     exersion  . Pneumonia     child  . Anemia     hx  . Fall at home October 09, 2013  . DM (diabetes mellitus) type II uncontrolled, periph vascular disorder 09/17/2014  . Hepatitis     "a"  . CHF (congestive heart failure) 11/2014    EF 35-40%   Insulin Requirements in the past 24 hours:   5 UNITS  Current Nutrition:  Titrating TPN  Assessment: 81yo obese female with ARF.  BMI ~ 35.  Pt admitted with possible pneumonia, sepsis, metabolic/lactic acidosis and low albumin and decubitus ulcers to buttocks and legs.  Pt is essentially bed bound with failure to thrive.  Acute on chronic renal failure, SCr slightly improved.  Albumin low and trending down with most likely some dilutional component.  Prealbumin pending.  FOB (+), H/H trending down - consider d/c Lovenox and use mechanical VTE prophylaxis. Labs reviewed:  Magnesium slightly low.   Estimated Creatinine Clearance: 24.9 mL/min (by C-G formula based on Cr of 1.92).   I/O (+) 1155  Nutritional Goals:  742 kCal, 108 grams of protein per day (per IBW)  GOAL:  Meet ~ 80% of calorie needs and 100% of protein needs during 1st week of ICU stay (based on Ideal Body Weight).  F/U recommendations from clinical dietician.  Plan:  Magnesium 2gm IV bolus today x 1 Increase Clinimix E 5/15 to 9565ml/hr Adjust NS IVF to 6110ml/hr when TPN hung Titrate TPN rate slowly to goal of 6590ml/hr if able to tolerate. (pt w/ ARF) Continue Sliding Scale Insulin q6hrs - will not add insulin  to TPN at this point Monitor labs, renal fxn, electrolytes, fluid status, and glucose tolerance Defer starting lipids for now.  Margo Aye, Hannan Tetzlaff A 01/01/2015,8:27 AM

## 2015-01-01 NOTE — Progress Notes (Signed)
Subjective: ARF: improving slowly Hcap: Pt is on vanco, ceftazidime D#3 wbc improving but might be dilutional pox stable Metabolic acidosis improving FEN: po intake poor DM2: bs stable Anemia: no brbpr per staff.  Delirium: More alert today.  Pt has attempted to remove dressing to central line, She has been placed in mittens due to history of pulling out picc lines in the past.    Objective: Vital signs in last 24 hours: Temp:  [97.4 F (36.3 C)-98.2 F (36.8 C)] 97.6 F (36.4 C) (07/07 0400) Pulse Rate:  [77-97] 84 (07/07 0500) Resp:  [19-33] 22 (07/07 0500) BP: (111-160)/(43-102) 148/52 mmHg (07/07 0500) SpO2:  [88 %-100 %] 98 % (07/07 0500) Weight:  [91 kg (200 lb 9.9 oz)] 91 kg (200 lb 9.9 oz) (07/07 0500) Weight change: 0.3 kg (10.6 oz) Last BM Date: 12/31/14  Intake/Output from previous day: 07/06 0701 - 07/07 0700 In: 2355 [P.O.:180; I.V.:1125; IV Piggyback:450; TPN:600] Out: 1200 [Urine:1200] Intake/Output this shift:    Heent: anicteric Neck: no jvd Heart: rrr s1, s2,  Lung: slight crackles at base, no wheeze Abd: soft, obese,  Ext: no c/c/ trace edema  Lab Results:  Recent Labs  12/31/14 0440 01/01/15 0510  WBC 21.3* 14.2*  HGB 7.5* 7.3*  HCT 22.6* 22.1*  PLT 299 299   BMET  Recent Labs  12/31/14 0440 01/01/15 0510  NA 135 135  K 3.5 3.7  CL 111 113*  CO2 13* 13*  GLUCOSE 129* 168*  BUN 94* 79*  CREATININE 2.31* 1.92*  CALCIUM 8.3* 8.8*    Studies/Results: No results found.  Medications: I have reviewed the patient's current medications.  Assessment/Plan: Hcap:  Blood cultures pending, no growth so far Add levaquin per pharmacy consultation Cont vanco, cont ceftazidime D#3  Sepsis secondary to uti, hcap,  Cont current abx  UTI Cont current abx  ARF Improving Holding lasix  Anemia Type and screen, check b12, folate, ferritin Transfuse 2 units prbc  CHF EF(35- 40%) Not in chf clinically  Metabolic  acidosis Improving  Dm2 Cont fsbs , change to q4h Sensitive sliding scale  Hypertension Start on carvedilol 3.125mg  po bid  Depression  Start on on zoloft 50mg  po qday  Pressure ulcer Wound care consult  FEN TPN temporarily since she has such severe protein calorie malnutrition and this will also improve her wound healing, no peg due to fact that she would be at risk of pulling out peg  Delirium due to infection appears to be improving  Code Status: FULL CODE  DVT prophylaxis: lovenox  LOS: 3 days   Pearson GrippeKIM, Siddhi Dornbush 01/01/2015, 7:45 AM

## 2015-01-01 NOTE — Progress Notes (Signed)
ANTIBIOTIC CONSULT NOTE  Pharmacy Consult for Vancomycin / Ceftazidime / Levaquin Indication: pneumonia / UTI / Sepsis / decubitus ulcers  No Known Allergies  Patient Measurements: Height: 5' 3.5" (161.3 cm) Weight: 200 lb 9.9 oz (91 kg) IBW/kg (Calculated) : 53.55 Vital Signs: Temp: 100.7 F (38.2 C) (07/07 0802) Temp Source: Oral (07/07 0802) BP: 160/63 mmHg (07/07 0815) Pulse Rate: 91 (07/07 0815) Intake/Output from previous day: 07/06 0701 - 07/07 0700 In: 2355 [P.O.:180; I.V.:1125; IV Piggyback:450; TPN:600] Out: 1200 [Urine:1200] Intake/Output from this shift:    Labs:  Recent Labs  12/30/14 0520 12/31/14 0440 01/01/15 0510  WBC 32.3* 21.3* 14.2*  HGB 9.9* 7.5* 7.3*  PLT 339 299 299  CREATININE 2.41* 2.31* 1.92*   Estimated Creatinine Clearance: 24.9 mL/min (by C-G formula based on Cr of 1.92). No results for input(s): VANCOTROUGH, VANCOPEAK, VANCORANDOM, GENTTROUGH, GENTPEAK, GENTRANDOM, TOBRATROUGH, TOBRAPEAK, TOBRARND, AMIKACINPEAK, AMIKACINTROU, AMIKACIN in the last 72 hours.   Microbiology: Recent Results (from the past 720 hour(s))  Urine culture     Status: None   Collection Time: 12/29/14  9:35 PM  Result Value Ref Range Status   Specimen Description URINE, CATHETERIZED  Final   Special Requests NONE  Final   Culture   Final    MULTIPLE SPECIES PRESENT, SUGGEST RECOLLECTION IF CLINICALLY INDICATED Per[facility]    Report Status 12/31/2014 FINAL  Final  Blood culture (routine x 2)     Status: None (Preliminary result)   Collection Time: 12/29/14  9:58 PM  Result Value Ref Range Status   Specimen Description BLOOD PORT  Final   Special Requests   Final    BOTTLES DRAWN AEROBIC AND ANAEROBIC AEB 11CC ANA 6CC   Culture PENDING  Incomplete   Report Status PENDING  Incomplete  Blood culture (routine x 2)     Status: None (Preliminary result)   Collection Time: 12/30/14 12:00 AM  Result Value Ref Range Status   Specimen  Description BLOOD PORT  Final   Special Requests BOTTLES DRAWN AEROBIC ONLY 8CC  Final   Culture PENDING  Incomplete   Report Status PENDING  Incomplete  MRSA PCR Screening     Status: None   Collection Time: 12/30/14 12:21 AM  Result Value Ref Range Status   MRSA by PCR NEGATIVE NEGATIVE Final    Comment:        The GeneXpert MRSA Assay (FDA approved for NASAL specimens only), is one component of a comprehensive MRSA colonization surveillance program. It is not intended to diagnose MRSA infection nor to guide or monitor treatment for MRSA infections.   Clostridium Difficile by PCR (not at [facility])     Status: None   Collection Time: 12/31/14  4:55 PM  Result Value Ref Range Status   C difficile by pcr NEGATIVE NEGATIVE Final    Anti-infectives    Start     Dose/Rate Route Frequency Ordered Stop   01/02/15 1000  levofloxacin (LEVAQUIN) IVPB 500 mg     500 mg 100 mL/hr over 60 Minutes Intravenous Every 48 hours 12/31/14 1133     01/01/15 2100  vancomycin (VANCOCIN) IVPB 750 mg/150 ml premix     750 mg 150 mL/hr over 60 Minutes Intravenous Every 24 hours 01/01/15 0844     12/31/14 2100  vancomycin (VANCOCIN) IVPB 1000 mg/200 mL premix  Status:  Discontinued     1,000 mg 200 mL/hr over 60 Minutes Intravenous Every 48 hours 12/30/14 0812 01/01/15 0844   12/31/14  0800  levofloxacin (LEVAQUIN) IVPB 750 mg     750 mg 100 mL/hr over 90 Minutes Intravenous  Once 12/31/14 0750 12/31/14 1134   12/30/14 2200  cefTAZidime (FORTAZ) 1 g in dextrose 5 % 50 mL IVPB     1 g 100 mL/hr over 30 Minutes Intravenous Every 24 hours 12/30/14 0812     12/29/14 2345  vancomycin (VANCOCIN) IVPB 1000 mg/200 mL premix     1,000 mg 200 mL/hr over 60 Minutes Intravenous  Once 12/29/14 2333 12/30/14 0107   12/29/14 2330  cefTAZidime (FORTAZ) 1 g in dextrose 5 % 50 mL IVPB     1 g 100 mL/hr over 30 Minutes Intravenous  Once 12/29/14 2314 12/30/14 0213     Assessment: Okay for Protocol, initial doses  given last PM.  Acute kidney injury in patient with baseline renal dysfunction.  Acute on chronic renal failure.  SCr slightly improved.  Normalized clcr ~62ml/min.  Afebrile.  WBC improving.    Goal of Therapy:  Vancomycin trough level 15-20 mcg/ml  Plan:  Ceftazidime 1gm IV every 24 hours. Modify Vancomycin to  IV every 24 hours. Continue Levaquin  IV q48hrs Measure antibiotic drug levels at steady state Follow up culture results  Valrie Hart A 01/01/2015,8:44 AM

## 2015-01-02 LAB — CBC WITH DIFFERENTIAL/PLATELET
Basophils Absolute: 0.1 10*3/uL (ref 0.0–0.1)
Basophils Relative: 0 % (ref 0–1)
EOS ABS: 1.2 10*3/uL — AB (ref 0.0–0.7)
EOS PCT: 8 % — AB (ref 0–5)
HCT: 30.1 % — ABNORMAL LOW (ref 36.0–46.0)
HEMOGLOBIN: 10.1 g/dL — AB (ref 12.0–15.0)
LYMPHS ABS: 1.1 10*3/uL (ref 0.7–4.0)
Lymphocytes Relative: 7 % — ABNORMAL LOW (ref 12–46)
MCH: 29.4 pg (ref 26.0–34.0)
MCHC: 33.6 g/dL (ref 30.0–36.0)
MCV: 87.8 fL (ref 78.0–100.0)
MONO ABS: 1 10*3/uL (ref 0.1–1.0)
MONOS PCT: 7 % (ref 3–12)
Neutro Abs: 11.1 10*3/uL — ABNORMAL HIGH (ref 1.7–7.7)
Neutrophils Relative %: 77 % (ref 43–77)
Platelets: 283 10*3/uL (ref 150–400)
RBC: 3.43 MIL/uL — ABNORMAL LOW (ref 3.87–5.11)
RDW: 19 % — ABNORMAL HIGH (ref 11.5–15.5)
WBC: 14.3 10*3/uL — AB (ref 4.0–10.5)

## 2015-01-02 LAB — COMPREHENSIVE METABOLIC PANEL
ALBUMIN: 1.8 g/dL — AB (ref 3.5–5.0)
ALK PHOS: 96 U/L (ref 38–126)
ALT: 25 U/L (ref 14–54)
ANION GAP: 7 (ref 5–15)
AST: 23 U/L (ref 15–41)
BUN: 67 mg/dL — AB (ref 6–20)
CALCIUM: 9.2 mg/dL (ref 8.9–10.3)
CO2: 14 mmol/L — ABNORMAL LOW (ref 22–32)
Chloride: 114 mmol/L — ABNORMAL HIGH (ref 101–111)
Creatinine, Ser: 1.4 mg/dL — ABNORMAL HIGH (ref 0.44–1.00)
GFR calc Af Amer: 40 mL/min — ABNORMAL LOW (ref >60)
GFR calc non Af Amer: 34 mL/min — ABNORMAL LOW (ref >60)
GLUCOSE: 220 mg/dL — AB (ref 65–99)
POTASSIUM: 4.1 mmol/L (ref 3.5–5.1)
Sodium: 135 mmol/L (ref 135–145)
TOTAL PROTEIN: 5 g/dL — AB (ref 6.5–8.1)
Total Bilirubin: 1 mg/dL (ref 0.3–1.2)

## 2015-01-02 LAB — FOLATE RBC
FOLATE, HEMOLYSATE: 543 ng/mL
FOLATE, RBC: 2403 ng/mL (ref >498)
HEMATOCRIT: 22.6 % — AB (ref 34.0–46.6)

## 2015-01-02 LAB — TYPE AND SCREEN
ABO/RH(D): AB POS
Antibody Screen: NEGATIVE
Unit division: 0
Unit division: 0

## 2015-01-02 LAB — GLUCOSE, CAPILLARY
Glucose-Capillary: 198 mg/dL — ABNORMAL HIGH (ref 65–99)
Glucose-Capillary: 236 mg/dL — ABNORMAL HIGH (ref 65–99)
Glucose-Capillary: 255 mg/dL — ABNORMAL HIGH (ref 65–99)
Glucose-Capillary: 264 mg/dL — ABNORMAL HIGH (ref 65–99)

## 2015-01-02 LAB — PHOSPHORUS: Phosphorus: 3.4 mg/dL (ref 2.5–4.6)

## 2015-01-02 LAB — MAGNESIUM: MAGNESIUM: 2.1 mg/dL (ref 1.7–2.4)

## 2015-01-02 MED ORDER — TRACE MINERALS CR-CU-MN-SE-ZN 10-1000-500-60 MCG/ML IV SOLN
INTRAVENOUS | Status: AC
  Administered 2015-01-02: 19:00:00 via INTRAVENOUS
  Filled 2015-01-02: qty 1680

## 2015-01-02 MED ORDER — DEXTROSE 5 % IV SOLN
1.0000 g | Freq: Two times a day (BID) | INTRAVENOUS | Status: DC
  Administered 2015-01-02 – 2015-01-04 (×4): 1 g via INTRAVENOUS
  Filled 2015-01-02 (×6): qty 1

## 2015-01-02 MED ORDER — LEVOFLOXACIN IN D5W 750 MG/150ML IV SOLN: 750.0000 mg | INTRAVENOUS | Status: DC

## 2015-01-02 MED ORDER — SALINE SPRAY 0.65 % NA SOLN
1.0000 | NASAL | Status: DC | PRN
  Administered 2015-01-02: 1 via NASAL
  Filled 2015-01-02: qty 44

## 2015-01-02 MED ORDER — VANCOMYCIN HCL IN DEXTROSE 1-5 GM/200ML-% IV SOLN
1000.0000 mg | INTRAVENOUS | Status: DC
  Administered 2015-01-02 – 2015-01-03 (×2): 1000 mg via INTRAVENOUS
  Filled 2015-01-02 (×3): qty 200

## 2015-01-02 MED ORDER — VANCOMYCIN HCL IN DEXTROSE 1-5 GM/200ML-% IV SOLN
INTRAVENOUS | Status: AC
  Filled 2015-01-02: qty 200

## 2015-01-02 NOTE — Progress Notes (Signed)
Inpatient Diabetes Program Recommendations  AACE/ADA: New Consensus Statement on Inpatient Glycemic Control (2013)  Target Ranges:  Prepandial:   less than 140 mg/dL      Peak postprandial:   less than 180 mg/dL (1-2 hours)      Critically ill patients:  140 - 180 mg/dL   Review of Glycemic Control:  Results for Tina [name], Tina [name] (MRN [id number]) as of 01/02/2015 10:18  Ref. Range 01/01/2015 07:52 01/01/2015 11:51 01/01/2015 17:32 01/02/2015 00:23 01/02/2015 06:48  Glucose-Capillary Latest Ref Range: 65-99 mg/dL 865135 (H) 784192 (H) 696195 (H) 264 (H) 236 (H)   Note CBG's increased with TNA.  Consider increasing Novolog correction to moderate q 4 hours.  Thanks, Beryl MeagerJenny Saleemah Mollenhauer, RN, BC-ADM Inpatient Diabetes Coordinator Pager [phone] (8a-5p)

## 2015-01-02 NOTE — Progress Notes (Signed)
Subjective: ARF: improving slowly Hcap: Pt is on vanco, ceftazidime D#4 wbc improving but might be dilutional pox stable Metabolic acidosis improving FEN: po intake poor DM2: bs stable Anemia: no brbpr per staff.  Delirium: More alert today. Pt has attempted to remove dressing to central line, She has been placed in mittens due to history of pulling out picc lines in the past.  Skin tear:  stable  Objective: Vital signs in last 24 hours: Temp:  [97.4 F (36.3 C)-100.7 F (38.2 C)] 97.8 F (36.6 C) (07/08 0400) Pulse Rate:  [67-101] 73 (07/08 0600) Resp:  [15-40] 23 (07/08 0600) BP: (106-173)/(46-80) 138/50 mmHg (07/08 0600) SpO2:  [95 %-99 %] 95 % (07/08 0600) Weight change:  Last BM Date: 01/01/15  Intake/Output from previous day: 07/07 0701 - 07/08 0700 In: 2113.9 [P.O.:240; I.V.:203.3; Blood:670; IV Piggyback:100; TPN:900.6] Out: 1150 [Urine:1150] Intake/Output this shift: Total I/O In: 601.4 [Blood:335; TPN:266.4] Out: -   Heent: anicteric Neck: no jvd Heart: rrr s1, s2 Lung: ctab Abd: soft,  Ext: no c/c/e Skin: tear on the left sacral area , stable  Lab Results:  Recent Labs  01/01/15 0510 01/02/15 0426  WBC 14.2* 14.3*  HGB 7.3* 10.1*  HCT 22.1* 30.1*  PLT 299 283   BMET  Recent Labs  01/01/15 0510 01/02/15 0426  NA 135 135  K 3.7 4.1  CL 113* 114*  CO2 13* 14*  GLUCOSE 168* 220*  BUN 79* 67*  CREATININE 1.92* 1.40*  CALCIUM 8.8* 9.2    Studies/Results: No results found.  Medications: I have reviewed the patient's current medications.  Assessment/Plan: Hcap:  Blood cultures pending, no growth so far Add levaquin per pharmacy consultation Cont vanco, cont ceftazidime D#4  Sepsis secondary to uti, hcap,  Cont current abx  UTI Cont current abx  ARF Improving Holding lasix  Anemia Type and screen, check b12, folate, ferritin Transfuse 2 units prbc  CHF EF(35- 40%) Not in chf clinically  Metabolic  acidosis Improving  Dm2 Cont fsbs , change to q4h Sensitive sliding scale  Hypertension  on carvedilol 3.125mg  po bid  Depression  on zoloft 50mg  po qday  Pressure ulcer stable  FEN TPN temporarily since she has such severe protein calorie malnutrition and this will also improve her wound healing, no peg due to fact that she would be at risk of pulling out peg  Delirium due to infection appears to be improving  Code Status: FULL CODE  LOS: 4 days   Pearson GrippeKIM, Elener Custodio 01/02/2015, 6:38 AM

## 2015-01-02 NOTE — Progress Notes (Signed)
PARENTERAL NUTRITION CONSULT NOTE - follow up  Pharmacy Consult for TPN Indication: intolerance to enteral feeding / protein - calorie malnutrition / wound healing  No Known Allergies  Patient Measurements: Height: 5' 3.5" (161.3 cm) Weight: 200 lb 6.4 oz (90.9 kg) IBW/kg (Calculated) : 53.55 Adjusted Body Weight: 73.2Kg  Vital Signs: Temp: 97.5 F (36.4 C) (07/08 1200) Temp Source: Oral (07/08 1200) BP: 138/50 mmHg (07/08 0600) Pulse Rate: 73 (07/08 0600) Intake/Output from previous day: 07/07 0701 - 07/08 0700 In: 2113.9 [P.O.:240; I.V.:203.3; Blood:670; IV Piggyback:100; TPN:900.6] Out: 2400 [Urine:2400] Intake/Output from this shift:    Labs:  Recent Labs  12/31/14 0440 01/01/15 0510 01/02/15 0426  WBC 21.3* 14.2* 14.3*  HGB 7.5* 7.3* 10.1*  HCT 22.6* 22.1* 30.1*  PLT 299 299 283    Recent Labs  12/31/14 0440 01/01/15 0510 01/02/15 0426  NA 135 135 135  K 3.5 3.7 4.1  CL 111 113* 114*  CO2 13* 13* 14*  GLUCOSE 129* 168* 220*  BUN 94* 79* 67*  CREATININE 2.31* 1.92* 1.40*  CALCIUM 8.3* 8.8* 9.2  MG  --  1.6* 2.1  PHOS  --  4.4 3.4  PROT 4.7* 4.9* 5.0*  ALBUMIN 1.6* 1.7* 1.8*  AST 39 31 23  ALT ALKPHOS 77 81 96  BILITOT 0.5 0.4 1.0  PREALBUMIN  --  18.7  --   TRIG  --  232*  --    Estimated Creatinine Clearance: 34.1 mL/min (by C-G formula based on Cr of 1.4).    Recent Labs  01/02/15 0023 01/02/15 0648 01/02/15 1121  GLUCAP 264* 236* 198*   Medical History: Past Medical History  Diagnosis Date  . Diabetes mellitus   . Hypertension   . Osteopenia   . Peripheral vascular disease, unspecified   . GERD (gastroesophageal reflux disease)   . Arthritis   . Shortness of breath     exersion  . Pneumonia     child  . Anemia     hx  . Fall at home October 09, 2013  . DM (diabetes mellitus) type II uncontrolled, periph vascular disorder 09/17/2014  . Hepatitis     "a"  . CHF (congestive heart failure) 11/2014    EF 35-40%    Insulin Requirements in the past 24 hours:  10 UNITS  Current Nutrition:  Titrating TPN  Assessment: 81yo obese female with ARF.  Body mass index is 34.94 kg/(m^2). Pt admitted with possible pneumonia, sepsis, metabolic/lactic acidosis and low albumin and decubitus ulcers to buttocks and legs.  Pt is essentially bed bound with failure to thrive.  Acute on chronic renal failure, SCr continue to improve.  Albumin low and now trending up.  Prealbumin 18.7.  Recent FOB (+), H/H improved post transfusion. Labs reviewed:  Estimated Creatinine Clearance: 34.1 mL/min (by C-G formula based on Cr of 1.4).  Triglycerides slightly elevated.  Serum glucose trending up.   I/O (+)  814 7/7, good UOP.    Nutritional Goals:  742 kCal, 108 grams of protein per day (per IBW)  GOAL:  Meet ~ 80% of calorie needs and 100% of protein needs during 1st week of ICU stay (based on Ideal Body Weight).  F/U recommendations from clinical dietician.  Plan:  Increase Clinimix E 5/15 to 20ml/hr Titrate TPN rate slowly to goal of 9ml/hr if able to tolerate (pt w/ ARF). Continue Sliding Scale Insulin q6hrs, add 6 units insulin to TPN. Monitor labs, renal fxn, electrolytes, fluid status,  and glucose tolerance Defer starting lipids for now.  Mady GemmaHayes, Leona Pressly R 01/02/2015,12:00 PM

## 2015-01-02 NOTE — Progress Notes (Signed)
ANTIBIOTIC CONSULT NOTE  Pharmacy Consult for Vancomycin / Ceftazidime / Levaquin Indication: pneumonia / UTI / Sepsis / decubitus ulcers  No Known Allergies  Patient Measurements: Height: 5' 3.5" (161.3 cm) Weight: 200 lb 6.4 oz (90.9 kg) IBW/kg (Calculated) : 53.55 Vital Signs: Temp: 97.5 F (36.4 C) (07/08 1200) Temp Source: Oral (07/08 1200) BP: 138/50 mmHg (07/08 0600) Pulse Rate: 73 (07/08 0600) Intake/Output from previous day: 07/07 0701 - 07/08 0700 In: 2113.9 [P.O.:240; I.V.:203.3; Blood:670; IV Piggyback:100; TPN:900.6] Out: 2400 [Urine:2400] Intake/Output from this shift:    Labs:  Recent Labs  12/31/14 0440 01/01/15 0510 01/02/15 0426  WBC 21.3* 14.2* 14.3*  HGB 7.5* 7.3* 10.1*  PLT 299 299 283  CREATININE 2.31* 1.92* 1.40*   Estimated Creatinine Clearance: 34.1 mL/min (by C-G formula based on Cr of 1.4). No results for input(s): VANCOTROUGH, VANCOPEAK, VANCORANDOM, GENTTROUGH, GENTPEAK, GENTRANDOM, TOBRATROUGH, TOBRAPEAK, TOBRARND, AMIKACINPEAK, AMIKACINTROU, AMIKACIN in the last 72 hours.   Microbiology: Recent Results (from the past 720 hour(s))  Urine culture     Status: None   Collection Time: 12/29/14  9:35 PM  Result Value Ref Range Status   Specimen Description URINE, CATHETERIZED  Final   Special Requests NONE  Final   Culture   Final    MULTIPLE SPECIES PRESENT, SUGGEST RECOLLECTION IF CLINICALLY INDICATED Performed at [facility]    Report Status 12/31/2014 FINAL  Final  Blood culture (routine x 2)     Status: None (Preliminary result)   Collection Time: 12/29/14  9:58 PM  Result Value Ref Range Status   Specimen Description BLOOD PORT  Final   Special Requests   Final    BOTTLES DRAWN AEROBIC AND ANAEROBIC AEB 11CC ANA 6CC   Culture NO GROWTH 3 DAYS  Final   Report Status PENDING  Incomplete  Blood culture (routine x 2)     Status: None (Preliminary result)   Collection Time: 12/30/14 12:00 AM  Result Value Ref Range Status    Specimen Description BLOOD PORT  Final   Special Requests BOTTLES DRAWN AEROBIC ONLY 8CC  Final   Culture NO GROWTH 2 DAYS  Final   Report Status PENDING  Incomplete  MRSA PCR Screening     Status: None   Collection Time: 12/30/14 12:21 AM  Result Value Ref Range Status   MRSA by PCR NEGATIVE NEGATIVE Final    Comment:        The GeneXpert MRSA Assay (FDA approved for NASAL specimens only), is one component of a comprehensive MRSA colonization surveillance program. It is not intended to diagnose MRSA infection nor to guide or monitor treatment for MRSA infections.   Clostridium Difficile by PCR (not at [facility])     Status: None   Collection Time: 12/31/14  4:55 PM  Result Value Ref Range Status   C difficile by pcr NEGATIVE NEGATIVE Final    Anti-infectives    Start     Dose/Rate Route Frequency Ordered Stop   01/02/15 1000  levofloxacin (LEVAQUIN) IVPB 500 mg     500 mg 100 mL/hr over 60 Minutes Intravenous Every 48 hours 12/31/14 1133     01/01/15 2100  vancomycin (VANCOCIN) IVPB 750 mg/150 ml premix     750 mg 150 mL/hr over 60 Minutes Intravenous Every 24 hours 01/01/15 0844     12/31/14 2100  vancomycin (VANCOCIN) IVPB 1000 mg/200 mL premix  Status:  Discontinued     1,000 mg 200 mL/hr over 60 Minutes Intravenous Every 48 hours  12/30/14 0812 01/01/15 0844   12/31/14 0800  levofloxacin (LEVAQUIN) IVPB 750 mg     750 mg 100 mL/hr over 90 Minutes Intravenous  Once 12/31/14 0750 12/31/14 1134   12/30/14 2200  cefTAZidime (FORTAZ) 1 g in dextrose 5 % 50 mL IVPB     1 g 100 mL/hr over 30 Minutes Intravenous Every 24 hours 12/30/14 0812     12/29/14 2345  vancomycin (VANCOCIN) IVPB 1000 mg/200 mL premix     1,000 mg 200 mL/hr over 60 Minutes Intravenous  Once 12/29/14 2333 12/30/14 0107   12/29/14 2330  cefTAZidime (FORTAZ) 1 g in dextrose 5 % 50 mL IVPB     1 g 100 mL/hr over 30 Minutes Intravenous  Once 12/29/14 2314 12/30/14 0213     Assessment: Okay for Protocol,  Acute kidney injury in patient with baseline renal dysfunction.  Acute on chronic renal failure.  SCr improved.  Afebrile.  WBC stable.    Goal of Therapy:  Vancomycin trough level 15-20 mcg/ml  Plan:  Modify Ceftazidime 1gm IV every 12 hours. Modify Vancomycin to 1000mg  IV every 24 hours. Modify Levaquin 750mg  IV q48hrs Measure antibiotic drug levels at steady state Follow up culture results  Mady GemmaHayes, Nilo Fallin R 01/02/2015,12:41 PM

## 2015-01-02 NOTE — Care Management (Signed)
Important Message  Patient Details  Name: Tina [name] MRN: [id number] Date of Birth: 10/31/1930   Medicare Important Message Given:  Yes-second notification given    Malcolm MetroChildress, Lamonta Cypress Demske, RN 01/02/2015, 12:52 PM

## 2015-01-02 NOTE — Progress Notes (Signed)
Text paged Dr. Conley RollsLe patient is in her room.  Patient arrived on the floor with daughter Tina LundJanet via w/c in stable condition.  Patient was changed into her gown, weight/VS completed and oriented to the room.

## 2015-01-02 NOTE — Clinical Social Work Note (Signed)
CSW updated [facility] on pt. Facility continues to be willing to accept pt when stable. Will continue to follow.  Derenda FennelKara Maxson Oddo, LCSW [phone]

## 2015-01-02 NOTE — Care Management Note (Signed)
Case Management Note  Patient Details  Name: Tina CoopShirley A Diguglielmo MRN: [id number] Date of Birth: 02/17/1931  Expected Discharge Date:                  Expected Discharge Plan:  Skilled Nursing Facility  In-House Referral:  Clinical Social Work  Discharge planning Services  CM Consult  Post Acute Care Choice:  NA Choice offered to:  NA  DME Arranged:    DME Agency:     HH Arranged:    HH Agency:     Status of Service:  Completed, signed off  Medicare Important Message Given:  Yes-second notification given Date Medicare IM Given:    Medicare IM give by:    Date Additional Medicare IM Given:    Additional Medicare Important Message give by:     If discussed at Long Length of Stay Meetings, dates discussed:    Additional Comments: Pt will return to SNF at DC. No CM needs. Malcolm Metrohildress, Adasha Boehme Demske, RN 01/02/2015, 12:53 PM

## 2015-01-03 LAB — COMPREHENSIVE METABOLIC PANEL
ALK PHOS: 92 U/L (ref 38–126)
ALT: 22 U/L (ref 14–54)
ANION GAP: 6 (ref 5–15)
AST: 20 U/L (ref 15–41)
Albumin: 1.9 g/dL — ABNORMAL LOW (ref 3.5–5.0)
BUN: 62 mg/dL — ABNORMAL HIGH (ref 6–20)
CHLORIDE: 113 mmol/L — AB (ref 101–111)
CO2: 17 mmol/L — AB (ref 22–32)
Calcium: 9.5 mg/dL (ref 8.9–10.3)
Creatinine, Ser: 1.13 mg/dL — ABNORMAL HIGH (ref 0.44–1.00)
GFR calc Af Amer: 51 mL/min — ABNORMAL LOW (ref >60)
GFR, EST NON AFRICAN AMERICAN: 44 mL/min — AB (ref >60)
GLUCOSE: 222 mg/dL — AB (ref 65–99)
Potassium: 4 mmol/L (ref 3.5–5.1)
SODIUM: 136 mmol/L (ref 135–145)
Total Bilirubin: 0.5 mg/dL (ref 0.3–1.2)
Total Protein: 5.2 g/dL — ABNORMAL LOW (ref 6.5–8.1)

## 2015-01-03 LAB — GLUCOSE, CAPILLARY
GLUCOSE-CAPILLARY: 223 mg/dL — AB (ref 65–99)
GLUCOSE-CAPILLARY: 267 mg/dL — AB (ref 65–99)
Glucose-Capillary: 270 mg/dL — ABNORMAL HIGH (ref 65–99)
Glucose-Capillary: 202 mg/dL — ABNORMAL HIGH (ref 65–99)

## 2015-01-03 LAB — CBC WITH DIFFERENTIAL/PLATELET
Basophils Absolute: 0.1 10*3/uL (ref 0.0–0.1)
Basophils Relative: 1 % (ref 0–1)
EOS ABS: 0.9 10*3/uL — AB (ref 0.0–0.7)
Eosinophils Relative: 6 % — ABNORMAL HIGH (ref 0–5)
HEMATOCRIT: 31.1 % — AB (ref 36.0–46.0)
HEMOGLOBIN: 10.4 g/dL — AB (ref 12.0–15.0)
Lymphocytes Relative: 13 % (ref 12–46)
Lymphs Abs: 1.8 10*3/uL (ref 0.7–4.0)
MCH: 29.5 pg (ref 26.0–34.0)
MCHC: 33.4 g/dL (ref 30.0–36.0)
MCV: 88.1 fL (ref 78.0–100.0)
Monocytes Absolute: 1.1 10*3/uL — ABNORMAL HIGH (ref 0.1–1.0)
Monocytes Relative: 8 % (ref 3–12)
Neutro Abs: 10.3 10*3/uL — ABNORMAL HIGH (ref 1.7–7.7)
Neutrophils Relative %: 72 % (ref 43–77)
Platelets: 291 10*3/uL (ref 150–400)
RBC: 3.53 MIL/uL — ABNORMAL LOW (ref 3.87–5.11)
RDW: 19.9 % — AB (ref 11.5–15.5)
WBC: 14.2 10*3/uL — ABNORMAL HIGH (ref 4.0–10.5)

## 2015-01-03 LAB — CULTURE, BLOOD (ROUTINE X 2): Culture: NO GROWTH

## 2015-01-03 LAB — SEDIMENTATION RATE: SED RATE: 92 mm/h — AB (ref 0–22)

## 2015-01-03 MED ORDER — TRACE MINERALS CR-CU-MN-SE-ZN 10-1000-500-60 MCG/ML IV SOLN: INTRAVENOUS | Status: DC

## 2015-01-03 MED ORDER — SODIUM CHLORIDE 0.9 % IV SOLN
INTRAVENOUS | Status: AC
  Administered 2015-01-03 (×2): via INTRAVENOUS

## 2015-01-03 MED ORDER — CARVEDILOL 3.125 MG PO TABS
6.2500 mg | ORAL_TABLET | Freq: Two times a day (BID) | ORAL | Status: DC
  Administered 2015-01-03 – 2015-01-07 (×4): 6.25 mg via ORAL
  Filled 2015-01-03 (×6): qty 2

## 2015-01-03 MED ORDER — VANCOMYCIN HCL IN DEXTROSE 1-5 GM/200ML-% IV SOLN
INTRAVENOUS | Status: AC
  Filled 2015-01-03: qty 200

## 2015-01-03 MED ORDER — TRACE MINERALS CR-CU-MN-SE-ZN 10-1000-500-60 MCG/ML IV SOLN
INTRAVENOUS | Status: AC
  Administered 2015-01-03: 18:00:00 via INTRAVENOUS
  Filled 2015-01-03: qty 1800

## 2015-01-03 MED ORDER — TRACE MINERALS CR-CU-MN-SE-ZN 10-1000-500-60 MCG/ML IV SOLN
INTRAVENOUS | Status: DC
  Filled 2015-01-03 (×2): qty 1680

## 2015-01-03 NOTE — Progress Notes (Signed)
Subjective: ARF: almost normal Hcap: Pt is on vanco, ceftazidime D#5 wbc improving but might be dilutional pox stable Metabolic acidosis improving FEN: po intake poor DM2: bs stable Anemia: no brbpr per staff.  Delirium: More alert today. Pt has attempted in the past to remove dressing to central line, She has been placed in mittens due to history of pulling out picc lines in the past.  Skin tear: stable  Objective: Vital signs in last 24 hours: Temp:  [97.5 F (36.4 C)-98.4 F (36.9 C)] 98 F (36.7 C) (07/09 0540) Pulse Rate:  [70-80] 80 (07/09 0540) Resp:  [20-22] 20 (07/09 0540) BP: (104-157)/(57-88) 157/72 mmHg (07/09 0540) SpO2:  [96 %-97 %] 97 % (07/09 0540) Weight change:  Last BM Date: 01/02/15  Intake/Output from previous day: 07/08 0701 - 07/09 0700 In: 120 [P.O.:120] Out: 1500 [Urine:1500] Intake/Output this shift:    Heent: anicteric Neck: no jvd Heart: rrr s1, s2 Lung: ctab Abd: soft, nt, nd, +bs Ext: no c/c/ trace edema  Lab Results:  Recent Labs  01/01/15 0510 01/01/15 0915 01/02/15 0426  WBC 14.2*  --  14.3*  HGB 7.3*  --  10.1*  HCT 22.1* 22.6* 30.1*  PLT 299  --  283   BMET  Recent Labs  01/01/15 0510 01/02/15 0426  NA 135 135  K 3.7 4.1  CL 113* 114*  CO2 13* 14*  GLUCOSE 168* 220*  BUN 79* 67*  CREATININE 1.92* 1.40*  CALCIUM 8.8* 9.2    Studies/Results: No results found.  Medications: I have reviewed the patient's current medications.  Assessment/Plan: Hcap:  Blood cultures pending, no growth so far Add levaquin per pharmacy consultation Cont vanco, cont ceftazidime D#5  Sepsis secondary to uti, hcap,  Cont current abx  UTI Cont current abx  ARF Improving Holding lasix  Anemia Stable  CHF EF(35- 40%) Not in chf clinically  Metabolic acidosis Improving  Dm2 Cont fsbs , change to q4h Sensitive sliding scale  Hypertension on carvedilol 3.125mg  po bid  Depression  on zoloft 50mg  po  qday  Pressure ulcer stable  FEN TPN temporarily since she has such severe protein calorie malnutrition and this will also improve her wound healing, no peg due to fact that she would be at risk of pulling out peg  Delirium due to infection appears to be improving   LOS: 5 days   Pearson GrippeKIM, Lillah Standre 01/03/2015, 7:27 AM

## 2015-01-03 NOTE — Progress Notes (Signed)
PARENTERAL NUTRITION CONSULT NOTE - follow up  Pharmacy Consult for TPN Indication: intolerance to enteral feeding / protein - calorie malnutrition / wound healing  No Known Allergies  Patient Measurements: Height: 5' 3.5" (161.3 cm) Weight: 200 lb 6.4 oz (90.9 kg) IBW/kg (Calculated) : 53.55 Adjusted Body Weight: 73.2Kg  Vital Signs: Temp: 98 F (36.7 C) (07/09 0540) Temp Source: Oral (07/09 0540) BP: 157/72 mmHg (07/09 0540) Pulse Rate: 80 (07/09 0540) Intake/Output from previous day: 07/08 0701 - 07/09 0700 In: 120 [P.O.:120] Out: 1500 [Urine:1500] Intake/Output from this shift:    Labs:  Recent Labs  01/01/15 0510 01/01/15 0915 01/02/15 0426 01/03/15 0605  WBC 14.2*  --  14.3* 14.2*  HGB 7.3*  --  10.1* 10.4*  HCT 22.1* 22.6* 30.1* 31.1*  PLT 299  --  283 291    Recent Labs  01/01/15 0510 01/02/15 0426 01/03/15 0605  NA 135 135 136  K 3.7 4.1 4.0  CL 113* 114* 113*  CO2 13* 14* 17*  GLUCOSE 168* 220* 222*  BUN 79* 67* 62*  CREATININE 1.92* 1.40* 1.13*  CALCIUM 8.8* 9.2 9.5  MG 1.6* 2.1  --   PHOS 4.4 3.4  --   PROT 4.9* 5.0* 5.2*  ALBUMIN 1.7* 1.8* 1.9*  AST 31 23 20   ALT 28 25 22   ALKPHOS 81 96 92  BILITOT 0.4 1.0 0.5  PREALBUMIN 18.7  --   --   TRIG 232*  --   --    Estimated Creatinine Clearance: 42.2 mL/min (by C-G formula based on Cr of 1.13).    Recent Labs  01/02/15 1644 01/03/15 0048 01/03/15 0544  GLUCAP 255* 267* 223*   Medical History: Past Medical History  Diagnosis Date  . Diabetes mellitus   . Hypertension   . Osteopenia   . Peripheral vascular disease, unspecified   . GERD (gastroesophageal reflux disease)   . Arthritis   . Shortness of breath     exersion  . Pneumonia     child  . Anemia     hx  . Fall at home October 09, 2013  . DM (diabetes mellitus) type II uncontrolled, periph vascular disorder 09/17/2014  . Hepatitis     "a"  . CHF (congestive heart failure) 11/2014    EF 35-40%   Insulin Requirements  in the past 24 hours:  20 UNITS  Current Nutrition:  Titrating TPN  Assessment: 81yo obese female with ARF.  Body mass index is 34.94 kg/(m^2). Pt admitted with possible pneumonia, sepsis, metabolic/lactic acidosis and low albumin and decubitus ulcers to buttocks and legs.  Pt is essentially bed bound with failure to thrive.  Acute on chronic renal failure, SCr continue to improve.  Albumin low and now trending up.  Prealbumin 18.7.  Recent FOB (+), H/H improved post transfusion. Labs reviewed:  Estimated Creatinine Clearance: 42.2 mL/min (by C-G formula based on Cr of 1.13).  Triglycerides slightly elevated.  Serum glucose same as yesterday, still elevated.    Nutritional Goals:  742 kCal, 108 grams of protein per day (per IBW)  GOAL:  Meet ~ 80% of calorie needs and 100% of protein needs during 1st week of ICU stay (based on Ideal Body Weight).  F/U recommendations from clinical dietician.  Plan:  Increase Clinimix E 5/15 to 6275ml/hr Titrate TPN rate slowly to goal of 3990ml/hr if able to tolerate (pt w/ ARF). Continue Sliding Scale Insulin q6hrs, increase insulin to 10 units in TPN Monitor labs, renal fxn, electrolytes,  fluid status, and glucose tolerance Defer starting lipids for now.  Raquel James, Mayari Matus Bennett 01/03/2015,11:01 AM

## 2015-01-04 LAB — GLUCOSE, CAPILLARY
Glucose-Capillary: 255 mg/dL — ABNORMAL HIGH (ref 65–99)
Glucose-Capillary: 189 mg/dL — ABNORMAL HIGH (ref 65–99)
Glucose-Capillary: 219 mg/dL — ABNORMAL HIGH (ref 65–99)
Glucose-Capillary: 235 mg/dL — ABNORMAL HIGH (ref 65–99)
Glucose-Capillary: 259 mg/dL — ABNORMAL HIGH (ref 65–99)

## 2015-01-04 LAB — COMPREHENSIVE METABOLIC PANEL
ALK PHOS: 94 U/L (ref 38–126)
ALT: 21 U/L (ref 14–54)
AST: 20 U/L (ref 15–41)
Albumin: 1.8 g/dL — ABNORMAL LOW (ref 3.5–5.0)
Anion gap: 5 (ref 5–15)
BILIRUBIN TOTAL: 0.5 mg/dL (ref 0.3–1.2)
BUN: 52 mg/dL — ABNORMAL HIGH (ref 6–20)
CO2: 18 mmol/L — AB (ref 22–32)
Calcium: 9.4 mg/dL (ref 8.9–10.3)
Chloride: 113 mmol/L — ABNORMAL HIGH (ref 101–111)
Creatinine, Ser: 0.9 mg/dL (ref 0.44–1.00)
GFR, EST NON AFRICAN AMERICAN: 58 mL/min — AB (ref >60)
GLUCOSE: 215 mg/dL — AB (ref 65–99)
POTASSIUM: 4.1 mmol/L (ref 3.5–5.1)
Sodium: 136 mmol/L (ref 135–145)
TOTAL PROTEIN: 5.1 g/dL — AB (ref 6.5–8.1)

## 2015-01-04 LAB — CBC WITH DIFFERENTIAL/PLATELET
Basophils Absolute: 0.1 10*3/uL (ref 0.0–0.1)
Basophils Relative: 1 % (ref 0–1)
EOS PCT: 7 % — AB (ref 0–5)
Eosinophils Absolute: 1 10*3/uL — ABNORMAL HIGH (ref 0.0–0.7)
HEMATOCRIT: 30.3 % — AB (ref 36.0–46.0)
Hemoglobin: 10.2 g/dL — ABNORMAL LOW (ref 12.0–15.0)
LYMPHS ABS: 1.8 10*3/uL (ref 0.7–4.0)
Lymphocytes Relative: 12 % (ref 12–46)
MCH: 30.1 pg (ref 26.0–34.0)
MCHC: 33.7 g/dL (ref 30.0–36.0)
MCV: 89.4 fL (ref 78.0–100.0)
MONOS PCT: 8 % (ref 3–12)
Monocytes Absolute: 1.2 10*3/uL — ABNORMAL HIGH (ref 0.1–1.0)
NEUTROS ABS: 10.8 10*3/uL — AB (ref 1.7–7.7)
Neutrophils Relative %: 72 % (ref 43–77)
Platelets: 286 10*3/uL (ref 150–400)
RBC: 3.39 MIL/uL — ABNORMAL LOW (ref 3.87–5.11)
RDW: 20 % — ABNORMAL HIGH (ref 11.5–15.5)
WBC: 14.9 10*3/uL — AB (ref 4.0–10.5)

## 2015-01-04 MED ORDER — DEXTROSE 5 % IV SOLN
1.0000 g | Freq: Three times a day (TID) | INTRAVENOUS | Status: DC
  Administered 2015-01-04 – 2015-01-05 (×3): 1 g via INTRAVENOUS
  Filled 2015-01-04 (×6): qty 1

## 2015-01-04 MED ORDER — TRACE MINERALS CR-CU-MN-SE-ZN 10-1000-500-60 MCG/ML IV SOLN
INTRAVENOUS | Status: AC
  Administered 2015-01-04: 18:00:00 via INTRAVENOUS
  Filled 2015-01-04: qty 1800

## 2015-01-04 MED ORDER — VANCOMYCIN HCL IN DEXTROSE 750-5 MG/150ML-% IV SOLN
750.0000 mg | Freq: Two times a day (BID) | INTRAVENOUS | Status: DC
  Administered 2015-01-04 – 2015-01-05 (×2): 750 mg via INTRAVENOUS
  Filled 2015-01-04 (×5): qty 150

## 2015-01-04 MED ORDER — LEVOFLOXACIN IN D5W 750 MG/150ML IV SOLN
750.0000 mg | INTRAVENOUS | Status: DC
  Administered 2015-01-04 – 2015-01-05 (×2): 750 mg via INTRAVENOUS
  Filled 2015-01-04 (×2): qty 150

## 2015-01-04 MED ORDER — LORATADINE 10 MG PO TABS
10.0000 mg | ORAL_TABLET | Freq: Every day | ORAL | Status: DC
  Administered 2015-01-04 – 2015-01-07 (×2): 10 mg via ORAL
  Filled 2015-01-04 (×3): qty 1

## 2015-01-04 MED ORDER — OXYMETAZOLINE HCL 0.05 % NA SOLN
1.0000 | Freq: Once | NASAL | Status: AC
  Administered 2015-01-04: 1 via NASAL
  Filled 2015-01-04: qty 15

## 2015-01-04 NOTE — Progress Notes (Signed)
PARENTERAL NUTRITION CONSULT NOTE - follow up  Pharmacy Consult for TPN Indication: intolerance to enteral feeding / protein - calorie malnutrition / wound healing  No Known Allergies  Patient Measurements: Height: 5' 3.5" (161.3 cm) Weight: 200 lb 6.4 oz (90.9 kg) IBW/kg (Calculated) : 53.55 Adjusted Body Weight: 73.2Kg  Vital Signs: Temp: 98 F (36.7 C) (07/10 0445) Temp Source: Oral (07/10 0445) BP: 160/62 mmHg (07/10 0445) Pulse Rate: 81 (07/10 0445) Intake/Output from previous day: 07/09 0701 - 07/10 0700 In: 3894.3 [I.V.:561.7; IV Piggyback:300; TPN:3032.6] Out: 1900 [Urine:1900] Intake/Output from this shift:    Labs:  Recent Labs  01/02/15 0426 01/03/15 0605 01/04/15 0619  WBC 14.3* 14.2* 14.9*  HGB 10.1* 10.4* 10.2*  HCT 30.1* 31.1* 30.3*  PLT 283 291 286    Recent Labs  01/02/15 0426 01/03/15 0605 01/04/15 0619  NA 135 136 136  K 4.1 4.0 4.1  CL 114* 113* 113*  CO2 14* 17* 18*  GLUCOSE 220* 222* 215*  BUN 67* 62* 52*  CREATININE 1.40* 1.13* 0.90  CALCIUM 9.2 9.5 9.4  MG 2.1  --   --   PHOS 3.4  --   --   PROT 5.0* 5.2* 5.1*  ALBUMIN 1.8* 1.9* 1.8*  AST 23 20 20   ALT 25 22 21   ALKPHOS 96 92 94  BILITOT 1.0 0.5 0.5   Estimated Creatinine Clearance: 53 mL/min (by C-G formula based on Cr of 0.9).    Recent Labs  01/04/15 0021 01/04/15 0624 01/04/15 0759  GLUCAP 255* 189* 219*   Medical History: Past Medical History  Diagnosis Date  . Diabetes mellitus   . Hypertension   . Osteopenia   . Peripheral vascular disease, unspecified   . GERD (gastroesophageal reflux disease)   . Arthritis   . Shortness of breath     exersion  . Pneumonia     child  . Anemia     hx  . Fall at home October 09, 2013  . DM (diabetes mellitus) type II uncontrolled, periph vascular disorder 09/17/2014  . Hepatitis     "a"  . CHF (congestive heart failure) 11/2014    EF 35-40%   Insulin Requirements in the past 24 hours:  25 UNITS  Current  Nutrition:  Titrating TPN  Assessment: 81yo obese female with ARF.  Body mass index is 34.94 kg/(m^2). Pt admitted with possible pneumonia, sepsis, metabolic/lactic acidosis and low albumin and decubitus ulcers to buttocks and legs.  Pt is essentially bed bound with failure to thrive.  Acute on chronic renal failure, SCr continue to improve.  Albumin low and now trending up.  Prealbumin 18.7.  Recent FOB (+), H/H improved post transfusion. Labs reviewed:  Estimated Creatinine Clearance: 53 mL/min (by C-G formula based on Cr of 0.9).  Triglycerides slightly elevated.  Serum glucose same as yesterday, still elevated.    Nutritional Goals:  742 kCal, 108 grams of protein per day (per IBW)  GOAL:  Meet ~ 80% of calorie needs and 100% of protein needs during 1st week of ICU stay (based on Ideal Body Weight).  F/U recommendations from clinical dietician.  Plan:  Continue Clinimix E 5/15 to 5275ml/hr Titrate TPN rate slowly to goal of 3990ml/hr if able to tolerate (pt w/ ARF). Continue Sliding Scale Insulin q6hrs, increase insulin to 20 units in TPN Monitor labs, renal fxn, electrolytes, fluid status, and glucose tolerance Defer starting lipids for now.  Raquel JamesPittman, Renald Haithcock Bennett 01/04/2015,10:25 AM

## 2015-01-04 NOTE — Progress Notes (Signed)
Subjective: Nasal congestion:  Pt family requesting afrin 1 ARF: resolved Hcap: Pt is on vanco, ceftazidime D#6 wbc improving but might be dilutional pox stable Metabolic acidosis improving FEN: po intake poor DM2: bs stable Anemia: no brbpr per staff.  Delirium: More alert today. Pt has attempted in the past to remove dressing to central line, She has been placed in mittens due to history of pulling out picc lines in the past.  Skin tear: stable   Objective: Vital signs in last 24 hours: Temp:  [98 F (36.7 C)-98.2 F (36.8 C)] 98.2 F (36.8 C) (07/10 1402) Pulse Rate:  [81-87] 87 (07/10 1402) Resp:  [18] 18 (07/10 1402) BP: (149-160)/(61-62) 149/61 mmHg (07/10 1402) SpO2:  [94 %-95 %] 94 % (07/10 1402) Weight change:  Last BM Date: 01/03/15  Intake/Output from previous day: 07/09 0701 - 07/10 0700 In: 3894.3 [I.V.:561.7; IV Piggyback:300; TPN:3032.6] Out: 1900 [Urine:1900] Intake/Output this shift:    Heent: anicteric Neck: no jvd Heart: rrr s1, s2 Lung: ctab Abd: soft, nt, nd, +bs Ext: no c/c/ trace edema Skin: no rash, skin tear stable  Lab Results:  Recent Labs  01/03/15 0605 01/04/15 0619  WBC 14.2* 14.9*  HGB 10.4* 10.2*  HCT 31.1* 30.3*  PLT 291 286   BMET  Recent Labs  01/03/15 0605 01/04/15 0619  NA 136 136  K 4.0 4.1  CL 113* 113*  CO2 17* 18*  GLUCOSE 222* 215*  BUN 62* 52*  CREATININE 1.13* 0.90  CALCIUM 9.5 9.4    Studies/Results: No results found.  Medications: I have reviewed the patient's current medications.  Assessment/Plan: Hcap:  Cont vanco, cont ceftazidime D#6 Cont levaquin  Sepsis secondary to uti, hcap,  Cont current abx  UTI Cont current abx  ARF Improving Holding lasix  Anemia Stable  CHF EF(35- 40%) Not in chf clinically  Metabolic acidosis Improving  Dm2 Cont fsbs , change to q4h Sensitive sliding scale  Hypertension on carvedilol 3.125mg  po bid  Depression  on zoloft 50mg   po qday  Pressure ulcer stable  FEN TPN temporarily since she has such severe protein calorie malnutrition and this will also improve her wound healing, no peg due to fact that she would be at risk of pulling out peg  Delirium due to infection appears to be improving   LOS: 6 days   Pearson GrippeKIM, Rakeem Colley 01/04/2015, 9:49 PM

## 2015-01-04 NOTE — Progress Notes (Signed)
ANTIBIOTIC CONSULT NOTE  Pharmacy Consult for Vancomycin / Ceftazidime / Levaquin Indication: pneumonia / UTI / Sepsis / decubitus ulcers  No Known Allergies  Patient Measurements: Height: 5' 3.5" (161.3 cm) Weight: 200 lb 6.4 oz (90.9 kg) IBW/kg (Calculated) : 53.55 Vital Signs: Temp: 98 F (36.7 C) (07/10 0445) Temp Source: Oral (07/10 0445) BP: 160/62 mmHg (07/10 0445) Pulse Rate: 81 (07/10 0445) Intake/Output from previous day: 07/09 0701 - 07/10 0700 In: 3894.3 [I.V.:561.7; IV Piggyback:300; TPN:3032.6] Out: 1900 [Urine:1900] Intake/Output from this shift:    Labs:  Recent Labs  01/02/15 0426 01/03/15 0605 01/04/15 0619  WBC 14.3* 14.2* 14.9*  HGB 10.1* 10.4* 10.2*  PLT 283 291 286  CREATININE 1.40* 1.13* 0.90   Estimated Creatinine Clearance: 53 mL/min (by C-G formula based on Cr of 0.9). No results for input(s): VANCOTROUGH, VANCOPEAK, VANCORANDOM, GENTTROUGH, GENTPEAK, GENTRANDOM, TOBRATROUGH, TOBRAPEAK, TOBRARND, AMIKACINPEAK, AMIKACINTROU, AMIKACIN in the last 72 hours.   Microbiology: Recent Results (from the past 720 hour(s))  Urine culture     Status: None   Collection Time: 12/29/14  9:35 PM  Result Value Ref Range Status   Specimen Description URINE, CATHETERIZED  Final   Special Requests NONE  Final   Culture   Final    MULTIPLE SPECIES PRESENT, SUGGEST RECOLLECTION IF CLINICALLY INDICATED [facility]    Report Status 12/31/2014 FINAL  Final  Blood culture (routine x 2)     Status: None   Collection Time: 12/29/14  9:58 PM  Result Value Ref Range Status   Specimen Description BLOOD PORT  Final   Special Requests   Final    BOTTLES DRAWN AEROBIC AND ANAEROBIC AEB 11CC ANA 6CC   Culture NO GROWTH 5 DAYS  Final   Report Status 01/03/2015 FINAL  Final  Blood culture (routine x 2)     Status: None (Preliminary result)   Collection Time: 12/30/14 12:00 AM  Result Value Ref Range Status   Specimen Description BLOOD PORT   Final   Special Requests BOTTLES DRAWN AEROBIC ONLY 8CC  Final   Culture NO GROWTH 4 DAYS  Final   Report Status PENDING  Incomplete  MRSA PCR Screening     Status: None   Collection Time: 12/30/14 12:21 AM  Result Value Ref Range Status   MRSA by PCR NEGATIVE NEGATIVE Final    Comment:        The GeneXpert MRSA Assay (FDA approved for NASAL specimens only), is one component of a comprehensive MRSA colonization surveillance program. It is not intended to diagnose MRSA infection nor to guide or monitor treatment for MRSA infections.   Clostridium Difficile by PCR (not at [facility])     Status: None   Collection Time: 12/31/14  4:55 PM  Result Value Ref Range Status   C difficile by pcr NEGATIVE NEGATIVE Final    Anti-infectives    Start     Dose/Rate Route Frequency Ordered Stop   01/04/15 1800  cefTAZidime (FORTAZ) 1 g in dextrose 5 % 50 mL IVPB     1 g 100 mL/hr over 30 Minutes Intravenous 3 times per day 01/04/15 1035     01/04/15 1400  vancomycin (VANCOCIN) IVPB 750 mg/150 ml premix     750 mg 150 mL/hr over 60 Minutes Intravenous Every 12 hours 01/04/15 1032     01/04/15 1100  levofloxacin (LEVAQUIN) IVPB 750 mg     750 mg 100 mL/hr over 90 Minutes Intravenous Every 24 hours 01/04/15 1036  01/04/15 1000  levofloxacin (LEVAQUIN) IVPB 750 mg  Status:  Discontinued     750 mg 100 mL/hr over 90 Minutes Intravenous Every 48 hours 01/02/15 1245 01/04/15 1036   01/02/15 2100  vancomycin (VANCOCIN) IVPB 1000 mg/200 mL premix  Status:  Discontinued     1,000 mg 200 mL/hr over 60 Minutes Intravenous Every 24 hours 01/02/15 1245 01/04/15 1032   01/02/15 1600  cefTAZidime (FORTAZ) 1 g in dextrose 5 % 50 mL IVPB  Status:  Discontinued     1 g 100 mL/hr over 30 Minutes Intravenous Every 12 hours 01/02/15 1245 01/04/15 1035   01/02/15 1000  levofloxacin (LEVAQUIN) IVPB 500 mg  Status:  Discontinued     500 mg 100 mL/hr over 60 Minutes Intravenous Every 48 hours 12/31/14 1133  01/02/15 1245   01/01/15 2100  vancomycin (VANCOCIN) IVPB 750 mg/150 ml premix  Status:  Discontinued     750 mg 150 mL/hr over 60 Minutes Intravenous Every 24 hours 01/01/15 0844 01/02/15 1245   12/31/14 2100  vancomycin (VANCOCIN) IVPB 1000 mg/200 mL premix  Status:  Discontinued     1,000 mg 200 mL/hr over 60 Minutes Intravenous Every 48 hours 12/30/14 0812 01/01/15 0844   12/31/14 0800  levofloxacin (LEVAQUIN) IVPB 750 mg     750 mg 100 mL/hr over 90 Minutes Intravenous  Once 12/31/14 0750 12/31/14 1134   12/30/14 2200  cefTAZidime (FORTAZ) 1 g in dextrose 5 % 50 mL IVPB  Status:  Discontinued     1 g 100 mL/hr over 30 Minutes Intravenous Every 24 hours 12/30/14 0812 01/02/15 1245   12/29/14 2345  vancomycin (VANCOCIN) IVPB 1000 mg/200 mL premix     1,000 mg 200 mL/hr over 60 Minutes Intravenous  Once 12/29/14 2333 12/30/14 0107   12/29/14 2330  cefTAZidime (FORTAZ) 1 g in dextrose 5 % 50 mL IVPB     1 g 100 mL/hr over 30 Minutes Intravenous  Once 12/29/14 2314 12/30/14 0213     Assessment: Okay for Protocol, Acute kidney injury in patient with baseline renal dysfunction.  Acute on chronic renal failure.  SCr improved.  Afebrile.  WBC stable.    Goal of Therapy:  Vancomycin trough level 15-20 mcg/ml  Plan:  Modify Ceftazidime 1gm IV every 8 hours. Modify Vancomycin to 750 mg every 12 hours. Modify Levaquin  IV q24hrs Measure antibiotic drug levels at steady state Follow up culture results  Raquel James, Alenna Russell Bennett 01/04/2015,10:37 AM

## 2015-01-05 ENCOUNTER — Encounter (HOSPITAL_COMMUNITY): Payer: Self-pay

## 2015-01-05 LAB — DIFFERENTIAL
BASOS ABS: 0.1 10*3/uL (ref 0.0–0.1)
BASOS PCT: 0 % (ref 0–1)
EOS ABS: 1.1 10*3/uL — AB (ref 0.0–0.7)
Eosinophils Relative: 7 % — ABNORMAL HIGH (ref 0–5)
Lymphocytes Relative: 13 % (ref 12–46)
Lymphs Abs: 2 10*3/uL (ref 0.7–4.0)
Monocytes Absolute: 1.3 10*3/uL — ABNORMAL HIGH (ref 0.1–1.0)
Monocytes Relative: 8 % (ref 3–12)
NEUTROS PCT: 72 % (ref 43–77)
Neutro Abs: 11.3 10*3/uL — ABNORMAL HIGH (ref 1.7–7.7)

## 2015-01-05 LAB — CBC
HCT: 31.5 % — ABNORMAL LOW (ref 36.0–46.0)
HEMOGLOBIN: 10.5 g/dL — AB (ref 12.0–15.0)
MCH: 29.7 pg (ref 26.0–34.0)
MCHC: 33.3 g/dL (ref 30.0–36.0)
MCV: 89.2 fL (ref 78.0–100.0)
PLATELETS: 288 10*3/uL (ref 150–400)
RBC: 3.53 MIL/uL — ABNORMAL LOW (ref 3.87–5.11)
RDW: 19.9 % — ABNORMAL HIGH (ref 11.5–15.5)
WBC: 15.8 10*3/uL — AB (ref 4.0–10.5)

## 2015-01-05 LAB — COMPREHENSIVE METABOLIC PANEL
ALBUMIN: 1.9 g/dL — AB (ref 3.5–5.0)
ALK PHOS: 100 U/L (ref 38–126)
ALT: 18 U/L (ref 14–54)
ANION GAP: 6 (ref 5–15)
AST: 23 U/L (ref 15–41)
BUN: 51 mg/dL — ABNORMAL HIGH (ref 6–20)
CHLORIDE: 111 mmol/L (ref 101–111)
CO2: 18 mmol/L — AB (ref 22–32)
CREATININE: 0.87 mg/dL (ref 0.44–1.00)
Calcium: 9.6 mg/dL (ref 8.9–10.3)
GFR calc non Af Amer: >60 mL/min (ref >60)
Glucose, Bld: 210 mg/dL — ABNORMAL HIGH (ref 65–99)
POTASSIUM: 4.4 mmol/L (ref 3.5–5.1)
Sodium: 135 mmol/L (ref 135–145)
Total Bilirubin: 0.6 mg/dL (ref 0.3–1.2)
Total Protein: 5.3 g/dL — ABNORMAL LOW (ref 6.5–8.1)

## 2015-01-05 LAB — PREALBUMIN: Prealbumin: 27.4 mg/dL (ref 18–38)

## 2015-01-05 LAB — CULTURE, BLOOD (ROUTINE X 2): Culture: NO GROWTH

## 2015-01-05 LAB — GLUCOSE, CAPILLARY
GLUCOSE-CAPILLARY: 207 mg/dL — AB (ref 65–99)
Glucose-Capillary: 199 mg/dL — ABNORMAL HIGH (ref 65–99)
Glucose-Capillary: 180 mg/dL — ABNORMAL HIGH (ref 65–99)
Glucose-Capillary: 214 mg/dL — ABNORMAL HIGH (ref 65–99)

## 2015-01-05 LAB — MAGNESIUM: Magnesium: 1.7 mg/dL (ref 1.7–2.4)

## 2015-01-05 LAB — PHOSPHORUS: PHOSPHORUS: 3.5 mg/dL (ref 2.5–4.6)

## 2015-01-05 LAB — VANCOMYCIN, TROUGH: Vancomycin Tr: 39 ug/mL (ref 10.0–20.0)

## 2015-01-05 LAB — TRIGLYCERIDES: Triglycerides: 196 mg/dL — ABNORMAL HIGH (ref <150)

## 2015-01-05 MED ORDER — FAT EMULSION 20 % IV EMUL
250.0000 mL | INTRAVENOUS | Status: AC
  Administered 2015-01-05: 250 mL via INTRAVENOUS
  Filled 2015-01-05: qty 250

## 2015-01-05 MED ORDER — TRACE MINERALS CR-CU-MN-SE-ZN 10-1000-500-60 MCG/ML IV SOLN
INTRAVENOUS | Status: AC
  Administered 2015-01-05: 19:00:00 via INTRAVENOUS
  Filled 2015-01-05: qty 1992

## 2015-01-05 NOTE — Progress Notes (Signed)
Inpatient Diabetes Program Recommendations  AACE/ADA: New Consensus Statement on Inpatient Glycemic Control (2013)  Target Ranges:  Prepandial:   less than 140 mg/dL      Peak postprandial:   less than 180 mg/dL (1-2 hours)      Critically ill patients:  140 - 180 mg/dL   Review of Glycemic Control:  Results for Penelope CoopCURRAN, Willistine A (MRN [id number]) as of 01/05/2015 11:20  Ref. Range 01/04/2015 11:42 01/04/2015 17:41 01/05/2015 01:38 01/05/2015 05:33  Glucose-Capillary Latest Ref Range: 65-99 mg/dL 409235 (H) 811259 (H) 914199 (H) 207 (H)   Note that patient is receiving TPN which is likely increasing blood sugars.  Please consider increasing Novolog correction to moderate q 4 hours.  Thanks, Beryl MeagerJenny Heather Mckendree, RN, BC-ADM Inpatient Diabetes Coordinator Pager [phone] (8a-5p)

## 2015-01-05 NOTE — Progress Notes (Signed)
PARENTERAL NUTRITION CONSULT NOTE - follow up  Pharmacy Consult for TPN Indication: intolerance to enteral feeding / protein - calorie malnutrition / wound healing  No Known Allergies  Patient Measurements: Height: 5' 3.5" (161.3 cm) Weight: 200 lb 6.4 oz (90.9 kg) IBW/kg (Calculated) : 53.55 Adjusted Body Weight: 73.2Kg  Vital Signs: Temp: 98.2 F (36.8 C) (07/11 0608) Temp Source: Oral (07/11 16100608) BP: 115/68 mmHg (07/11 96040608) Pulse Rate: 78 (07/11 0608) Intake/Output from previous day: 07/10 0701 - 07/11 0700 In: 0  Out: 700 [Urine:700] Intake/Output from this shift: Total I/O In: 10 [P.O.:10] Out: -   Labs:  Recent Labs  01/03/15 0605 01/04/15 0619 01/05/15 0623  WBC 14.2* 14.9* 15.8*  HGB 10.4* 10.2* 10.5*  HCT 31.1* 30.3* 31.5*  PLT 291 286 288    Recent Labs  01/03/15 0605 01/04/15 0619 01/05/15 0623  NA 136 136 135  K 4.0 4.1 4.4  CL 113* 113* 111  CO2 17* 18* 18*  GLUCOSE 222* 215* 210*  BUN 62* 52* 51*  CREATININE 1.13* 0.90 0.87  CALCIUM 9.5 9.4 9.6  MG  --   --  1.7  PHOS  --   --  3.5  PROT 5.2* 5.1* 5.3*  ALBUMIN 1.9* 1.8* 1.9*  AST 20 20 23   ALT 22 21 18   ALKPHOS 92 94 100  BILITOT 0.5 0.5 0.6  TRIG  --   --  196*   Estimated Creatinine Clearance: 54.8 mL/min (by C-G formula based on Cr of 0.87).    Recent Labs  01/04/15 1741 01/05/15 0138 01/05/15 0533  GLUCAP 259* 199* 207*   Medical History: Past Medical History  Diagnosis Date  . Diabetes mellitus   . Hypertension   . Osteopenia   . Peripheral vascular disease, unspecified   . GERD (gastroesophageal reflux disease)   . Arthritis   . Shortness of breath     exersion  . Pneumonia     child  . Anemia     hx  . Fall at home October 09, 2013  . DM (diabetes mellitus) type II uncontrolled, periph vascular disorder 09/17/2014  . Hepatitis     "a"  . CHF (congestive heart failure) 11/2014    EF 35-40%   Insulin Requirements in the past 24 hours:  13 UNITS  SSI  Current Nutrition:  Titrating TPN  Assessment: 81yo obese female with ARF.  Body mass index is 34.94 kg/(m^2). Pt admitted with possible pneumonia, sepsis, metabolic/lactic acidosis and low albumin and decubitus ulcers to buttocks and legs.  Pt is essentially bed bound with failure to thrive.  Acute on chronic renal failure resolved..  Albumin low (stable).  Prealbumin pending.  Labs reviewed:  Estimated Creatinine Clearance: 54.8 mL/min (by C-G formula based on Cr of 0.87).  Triglycerides slightly elevated but lower than previous.  Serum glucose elevated  Nutritional Goals:  1725-1900 kCal, 100-120 grams of protein per day  Plan:  Clinimix E 5/15 to 83 ml/hr (goal) Add Lipids since out of ICU and therapy > 7 days Continue Sliding Scale Insulin q6hrs, increase insulin to 20 units in TPN Monitor labs, renal fxn, electrolytes, fluid status, and glucose tolerance  Lamonte RicherHayes, Ahyana Skillin R 01/05/2015,11:18 AM

## 2015-01-05 NOTE — Progress Notes (Signed)
Talked with Kendal HymenBonnie, pt's RN and since pt has IV access and is not being discharged today we will hold off until tomorrow morning for insertion of PICC line. PICC nurse arrives at 0830 tomorrow morning and she will be notified upon arriving.

## 2015-01-05 NOTE — Progress Notes (Signed)
CRITICAL VALUE ALERT  Critical value received:  Vanc trough Date of notification: 01/05/15 Time of notification:  1418  Critical value read back:Yes.    Nurse who received alert:  Glennie IsleBonnie Holbrook, RN  MD notified (1st page): Dr. [name] Time of first page:  1440  Time MD responded:  1440

## 2015-01-05 NOTE — Progress Notes (Signed)
Pt refused all  PO meds this AM.

## 2015-01-05 NOTE — Progress Notes (Signed)
Initial Nutrition Assessment  DOCUMENTATION CODES:  Moderate malnutrition in chronic illness  Obesity unspecified  INTERVENTION:  If pt expected to be unable to meet nutrition needs orally for > 6 weeks: Recommend consider PEG tube placement and use abdomial binder to prevent pt from pulling out tube vs TPN.  Initiate Glucerna 1.2 @ 20 ml/hr via PEG and increase by 10 ml every  hours to goal rate of 40 ml/hr.   Add 30 ml Prostat TID.    Tube feeding regimen provides 1452  Kcal, 102 gr of protein, and 773 ml of H2O.    If no IV fluids add 300 ml flushes water- TID to meet est needs  NUTRITION DIAGNOSIS:  Inadequate oral intake related to lethargy/confusion as evidenced by meal completion < 25%.   GOAL:  Patient will meet greater than or equal to 90% of their needs   MONITOR:  Labs, Skin, PO intake  REASON FOR ASSESSMENT: poor po intake,  on TPN   ASSESSMENT: Pt 81 year old female who  has a past medical history of Diabetes mellitus; Hypertension; Osteopenia; Peripheral vascular disease, unspecified; GERD (gastroesophageal reflux disease); Arthritis; Shortness of breath; Pneumonia; Anemia; Fall at home (October 09, 2013); DM (diabetes mellitus) type II uncontrolled, periph vascular disorder (09/17/2014); Hepatitis; and CHF (congestive heart failure) (11/2014). Failure to thrive. Presented to ED from skilled facility:hypotensive and lethargic.   She was started on Heart Healthy diet 7/5 and TPN initiated on 7/6. Current TPN at goal- Clinimix E 5/15 to 83 ml/hr (goal).  Her po intake has continued to be very poor (<25%) of meals consumed. She is unable to feed herself and has mitts on for safety.   She has weight loss since April (11%) and (5%) in 30 days both are significant. Pt has mild-moderate muscle wasting to upper body.  Per nursing pt is to have PICC placed and is planning to discharge with TPN. TPN initiated due to pt hx of pulling out tube per MD  note.  Height:  Ht Readings from Last 1 Encounters:  12/30/14 5' 3.5" (1.613 m)    Weight:  Wt Readings from Last 1 Encounters:  01/02/15 200 lb 6.4 oz (90.9 kg)    Ideal Body Weight:  56.8 kg  Wt Readings from Last 10 Encounters:  01/02/15 200 lb 6.4 oz (90.9 kg)  12/08/14 210 lb 12.8 oz (95.618 kg)  10/16/14 224 lb (101.606 kg)  09/07/14 224 lb 1.6 oz (101.651 kg)  09/08/14 224 lb (101.606 kg)  08/18/14 230 lb (104.327 kg)  10/11/13 232 lb (105.235 kg)  07/09/13 237 lb (107.502 kg)  07/05/13 237 lb 6.4 oz (107.684 kg)  08/24/12 240 lb (108.863 kg)    BMI:  Body mass index is 34.94 kg/(m^2).  Estimated Nutritional Needs:  Kcal:  1350-1485  Protein:  90-100 gr  Fluid:  >1500 ml daily  Skin:   Two Stage II   Diet Order:  Diet Heart Room service appropriate?: Yes; Fluid consistency:: Thin .TPN (CLINIMIX-E) Adult TPN (CLINIMIX-E) Adult  EDUCATION NEEDS:  Education needs no appropriate at this time   Intake/Output Summary (Last 24 hours) at 01/05/15 1548 Last data filed at 01/05/15 0900  Gross per 24 hour  Intake     10 ml  Output    700 ml  Net   -690 ml    Last BM:  7/8  Royann ShiversLynn Hannah Strader MS,RD,CSG,LDN Office: [phone] Pager: [phone]   Pt in room #328.

## 2015-01-05 NOTE — Progress Notes (Signed)
Subjective: Nasal congestion: improved with claritin ARF: resolved Hcap: Pt is on vanco, ceftazidime D#7 pox stable Metabolic acidosis improving FEN: po intake poor DM2: bs stable Anemia: no brbpr per staff.  Delirium: More alert today. Pt has attempted in the past to remove dressing to central line, She has been placed in mittens due to history of pulling out picc lines in the past.  Skin tear: stable  Objective: Vital signs in last 24 hours: Temp:  [97.4 F (36.3 C)-98.2 F (36.8 C)] 98.1 F (36.7 C) (07/11 1428) Pulse Rate:  [72-78] 74 (07/11 1428) Resp:  [18-20] 18 (07/11 1428) BP: (115-162)/(60-68) 152/64 mmHg (07/11 1428) SpO2:  [96 %-98 %] 98 % (07/11 1428) Weight change:  Last BM Date: 01/03/15  Intake/Output from previous day: 07/10 0701 - 07/11 0700 In: 0  Out: 700 [Urine:700] Intake/Output this shift:    Heent: anicteric Neck: no jvd Heart: rrr s1, s2 Lung: ctab Abd: soft, nt, nd, +bs Ext: no c/c/ trace edema  Lab Results:  Recent Labs  01/04/15 0619 01/05/15 0623  WBC 14.9* 15.8*  HGB 10.2* 10.5*  HCT 30.3* 31.5*  PLT 286 288   BMET  Recent Labs  01/04/15 0619 01/05/15 0623  NA 136 135  K 4.1 4.4  CL 113* 111  CO2 18* 18*  GLUCOSE 215* 210*  BUN 52* 51*  CREATININE 0.90 0.87  CALCIUM 9.4 9.6    Studies/Results: No results found.  Medications: I have reviewed the patient's current medications.  Assessment/Plan: Hcap:  Cont vanco, cont ceftazidime D#7 Cont levaquin  Sepsis secondary to uti, hcap,  Cont current abx  UTI Cont current abx  ARF Improving Holding lasix  Anemia Stable  CHF EF(35- 40%) Not in chf clinically  Metabolic acidosis Improving  Dm2 Cont fsbs , change to q4h Sensitive sliding scale  Hypertension on carvedilol 3.125mg  po bid  Depression  on zoloft 50mg  po qday  Pressure ulcer stable  FEN TPN temporarily since she has such severe protein calorie malnutrition and this  will also improve her wound healing, no peg due to fact that she would be at risk of pulling out peg  Delirium due to infection appears to be improving  DISPO: picc line and then back to [employer] if labs are stable   LOS: 7 days   Pearson GrippeKIM, [place] Souter 01/05/2015, 9:24 PM

## 2015-01-05 NOTE — Discharge Instructions (Addendum)
Nutrition Post Hospital Stay Proper nutrition can help your body recover from illness and injury.   Foods and beverages high in protein, vitamins, and minerals help rebuild muscle loss, promote healing, & reduce fall risk.   In addition to eating healthy foods, a nutrition shake is an easy, delicious way to get the nutrition you need during and after your hospital stay  It is recommended that you continue to drink 2 bottles per day of:     Ensure Enlive and ProStat 30 ml BID for at least 1 month (30 days) after your hospital stay   Tips for adding a nutrition shake into your routine: As allowed, drink one with vitamins or medications instead of water or juice Enjoy one as a tasty mid-morning or afternoon snack Drink cold or make a milkshake out of it Drink one instead of milk with cereal or snacks Use as a coffee creamer   Available at the following grocery stores and pharmacies:           * [employer] * [employer] * [employer]co  * Rite Aid          * Walmart * Sam's Club  * Walgreens      * Target  * BJ's   * CVS  * [employer]   * [facility] [phone]            For COUPONS visit: www.ensure.com/join or RoleLink.com.brwww.boost.com/members/sign-up   Suggested Substitutions Ensure Plus = Boost Plus = Carnation Breakfast Essentials = Boost Compact Ensure Active Clear = Boost Breeze Glucerna Shake = Boost Glucose Control = Carnation Breakfast Essentials SUGAR FREE

## 2015-01-06 ENCOUNTER — Inpatient Hospital Stay (HOSPITAL_COMMUNITY): Payer: Medicare Other

## 2015-01-06 LAB — CBC WITH DIFFERENTIAL/PLATELET
Basophils Absolute: 0.1 10*3/uL (ref 0.0–0.1)
Basophils Relative: 0 % (ref 0–1)
EOS PCT: 6 % — AB (ref 0–5)
Eosinophils Absolute: 0.9 10*3/uL — ABNORMAL HIGH (ref 0.0–0.7)
HCT: 31.5 % — ABNORMAL LOW (ref 36.0–46.0)
HEMOGLOBIN: 10.3 g/dL — AB (ref 12.0–15.0)
LYMPHS PCT: 13 % (ref 12–46)
Lymphs Abs: 1.8 10*3/uL (ref 0.7–4.0)
MCH: 29.5 pg (ref 26.0–34.0)
MCHC: 32.7 g/dL (ref 30.0–36.0)
MCV: 90.3 fL (ref 78.0–100.0)
MONOS PCT: 8 % (ref 3–12)
Monocytes Absolute: 1.1 10*3/uL — ABNORMAL HIGH (ref 0.1–1.0)
Neutro Abs: 10.5 10*3/uL — ABNORMAL HIGH (ref 1.7–7.7)
Neutrophils Relative %: 73 % (ref 43–77)
Platelets: 286 10*3/uL (ref 150–400)
RBC: 3.49 MIL/uL — ABNORMAL LOW (ref 3.87–5.11)
RDW: 19.8 % — ABNORMAL HIGH (ref 11.5–15.5)
WBC: 14.3 10*3/uL — ABNORMAL HIGH (ref 4.0–10.5)

## 2015-01-06 LAB — GLUCOSE, CAPILLARY
GLUCOSE-CAPILLARY: 215 mg/dL — AB (ref 65–99)
GLUCOSE-CAPILLARY: 241 mg/dL — AB (ref 65–99)
GLUCOSE-CAPILLARY: 200 mg/dL — AB (ref 65–99)
GLUCOSE-CAPILLARY: 180 mg/dL — AB (ref 65–99)
Glucose-Capillary: 222 mg/dL — ABNORMAL HIGH (ref 65–99)

## 2015-01-06 LAB — SEDIMENTATION RATE: Sed Rate: 65 mm/hr — ABNORMAL HIGH (ref 0–22)

## 2015-01-06 LAB — COMPREHENSIVE METABOLIC PANEL
ALK PHOS: 96 U/L (ref 38–126)
ALT: 15 U/L (ref 14–54)
ANION GAP: 7 (ref 5–15)
AST: 24 U/L (ref 15–41)
Albumin: 1.8 g/dL — ABNORMAL LOW (ref 3.5–5.0)
BUN: 50 mg/dL — AB (ref 6–20)
CALCIUM: 9.4 mg/dL (ref 8.9–10.3)
CO2: 18 mmol/L — ABNORMAL LOW (ref 22–32)
Chloride: 109 mmol/L (ref 101–111)
Creatinine, Ser: 0.9 mg/dL (ref 0.44–1.00)
GFR calc non Af Amer: 58 mL/min — ABNORMAL LOW (ref >60)
Glucose, Bld: 248 mg/dL — ABNORMAL HIGH (ref 65–99)
POTASSIUM: 4.7 mmol/L (ref 3.5–5.1)
Sodium: 134 mmol/L — ABNORMAL LOW (ref 135–145)
TOTAL PROTEIN: 5.3 g/dL — AB (ref 6.5–8.1)
Total Bilirubin: 0.4 mg/dL (ref 0.3–1.2)

## 2015-01-06 MED ORDER — FAMOTIDINE 20 MG PO TABS
20.0000 mg | ORAL_TABLET | Freq: Every day | ORAL | Status: DC
  Administered 2015-01-07: 20 mg via ORAL
  Filled 2015-01-06: qty 1

## 2015-01-06 MED ORDER — SERTRALINE HCL 50 MG PO TABS
100.0000 mg | ORAL_TABLET | Freq: Every day | ORAL | Status: DC
  Administered 2015-01-07: 100 mg via ORAL
  Filled 2015-01-06: qty 2

## 2015-01-06 MED ORDER — FAT EMULSION 20 % IV EMUL
250.0000 mL | INTRAVENOUS | Status: DC
  Administered 2015-01-06: 250 mL via INTRAVENOUS
  Filled 2015-01-06: qty 250

## 2015-01-06 MED ORDER — SERTRALINE HCL 100 MG PO TABS: 100.0000 mg | ORAL_TABLET | Freq: Every day | ORAL | Status: AC

## 2015-01-06 MED ORDER — INSULIN DETEMIR 100 UNIT/ML ~~LOC~~ SOLN: 4.0000 [IU] | Freq: Every day | SUBCUTANEOUS | Status: AC

## 2015-01-06 MED ORDER — SODIUM CHLORIDE 0.9 % IJ SOLN
10.0000 mL | Freq: Two times a day (BID) | INTRAMUSCULAR | Status: DC
Start: 2015-01-06 — End: 2015-01-07
  Administered 2015-01-06 – 2015-01-07 (×3): 10 mL

## 2015-01-06 MED ORDER — INSULIN ASPART 100 UNIT/ML ~~LOC~~ SOLN
0.0000 [IU] | SUBCUTANEOUS | Status: DC
  Administered 2015-01-06 – 2015-01-07 (×6): 3 [IU] via SUBCUTANEOUS

## 2015-01-06 MED ORDER — SODIUM CHLORIDE 0.9 % IV SOLN
INTRAVENOUS | Status: AC
  Administered 2015-01-06: 22:00:00 via INTRAVENOUS

## 2015-01-06 MED ORDER — CARVEDILOL 6.25 MG PO TABS: 6.2500 mg | ORAL_TABLET | Freq: Two times a day (BID) | ORAL | Status: AC

## 2015-01-06 MED ORDER — INSULIN ASPART 100 UNIT/ML ~~LOC~~ SOLN: 0.0000 [IU] | SUBCUTANEOUS | Status: AC

## 2015-01-06 MED ORDER — TRACE MINERALS CR-CU-MN-SE-ZN 10-1000-500-60 MCG/ML IV SOLN
INTRAVENOUS | Status: DC
  Administered 2015-01-06: 19:00:00 via INTRAVENOUS
  Filled 2015-01-06: qty 1992

## 2015-01-06 MED ORDER — INSULIN DETEMIR 100 UNIT/ML ~~LOC~~ SOLN
4.0000 [IU] | Freq: Every day | SUBCUTANEOUS | Status: DC
  Administered 2015-01-06 – 2015-01-07 (×2): 4 [IU] via SUBCUTANEOUS
  Filled 2015-01-06 (×3): qty 0.04

## 2015-01-06 MED ORDER — FAMOTIDINE 20 MG PO TABS: 20.0000 mg | ORAL_TABLET | Freq: Every day | ORAL | Status: AC

## 2015-01-06 MED ORDER — SODIUM CHLORIDE 0.9 % IJ SOLN: 10.0000 mL | INTRAMUSCULAR | Status: DC | PRN

## 2015-01-06 MED ORDER — LORATADINE 10 MG PO TABS: 10.0000 mg | ORAL_TABLET | Freq: Every day | ORAL | Status: AC

## 2015-01-06 NOTE — Progress Notes (Signed)
Subjective: Nasal congestion: improved with claritin ARF: resolved Hcap:pt finished 7 days of abx, vanco zosyn, levaquin Metabolic acidosis improving FEN: po intake poor, awaiting picc placement DM2: bs stable Anemia: no brbpr per staff.  Delirium: More alert today. Pt has attempted in the past to remove dressing to central line, She has been placed in mittens due to history of pulling out picc lines in the past.  Skin tear: stable  Objective: Vital signs in last 24 hours: Temp:  [97.8 F (36.6 C)-98.4 F (36.9 C)] 98.2 F (36.8 C) (07/12 1606) Pulse Rate:  [84-90] 86 (07/12 1606) Resp:  [18-22] 18 (07/12 1606) BP: (145-149)/(83-98) 145/83 mmHg (07/12 1606) SpO2:  [97 %-98 %] 98 % (07/12 1606) Weight change:  Last BM Date: 01/03/15  Intake/Output from previous day: 07/11 0701 - 07/12 0700 In: 35 [P.O.:35] Out: 2300 [Urine:2300] Intake/Output this shift: Total I/O In: 10 [I.V.:10] Out: -   Heent: anicteric Neck: no jvd Heart: rrr s1, s2 Lung: ctab Abd: soft, nt, nd, +bs Ext: no c/c/  Trace edema   Lab Results:  Recent Labs  01/05/15 0623 01/06/15 0623  WBC 15.8* 14.3*  HGB 10.5* 10.3*  HCT 31.5* 31.5*  PLT 288 286   BMET  Recent Labs  01/05/15 0623 01/06/15 0623  NA 135 134*  K 4.4 4.7  CL 111 109  CO2 18* 18*  GLUCOSE 210* 248*  BUN 51* 50*  CREATININE 0.87 0.90  CALCIUM 9.6 9.4    Studies/Results: Dg Chest Port 1 View  01/06/2015   CLINICAL DATA:  81 year old female post line placement. Subsequent encounter.  EXAM: PORTABLE CHEST - 1 VIEW  COMPARISON:  12/29/2014  FINDINGS: New left central line is been placed with the tip at the level of the mid superior vena cava directed slightly laterally.  Right central line tip unchanged in position at the level of the mid superior vena cava.  No gross pneumothorax.  Pulmonary vascular congestion/peribronchial thickening.  No segmental consolidation.  Mild cardiomegaly.  Mildly tortuous aorta.   IMPRESSION: New left central line is been placed with the tip at the level of the mid superior vena cava directed slightly laterally.  Right central line tip unchanged in position at the level of the mid superior vena cava.  No gross pneumothorax.  Pulmonary vascular congestion/peribronchial thickening.   Electronically Signed   By: Lacy DuverneySteven  Olson M.D.   On: 01/06/2015 12:28    Medications: I have reviewed the patient's current medications.  Assessment/Plan: Hcap:  finished vanco, cont ceftazidime, levaquin D#7  Sepsis secondary to uti, hcap,  Finished abx  UTI Completed abx  ARF Improving Holding lasix  Anemia Stable after having had transfusion of 2 units prbc  CHF EF(35- 40%) Not in chf clinically  Metabolic acidosis Improving, hydrate gently with ns iv  Dm2 Cont fsbs , change to q4h Have increased to moderate iss Start on levemir 4 units [place] qday  Hypertension increase carvedilol 6.25mg  po bid  Depression  Increase zoloft to 100mg  po qday  Pressure ulcer stable  FEN TPN temporarily since she has such severe protein calorie malnutrition and this will also improve her wound healing, no peg due to fact that she would be at risk of pulling out peg  Delirium due to infection appears to be improving  DISPO: picc line and then back to [facility] if labs are stable   LOS: 8 days   Pearson GrippeKIM, Jacqui Headen 01/06/2015, 6:58 PM

## 2015-01-06 NOTE — Progress Notes (Signed)
Pt refused all PO meds this AM. Pt is allowing blood sugar checks and insulin coverage.

## 2015-01-06 NOTE — Progress Notes (Signed)
PARENTERAL NUTRITION CONSULT NOTE - follow up  Pharmacy Consult for TPN Indication: intolerance to enteral feeding / protein - calorie malnutrition / wound healing  No Known Allergies  Patient Measurements: Height: 5' 3.5" (161.3 cm) Weight: 200 lb 6.4 oz (90.9 kg) IBW/kg (Calculated) : 53.55 Adjusted Body Weight: 73.2Kg  Vital Signs: Temp: 98.4 F (36.9 C) (07/12 0642) Temp Source: Oral (07/12 40980642) BP: 149/98 mmHg (07/12 0642) Pulse Rate: 90 (07/12 0642) Intake/Output from previous day: 07/11 0701 - 07/12 0700 In: 35 [P.O.:35] Out: 2300 [Urine:2300] Intake/Output from this shift:    Labs:  Recent Labs  01/04/15 0619 01/05/15 0623 01/06/15 0623  WBC 14.9* 15.8* 14.3*  HGB 10.2* 10.5* 10.3*  HCT 30.3* 31.5* 31.5*  PLT 286 288 286    Recent Labs  01/04/15 0619 01/05/15 0623 01/06/15 0623  NA 136 135 134*  K 4.1 4.4 4.7  CL 113* 111 109  CO2 18* 18* 18*  GLUCOSE 215* 210* 248*  BUN 52* 51* 50*  CREATININE 0.90 0.87 0.90  CALCIUM 9.4 9.6 9.4  MG  --  1.7  --   PHOS  --  3.5  --   PROT 5.1* 5.3* 5.3*  ALBUMIN 1.8* 1.9* 1.8*  AST 20 23 24   ALT 21 18 15   ALKPHOS 94 100 96  BILITOT 0.5 0.6 0.4  PREALBUMIN  --  27.4  --   TRIG  --  196*  --    Estimated Creatinine Clearance: 53 mL/min (by C-G formula based on Cr of 0.9).    Recent Labs  01/05/15 1900 01/06/15 0113 01/06/15 0704  GLUCAP 214* 215* 241*   Medical History: Past Medical History  Diagnosis Date  . Diabetes mellitus   . Hypertension   . Osteopenia   . Peripheral vascular disease, unspecified   . GERD (gastroesophageal reflux disease)   . Arthritis   . Shortness of breath     exersion  . Pneumonia     child  . Anemia     hx  . Fall at home October 09, 2013  . DM (diabetes mellitus) type II uncontrolled, periph vascular disorder 09/17/2014  . Hepatitis     "a"  . CHF (congestive heart failure) 11/2014    EF 35-40%   Insulin Requirements in the past 24 hours:  11 UNITS  SSI  Current Nutrition:  Titrating TPN  Assessment: 81yo obese female with ARF.  Body mass index is 34.94 kg/(m^2). Pt admitted with possible pneumonia, sepsis, metabolic/lactic acidosis and low albumin and decubitus ulcers to buttocks and legs.  Pt is essentially bed bound with failure to thrive.  Acute on chronic renal failure resolved..  Albumin low (stable).  Prealbumin pending.  Labs reviewed:  Estimated Creatinine Clearance: 53 mL/min (by C-G formula based on Cr of 0.9).  Triglycerides slightly elevated but lower than previous.  Serum glucose elevated, Na trending down.  Nutritional Goals:  1725-1900 kCal, 100-120 grams of protein per day  Plan:  Clinimix E 5/15 to 83 ml/hr (goal) Lipids @ 10 cc/hr Change Sliding Scale Insulin to q 4hrs (moderate), increase insulin to 30 units in TPN Monitor labs, renal fxn, electrolytes, fluid status, and glucose tolerance  Mady GemmaHayes, Lyndol Vanderheiden R 01/06/2015,12:45 PM

## 2015-01-06 NOTE — Discharge Summary (Addendum)
Physician Discharge Summary  Patient ID: Tina [name] MRN: [id number] DOB/AGE: 1930-10-20 81 y.o.  Admit date: 12/29/2014 Discharge date: 01/07/2015  Admission Diagnoses: Sepsis secondary to UTI, ? Hcap UTI ARF FTT Severe protein calorie malnutrition Dementia/Delirium Dm2 Pressure ulcer CHF (EF 40-45%) Anemia  Discharge Diagnoses:  Active Problems:   FTT (failure to thrive) in adult   AKI (acute kidney injury)   Dehydration   Leukocytosis   DM type 2 (diabetes mellitus, type 2)   UTI (lower urinary tract infection)   Pressure ulcer   HCAP (healthcare-associated pneumonia)   Chronic systolic CHF (congestive heart failure)   Lactic acidosis   Sepsis   CKD (chronic kidney disease) stage 3, GFR 30-59 ml/min   Palliative care encounter   Urinary tract infectious disease Anemia  Discharged Condition: stable  Hospital Course:  81 yo female with hx of dementia, had elevated wbc at SNF and sent to ED for evaluation found to have sepsis secondary to uti/  ? Hcap.  CXR in ED => Pulmonary vascular congestion/peribronchial thickening.  Pt was admitted for sepsis secondary to uti ? Hcap.  Pt noted to have ARF with creatinine 2.57 (12/29/2014) as well. Pt started on vanco, and ceftazidine, and later levaquin added.  Wbc decreased to 14.3  Pt on 01/01/2015 noted to have hgb 7.3,  Transfused with 2 units prbc.  Pt was hydrated gently with ns iv, and creatinine improved to normal,  Bicarbonate was 8 and has improved to 18 with hydration.  Pt was initially placed on sensitive sliding scale with was increased to moderate and also started on levemir 4unit [place] qday. For hypertension, metoprolol, discontinued and pt was started on carvedilol which was titrated up to 6.9m po bid.  Pt had ESR elevation which improved with abx.  Blood cultures were negative.  Pt has been receiving TPN due to severe protein calorie malnutrition.  Albumin is improving slowly.  Pt has a skin tear on left buttock.sacral  area.  4cm.  Pt will be discharged back to SNF  Consults: none  Significant Diagnostic Studies: radiology: CXR,  Labs  Treatments: IV hydration, Abx  Discharge Exam: Blood pressure 140/70, pulse 88, temperature 98 F (36.7 C), temperature source Oral, resp. rate 18, height 5' 3.5" (1.613 m), weight 90.9 kg (200 lb 6.4 oz), SpO2 96 %.  Heent" anicteric Neck: no jvd Heart: rrr s1, s2 Lung: ctab Abd: soft, nt, nd, +_bs Ext: no c/c/ trace edema.  Skin: 4cm skin tear left sacral / buttock area   Sepsis secondary to uti, ? hcap,  Finished abx  UTI Completed abx  Hcap:  finished vanco, cont ceftazidime, levaquin D#7   ARF Improving Holding lasix Check cmp in 2 days  Anemia Stable after having had transfusion of 2 units prbc  CHF EF(35- 40%) Not in chf clinically  Metabolic acidosis Improving, hydrate gently with ns iv at 50 mL per hour x 12 more hours while at SNF  Dm2 Cont fsbs , change to q4h Please sure ssi.  Cont on  levemir 4 units [place] qday  Hypertension Cont  carvedilol 6.211mpo bid  Depression  Increase zoloft to 10031mo qday  Pressure ulcer stable  FEN TPN temporarily since she has such severe protein calorie malnutrition and this will also improve her wound healing, no peg due to fact that she would be at risk of pulling out peg  Delirium due to infection appears to be improving  Disposition: 03-Skilled Nursing Facility  Medication List    STOP taking these medications        ezetimibe 10 MG tablet  Commonly known as:  ZETIA     furosemide 10 MG/ML injection  Commonly known as:  LASIX     furosemide 40 MG tablet  Commonly known as:  LASIX     guaiFENesin 600 MG 12 hr tablet  Commonly known as:  MUCINEX     hydrALAZINE 10 MG tablet  Commonly known as:  APRESOLINE     MELATONEX PO     Methylfol-Methylcob-Acetylcyst 6-2-600 MG Tabs     metoprolol succinate 25 MG 24 hr tablet  Commonly known as:  TOPROL-XL      omeprazole 20 MG capsule  Commonly known as:  PRILOSEC     ondansetron 4 MG tablet  Commonly known as:  ZOFRAN     polyvinyl alcohol 1.4 % ophthalmic solution  Commonly known as:  LIQUIFILM TEARS     potassium chloride SA 20 MEQ tablet  Commonly known as:  K-DUR,KLOR-CON     senna-docusate 8.6-50 MG per tablet  Commonly known as:  Senokot-S     ZOSYN 3.375 GM/50ML IVPB  Generic drug:  piperacillin-tazobactam      TAKE these medications        acetaminophen 650 MG CR tablet  Commonly known as:  TYLENOL  Take 650 mg by mouth every 6 (six) hours as needed for pain.     albuterol (2.5 MG/3ML) 0.083% nebulizer solution  Commonly known as:  PROVENTIL  Take 3 mLs (2.5 mg total) by nebulization every 4 (four) hours as needed for wheezing.     atorvastatin 20 MG tablet  Commonly known as:  LIPITOR  Take 20 mg by mouth daily.     camphor-menthol lotion  Commonly known as:  SARNA  Apply topically as needed for itching.     carvedilol 6.25 MG tablet  Commonly known as:  COREG  Take 1 tablet (6.25 mg total) by mouth 2 (two) times daily with a meal.     cholecalciferol 1000 UNITS tablet  Commonly known as:  VITAMIN D  Take 1,000 Units by mouth daily.     famotidine 20 MG tablet  Commonly known as:  PEPCID  Take 1 tablet (20 mg total) by mouth daily.     feeding supplement (GLUCERNA SHAKE) Liqd  Take 237 mLs by mouth 3 (three) times daily between meals.     feeding supplement (PRO-STAT SUGAR FREE 64) Liqd  Take 30 mLs by mouth 3 (three) times daily with meals.     insulin aspart 100 UNIT/ML injection  Commonly known as:  novoLOG  Inject 0-15 Units into the skin every 4 (four) hours.     insulin detemir 100 UNIT/ML injection  Commonly known as:  LEVEMIR  Inject 0.04 mLs (4 Units total) into the skin daily.     loratadine 10 MG tablet  Commonly known as:  CLARITIN  Take 1 tablet (10 mg total) by mouth daily.     LORazepam 0.5 MG tablet  Commonly known as:  ATIVAN   Take 1 tablet (0.5 mg total) by mouth every 6 (six) hours as needed for anxiety.     sertraline 100 MG tablet  Commonly known as:  ZOLOFT  Take 1 tablet (100 mg total) by mouth daily.     sodium bicarbonate 650 MG tablet  Take 1 tablet (650 mg total) by mouth 3 (three) times daily.       Follow-up Information  Follow up with SNF physician or NP  In 2 days.      SignedJani Gravel 01/07/2015, 9:25 PM

## 2015-01-06 NOTE — Clinical Social Work Note (Signed)
CSW updated [facility] on pt. Aware pt is on TPN and can do this at SNF as well. Pt to have PICC line placed today.   Derenda FennelKara Jaken Fregia, LCSW [phone]

## 2015-01-06 NOTE — Progress Notes (Signed)
Peripherally Inserted Central Catheter/Midline Placement  The IV Nurse has discussed with the patient and/or persons authorized to consent for the patient, the purpose of this procedure and the potential benefits and risks involved with this procedure.  The benefits include less needle sticks, lab draws from the catheter and patient may be discharged home with the catheter.  Risks include, but not limited to, infection, bleeding, blood clot (thrombus formation), and puncture of an artery; nerve damage and irregular heat beat.  Alternatives to this procedure were also discussed.  PICC/Midline Placement Documentation  PICC / Midline Single Lumen 10/29/14 PICC Left Basilic 43 cm 0 cm (Active)     PICC / Midline Single Lumen 01/06/15 PICC Left Brachial 44 cm 0 cm (Active)  Indication for Insertion or Continuance of Line Home intravenous therapies (PICC only) 01/06/2015  1:44 PM  Exposed Catheter (cm) 0 cm 01/06/2015  1:44 PM  Site Assessment Clean;Dry;Intact 01/06/2015  1:44 PM  Line Status Saline locked;Blood return noted;Flushed 01/06/2015  1:44 PM  Dressing Type Transparent 01/06/2015  1:44 PM  Dressing Status Clean;Dry;Intact;Antimicrobial disc in place 01/06/2015  1:44 PM  Line Care Connections checked and tightened 01/06/2015  1:44 PM  Dressing Intervention New dressing 01/06/2015  1:44 PM  Dressing Change Due 01/13/15 01/06/2015  1:44 PM       Daleen SquibbGibson, Laterra Lubinski Lynn 01/06/2015, 1:46 PM

## 2015-01-07 LAB — GLUCOSE, CAPILLARY
Glucose-Capillary: 155 mg/dL — ABNORMAL HIGH (ref 65–99)
Glucose-Capillary: 173 mg/dL — ABNORMAL HIGH (ref 65–99)
Glucose-Capillary: 175 mg/dL — ABNORMAL HIGH (ref 65–99)
Glucose-Capillary: 179 mg/dL — ABNORMAL HIGH (ref 65–99)
Glucose-Capillary: 193 mg/dL — ABNORMAL HIGH (ref 65–99)

## 2015-01-07 LAB — CBC WITH DIFFERENTIAL/PLATELET
Basophils Absolute: 0 10*3/uL (ref 0.0–0.1)
Basophils Relative: 0 % (ref 0–1)
Eosinophils Absolute: 1 10*3/uL — ABNORMAL HIGH (ref 0.0–0.7)
Eosinophils Relative: 7 % — ABNORMAL HIGH (ref 0–5)
HCT: 30.4 % — ABNORMAL LOW (ref 36.0–46.0)
HEMOGLOBIN: 10.2 g/dL — AB (ref 12.0–15.0)
LYMPHS PCT: 13 % (ref 12–46)
Lymphs Abs: 1.9 10*3/uL (ref 0.7–4.0)
MCH: 29.7 pg (ref 26.0–34.0)
MCHC: 33.6 g/dL (ref 30.0–36.0)
MCV: 88.6 fL (ref 78.0–100.0)
MONO ABS: 1 10*3/uL (ref 0.1–1.0)
Monocytes Relative: 7 % (ref 3–12)
Neutro Abs: 10.4 10*3/uL — ABNORMAL HIGH (ref 1.7–7.7)
Neutrophils Relative %: 73 % (ref 43–77)
Platelets: 285 10*3/uL (ref 150–400)
RBC: 3.43 MIL/uL — AB (ref 3.87–5.11)
RDW: 19.5 % — AB (ref 11.5–15.5)
WBC: 14.3 10*3/uL — ABNORMAL HIGH (ref 4.0–10.5)

## 2015-01-07 LAB — COMPREHENSIVE METABOLIC PANEL
ALK PHOS: 95 U/L (ref 38–126)
ALT: 13 U/L — ABNORMAL LOW (ref 14–54)
ANION GAP: 5 (ref 5–15)
AST: 23 U/L (ref 15–41)
Albumin: 1.8 g/dL — ABNORMAL LOW (ref 3.5–5.0)
BUN: 50 mg/dL — ABNORMAL HIGH (ref 6–20)
CO2: 20 mmol/L — ABNORMAL LOW (ref 22–32)
Calcium: 9.6 mg/dL (ref 8.9–10.3)
Chloride: 111 mmol/L (ref 101–111)
Creatinine, Ser: 0.74 mg/dL (ref 0.44–1.00)
GFR calc Af Amer: >60 mL/min (ref >60)
GFR calc non Af Amer: >60 mL/min (ref >60)
GLUCOSE: 161 mg/dL — AB (ref 65–99)
Potassium: 4.4 mmol/L (ref 3.5–5.1)
SODIUM: 136 mmol/L (ref 135–145)
TOTAL PROTEIN: 5.2 g/dL — AB (ref 6.5–8.1)
Total Bilirubin: 0.4 mg/dL (ref 0.3–1.2)

## 2015-01-07 LAB — CLOSTRIDIUM DIFFICILE BY PCR: Toxigenic C. Difficile by PCR: NEGATIVE

## 2015-01-07 LAB — SEDIMENTATION RATE: SED RATE: 85 mm/h — AB (ref 0–22)

## 2015-01-07 NOTE — Care Management Note (Signed)
Case Management Note  Patient Details  Name: Tina [name] MRN: [id number] Date of Birth: 07/06/1930  Subjective/Objective:                    Action/Plan:   Expected Discharge Date:                  Expected Discharge Plan:  Skilled Nursing Facility  In-House Referral:  Clinical Social Work  Discharge planning Services  CM Consult  Post Acute Care Choice:  NA Choice offered to:  NA  DME Arranged:    DME Agency:     HH Arranged:    HH Agency:     Status of Service:  Completed, signed off  Medicare Important Message Given:  Yes-second notification given Date Medicare IM Given:    Medicare IM give by:    Date Additional Medicare IM Given:    Additional Medicare Important Message give by:     If discussed at Long Length of Stay Meetings, dates discussed:    Additional Comments: Pt discharged back to [facility] today. CSW to arrange discharge to facility. Arlyss QueenBlackwell, Evalie Hargraves Crockettrowder, RN 01/07/2015, 2:22 PM

## 2015-01-07 NOTE — Care Management Important Message (Signed)
Important Message  Patient Details  Name: Tina [name] MRN: [id number] Date of Birth: 12/03/1930   Medicare Important Message Given:  Yes-third notification given    Cheryl FlashBlackwell, Doree Kuehne Crowder, RN 01/07/2015, 2:23 PM

## 2015-01-07 NOTE — Progress Notes (Signed)
[facility] [place] [facility] All questions were answered and no further questions at this time.

## 2015-01-07 NOTE — Progress Notes (Signed)
Attempted to give report to [facility]. Informed by Kirt BoysMolly that the nurse will return my call.

## 2015-01-07 NOTE — Progress Notes (Signed)
Pt CBG 42. Pt offered 4 oz regular soda. Pt asymptomatic. Will recheck CBG and continue to monitor patient.

## 2015-01-07 NOTE — Progress Notes (Signed)
PT Cancellation Note  Patient Details Name: Tina CoopShirley A Lasorsa MRN: [id number] DOB: 12/01/1930   Cancelled Treatment:    Reason Eval/Treat Not Completed: Patient not medically ready;Fatigue/lethargy limiting ability to participate. Chart reviewed, RN consulted, has requested PT evaluation be held at this time due to pt distress and recent loose stool. Will attempt at later date/time.    Rayleen Wyrick C 01/07/2015, 1:22 PM  1:22 PM  Rosamaria LintsAllan C Imran Nuon, PT, DPT [place] License # [id number]

## 2015-01-07 NOTE — Progress Notes (Signed)
Pt discharged to [facility]. Pt escorted by [facility]. Pt in stable condition and in no acute distress at time of discharge.

## 2015-01-07 NOTE — Clinical Social Work Note (Addendum)
Pt d/c today back to [facility]. Facility aware and agreeable. CSW notified daughter, [name] by [employer]. Pt's brother aware and agreeable. [facility] to [facility]. D/C summary faxed.   Derenda FennelKara Edgar Corrigan, LCSW [phone]

## 2015-01-09 LAB — FECAL LACTOFERRIN, QUANT: Fecal Lactoferrin: POSITIVE

## 2015-01-09 NOTE — Patient Outreach (Signed)
  [facility] Appears patient discharged to [facility]F.   Raiford NobleAtika Hall, MSN-Ed, RN,BSN [facility] Hospital Liaison [phone]

## 2015-01-11 LAB — STOOL CULTURE

## 2015-05-28 DEATH — deceased

## 2016-09-03 IMAGING — MR MR LUMBAR SPINE W/O CM
4 of 6 series · 14 of 48 positions shown · non-contrast
Comparison: Radiography 08/19/2014.  MRI 03/04/2014.

CLINICAL DATA: Left leg pain. Lower extremity weakness. Previous
lumbar decompression. Multiple falls at home in the last week.

EXAM:
MRI LUMBAR SPINE WITHOUT CONTRAST
TECHNIQUE: Multiplanar, multisequence MR imaging of the lumbar spine was
performed. No intravenous contrast was administered.

[Series 3: T2 · sagittal · 4.0mm · 0.62mm/px · 5 of 13 slices shown (1 of 3)]
[im 1/13]
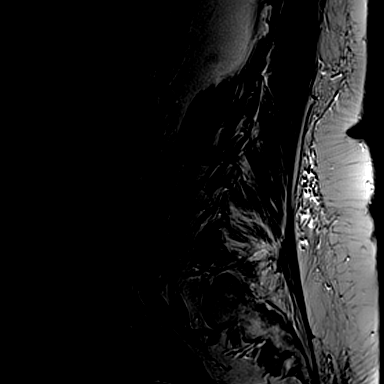
[im 4/13]
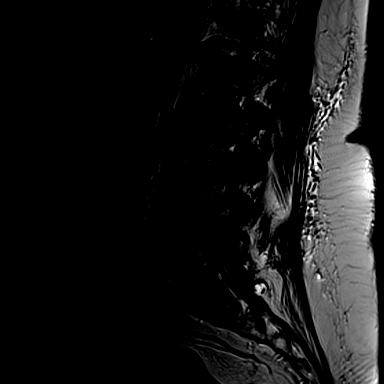
[im 7/13]
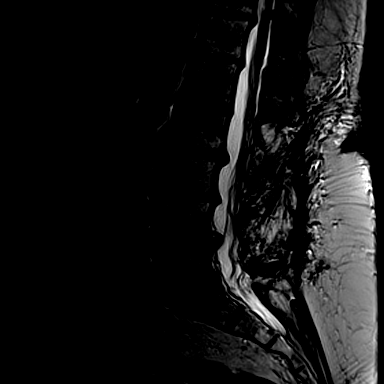
[im 10/13]
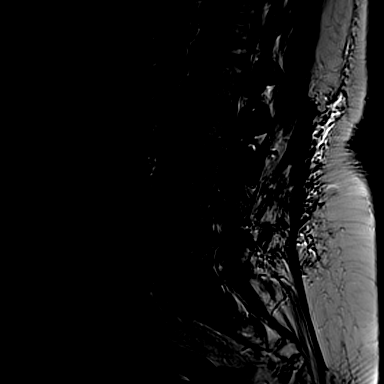
[im 13/13]
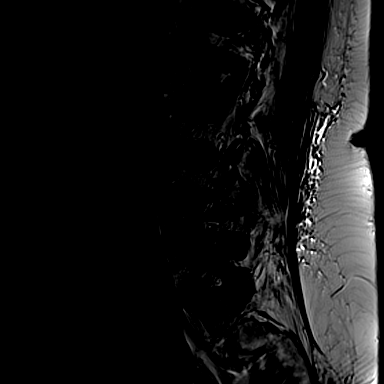

[Series 4: T1 · sagittal · 4.0mm · 0.31mm/px · 3 of 13 slices shown]
[im 1/13]
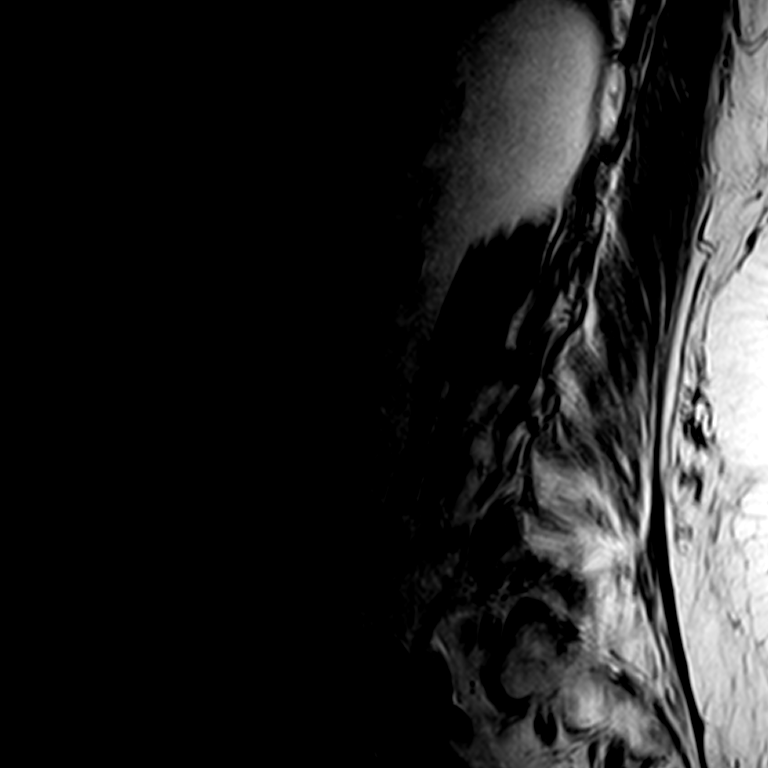
[im 9/13]
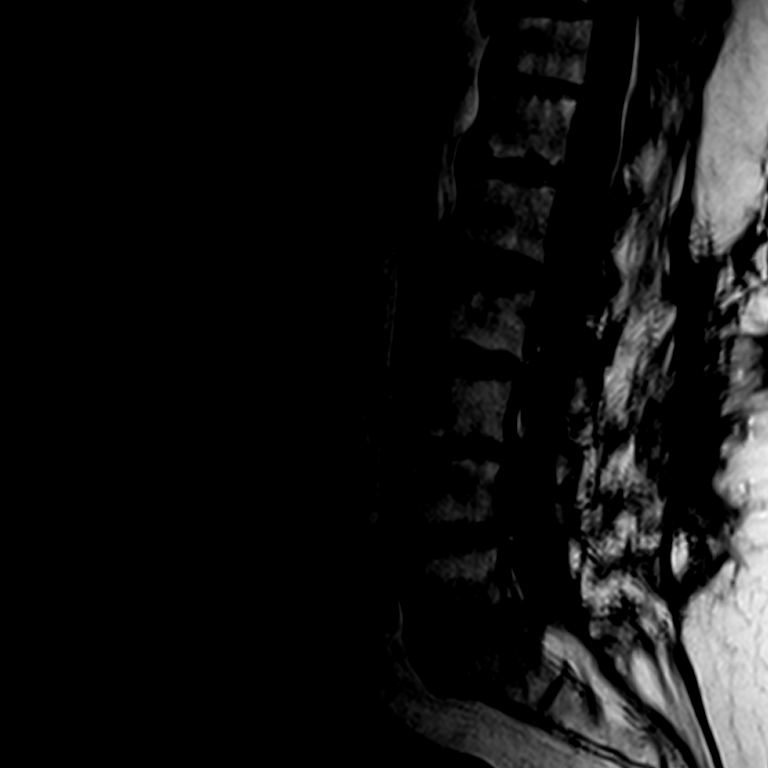
[im 13/13]
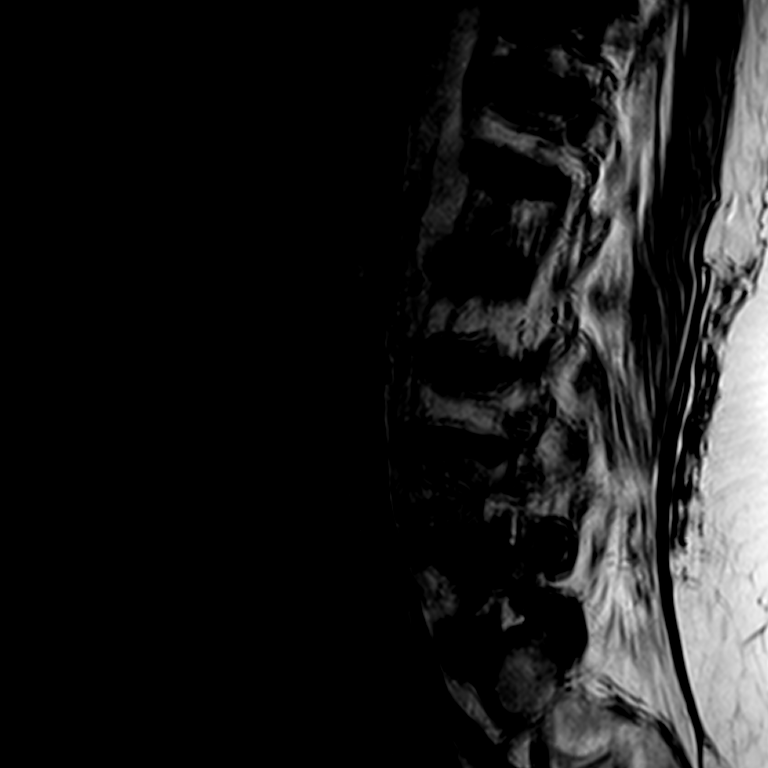

[Series 5: T2 · sagittal · 4.0mm · 0.37mm/px · 3 of 20 slices shown (2 of 3)]
[im 4/20]
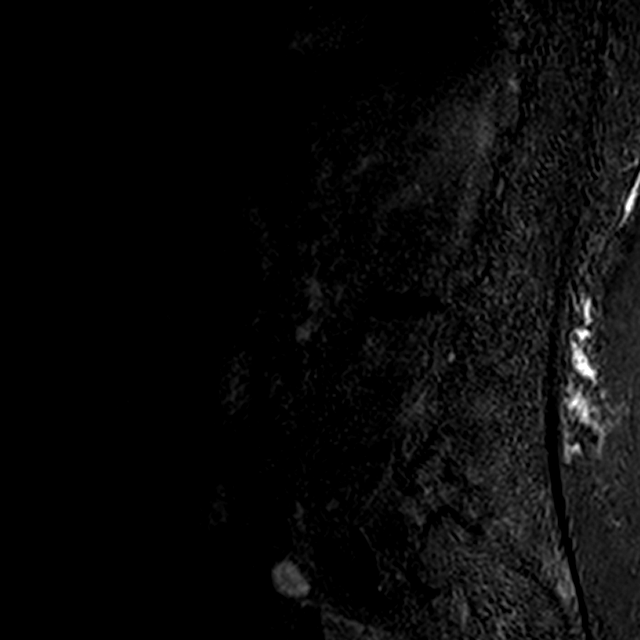
[im 10/20]
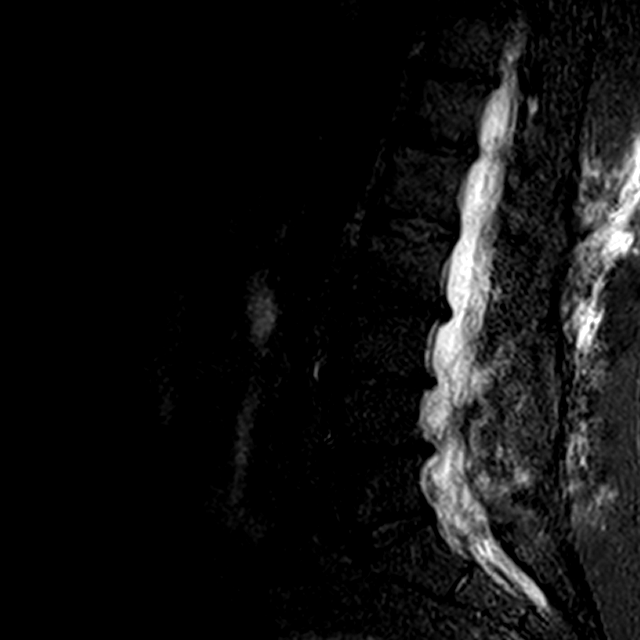
[im 16/20]
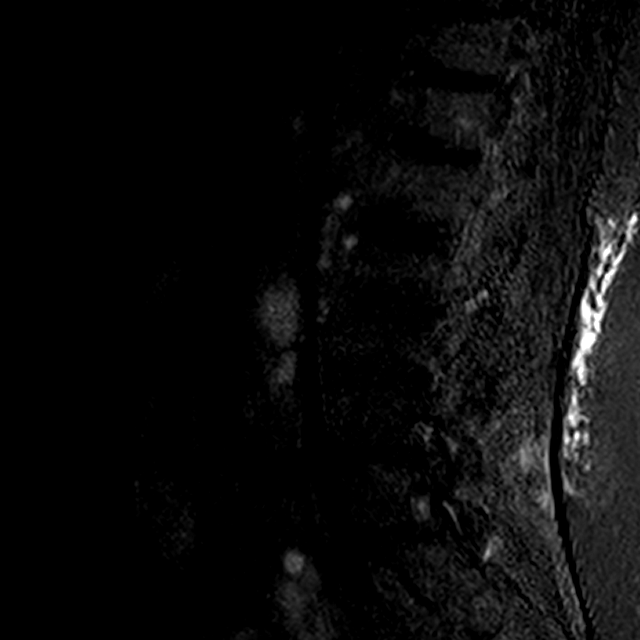

[Series 6: T2 · axial · 4.0mm · 0.25mm/px · z∈[-35,+117]mm · 3 of 43 slices shown (3 of 3)]
[im 7/43]
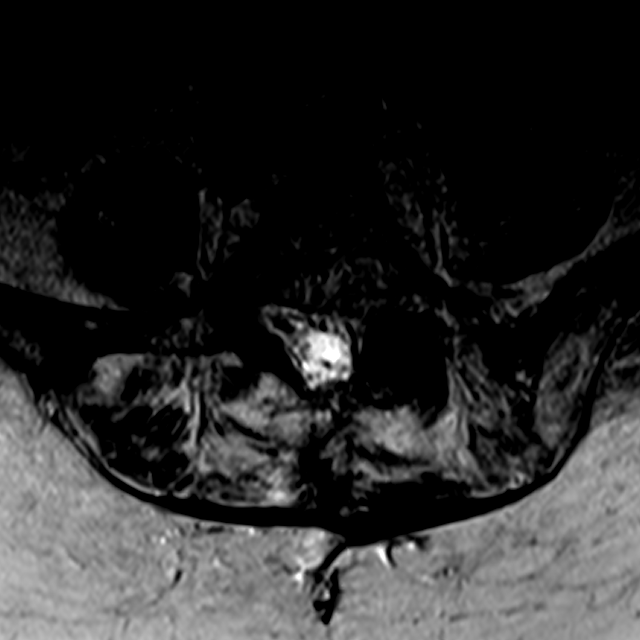
[im 23/43]
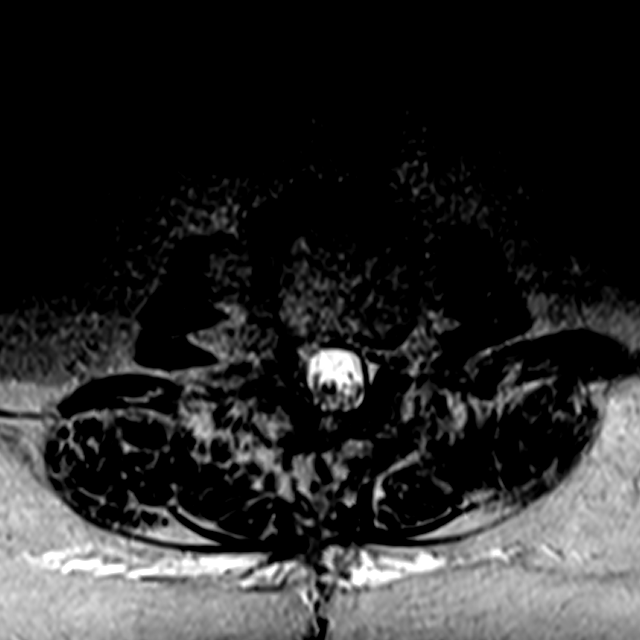
[im 36/43]
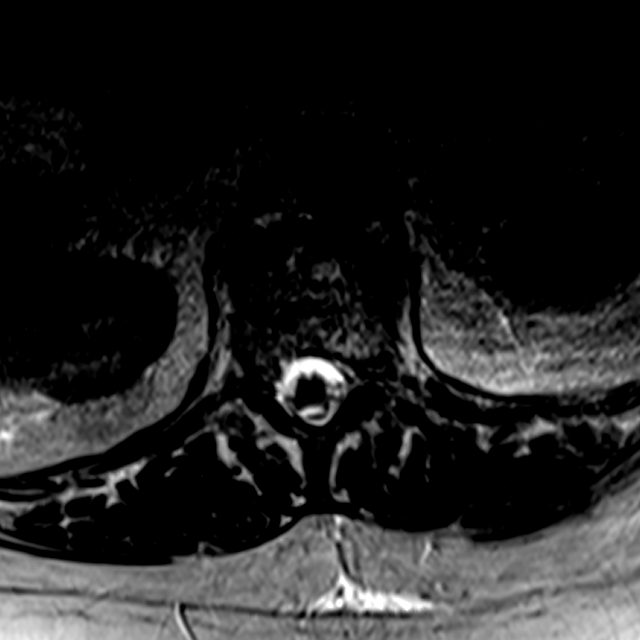

[14 of 48 positions shown; findings below may reference images not displayed]

FINDINGS: There is mild curvature convex to the left with the apex at L3.

T11-12:  Small disc bulge without neural compression.

T12-L1: Small disc bulge without neural compression.

L1-2:  Normal interspace.

L2-3: Previous posterior decompression. Endplate osteophytes and
bulging of the disc. Mild narrowing of the lateral recesses without
gross neural compression. Mild foraminal stenosis bilaterally.

L3-4: Previous posterior decompression. Endplate osteophytes and
mild bulging of the disc. Mild stenosis of both neural foramina
without gross neural compression.

L4-5: Previous posterior decompression. Endplate osteophytes and
shallow protrusion of disc material. Narrowing of the lateral
recesses. Foraminal narrowing left more than right.

L5-S1: Previous posterior decompression. Minimal bulging of the
disc. Mild facet degeneration. No significant stenosis.

Compared to the previous study, no change is seen since February 2014.
IMPRESSION: No change since February 2014. Posterior decompression at L2-3,
L3-4, L4-5 and L5-S1. No compressive central canal stenosis.
Narrowing of the lateral recesses and foramina at L4-5 could be
symptomatic, but is unchanged since 3894. Other less pronounced
degenerative changes as outlined above.
